# Patient Record
Sex: Male | Born: 1975 | Race: Black or African American | Hispanic: No | Marital: Single | Smoking: Current every day smoker
Health system: Southern US, Community
[De-identification: ages and names within clinical notes are randomized; demographics above are authoritative.]

## PROBLEM LIST (undated history)

## (undated) ENCOUNTER — Emergency Department (HOSPITAL_COMMUNITY): Discharge status: Absent without leave | Payer: Self-pay

## (undated) ENCOUNTER — Emergency Department (HOSPITAL_COMMUNITY): Admission: EM | Payer: Self-pay | Source: Home / Self Care

## (undated) DIAGNOSIS — Z5900 Homelessness unspecified: Secondary | ICD-10-CM

## (undated) DIAGNOSIS — F419 Anxiety disorder, unspecified: Secondary | ICD-10-CM

## (undated) DIAGNOSIS — F1410 Cocaine abuse, uncomplicated: Secondary | ICD-10-CM

## (undated) DIAGNOSIS — Z720 Tobacco use: Secondary | ICD-10-CM

## (undated) DIAGNOSIS — G43909 Migraine, unspecified, not intractable, without status migrainosus: Secondary | ICD-10-CM

---

## 2011-05-10 ENCOUNTER — Encounter (HOSPITAL_COMMUNITY): Payer: Self-pay | Admitting: *Deleted

## 2011-05-10 ENCOUNTER — Other Ambulatory Visit: Payer: Self-pay

## 2011-05-10 ENCOUNTER — Emergency Department (HOSPITAL_COMMUNITY)
Admission: EM | Admit: 2011-05-10 | Discharge: 2011-05-10 | Disposition: A | Discharge status: Home / Self Care | Payer: Self-pay | Attending: Emergency Medicine | Admitting: Emergency Medicine

## 2011-05-10 DIAGNOSIS — R079 Chest pain, unspecified: Secondary | ICD-10-CM | POA: Insufficient documentation

## 2011-05-10 DIAGNOSIS — F411 Generalized anxiety disorder: Secondary | ICD-10-CM | POA: Insufficient documentation

## 2011-05-10 DIAGNOSIS — J45909 Unspecified asthma, uncomplicated: Secondary | ICD-10-CM | POA: Insufficient documentation

## 2011-05-10 DIAGNOSIS — F419 Anxiety disorder, unspecified: Secondary | ICD-10-CM

## 2011-05-10 HISTORY — DX: Anxiety disorder, unspecified: F41.9

## 2011-05-10 MED ORDER — NAPROXEN 500 MG PO TABS: 500.0000 mg | ORAL_TABLET | Freq: Two times a day (BID) | ORAL | Status: DC

## 2011-05-10 NOTE — ED Notes (Signed)
G[facility]15 PEERL- pain 8/10

## 2011-05-10 NOTE — ED Notes (Signed)
Bed:WA25<BR> Expected date:<BR> Expected time:<BR> Means of arrival:<BR> Comments:<BR>
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
## 2011-05-10 NOTE — ED Notes (Signed)
Patient states he feels better. Patient requested for note for work . No noted or verbal distress at this time.

## 2011-05-10 NOTE — Discharge Instructions (Signed)
 RESOURCE GUIDE  Dental Problems  Patients with Medicaid: [facility]                     [facility] [address]                                           [address] Phone:  [phone]                                                  Phone:  [phone]  If unable to pay or uninsured, contact:  [facility] or [facility] to become qualified for the adult dental clinic.  Chronic Pain Problems Contact [facility]  [phone] Patients need to be referred by their primary care doctor.  Insufficient Money for Medicine Contact [employer]:  call "211" or [facility] 271-5999.  No Primary Care Doctor Call [facility]  832-8000 Other agencies that provide inexpensive medical care    [facility]  [phone]    [facility]  [phone]    [facility]  [phone]    [facility]  [phone]    [facility]  [phone]    [facility]  [phone]  Psychological Services [facility]  [phone] [facility]  [phone] [facility]   800 853-5163 (emergency services [phone])  Substance Abuse Resources [facility]  336-882-2125 [facility] 336-784-9470 [facility] 336-285-9073 [facility] 336-845-3988 Residential & Outpatient Substance Abuse Program  800-659-3381  Abuse/Neglect [place] Child Abuse Hotline (336) 641-3795 [place] Child Abuse Hotline 800-378-5315 (After Hours)  Emergency Shelter [facility] (336) 271-5985  [facility] (336) 275-9566 [facility] (704) 372-4663  MRSA Hotline #:   832-7006    [place] [facility]  [facility] [place]     [employer]                          [facility] [address] [place]                       [address]      [address]                                                  [place]                            [place] Phone:  [phone]                                   Phone:  [phone]                 Phone:  [phone]  [facility] Phone:    [phone]  [place] Child Abuse Hotline (336) 342-1394 (336) 342-3537 (After Hours)   

## 2011-08-14 ENCOUNTER — Encounter (HOSPITAL_COMMUNITY): Payer: Self-pay | Admitting: Emergency Medicine

## 2011-08-14 ENCOUNTER — Emergency Department (HOSPITAL_COMMUNITY)
Admission: EM | Admit: 2011-08-14 | Discharge: 2011-08-14 | Disposition: A | Discharge status: Home / Self Care | Payer: Self-pay | Attending: Emergency Medicine | Admitting: Emergency Medicine

## 2011-08-14 DIAGNOSIS — L84 Corns and callosities: Secondary | ICD-10-CM | POA: Insufficient documentation

## 2011-08-14 DIAGNOSIS — M79609 Pain in unspecified limb: Secondary | ICD-10-CM | POA: Insufficient documentation

## 2011-08-14 DIAGNOSIS — B351 Tinea unguium: Secondary | ICD-10-CM | POA: Insufficient documentation

## 2011-08-14 DIAGNOSIS — B353 Tinea pedis: Secondary | ICD-10-CM | POA: Insufficient documentation

## 2011-08-14 MED ORDER — MICONAZOLE NITRATE 2 % EX CREA: TOPICAL_CREAM | Freq: Two times a day (BID) | CUTANEOUS | Status: DC

## 2011-08-14 MED ORDER — TRAMADOL HCL 50 MG PO TABS
50.0000 mg | ORAL_TABLET | Freq: Once | ORAL | Status: AC
  Administered 2011-08-14: 50 mg via ORAL
  Filled 2011-08-14: qty 1

## 2011-08-14 NOTE — ED Notes (Signed)
Pt alert, nad, c/o foot pain, ? Athletes feet, onset unknown, ambulates to triage, steady gait noted, skin pwd

## 2011-08-14 NOTE — ED Notes (Signed)
MD at bedside. 

## 2011-08-14 NOTE — ED Provider Notes (Signed)
History     CSN: [id number]  Arrival date & time 08/14/11  0008   First MD Initiated Contact with Patient 08/14/11 0136      Chief Complaint  Patient presents with  . Foot Pain    (Consider location/radiation/quality/duration/timing/severity/associated sxs/prior treatment) Patient is a 36 y.o. male presenting with lower extremity pain. The history is provided by the patient. No language interpreter was used.  Foot Pain This is a new problem. The current episode started more than 1 week ago. The problem occurs constantly. The problem has not changed since onset.Pertinent negatives include no chest pain, no abdominal pain, no headaches and no shortness of breath. Nothing aggravates the symptoms. He has tried nothing for the symptoms. The treatment provided no relief.  Pt callouses on the feet and toenail fungus and skin cracking works on feet.    Past Medical History  Diagnosis Date  . Anxiety   . Asthma     History reviewed. No pertinent past surgical history.  No family history on file.  History  Substance Use Topics  . Smoking status: Not on file  . Smokeless tobacco: Not on file  . Alcohol Use: No      Review of Systems  Respiratory: Negative for shortness of breath.   Cardiovascular: Negative for chest pain.  Gastrointestinal: Negative for abdominal pain.  Skin: Negative for rash.  Neurological: Negative for headaches.  All other systems reviewed and are negative.    Allergies  Review of patient's allergies indicates no known allergies.  Home Medications   Current Outpatient Rx  Name Route Sig Dispense Refill  . ACETAMINOPHEN 500 MG PO TABS Oral Take 500 mg by mouth every 6 (six) hours as needed.      BP 122/91  Pulse 64  Temp 98 F (36.7 C) (Oral)  Resp 16  Wt 155 lb (70.308 kg)  SpO2 99%  Physical Exam  Constitutional: He is oriented to person, place, and time. He appears well-developed and well-nourished.  HENT:  Head: Normocephalic and  atraumatic.  Mouth/Throat: Oropharynx is clear and moist.  Eyes: Conjunctivae are normal. Pupils are equal, round, and reactive to light.  Neck: Normal range of motion. Neck supple.  Cardiovascular: Normal rate and regular rhythm.   Pulmonary/Chest: Effort normal and breath sounds normal.  Abdominal: Soft. Bowel sounds are normal. There is no tenderness. There is no rebound and no guarding.  Musculoskeletal: Normal range of motion.       2+ dorsalis pedis Bm FROM of B feet, B feet sensation intact  Neurological: He is alert and oriented to person, place, and time.  Skin: Skin is warm and dry.       Toenail fungus on all toes and callus on B plantar feet.  Tinea pedis between toes  Psychiatric: He has a normal mood and affect.    ED Course  Procedures (including critical care time)  Labs Reviewed - No data to display No results found.   No diagnosis found.    MDM  Follow up with podiatry for ongoing care.  Patient verbalizes understanding and agrees to follow up       Brax Walen Smitty Cords, MD 08/14/11 0150

## 2011-09-07 ENCOUNTER — Encounter (HOSPITAL_COMMUNITY): Payer: Self-pay | Admitting: *Deleted

## 2011-09-07 ENCOUNTER — Emergency Department (HOSPITAL_COMMUNITY)
Admission: EM | Admit: 2011-09-07 | Discharge: 2011-09-08 | Disposition: A | Discharge status: Home / Self Care | Payer: Self-pay | Attending: Emergency Medicine | Admitting: Emergency Medicine

## 2011-09-07 DIAGNOSIS — M79609 Pain in unspecified limb: Secondary | ICD-10-CM | POA: Insufficient documentation

## 2011-09-07 DIAGNOSIS — M79606 Pain in leg, unspecified: Secondary | ICD-10-CM

## 2011-09-07 NOTE — ED Notes (Signed)
Pt sts that he has had surgery on his legs.  

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
## 2011-09-08 MED ORDER — KETOROLAC TROMETHAMINE 60 MG/2ML IM SOLN
60.0000 mg | Freq: Once | INTRAMUSCULAR | Status: DC
  Filled 2011-09-08: qty 2

## 2011-09-08 MED ORDER — NAPROXEN 375 MG PO TABS: 375.0000 mg | ORAL_TABLET | Freq: Two times a day (BID) | ORAL | Status: DC

## 2011-09-08 NOTE — ED Provider Notes (Signed)
Medical screening examination/treatment/procedure(s) were conducted as a shared visit with non-physician practitioner(s) and myself.  I personally evaluated the patient during the encounter  Right calf tenderness without tenseness, erythema or warmth.  Hanley Seamen, MD 09/08/11 0120

## 2011-09-08 NOTE — ED Provider Notes (Signed)
History     CSN: [id number]  Arrival date & time 09/07/11  1949   First MD Initiated Contact with Patient 09/08/11 0023      Chief Complaint  Patient presents with  . Leg Pain    (Consider location/radiation/quality/duration/timing/severity/associated sxs/prior treatment) HPI Comments: Patient presents emergency department with chief complaint of right leg pain.  He's and not a reliable historian and states that he has had surgery on that leg but is unsure what type of surgery and for what.  He is also on aware of when the pain began stating once that it began today acutely and then later her that it began a couple of days ago.  He originally described the pain as severe and later as crampy.  Patient originally denied any injury or trauma and later stated that he felt a popping in his legs but is unable to state when the popping occurred and then again denied any popping injury in the same paragraph. Pt has no other complaints at this time.   Patient is a 36 y.o. male presenting with leg pain. The history is provided by the patient.  Leg Pain     Past Medical History  Diagnosis Date  . Anxiety   . Asthma     History reviewed. No pertinent past surgical history.  No family history on file.  History  Substance Use Topics  . Smoking status: Not on file  . Smokeless tobacco: Not on file  . Alcohol Use: No      Review of Systems  All other systems reviewed and are negative.    Allergies  Review of patient's allergies indicates no known allergies.  Home Medications   Current Outpatient Rx  Name Route Sig Dispense Refill  . ACETAMINOPHEN 500 MG PO TABS Oral Take 500 mg by mouth every 6 (six) hours as needed.      BP 126/85  Pulse 49  Resp 22  Ht 5\' 7"  (1.702 m)  Wt 140 lb (63.504 kg)  BMI 21.93 kg/m2  SpO2 100%  Physical Exam  Nursing note and vitals reviewed. Constitutional: He is oriented to person, place, and time. He appears well-developed and  well-nourished. No distress.  HENT:  Head: Normocephalic and atraumatic.  Eyes: Conjunctivae and EOM are normal.  Neck: Normal range of motion.  Cardiovascular:       Intact distal pulses.  No edema.  Pulmonary/Chest: Effort normal.  Musculoskeletal: Normal range of motion.       Homans sign negative.  Mild tenderness to palpation of the popliteal area.  Patient ambulates without difficulty.  Neurological: He is alert and oriented to person, place, and time.  Skin: Skin is warm and dry. No rash noted. He is not diaphoretic.       No warmth erythema or swelling of lower extremity.  Psychiatric: He has a normal mood and affect. His behavior is normal.    ED Course  Procedures (including critical care time)  Labs Reviewed - No data to display No results found.   No diagnosis found.    MDM  Leg pain  Patient presents emergency department with chief complaint of right lower extremity pain, however he is a unreliable historian and it is unclear as to what type of pain he is having.  Patient treated in the emergency department with anti-inflammatories advised to followup with primary care physician in regards to symptoms.  Patient's presentation is not concerning for DVT.  Patient extremity is not swollen, no pitting edema,  no warmth or erythema.  Full range of motion intact.  Patient able to ambulate in emergency department.  Return precautions discussed.        Jaci Carrel, [place] 09/08/11 0116

## 2012-04-25 ENCOUNTER — Encounter (HOSPITAL_COMMUNITY): Payer: Self-pay | Admitting: *Deleted

## 2012-04-25 ENCOUNTER — Emergency Department (HOSPITAL_COMMUNITY)
Admission: EM | Admit: 2012-04-25 | Discharge: 2012-04-25 | Disposition: A | Discharge status: Home / Self Care | Payer: Self-pay | Attending: Emergency Medicine | Admitting: Emergency Medicine

## 2012-04-25 DIAGNOSIS — X31XXXA Exposure to excessive natural cold, initial encounter: Secondary | ICD-10-CM | POA: Insufficient documentation

## 2012-04-25 DIAGNOSIS — T69029A Immersion foot, unspecified foot, initial encounter: Secondary | ICD-10-CM | POA: Insufficient documentation

## 2012-04-25 DIAGNOSIS — Z5900 Homelessness unspecified: Secondary | ICD-10-CM | POA: Insufficient documentation

## 2012-04-25 DIAGNOSIS — J45909 Unspecified asthma, uncomplicated: Secondary | ICD-10-CM | POA: Insufficient documentation

## 2012-04-25 DIAGNOSIS — Z8659 Personal history of other mental and behavioral disorders: Secondary | ICD-10-CM | POA: Insufficient documentation

## 2012-04-25 DIAGNOSIS — Y939 Activity, unspecified: Secondary | ICD-10-CM | POA: Insufficient documentation

## 2012-04-25 DIAGNOSIS — Y929 Unspecified place or not applicable: Secondary | ICD-10-CM | POA: Insufficient documentation

## 2012-04-25 NOTE — ED Provider Notes (Signed)
History     CSN: [id number]  Arrival date & time 04/25/12  0006   First MD Initiated Contact with Patient 04/25/12 0430      Chief Complaint  Patient presents with  . Foot Pain   HPI  History provided by the patient. Patient is 37 year old male currently homeless who presents with complaints of bilateral feet pain and swelling. Patient reports having increasing pain and swelling to his feet and toes for the past few days. Patient states they he really wishes he had his tennis shoes to wear but has been wearing boots. He states he has been trying to keep his feet dry but has not really been taking much care of them and has been walking continuously. He complains of pain to the bottoms of his feet and to his toes. He has not used any treatment for his symptoms. Denies any other aggravating or alleviating factors. Denies any associated red streaks or swelling of the foot. No fever, chills or sweats. Denies any other associated symptoms. Pain is described as moderate.    Past Medical History  Diagnosis Date  . Anxiety   . Asthma     History reviewed. No pertinent past surgical history.  No family history on file.  History  Substance Use Topics  . Smoking status: Not on file  . Smokeless tobacco: Not on file  . Alcohol Use: No      Review of Systems  Constitutional: Negative for fever and chills.  Neurological: Negative for weakness and numbness.  All other systems reviewed and are negative.    Allergies  Review of patient's allergies indicates no known allergies.  Home Medications  No current outpatient prescriptions on file.  BP 112/82  Pulse 52  Temp(Src) 97.9 F (36.6 C) (Oral)  Resp 18  SpO2 100%  Physical Exam  Nursing note and vitals reviewed. Constitutional: He is oriented to person, place, and time. He appears well-developed and well-nourished. No distress.  HENT:  Head: Normocephalic.  Cardiovascular: Normal rate and regular rhythm.    Pulmonary/Chest: Effort normal and breath sounds normal.  Abdominal: Soft.  Musculoskeletal:  Mild erythema to the toes diffusely on both feet. There is moderate swelling as well. Skin is macerated and tender to palpation. There is no spreading erythema to the foot. No erythematous streaks. Normal pulses. Normal sensation to light touch.  Neurological: He is alert and oriented to person, place, and time.  Skin: Skin is warm.  Psychiatric: He has a normal mood and affect. His behavior is normal.    ED Course  Procedures       1. Trench feet, unspecified laterality, initial encounter       MDM  4:30 AM patient seen and evaluated. Patient resting and sleeping comfortable in bed. He does not appear in any acute distress.        Angus Seller, PA-C 04/25/12 2046

## 2012-04-25 NOTE — ED Notes (Signed)
Pt states has a sore right foot from walking; no obvious injury

## 2013-09-18 ENCOUNTER — Emergency Department (HOSPITAL_COMMUNITY)
Admission: EM | Admit: 2013-09-18 | Discharge: 2013-09-19 | Disposition: A | Discharge status: Home / Self Care | Payer: Self-pay | Attending: Emergency Medicine | Admitting: Emergency Medicine

## 2013-09-18 ENCOUNTER — Encounter (HOSPITAL_COMMUNITY): Payer: Self-pay | Admitting: Emergency Medicine

## 2013-09-18 DIAGNOSIS — R112 Nausea with vomiting, unspecified: Secondary | ICD-10-CM | POA: Insufficient documentation

## 2013-09-18 DIAGNOSIS — R109 Unspecified abdominal pain: Secondary | ICD-10-CM | POA: Insufficient documentation

## 2013-09-18 DIAGNOSIS — F17200 Nicotine dependence, unspecified, uncomplicated: Secondary | ICD-10-CM | POA: Insufficient documentation

## 2013-09-18 DIAGNOSIS — J45909 Unspecified asthma, uncomplicated: Secondary | ICD-10-CM | POA: Insufficient documentation

## 2013-09-18 DIAGNOSIS — Z8659 Personal history of other mental and behavioral disorders: Secondary | ICD-10-CM | POA: Insufficient documentation

## 2013-09-18 DIAGNOSIS — R61 Generalized hyperhidrosis: Secondary | ICD-10-CM | POA: Insufficient documentation

## 2013-09-18 DIAGNOSIS — R1084 Generalized abdominal pain: Secondary | ICD-10-CM | POA: Insufficient documentation

## 2013-09-18 MED ORDER — SODIUM CHLORIDE 0.9 % IV BOLUS (SEPSIS): 1000.0000 mL | Freq: Once | INTRAVENOUS | Status: DC

## 2013-09-18 MED ORDER — ONDANSETRON 4 MG PO TBDP
4.0000 mg | ORAL_TABLET | Freq: Once | ORAL | Status: AC
  Administered 2013-09-19: 4 mg via ORAL
  Filled 2013-09-18: qty 1

## 2013-09-18 MED ORDER — MORPHINE SULFATE 4 MG/ML IJ SOLN
4.0000 mg | Freq: Once | INTRAMUSCULAR | Status: DC
  Filled 2013-09-18: qty 1

## 2013-09-18 MED ORDER — DICYCLOMINE HCL 20 MG PO TABS
10.0000 mg | ORAL_TABLET | Freq: Once | ORAL | Status: AC
  Administered 2013-09-19: 10 mg via ORAL
  Filled 2013-09-18: qty 1

## 2013-09-18 MED ORDER — ONDANSETRON HCL 4 MG/2ML IJ SOLN
4.0000 mg | Freq: Once | INTRAMUSCULAR | Status: DC
  Filled 2013-09-18: qty 2
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
## 2013-09-18 NOTE — ED Notes (Signed)
Refused blood draw. Dr. Ranae PalmsYelverton notified.

## 2013-09-18 NOTE — ED Notes (Signed)
Pt states he has abd pain and vomiting since about noon today   Pt states he thinks he ate something bad

## 2013-09-19 MED ORDER — ONDANSETRON 4 MG PO TBDP: ORAL_TABLET | ORAL | Status: DC

## 2013-09-19 MED ORDER — DICYCLOMINE HCL 20 MG PO TABS: 20.0000 mg | ORAL_TABLET | Freq: Two times a day (BID) | ORAL | Status: DC | PRN

## 2013-09-19 NOTE — Discharge Instructions (Signed)

## 2013-10-19 ENCOUNTER — Encounter (HOSPITAL_COMMUNITY): Payer: Self-pay | Admitting: Emergency Medicine

## 2013-10-19 ENCOUNTER — Emergency Department (HOSPITAL_COMMUNITY)
Admission: EM | Admit: 2013-10-19 | Discharge: 2013-10-19 | Disposition: A | Discharge status: Home / Self Care | Payer: Self-pay | Attending: Emergency Medicine | Admitting: Emergency Medicine

## 2013-10-19 DIAGNOSIS — F17200 Nicotine dependence, unspecified, uncomplicated: Secondary | ICD-10-CM | POA: Insufficient documentation

## 2013-10-19 DIAGNOSIS — R110 Nausea: Secondary | ICD-10-CM | POA: Insufficient documentation

## 2013-10-19 DIAGNOSIS — Z8659 Personal history of other mental and behavioral disorders: Secondary | ICD-10-CM | POA: Insufficient documentation

## 2013-10-19 DIAGNOSIS — J45909 Unspecified asthma, uncomplicated: Secondary | ICD-10-CM | POA: Insufficient documentation

## 2013-10-19 DIAGNOSIS — R42 Dizziness and giddiness: Secondary | ICD-10-CM | POA: Insufficient documentation

## 2013-10-19 DIAGNOSIS — R1084 Generalized abdominal pain: Secondary | ICD-10-CM | POA: Insufficient documentation

## 2013-10-19 LAB — CBC WITH DIFFERENTIAL/PLATELET
BASOS PCT: 0 % (ref 0–1)
Basophils Absolute: 0 10*3/uL (ref 0.0–0.1)
EOS ABS: 0.2 10*3/uL (ref 0.0–0.7)
EOS PCT: 3 % (ref 0–5)
HEMATOCRIT: 43.7 % (ref 39.0–52.0)
HEMOGLOBIN: 14.3 g/dL (ref 13.0–17.0)
LYMPHS ABS: 1.9 10*3/uL (ref 0.7–4.0)
LYMPHS PCT: 34 % (ref 12–46)
MCH: 28.7 pg (ref 26.0–34.0)
MCHC: 32.7 g/dL (ref 30.0–36.0)
MCV: 87.6 fL (ref 78.0–100.0)
Monocytes Absolute: 0.7 10*3/uL (ref 0.1–1.0)
Monocytes Relative: 12 % (ref 3–12)
NEUTROS ABS: 2.9 10*3/uL (ref 1.7–7.7)
Neutrophils Relative %: 51 % (ref 43–77)
Platelets: 249 10*3/uL (ref 150–400)
RBC: 4.99 MIL/uL (ref 4.22–5.81)
RDW: 13.7 % (ref 11.5–15.5)
WBC: 5.7 10*3/uL (ref 4.0–10.5)

## 2013-10-19 LAB — COMPREHENSIVE METABOLIC PANEL
ALBUMIN: 4.2 g/dL (ref 3.5–5.2)
ALT: 11 U/L (ref 0–53)
ANION GAP: 12 (ref 5–15)
AST: 16 U/L (ref 0–37)
Alkaline Phosphatase: 73 U/L (ref 39–117)
BUN: 11 mg/dL (ref 6–23)
CALCIUM: 9.5 mg/dL (ref 8.4–10.5)
CO2: 26 mEq/L (ref 19–32)
Chloride: 104 mEq/L (ref 96–112)
Creatinine, Ser: 0.91 mg/dL (ref 0.50–1.35)
GFR calc non Af Amer: >90 mL/min (ref >90)
GLUCOSE: 86 mg/dL (ref 70–99)
Potassium: 4.2 mEq/L (ref 3.7–5.3)
SODIUM: 142 meq/L (ref 137–147)
TOTAL PROTEIN: 7.5 g/dL (ref 6.0–8.3)
Total Bilirubin: 0.7 mg/dL (ref 0.3–1.2)

## 2013-10-19 LAB — URINE MICROSCOPIC-ADD ON

## 2013-10-19 LAB — URINALYSIS, ROUTINE W REFLEX MICROSCOPIC
Bilirubin Urine: NEGATIVE
Glucose, UA: NEGATIVE mg/dL
Hgb urine dipstick: NEGATIVE
Ketones, ur: NEGATIVE mg/dL
Nitrite: NEGATIVE
PROTEIN: NEGATIVE mg/dL
SPECIFIC GRAVITY, URINE: 1.022 (ref 1.005–1.030)
UROBILINOGEN UA: 1 mg/dL (ref 0.0–1.0)
pH: 6 (ref 5.0–8.0)

## 2013-10-19 LAB — LIPASE, BLOOD: Lipase: 18 U/L (ref 11–59)

## 2013-10-19 MED ORDER — SODIUM CHLORIDE 0.9 % IV BOLUS (SEPSIS)
1000.0000 mL | Freq: Once | INTRAVENOUS | Status: AC
  Administered 2013-10-19: 1000 mL via INTRAVENOUS

## 2013-10-19 MED ORDER — ONDANSETRON HCL 4 MG/2ML IJ SOLN
4.0000 mg | Freq: Once | INTRAMUSCULAR | Status: DC
  Filled 2013-10-19: qty 2

## 2013-10-19 NOTE — ED Notes (Signed)
Pt states he has been incarcerated for the past 30 days and just got out yesterday  Pt states he just hasnt been feeling good and wants to be checked out  Pt states he is having dizziness and intermittent abd pain with some nausea  Denies vomiting or diarrhea

## 2013-10-19 NOTE — ED Notes (Addendum)
Patient states "Since I got out, I just don't feel right." Patient relays vague symptoms of abd discomfort and being a little dizzy. Patient affect flat. Patient further expands to state that he is now homeless and "struggling". Patient states he attempted to go to a homeless shelter last night but was turned away and placed on a waiting list. Patient denies being familiar with [facility] and resources available. Patient denies nausea, states he is hungry, that all he has eaten since "he got out" was a couple bags of chips. Patient states he is not eating do to lack of resources.

## 2013-10-19 NOTE — ED Provider Notes (Signed)
CSN: [id number]     Arrival date & time 10/19/13  0531 History   First MD Initiated Contact with Patient 10/19/13 0557     Chief Complaint  Patient presents with  . Dizziness  . Abdominal Pain     (Consider location/radiation/quality/duration/timing/severity/associated sxs/prior Treatment) HPI Mr Langille is an 38 year old male with past medical history of anxiety and asthma who presents the ER today with nausea, and "not feeling well". Patient states he was just released from incarceration yesterday morning and since then has felt slightly nauseated, and states he "would just like to be checked out". Patient states he tried to eat some chips yesterday, which made him more nauseated. He states he has had low oral intake since yesterday and feels slightly "lightheaded". Patient denies any vomiting, fever, chills, shortness of breath, chest pain, weakness, numbness, tingling, syncope.  Past Medical History  Diagnosis Date  . Anxiety   . Asthma    History reviewed. No pertinent past surgical history. History reviewed. No pertinent family history. History  Substance Use Topics  . Smoking status: Current Every Day Smoker    Types: Cigarettes  . Smokeless tobacco: Not on file  . Alcohol Use: No    Review of Systems  Constitutional: Negative for fever.  HENT: Negative for trouble swallowing.   Eyes: Negative for visual disturbance.  Respiratory: Negative for shortness of breath.   Cardiovascular: Negative for chest pain.  Gastrointestinal: Positive for nausea and abdominal pain. Negative for vomiting.  Genitourinary: Negative for dysuria.  Musculoskeletal: Negative for neck pain.  Skin: Negative for rash.  Neurological: Negative for dizziness, weakness and numbness.  Psychiatric/Behavioral: Negative.       Allergies  Review of patient's allergies indicates no known allergies.  Home Medications   Prior to Admission medications   Not on File   BP 120/68  Pulse 68   Temp(Src) 98.8 F (37.1 C) (Oral)  Resp 16  Ht  (1.753 m)  Wt 150 lb (68.04 kg)  BMI 22.14 kg/m2  SpO2 99% Physical Exam  Nursing note and vitals reviewed. Constitutional: He is oriented to person, place, and time. He appears well-developed and well-nourished. No distress.  HENT:  Head: Normocephalic and atraumatic.  Mouth/Throat: Oropharynx is clear and moist. No oropharyngeal exudate.  Eyes: Right eye exhibits no discharge. Left eye exhibits no discharge. No scleral icterus.  Neck: Normal range of motion.  Cardiovascular: Normal rate, regular rhythm and normal heart sounds.   No murmur heard. Pulmonary/Chest: Effort normal and breath sounds normal. No respiratory distress.  Abdominal: Soft. Normal appearance and bowel sounds are normal. There is generalized tenderness. There is no rigidity, no guarding, no tenderness at McBurney's point and negative Murphy's sign.  Musculoskeletal: Normal range of motion. He exhibits no edema and no tenderness.  Neurological: He is alert and oriented to person, place, and time. No cranial nerve deficit. Coordination normal.  Skin: Skin is warm and dry. No rash noted. He is not diaphoretic.  Psychiatric: He has a normal mood and affect.    ED Course  Procedures (including critical care time) Labs Review Labs Reviewed  URINALYSIS, ROUTINE W REFLEX MICROSCOPIC - Abnormal; Notable for the following:    Leukocytes, UA MODERATE (*)    All other components within normal limits  URINE CULTURE  CBC WITH DIFFERENTIAL  COMPREHENSIVE METABOLIC PANEL  LIPASE, BLOOD  URINE MICROSCOPIC-ADD ON    Imaging Review No results found.   EKG Interpretation   Date/Time:  Friday October 19 2013 06:43:45  EDT Ventricular Rate:  59 PR Interval:  162 QRS Duration: 82 QT Interval:  425 QTC Calculation: 421 R Axis:   85 Text Interpretation:  Sinus rhythm Atrial premature complex RSR' in V1 or  V2, probably normal variant ST elev, probable normal early  repol pattern  Confirmed by Rhunette Croft, MD, Janey Genta [phone]) on 10/19/2013 7:39:19 AM      MDM   Final diagnoses:  Nausea alone    Patient with one day of nausea, generalized abdominal pain after being released from incarceration yesterday. Workup for abdominal etiology, possible gastroenteritis, pancreatitis, symptomatic treatment  7:00 AM: Patient stating he is no longer nauseous, and now he is hungry. We will hold Zofran.  10:25 AM: Patient's labs unremarkable. UA remarkable for moderate leukocytes only. Patient continues to deny dysuria at any time. We'll send urine culture. Patient tolerating food and drink well at this time, and is asymptomatic. We will discharge patient and have him followup with his primary care physician. I encouraged patient to call or return to the ER should his symptoms continue, return, or should he have any questions or concerns.  BP 120/68  Pulse 68  Temp(Src) 98.8 F (37.1 C) (Oral)  Resp 16  Ht  (1.753 m)  Wt 150 lb (68.04 kg)  BMI 22.14 kg/m2  SpO2 99%   Signed,  Ladona Mow, PA-C 6:58 PM   This patient seen and discussed with Dr. Derwood Kaplan, MD  Monte Fantasia, PA-C 10/19/13 1857  Monte Fantasia, PA-C 10/19/13 1859

## 2013-10-19 NOTE — Discharge Instructions (Signed)
Continue to drink plenty of fluids on daily basis, and eat regular meals. Return to the ER if symptoms persist, worsen or should you have any questions or concerns. Your urine was sent for culture, and those results will be related to you if positive for anything.  Nausea, Adult Nausea is the feeling that you have an upset stomach or have to vomit. Nausea by itself is not likely a serious concern, but it may be an early sign of more serious medical problems. As nausea gets worse, it can lead to vomiting. If vomiting develops, there is the risk of dehydration.  CAUSES   Viral infections.  Food poisoning.  Medicines.  Pregnancy.  Motion sickness.  Migraine headaches.  Emotional distress.  Severe pain from any source.  Alcohol intoxication. HOME CARE INSTRUCTIONS  Get plenty of rest.  Ask your caregiver about specific rehydration instructions.  Eat small amounts of food and sip liquids more often.  Take all medicines as told by your caregiver. SEEK MEDICAL CARE IF:  You have not improved after 2 days, or you get worse.  You have a headache. SEEK IMMEDIATE MEDICAL CARE IF:   You have a fever.  You faint.  You keep vomiting or have blood in your vomit.  You are extremely weak or dehydrated.  You have dark or bloody stools.  You have severe chest or abdominal pain. MAKE SURE YOU:  Understand these instructions.  Will watch your condition.  Will get help right away if you are not doing well or get worse. Document Released: 02/26/2004 Document Revised: 10/13/2011 Document Reviewed: 09/30/2010 [facility] Patient Information 2015 [place], [place]. This information is not intended to replace advice given to you by your health care provider. Make sure you discuss any questions you have with your health care provider.

## 2013-10-19 NOTE — Progress Notes (Signed)
[facility] Community Liaison Stacy, ° °Provided pt with a list of self-pay providers to help patient establish primary care.  °

## 2013-10-19 NOTE — ED Notes (Signed)
Entered patient room. Patient still wearing clothes from home, patient was requested to undress and change into hospital gown. Patient responded by stating "I don't feel safe", when asked to clarify patient states "I don't feel safe being out at night like this." Patient would not respond further. Patient was again requested to change into hospital gown.

## 2013-10-19 NOTE — ED Notes (Signed)
Patient aware we need urine sample.  

## 2013-10-19 NOTE — ED Notes (Signed)
Pt has cell phone and clothing with him. Also has wallet.

## 2013-10-19 NOTE — ED Notes (Signed)
Reminded pt of urine sample again

## 2013-10-19 NOTE — ED Notes (Addendum)
Made pt aware his of urine sample, and left urinal at bedside.

## 2013-10-20 LAB — URINE CULTURE: Colony Count: 4000

## 2013-10-20 NOTE — ED Provider Notes (Signed)
Medical screening examination/treatment/procedure(s) were conducted as a shared visit with non-physician practitioner(s) and myself.  I personally evaluated the patient during the encounter.   EKG Interpretation   Date/Time:  Friday October 19 2013 06:43:45 EDT Ventricular Rate:  59 PR Interval:  162 QRS Duration: 82 QT Interval:  425 QTC Calculation: 421 R Axis:   85 Text Interpretation:  Sinus rhythm Atrial premature complex RSR' in V1 or  V2, probably normal variant ST elev, probable normal early repol pattern  Confirmed by Amiliana Foutz, MD, Freddy Kinne [phone]) on 10/19/2013 7:39:19 AM      Abd pain, nausea cc. Exam - no peritoneal signs. Plan - control sx, nausea, and po challenge.  Derwood Kaplan, MD 10/20/13 [phone]

## 2013-11-03 ENCOUNTER — Emergency Department (HOSPITAL_COMMUNITY): Payer: Self-pay

## 2013-11-03 ENCOUNTER — Emergency Department (HOSPITAL_COMMUNITY)
Admission: EM | Admit: 2013-11-03 | Discharge: 2013-11-03 | Disposition: A | Discharge status: Home / Self Care | Payer: Self-pay | Attending: Emergency Medicine | Admitting: Emergency Medicine

## 2013-11-03 ENCOUNTER — Encounter (HOSPITAL_COMMUNITY): Payer: Self-pay | Admitting: Emergency Medicine

## 2013-11-03 DIAGNOSIS — R55 Syncope and collapse: Secondary | ICD-10-CM | POA: Insufficient documentation

## 2013-11-03 DIAGNOSIS — Z79899 Other long term (current) drug therapy: Secondary | ICD-10-CM | POA: Insufficient documentation

## 2013-11-03 DIAGNOSIS — Z720 Tobacco use: Secondary | ICD-10-CM | POA: Insufficient documentation

## 2013-11-03 DIAGNOSIS — J029 Acute pharyngitis, unspecified: Secondary | ICD-10-CM | POA: Insufficient documentation

## 2013-11-03 DIAGNOSIS — R0602 Shortness of breath: Secondary | ICD-10-CM

## 2013-11-03 DIAGNOSIS — J45901 Unspecified asthma with (acute) exacerbation: Secondary | ICD-10-CM | POA: Insufficient documentation

## 2013-11-03 DIAGNOSIS — Z8659 Personal history of other mental and behavioral disorders: Secondary | ICD-10-CM | POA: Insufficient documentation

## 2013-11-03 NOTE — ED Provider Notes (Signed)
CSN: [id number]     Arrival date & time 11/03/13  0223 History   First MD Initiated Contact with Patient 11/03/13 0535     Chief Complaint  Patient presents with  . Shortness of Breath     Patient is a 37 y.o. male presenting with shortness of breath. The history is provided by the patient.  Shortness of Breath Severity:  Moderate Progression:  Resolved Chronicity:  New Relieved by:  None tried Worsened by:  Nothing tried Associated symptoms: sore throat and syncope    Pt presents for multiple complaints:  He initially reported to nursing his asthma was "Acting up" and he needed an "inhaler" He reported he wanted to be admitted  When I Evaluated patient, he reports he has had sore throat, he feels short of breath and he reports he "passed out"  He is unable to give any further details.  He denies any trauma from this episode.  He is not sure when he passed out or what caused it.  When asked about his shortness of breath he does not give any further details.    He denies CP He does not report any other complaints Past Medical History  Diagnosis Date  . Anxiety   . Asthma    History reviewed. No pertinent past surgical history. No family history on file. History  Substance Use Topics  . Smoking status: Current Every Day Smoker    Types: Cigarettes  . Smokeless tobacco: Not on file  . Alcohol Use: No    Review of Systems  HENT: Positive for sore throat.   Respiratory: Positive for shortness of breath.   Cardiovascular: Positive for syncope.  Neurological: Positive for syncope.      Allergies  Review of patient's allergies indicates no known allergies.  Home Medications   Prior to Admission medications   Medication Sig Start Date End Date Taking? Authorizing Provider  albuterol (PROVENTIL HFA;VENTOLIN HFA) 108 (90 BASE) MCG/ACT inhaler Inhale 1-2 puffs into the lungs every 6 (six) hours as needed for wheezing or shortness of breath.   Yes Historical Provider, MD    BP 106/63  Pulse 55  Temp(Src) 97.8 F (36.6 C) (Oral)  Resp 15  Ht 5\' 7"  (1.702 m)  Wt 145 lb (65.772 kg)  BMI 22.71 kg/m2  SpO2 99% Physical Exam CONSTITUTIONAL: pt sleeping when I enter the room.  He is easily arousable and answers all questions and follows commands HEAD: Normocephalic/atraumatic EYES: EOMI/PERRL ENMT: Mucous membranes moist, uvula midline NECK: supple no meningeal signs SPINE:entire spine nontender CV: S1/S2 noted, no murmurs/rubs/gallops noted LUNGS: Lungs are clear to auscultation bilaterally, no apparent distress ABDOMEN: soft, nontender, no rebound or guarding GU:no cva tenderness NEURO: Pt is sleeping on arrival but arousable, moves all extremitiesx4, no arm or leg drift, no obvious facial droop EXTREMITIES: pulses normal, full ROM SKIN: warm, color normal PSYCH: no abnormalities of mood noted  ED Course  Procedures   6:27 AM Pt here with multiple complaints, with varying story depending on health care provider who is speaking to patient.  For his SOB - no hypoxia, CXR clear and no distress noted (sleeping on my evaluation) I doubt ACS/PE at this time  For syncope, EKG unchanged from prior and he does not appear to have any signs of dysrhythmia.   Appropriate for d/c home Imaging Review Dg Chest Portable 1 View  11/03/2013   CLINICAL DATA:  Asthma is acting up.  Needs an inhaler.  EXAM: PORTABLE CHEST - 1  VIEW  COMPARISON:  None.  FINDINGS: Slightly shallow inspiration. The heart size and mediastinal contours are within normal limits. Both lungs are clear. The visualized skeletal structures are unremarkable.  IMPRESSION: No active disease.   Electronically Signed   By: Burman NievesWilliam  Stevens M.D.   On: 11/03/2013 05:52      Date: 11/03/2013 0549am  Rate: 53  Rhythm: normal sinus rhythm  QRS Axis: normal  Intervals: normal  ST/T Wave abnormalities: early repolarization  Conduction Disutrbances:none  Narrative Interpretation:   Old EKG Reviewed:  unchanged from recent previous EKG   MDM   Final diagnoses:  Shortness of breath  Syncope, unspecified syncope type    Nursing notes including past medical history and social history reviewed and considered in documentation xrays reviewed and considered Previous records reviewed and considered      Joya Gaskinsonald W Phat Dalton, MD 11/03/13 0630

## 2013-11-03 NOTE — ED Notes (Signed)
The pt arrived by ptar ambulatory.  He is c/o his asthma acting up.  He needs an inhaler.  He is homeless and he wants to be admitted

## 2013-11-03 NOTE — Discharge Instructions (Signed)
Driving and Equipment Restrictions °Some medical problems make it dangerous to drive, ride a bike, or use machines. Some of these problems are: °· A hard blow to the head (concussion). °· Passing out (fainting). °· Twitching and shaking (seizures). °· Low blood sugar. °· Taking medicine to help you relax (sedatives). °· Taking pain medicines. °· Wearing an eye patch. °· Wearing splints. This can make it hard to use parts of your body that you need to drive safely. °HOME CARE  °· Do not drive until your doctor says it is okay. °· Do not use machines until your doctor says it is okay. °You may need a form signed by your doctor (medical release) before you can drive again. You may also need this form before you do other tasks where you need to be fully alert. °MAKE SURE YOU: °· Understand these instructions. °· Will watch your condition. °· Will get help right away if you are not doing well or get worse. °Document Released: 02/26/2004 Document Revised: 04/12/2011 Document Reviewed: 05/28/2009 °ExitCare® Patient Information ©2015 [employer]. This information is not intended to replace advice given to you by your health care provider. Make sure you discuss any questions you have with your health care provider. ° °

## 2013-11-04 ENCOUNTER — Encounter (HOSPITAL_COMMUNITY): Payer: Self-pay | Admitting: Emergency Medicine

## 2013-11-04 ENCOUNTER — Emergency Department (HOSPITAL_COMMUNITY)
Admission: EM | Admit: 2013-11-04 | Discharge: 2013-11-05 | Discharge status: Against Medical Advice | Payer: Self-pay | Attending: Emergency Medicine | Admitting: Emergency Medicine

## 2013-11-04 DIAGNOSIS — Z79899 Other long term (current) drug therapy: Secondary | ICD-10-CM | POA: Insufficient documentation

## 2013-11-04 DIAGNOSIS — Z8659 Personal history of other mental and behavioral disorders: Secondary | ICD-10-CM | POA: Insufficient documentation

## 2013-11-04 DIAGNOSIS — R1084 Generalized abdominal pain: Secondary | ICD-10-CM | POA: Insufficient documentation

## 2013-11-04 DIAGNOSIS — Z720 Tobacco use: Secondary | ICD-10-CM | POA: Insufficient documentation

## 2013-11-04 DIAGNOSIS — J45909 Unspecified asthma, uncomplicated: Secondary | ICD-10-CM | POA: Insufficient documentation

## 2013-11-04 MED ORDER — ACETAMINOPHEN 325 MG PO TABS
325.0000 mg | ORAL_TABLET | Freq: Once | ORAL | Status: AC
  Administered 2013-11-04: 325 mg via ORAL
  Filled 2013-11-04: qty 1

## 2013-11-04 MED ORDER — ONDANSETRON 4 MG PO TBDP
8.0000 mg | ORAL_TABLET | Freq: Once | ORAL | Status: AC
  Administered 2013-11-04: 8 mg via ORAL
  Filled 2013-11-04: qty 2

## 2013-11-04 NOTE — Discharge Instructions (Signed)
You were recommended to have lab work to further evaluate your abdominal pain, which you refused.  You may return to the ER at any time for worsening of your abdominal pain or any new concerns.   Pain of Unknown Etiology (Pain Without a Known Cause) You have come to your caregiver because of pain. Pain can occur in any part of the body. Often there is not a definite cause. If your laboratory (blood or urine) work was normal and X-rays or other studies were normal, your caregiver may treat you without knowing the cause of the pain. An example of this is the headache. Most headaches are diagnosed by taking a history. This means your caregiver asks you questions about your headaches. Your caregiver determines a treatment based on your answers. Usually testing done for headaches is normal. Often testing is not done unless there is no response to medications. Regardless of where your pain is located today, you can be given medications to make you comfortable. If no physical cause of pain can be found, most cases of pain will gradually leave as suddenly as they came.  If you have a painful condition and no reason can be found for the pain, it is important that you follow up with your caregiver. If the pain becomes worse or does not go away, it may be necessary to repeat tests and look further for a possible cause.  Only take over-the-counter or prescription medicines for pain, discomfort, or fever as directed by your caregiver.  For the protection of your privacy, test results cannot be given over the phone. Make sure you receive the results of your test. Ask how these results are to be obtained if you have not been informed. It is your responsibility to obtain your test results.  You may continue all activities unless the activities cause more pain. When the pain lessens, it is important to gradually resume normal activities. Resume activities by beginning slowly and gradually increasing the intensity and  duration of the activities or exercise. During periods of severe pain, bed rest may be helpful. Lie or sit in any position that is comfortable.  Ice used for acute (sudden) conditions may be effective. Use a large plastic bag filled with ice and wrapped in a towel. This may provide pain relief.  See your caregiver for continued problems. Your caregiver can help or refer you for exercises or physical therapy if necessary. If you were given medications for your condition, do not drive, operate machinery or power tools, or sign legal documents for 24 hours. Do not drink alcohol, take sleeping pills, or take other medications that may interfere with treatment. See your caregiver immediately if you have pain that is becoming worse and not relieved by medications. Document Released: 10/13/2000 Document Revised: 11/08/2012 Document Reviewed: 01/18/2005 [facility] Patient Information 2015 [place], [place]. This information is not intended to replace advice given to you by your health care provider. Make sure you discuss any questions you have with your health care provider.  Abdominal Pain Many things can cause abdominal pain. Usually, abdominal pain is not caused by a disease and will improve without treatment. It can often be observed and treated at home. Your health care provider will do a physical exam and possibly order blood tests and X-rays to help determine the seriousness of your pain. However, in many cases, more time must pass before a clear cause of the pain can be found. Before that point, your health care provider may not know if  you need more testing or further treatment. HOME CARE INSTRUCTIONS  Monitor your abdominal pain for any changes. The following actions may help to alleviate any discomfort you are experiencing:  Only take over-the-counter or prescription medicines as directed by your health care provider.  Do not take laxatives unless directed to do so by your health care  provider.  Try a clear liquid diet (broth, tea, or water) as directed by your health care provider. Slowly move to a bland diet as tolerated. SEEK MEDICAL CARE IF:  You have unexplained abdominal pain.  You have abdominal pain associated with nausea or diarrhea.  You have pain when you urinate or have a bowel movement.  You experience abdominal pain that wakes you in the night.  You have abdominal pain that is worsened or improved by eating food.  You have abdominal pain that is worsened with eating fatty foods.  You have a fever. SEEK IMMEDIATE MEDICAL CARE IF:   Your pain does not go away within 2 hours.  You keep throwing up (vomiting).  Your pain is felt only in portions of the abdomen, such as the right side or the left lower portion of the abdomen.  You pass bloody or black tarry stools. MAKE SURE YOU:  Understand these instructions.   Will watch your condition.   Will get help right away if you are not doing well or get worse.  Document Released: 10/28/2004 Document Revised: 01/23/2013 Document Reviewed: 09/27/2012 [facility] Patient Information 2015 [place], [place]. This information is not intended to replace advice given to you by your health care provider. Make sure you discuss any questions you have with your health care provider.

## 2013-11-04 NOTE — ED Provider Notes (Signed)
CSN: [id number]     Arrival date & time 11/04/13  2158 History   First MD Initiated Contact with Patient 11/04/13 2236     Chief Complaint  Patient presents with  . Generalized Body Aches     (Consider location/radiation/quality/duration/timing/severity/associated sxs/prior Treatment) HPI Pt presents to the ER with complaint of body aches and abdominal pain.  Pt reports onset of symptoms yesterday.  No fever, chills, vomiting, diarrhea or constipation.  Pt reports mild nausea.  Pt was seen in the ED yesterday morning with vague complaints.  Per nursing staff, pt has been sitting in the waiting room watching football for the past few hours, checked in when told by security he could not sit in the lobby.  Pt reported to nursing staff he had body aches and requested tylenol.  Pt did not mention body aches to me initially, only abdominal pain.  Pt reports pain is in lower abdomen, R>L Past Medical History  Diagnosis Date  . Anxiety   . Asthma    History reviewed. No pertinent past surgical history. No family history on file. History  Substance Use Topics  . Smoking status: Current Every Day Smoker    Types: Cigarettes  . Smokeless tobacco: Not on file  . Alcohol Use: No    Review of Systems  All other systems reviewed and are negative.     Allergies  Review of patient's allergies indicates no known allergies.  Home Medications   Prior to Admission medications   Medication Sig Start Date End Date Taking? Authorizing Provider  albuterol (PROVENTIL HFA;VENTOLIN HFA) 108 (90 BASE) MCG/ACT inhaler Inhale 1-2 puffs into the lungs every 6 (six) hours as needed for wheezing or shortness of breath.    Historical Provider, MD   BP 123/76  Pulse 52  Temp(Src) 97.5 F (36.4 C) (Oral)  Resp 18  SpO2 100% Physical Exam  Nursing note and vitals reviewed. Constitutional: He is oriented to person, place, and time. He appears well-developed and well-nourished.  HENT:  Head:  Normocephalic and atraumatic.  Nose: Nose normal.  Mouth/Throat: Oropharynx is clear and moist.  Eyes: Conjunctivae and EOM are normal. Pupils are equal, round, and reactive to light.  Neck: Normal range of motion. Neck supple. No JVD present. No tracheal deviation present. No thyromegaly present.  Cardiovascular: Normal rate, regular rhythm, normal heart sounds and intact distal pulses.  Exam reveals no gallop and no friction rub.   No murmur heard. Pulmonary/Chest: Effort normal and breath sounds normal. No stridor. No respiratory distress. He has no wheezes. He has no rales. He exhibits no tenderness.  Abdominal: Soft. Bowel sounds are normal. He exhibits no distension and no mass. There is tenderness (mild ttp over RLQ, suprapubic region, no rebound or guarding.). There is no rebound and no guarding.  Musculoskeletal: Normal range of motion. He exhibits no edema and no tenderness.  Lymphadenopathy:    He has no cervical adenopathy.  Neurological: He is alert and oriented to person, place, and time. He displays normal reflexes. He exhibits normal muscle tone. Coordination normal.  Skin: Skin is warm and dry. No rash noted. No erythema. No pallor.  Psychiatric: He has a normal mood and affect. His behavior is normal. Judgment and thought content normal.    ED Course  Procedures (including critical care time) Labs Review Labs Reviewed  CBC WITH DIFFERENTIAL  COMPREHENSIVE METABOLIC PANEL  LIPASE, BLOOD  ETHANOL  URINALYSIS, ROUTINE W REFLEX MICROSCOPIC    Imaging Review Dg Chest Portable 1  View  11/03/2013   CLINICAL DATA:  Asthma is acting up.  Needs an inhaler.  EXAM: PORTABLE CHEST - 1 VIEW  COMPARISON:  None.  FINDINGS: Slightly shallow inspiration. The heart size and mediastinal contours are within normal limits. Both lungs are clear. The visualized skeletal structures are unremarkable.  IMPRESSION: No active disease.   Electronically Signed   By: Burman Nieves M.D.   On:  11/03/2013 05:52     EKG Interpretation None      MDM   Final diagnoses:  Generalized abdominal pain    38 yo male with 1.5 days of abdominal pain.  Pt in no distress.  Plan for labs, zofran.  Pt refuses lab work, reports he had labs drawn recently.  Labs last done 9/18.  Explained to patient need for labs at this time given new onset of pain.  Pt initially agreed, but then refused to nursing staff, reported he just wanted tylenol and to leave.  Pt given return precautions, left AMA    Olivia Mackie, MD 11/04/13 2359

## 2013-11-04 NOTE — ED Notes (Addendum)
Pt refusing bloodwork stating he just had some last month. EDP and RN at bedside explaining risks and benefits of not having blood drawn. Pt alert and oriented x 4 and can repeat back the risks and benefits and he continues to refuse bloodowork. EDP aware.

## 2013-11-04 NOTE — ED Notes (Signed)
Pt reports generalized body aches for 1 day. No fever in triage. sts he just needs a tylenol or ibuprofen.

## 2013-11-05 ENCOUNTER — Emergency Department (HOSPITAL_COMMUNITY)
Admission: EM | Admit: 2013-11-05 | Discharge: 2013-11-05 | Disposition: A | Discharge status: Home / Self Care | Payer: Self-pay | Attending: Emergency Medicine | Admitting: Emergency Medicine

## 2013-11-05 ENCOUNTER — Encounter (HOSPITAL_COMMUNITY): Payer: Self-pay | Admitting: Emergency Medicine

## 2013-11-05 DIAGNOSIS — Z720 Tobacco use: Secondary | ICD-10-CM | POA: Insufficient documentation

## 2013-11-05 DIAGNOSIS — Z5900 Homelessness unspecified: Secondary | ICD-10-CM

## 2013-11-05 DIAGNOSIS — Z8659 Personal history of other mental and behavioral disorders: Secondary | ICD-10-CM | POA: Insufficient documentation

## 2013-11-05 DIAGNOSIS — M79604 Pain in right leg: Secondary | ICD-10-CM | POA: Insufficient documentation

## 2013-11-05 DIAGNOSIS — J45909 Unspecified asthma, uncomplicated: Secondary | ICD-10-CM | POA: Insufficient documentation

## 2013-11-05 DIAGNOSIS — Z79899 Other long term (current) drug therapy: Secondary | ICD-10-CM | POA: Insufficient documentation

## 2013-11-05 MED ORDER — IBUPROFEN 400 MG PO TABS
600.0000 mg | ORAL_TABLET | Freq: Once | ORAL | Status: AC
  Administered 2013-11-05: 600 mg via ORAL
  Filled 2013-11-05 (×2): qty 1

## 2013-11-05 NOTE — ED Notes (Signed)
Declined W/C at D/C and was escorted to lobby by RN. 

## 2013-11-05 NOTE — ED Provider Notes (Signed)
CSN: [id number]     Arrival date & time 11/05/13  2057 History   First MD Initiated Contact with Patient 11/05/13 2223     Chief Complaint  Patient presents with  . Leg Pain     (Consider location/radiation/quality/duration/timing/severity/associated sxs/prior Treatment) HPI Comments: This is the third ED visit in 3 days seen at the same time iin the evening  When confronted states he is homeless, has tried the shelters but are full and has no where to go.  He states he is waiting for his mail to catch up with him on Wednesday when he should receive food stands and a "check" He is als wait listed with social services for housing.  Tonight states he has been walking all day and now his right  leg hurts. Has not taken any medications, denies any injury    Patient is a 38 y.o. male presenting with leg pain. The history is provided by the patient.  Leg Pain Location:  Leg Injury: no   Leg location:  R lower leg Pain details:    Quality:  Dull   Radiates to:  Does not radiate   Severity:  Mild   Onset quality:  Gradual   Duration:  10 hours   Timing:  Constant   Progression:  Unchanged Chronicity:  New Dislocation: no   Foreign body present:  No foreign bodies Tetanus status:  Unknown Relieved by:  Rest Worsened by:  Activity Ineffective treatments:  None tried Associated symptoms: no fever, no muscle weakness, no numbness, no stiffness and no swelling     Past Medical History  Diagnosis Date  . Anxiety   . Asthma    History reviewed. No pertinent past surgical history. No family history on file. History  Substance Use Topics  . Smoking status: Current Every Day Smoker    Types: Cigarettes  . Smokeless tobacco: Not on file  . Alcohol Use: No    Review of Systems  Constitutional: Negative for fever.  Respiratory: Negative for shortness of breath.   Musculoskeletal: Negative for joint swelling and stiffness.  Skin: Negative for rash and wound.  Neurological: Negative  for weakness and numbness.  All other systems reviewed and are negative.     Allergies  Review of patient's allergies indicates no known allergies.  Home Medications   Prior to Admission medications   Medication Sig Start Date End Date Taking? Authorizing Provider  albuterol (PROVENTIL HFA;VENTOLIN HFA) 108 (90 BASE) MCG/ACT inhaler Inhale 1-2 puffs into the lungs every 6 (six) hours as needed for wheezing or shortness of breath.    Historical Provider, MD   BP 143/69  Pulse 70  Temp(Src) 98.9 F (37.2 C) (Oral)  Resp 14  Ht 5\' 7"  (1.702 m)  Wt 149 lb (67.586 kg)  BMI 23.33 kg/m2  SpO2 100% Physical Exam  Nursing note and vitals reviewed. Constitutional: He appears well-developed and well-nourished.  HENT:  Head: Normocephalic.  Eyes: Pupils are equal, round, and reactive to light.  Neck: Normal range of motion.  Pulmonary/Chest: Effort normal.  Musculoskeletal: Normal range of motion. He exhibits no edema and no tenderness.  Neurological: He is alert.  Skin: Skin is warm.    ED Course  Procedures (including critical care time) Labs Review Labs Reviewed - No data to display  Imaging Review No results found.   EKG Interpretation None     Social work unavailabe at this time given Pharmacist, hospital  MDM   Final diagnoses:  Homeless  Leg  pain, diffuse, right         Arman FilterGail K Marquel Pottenger, NP 11/05/13 2254

## 2013-11-05 NOTE — ED Notes (Signed)
Pt. reports right calf pain onset this morning , denies injury /ambulatory , pedal pulses present , pt. states he has been walking a lot.

## 2013-11-05 NOTE — Discharge Instructions (Signed)
°Emergency Department Resource Guide °1) Find a Doctor and Pay Out of Pocket °Although you won't have to find out who is covered by your insurance plan, it is a good idea to ask around and get recommendations. You will then need to call the office and see if the doctor you have chosen will accept you as a new patient and what types of options they offer for patients who are self-pay. Some doctors offer discounts or will set up payment plans for their patients who do not have insurance, but you will need to ask so you aren't surprised when you get to your appointment. ° °2) Contact Your Local Health Department °Not all health departments have doctors that can see patients for sick visits, but many do, so it is worth a call to see if yours does. If you don't know where your local health department is, you can check in your phone book. The CDC also has a tool to help you locate your state's health department, and many state websites also have listings of all of their local health departments. ° °3) Find a Walk-in Clinic °If your illness is not likely to be very severe or complicated, you may want to try a walk in clinic. These are popping up all over the country in pharmacies, drugstores, and shopping centers. They're usually staffed by nurse practitioners or physician assistants that have been trained to treat common illnesses and complaints. They're usually fairly quick and inexpensive. However, if you have serious medical issues or chronic medical problems, these are probably not your best option. ° °No Primary Care Doctor: °- Call [facility] at  832-8000 - they can help you locate a primary care doctor that  accepts your insurance, provides certain services, etc. °- Physician Referral Service- 1-800-533-3463 ° °Chronic Pain Problems: °Organization         Address  Phone   Notes  °[facility]  (336) 297-2271 Patients need to be referred by their primary care doctor.  ° °Medication  Assistance: °Organization         Address  Phone   Notes  °[place] [facility] [address] °[place], [place] [address] (336) 641-8030 --Must be a resident of [place] °-- Must have NO insurance coverage whatsoever (no Medicaid/ Medicare, etc.) °-- The pt. MUST have a primary care doctor that directs their care regularly and follows them in the community °  °[facility]  (866) 331-1348   °[employer]  (888) 892-1162   ° °Agencies that provide inexpensive medical care: °Organization         Address  Phone   Notes  °[facility]  (336) 832-8035   °[facility]    (336) 832-7272   °[facility] [address] °[place], [place] [address] (336) 832-4777   °[facility] [address], °[place] (336) 271-4999   °[facility]    (336) 373-0678   °[facility]    (336) 272-1050   °[facility] ° [address], [place] Phone:  (336) 832-4444, Fax:  (336) 832-4440 Hours of Operation:  9 am - 6 pm, M-F.  Also accepts Medicaid/Medicare and self-pay.  °[facility] ° [address], [place] Phone: (336) 832-3150, Fax: (336) 832-3151. Hours of Operation:  8:30 am - 5:30 pm, M-F.  Also accepts Medicaid and self-pay.  °[facility] [place] [address], [place] Phone: (336) 878-6027   °[facility] [address], [place], [place] (336)723-1848, Ext. 123 Mondays & Thursdays: 7-9 AM.  First 15 patients are seen on a first come, first serve basis. °  ° °Medicaid-accepting [place] Providers: ° °Organization         Address  Phone   Notes  °[facility] [address], [place] (336) 641-2100 Also accepts self-pay patients.  °[facility] [address], [place] ° (336) 856-9996   °[facility] [address], [place]  (336) 288-8857   °[facility] [address] [place] [address], [place] (336) 299-7000   °Veita Bland [address], [place]  ° (336) 373-1557 Only accepts [place] Access Medicaid patients after they have their name applied to their card.  ° °Self-Pay (no insurance) in [place]: ° °Organization         Address  Phone   Notes  °Sickle Cell Patients, [facility] [address], [place] (336) 832-1970   °[facility] [address], [place] (336) 832-4400   °[facility] ° [address], [place] (336) 992-4800   °[facility]/Dr. Osei-Bonsu ° [address] [place] [address], [place] or [address], [place] (336) 841-8500 Phone number for both [place] and [place] locations is the same.  °[facility] [address], [place] (336) 299-0000   °[facility] [address] [place] [address], [place] or [address] (336) 852-7530 °(336) 878-2260   °[facility] [address], [place] (336) 350-1642, phone; (336) 294-5005, fax Sees patients 1st and 3rd Saturday of every month.  Must not qualify for public or private insurance (i.e. Medicaid, Medicare, [place] Health Choice, Veterans' Benefits) • Household income should be no more than 200% of the poverty level •The clinic cannot treat you if you are pregnant or think you are pregnant • Sexually transmitted diseases are not treated at the clinic.  ° ° °Dental Care: °Organization         Address  Phone  Notes  °[place] [facility] [address], [place] (336) 641-6152 Accepts children up to age 21 who are enrolled in Medicaid or [place] Health Choice; pregnant women with a Medicaid card; and children who have applied for Medicaid or [place] Health Choice, but were declined, whose parents can pay a reduced fee at time of service.  °[place]  [facility] [place]  [address], [place] (336) 641-7733 Accepts children up to age 21 who are enrolled in Medicaid or [place] Health Choice; pregnant women with a Medicaid card; and children who have applied for Medicaid or  Health Choice, but were declined, whose parents can pay a reduced fee at time of service.  °[facility] ° [address],  (336) 641-4533 Patients are seen by appointment only. Walk-ins are not accepted. [facility] will see patients 18 years of age and older. °Monday - Tuesday (8am-5pm) °Most Wednesdays (8:30-5pm) °$30 per visit, cash only  °[facility] ° [address], [place] (336) 641-4533 Patients are seen by appointment only. Walk-ins are not accepted. [facility] will see patients 18 years of age and older. °One   Wednesday Evening (Monthly: Volunteer Based).  $30 per visit, cash only  °[facility]  (919) 537-3737 for adults; Children under age 4, call Graduate Pediatric Dentistry at (919) [phone]. Children aged 4-14, please call (919) [phone] to request a pediatric application. ° Dental services are provided in all areas of dental care including fillings, crowns and bridges, complete and partial dentures, implants, gum treatment, root canals, and extractions. Preventive care is also provided. Treatment is provided to both adults and children. °Patients are selected via a lottery and there is often a waiting list. °  °[facility] [address], °[place] ° (336) 763-8833 [email or web address] °  °[facility] [address], [place], [place] (336)723-1848, Ext. 123 Second and Fourth Thursday of each month, opens at 6:30 AM; Clinic ends at 9 AM.  Patients are seen on a first-come first-served basis, and a limited number are seen during each clinic.  ° °[facility] ° [address], [place], [place] (336) 723-7904    Eligibility Requirements °You must have lived in [place], [place], or [place] counties for at least the last three months. °  You cannot be eligible for state or federal sponsored healthcare insurance, including Veterans Administration, Medicaid, or Medicare. °  You generally cannot be eligible for healthcare insurance through your employer.  °  How to apply: °Eligibility screenings are held every Tuesday and Wednesday afternoon from 1:00 pm until 4:00 pm. You do not need an appointment for the interview!  °[facility] [address], [place], [place] 336-631-2330   °[facility]  336-342-8273   °[facility]  336-703-3100   °[facility]  336-570-6415   ° °Behavioral Health Resources in the Community: °Intensive Outpatient Programs °Organization         Address  Phone  Notes  °[facility] [address], [place], [place] 336-878-6098   °[facility] [address], [place], [place] 336-832-9800   °[facility] [address], [place], [place] ° 336-882-2125   °[facility] [address],  °[place], [place] 1-800-853-5163 or 336-641-4981   °Substance Abuse Resources °Organization         Address  Phone  Notes  °[facility]  336-882-2125   °[facility]  336-784-9470   °[facility]  336-285-9073   °[facility]  336-845-3988   °Residential & Outpatient Substance Abuse Program  1-800-659-3381   °Psychological Services °Organization         Address  Phone  Notes  °[facility]  336- 832-9600   °[facility]  336- 378-7881   °[facility] [address], [place] 1-800-853-5163 or 336-641-4981   ° °Mobile Crisis Teams °Organization         Address  Phone  Notes  °[facility], Mobile Crisis Care Unit  1-877-626-1772   °Assertive °[facility] ° [address]  [place], [place] 336-834-9664   °[facility] [address] °[place] 336-554-5454   ° °Self-Help/Support Groups °Organization         Address  Phone             Notes  °[facility] [place] - variety of support groups  336- 373-1402 Call for more information  °Narcotics Anonymous (NA), [facility] [address], °[place]  2 meetings at this location  ° °  Residential Treatment Programs °Organization         Address  Phone  Notes  °[facility] [address],    °[place]  1-866-801-8205   °[facility] ° [address], [place], [place] 704-293-8524   °[facility] [address], [place] 336-845-3988 Admissions: 8am-3pm M-F  °[facility] [address]    °[place], [place] 336-841-1104   °[facility] [address], [place], [place] 336-379-7146   °[facility] [address]  °[place], [place] 336-285-9073   °[facility] Intensive Outpatient [address], [place], [place] 336-852-3033   °[facility] [address]  °[place], [place] 1-877-615-2722 or 336-784-9470   °[facility] [address] [place], [place] 336-227-7417 Accepts Medicaid  °[facility] [address]  °[place] 1-800-659-3381 Substance Abuse/Addiction Treatment  ° °[place] Behavioral Health Resources °Organization         Address  Phone  Notes  °[facility]  (888) 581-9988   °Julie Brannon, PhD [address] [place], [place]   (336) 349-5553 or (336) 951-0000   °[facility]   [address] °[place], [place] (336) 349-4454   °[facility] [address], [place], [place] (336) 342-8316 Insurance/Medicaid/sponsorship through [facility]  °[facility] [address]                                    [place], [place] (336) 342-8316 Therapy/tele-psych/case    °[facility] [address]  ° [place],  (336) 349-2233    °Dr. Arfeen  (336) 349-4544   °[facility] [place]  [employer] [place] [facility] 1) [address], [place] °2) [address], [place] °3)  [address], [place] (336) 349-3220 °(336) 342-7768 ° °(336) 342-8140   °[place] [facility] (336) 342-1394 or (336) 342-3537 (After Hours)    ° ° °

## 2013-11-06 NOTE — ED Provider Notes (Signed)
Medical screening examination/treatment/procedure(s) were performed by non-physician practitioner and as supervising physician I was immediately available for consultation/collaboration.   EKG Interpretation None        David H Yao, MD 11/06/13 1228 

## 2013-11-07 ENCOUNTER — Emergency Department (HOSPITAL_COMMUNITY)
Admission: EM | Admit: 2013-11-07 | Discharge: 2013-11-07 | Disposition: A | Discharge status: Home / Self Care | Payer: Self-pay | Attending: Emergency Medicine | Admitting: Emergency Medicine

## 2013-11-07 ENCOUNTER — Encounter (HOSPITAL_COMMUNITY): Payer: Self-pay | Admitting: Emergency Medicine

## 2013-11-07 DIAGNOSIS — R519 Headache, unspecified: Secondary | ICD-10-CM

## 2013-11-07 DIAGNOSIS — Z720 Tobacco use: Secondary | ICD-10-CM | POA: Insufficient documentation

## 2013-11-07 DIAGNOSIS — J45909 Unspecified asthma, uncomplicated: Secondary | ICD-10-CM | POA: Insufficient documentation

## 2013-11-07 DIAGNOSIS — Z8659 Personal history of other mental and behavioral disorders: Secondary | ICD-10-CM | POA: Insufficient documentation

## 2013-11-07 DIAGNOSIS — R51 Headache: Secondary | ICD-10-CM | POA: Insufficient documentation

## 2013-11-07 MED ORDER — PROCHLORPERAZINE MALEATE 10 MG PO TABS
10.0000 mg | ORAL_TABLET | Freq: Once | ORAL | Status: AC
  Administered 2013-11-07: 10 mg via ORAL
  Filled 2013-11-07: qty 1

## 2013-11-07 MED ORDER — DIPHENHYDRAMINE HCL 25 MG PO CAPS
25.0000 mg | ORAL_CAPSULE | Freq: Once | ORAL | Status: AC
  Administered 2013-11-07: 25 mg via ORAL
  Filled 2013-11-07: qty 1

## 2013-11-07 MED ORDER — METOCLOPRAMIDE HCL 10 MG PO TABS
10.0000 mg | ORAL_TABLET | Freq: Once | ORAL | Status: AC
  Administered 2013-11-07: 10 mg via ORAL
  Filled 2013-11-07: qty 1

## 2013-11-07 NOTE — Discharge Instructions (Signed)
Please follow up with your primary care physician in 1-2 days. If you do not have one please call the [facility] number listed above. Please alternate between Motrin and Tylenol every three hours for pain.  °Please read all discharge instructions and return precautions.  ° °General Headache Without Cause °A headache is pain or discomfort felt around the head or neck area. The specific cause of a headache may not be found. There are many causes and types of headaches. A few common ones are: °· Tension headaches. °· Migraine headaches. °· Cluster headaches. °· Chronic daily headaches. °HOME CARE INSTRUCTIONS  °· Keep all follow-up appointments with your caregiver or any specialist referral. °· Only take over-the-counter or prescription medicines for pain or discomfort as directed by your caregiver. °· Lie down in a dark, quiet room when you have a headache. °· Keep a headache journal to find out what may trigger your migraine headaches. For example, write down: °¨ What you eat and drink. °¨ How much sleep you get. °¨ Any change to your diet or medicines. °· Try massage or other relaxation techniques. °· Put ice packs or heat on the head and neck. Use these 3 to 4 times per day for 15 to 20 minutes each time, or as needed. °· Limit stress. °· Sit up straight, and do not tense your muscles. °· Quit smoking if you smoke. °· Limit alcohol use. °· Decrease the amount of caffeine you drink, or stop drinking caffeine. °· Eat and sleep on a regular schedule. °· Get 7 to 9 hours of sleep, or as recommended by your caregiver. °· Keep lights dim if bright lights bother you and make your headaches worse. °SEEK MEDICAL CARE IF:  °· You have problems with the medicines you were prescribed. °· Your medicines are not working. °· You have a change from the usual headache. °· You have nausea or vomiting. °SEEK IMMEDIATE MEDICAL CARE IF:  °· Your headache becomes severe. °· You have a fever. °· You have a stiff  neck. °· You have loss of vision. °· You have muscular weakness or loss of muscle control. °· You start losing your balance or have trouble walking. °· You feel faint or pass out. °· You have severe symptoms that are different from your first symptoms. °MAKE SURE YOU:  °· Understand these instructions. °· Will watch your condition. °· Will get help right away if you are not doing well or get worse. °Document Released: 01/18/2005 Document Revised: 04/12/2011 Document Reviewed: 02/03/2011 °ExitCare® Patient Information ©2015 [employer]. This information is not intended to replace advice given to you by your health care provider. Make sure you discuss any questions you have with your health care provider. ° ° °

## 2013-11-07 NOTE — ED Notes (Signed)
Pt discussed how he is homeless. Given a second meal to go. Also requested a bus pas.

## 2013-11-07 NOTE — ED Provider Notes (Signed)
Medical screening examination/treatment/procedure(s) were performed by non-physician practitioner and as supervising physician I was immediately available for consultation/collaboration.   EKG Interpretation None        Chinita Schimpf, MD 11/07/13 2341 

## 2013-11-07 NOTE — ED Provider Notes (Signed)
CSN: [id number]     Arrival date & time 11/07/13  1829 History   First MD Initiated Contact with Patient 11/07/13 2013     Chief Complaint  Patient presents with  . Headache     (Consider location/radiation/quality/duration/timing/severity/associated sxs/prior Treatment) HPI Comments: Patient is a 38 year old male past medical history significant for anxiety, asthma, tobacco abuse presented to the emergency department for intermittent episodes of right-sided throbbing headache over the last 3 days. Patient states the last time he had a headache like this was approximately 7 days ago. Endorses some mild photophobia. No other associated symptoms. He is to take Tylenol and Motrin without any relief. Denies any head injury, falls, fever chills, nausea, vomiting, numbness or weakness, chest pain, shortness of breath, abdominal pain.  Patient is a 38 y.o. male presenting with headaches.  Headache   Past Medical History  Diagnosis Date  . Anxiety   . Asthma    History reviewed. No pertinent past surgical history. No family history on file. History  Substance Use Topics  . Smoking status: Current Every Day Smoker    Types: Cigarettes  . Smokeless tobacco: Not on file  . Alcohol Use: No    Review of Systems  Neurological: Positive for headaches.  All other systems reviewed and are negative.     Allergies  Review of patient's allergies indicates no known allergies.  Home Medications   Prior to Admission medications   Medication Sig Start Date End Date Taking? Authorizing Provider  acetaminophen (TYLENOL) 325 MG tablet Take 650 mg by mouth every 6 (six) hours as needed for mild pain.   Yes Historical Provider, MD   BP 121/78  Pulse 60  Temp(Src) 97.9 F (36.6 C) (Oral)  Resp 18  Ht 5\' 7"  (1.702 m)  Wt 150 lb (68.04 kg)  BMI 23.49 kg/m2  SpO2 100% Physical Exam  Nursing note and vitals reviewed. Constitutional: He is oriented to person, place, and time. He appears  well-developed and well-nourished. No distress.  HENT:  Head: Normocephalic and atraumatic.  Right Ear: External ear normal.  Left Ear: External ear normal.  Nose: Nose normal.  Mouth/Throat: Oropharynx is clear and moist. No oropharyngeal exudate.  Eyes: Conjunctivae and EOM are normal. Pupils are equal, round, and reactive to light.  Neck: Normal range of motion. Neck supple.  Cardiovascular: Normal rate, regular rhythm, normal heart sounds and intact distal pulses.   Pulmonary/Chest: Effort normal and breath sounds normal. No respiratory distress.  Abdominal: Soft. There is no tenderness.  Neurological: He is alert and oriented to person, place, and time. He has normal strength. No cranial nerve deficit. Gait normal. GCS eye subscore is 4. GCS verbal subscore is 5. GCS motor subscore is 6.  Sensation grossly intact.  No pronator drift.  Bilateral heel-knee-shin intact.  Skin: Skin is warm and dry. He is not diaphoretic.    ED Course  Procedures (including critical care time) Medications  diphenhydrAMINE (BENADRYL) capsule 25 mg (25 mg Oral Given 11/07/13 2119)  metoCLOPramide (REGLAN) tablet 10 mg (10 mg Oral Given 11/07/13 2118)  prochlorperazine (COMPAZINE) tablet 10 mg (10 mg Oral Given 11/07/13 2119)    Labs Review Labs Reviewed - No data to display  Imaging Review No results found.   EKG Interpretation None      MDM   Final diagnoses:  Headache disorder    Filed Vitals:   11/07/13 2100  BP: 121/78  Pulse: 60  Temp:   Resp:    Afebrile, NAD,  non-toxic appearing, AAOx4.   Pt HA treated and improved while in ED.  Presentation is like pts typical HA and non concerning for [facility], ICH, Meningitis, or temporal arteritis. Pt is afebrile with no focal neuro deficits, nuchal rigidity, or change in vision. Pt is to follow up with PCP to discuss prophylactic medication. Pt verbalizes understanding and is agreeable with plan to dc. Patient is stable at time of discharge.  Patient d/w with Dr. Manus Gunningancour, agrees with plan.        Jeannetta EllisJennifer L Hemi Chacko, PA-C 11/07/13 2336

## 2013-11-07 NOTE — ED Notes (Signed)
Sent note to pharmacy to please send meds 

## 2013-11-07 NOTE — ED Notes (Signed)
Pt reports that he gets migraines a lot, and it has been worse because it was hot outside today. Off and on for several days.

## 2013-11-07 NOTE — ED Notes (Signed)
The pt has a headache for 7 days.  He reports that he has a headache every other day.  No n or v

## 2013-11-08 ENCOUNTER — Encounter (HOSPITAL_COMMUNITY): Payer: Self-pay | Admitting: Emergency Medicine

## 2013-11-08 ENCOUNTER — Emergency Department (HOSPITAL_COMMUNITY)
Admission: EM | Admit: 2013-11-08 | Discharge: 2013-11-09 | Disposition: A | Discharge status: Home / Self Care | Payer: Self-pay | Attending: Emergency Medicine | Admitting: Emergency Medicine

## 2013-11-08 DIAGNOSIS — Y93B3 Activity, free weights: Secondary | ICD-10-CM | POA: Insufficient documentation

## 2013-11-08 DIAGNOSIS — S46901A Unspecified injury of unspecified muscle, fascia and tendon at shoulder and upper arm level, right arm, initial encounter: Secondary | ICD-10-CM | POA: Insufficient documentation

## 2013-11-08 DIAGNOSIS — Z8659 Personal history of other mental and behavioral disorders: Secondary | ICD-10-CM | POA: Insufficient documentation

## 2013-11-08 DIAGNOSIS — J45909 Unspecified asthma, uncomplicated: Secondary | ICD-10-CM | POA: Insufficient documentation

## 2013-11-08 DIAGNOSIS — Z720 Tobacco use: Secondary | ICD-10-CM | POA: Insufficient documentation

## 2013-11-08 DIAGNOSIS — X58XXXA Exposure to other specified factors, initial encounter: Secondary | ICD-10-CM | POA: Insufficient documentation

## 2013-11-08 DIAGNOSIS — M546 Pain in thoracic spine: Secondary | ICD-10-CM

## 2013-11-08 DIAGNOSIS — Y9289 Other specified places as the place of occurrence of the external cause: Secondary | ICD-10-CM | POA: Insufficient documentation

## 2013-11-08 DIAGNOSIS — T148XXA Other injury of unspecified body region, initial encounter: Secondary | ICD-10-CM

## 2013-11-08 DIAGNOSIS — S29002A Unspecified injury of muscle and tendon of back wall of thorax, initial encounter: Secondary | ICD-10-CM | POA: Insufficient documentation

## 2013-11-08 DIAGNOSIS — S46902A Unspecified injury of unspecified muscle, fascia and tendon at shoulder and upper arm level, left arm, initial encounter: Secondary | ICD-10-CM | POA: Insufficient documentation

## 2013-11-08 NOTE — ED Provider Notes (Signed)
CSN: [id number]     Arrival date & time 11/08/13  2205 History  This chart was scribed for non-physician practitioner Raymon Mutton, PA-C working with Dr. Pricilla Loveless by Conchita Paris, ED Scribe. This patient was seen in [facility]08C/[facility]08C and the patient's care was started at 11:03 PM.   Chief Complaint  Patient presents with  . Back Pain   The history is provided by the patient. No language interpreter was used.   HPI Comments: Dustin Norris is a 38 y.o. male with PMHx of anxiety and asthma presenting to the Emergency Department complaining of upper back pain that began today while doing curls, using weights of 80 pounds. Stated that during this time he did not hear a pop. Patient reported the pain as a throbbing and pulling sensation with motion - denied radiation of pain. Stated that he has been using extra strength Tylenol with minimal relief.  Denied neck pain, neck stiffness, blurred vision, sudden loss of vision, headache, dizziness, chest pain, shortness of breath, difficulty breathing, weakness, numbness, tingling. PCP none    Past Medical History  Diagnosis Date  . Anxiety   . Asthma    History reviewed. No pertinent past surgical history. History reviewed. No pertinent family history. History  Substance Use Topics  . Smoking status: Current Every Day Smoker    Types: Cigarettes  . Smokeless tobacco: Not on file  . Alcohol Use: No    Review of Systems  Eyes: Negative for visual disturbance.  Respiratory: Negative for shortness of breath.   Cardiovascular: Negative for chest pain.  Gastrointestinal: Negative for nausea and vomiting.  Musculoskeletal: Positive for arthralgias (bilateral shoulders) and back pain.  Neurological: Negative for numbness and headaches.      Allergies  Review of patient's allergies indicates no known allergies.  Home Medications   Prior to Admission medications   Not on File   BP 129/82  Pulse 72  Temp(Src) 98.7 F (37.1 C)  (Oral)  Resp 16  SpO2 100% Physical Exam  Nursing note and vitals reviewed. Constitutional: He is oriented to person, place, and time. He appears well-developed and well-nourished. No distress.  HENT:  Head: Normocephalic and atraumatic.  Mouth/Throat: Oropharynx is clear and moist. No oropharyngeal exudate.  Eyes: Conjunctivae and EOM are normal. Pupils are equal, round, and reactive to light. Right eye exhibits no discharge. Left eye exhibits no discharge.  Neck: Normal range of motion. Neck supple. No tracheal deviation present.  Negative neck stiffness Negative nuchal rigidity Negative cervical lymphadenopathy Negative meningeal signs  Cardiovascular: Normal rate, regular rhythm and normal heart sounds.   Pulses:      Radial pulses are 2+ on the right side, and 2+ on the left side.  Cap refill less than 3 seconds  Pulmonary/Chest: Effort normal and breath sounds normal. No respiratory distress. He has no wheezes. He has no rales.  Musculoskeletal: Normal range of motion. He exhibits tenderness. He exhibits no edema.       Back:  Negative deformities identified to the spine. Tenderness upon palpation- muscular nature. Most discomfort upon palpation to the musculature between the scapulas. Muscular discomfort upon palpation to the trapezius muscles bilaterally. Full ROM to upper and lower extremities without difficulty noted, negative ataxia noted.  Lymphadenopathy:    He has no cervical adenopathy.  Neurological: He is alert and oriented to person, place, and time. No cranial nerve deficit. He exhibits normal muscle tone. Coordination normal.  Cranial nerves III-XII grossly intact Strength 5+/5+ to upper extremities bilaterally  with resistance applied, equal distribution noted Sensation intact with differentiation to sharp and dull touch Equal grip strength bilaterally Negative facial droop Negative slurred speech Negative aphasia Negative arm drift Fine motor skills intact   Skin: Skin is warm and dry. No rash noted. He is not diaphoretic. No erythema.  Psychiatric: He has a normal mood and affect. His behavior is normal. Thought content normal.    ED Course  Procedures  DIAGNOSTIC STUDIES: Oxygen Saturation is 100% on room air, normal by my interpretation.    COORDINATION OF CARE: 11:11 PM Discussed treatment plan with pt at bedside and pt agreed to plan.   Labs Review Labs Reviewed - No data to display  Imaging Review Dg Thoracic Spine 2 View  11/09/2013   CLINICAL DATA:  Acute onset of right shoulder pain and upper back pain. Initial encounter.  EXAM: THORACIC SPINE - 2 VIEW  COMPARISON:  Chest radiograph performed 11/03/2013  FINDINGS: There is no evidence of fracture or subluxation. Vertebral bodies demonstrate normal height and alignment. Intervertebral disc spaces are preserved.  The visualized portions of both lungs are clear. The mediastinum is unremarkable in appearance.  IMPRESSION: No evidence of fracture or subluxation along the thoracic spine.   Electronically Signed   By: Roanna RaiderJeffery  Chang M.D.   On: 11/09/2013 01:13   Dg Shoulder Right  11/09/2013   CLINICAL DATA:  Acute onset of right shoulder pain and upper back pain. Initial encounter.  EXAM: RIGHT SHOULDER - 2+ VIEW  COMPARISON:  None.  FINDINGS: There is no evidence of fracture or dislocation. The right humeral head is seated within the glenoid fossa. The acromioclavicular joint is unremarkable in appearance. No significant soft tissue abnormalities are seen. The visualized portions of the right lung are clear.  IMPRESSION: No evidence of fracture or dislocation.   Electronically Signed   By: Roanna RaiderJeffery  Chang M.D.   On: 11/09/2013 01:12     EKG Interpretation None      MDM   Final diagnoses:  Thoracic back pain, unspecified back pain laterality  Muscle strain   Medications - No data to display  Filed Vitals:   11/08/13 2212  BP: 129/82  Pulse: 72  Temp: 98.7 F (37.1 C)   TempSrc: Oral  Resp: 16  SpO2: 100%   I personally performed the services described in this documentation, which was scribed in my presence. The recorded information has been reviewed and is accurate.  Patient presenting to emergency department with muscular pain after lifting weights earlier today-reported that he was performing biceps curls with weights weighing approximately 80 pounds each. Stated he's been feeling a pulling, soreness sensation localized to the trapezius muscles bilaterally and in between the scapulas bilaterally. Stated that he's been using extra strength Tylenol with minimal relief. Right shoulder plain film negative for acute osseous injury or dislocation. Thoracic plain film negative evidence for fracture subluxation-unremarkable findings. Negative deformities identified to the spine. Muscular discomfort upon palpation to the trapezius muscle and thoracic spine and paravertebral regions bilaterally. Full range of motion to the upper extremities bilaterally. Equal grip strength. Patient neurovascularly intact. Negative focal neurological deficits. Negative signs of trauma or injury. Suspicion to be muscular pain secondary to heavy lifting. Patient stable, afebrile. Patient not septic appearing. Discharged patient. Discussed with patient to rest, heat, massage. Discussed with patient to avoid heavy lifting for the next couple of days. [facility]. Discussed with patient to closely monitor symptoms and if symptoms are to  worsen or change to report back to the ED - strict return instructions given.  Patient agreed to plan of care, understood, all questions answered.   Raymon Mutton, PA-C 11/09/13 0139

## 2013-11-08 NOTE — ED Notes (Signed)
Pt states he has been working out and lifting weights; taking OTC tylenol and not helping. R shoulder and upper back pain. Otherwise healthy.

## 2013-11-09 ENCOUNTER — Emergency Department (HOSPITAL_COMMUNITY): Payer: Self-pay

## 2013-11-09 ENCOUNTER — Encounter (HOSPITAL_COMMUNITY): Payer: Self-pay | Admitting: Emergency Medicine

## 2013-11-09 DIAGNOSIS — Z720 Tobacco use: Secondary | ICD-10-CM | POA: Insufficient documentation

## 2013-11-09 DIAGNOSIS — G44221 Chronic tension-type headache, intractable: Secondary | ICD-10-CM | POA: Insufficient documentation

## 2013-11-09 DIAGNOSIS — Z590 Homelessness: Secondary | ICD-10-CM | POA: Insufficient documentation

## 2013-11-09 DIAGNOSIS — J45909 Unspecified asthma, uncomplicated: Secondary | ICD-10-CM | POA: Insufficient documentation

## 2013-11-09 DIAGNOSIS — Z8659 Personal history of other mental and behavioral disorders: Secondary | ICD-10-CM | POA: Insufficient documentation

## 2013-11-09 NOTE — ED Notes (Signed)
Pt given [place] sandwich, cup of coca cola at d/c. Pt A&Ox4, ambulatory with steady gait, NAD

## 2013-11-09 NOTE — Discharge Instructions (Signed)
Please call your doctor for a followup appointment within 24-48 hours. When you talk to your doctor please let them know that you were seen in the emergency department and have them acquire all of your records so that they can discuss the findings with you and formulate a treatment plan to fully care for your new and ongoing problems. Please call and set-up an appointment with Orthopedics Please avoid heavy lifting for at least one week Please apply heat and massage with icy hot ointment Please continue to monitor symptoms closely and if symptoms are to worsen or change (fever greater than 101, chills, sweating, nausea, vomiting, chest pain, shortness of breathe, difficulty breathing, weakness, numbness, tingling, worsening or changes to pain pattern, swelling, fall, injury) please report back to the Emergency Department immediately.   Muscle Strain A muscle strain is an injury that occurs when a muscle is stretched beyond its normal length. Usually a small number of muscle fibers are torn when this happens. Muscle strain is rated in degrees. First-degree strains have the least amount of muscle fiber tearing and pain. Second-degree and third-degree strains have increasingly more tearing and pain.  Usually, recovery from muscle strain takes 1-2 weeks. Complete healing takes 5-6 weeks.  CAUSES  Muscle strain happens when a sudden, violent force placed on a muscle stretches it too far. This may occur with lifting, sports, or a fall.  RISK FACTORS Muscle strain is especially common in athletes.  SIGNS AND SYMPTOMS At the site of the muscle strain, there may be:  Pain.  Bruising.  Swelling.  Difficulty using the muscle due to pain or lack of normal function. DIAGNOSIS  Your health care provider will perform a physical exam and ask about your medical history. TREATMENT  Often, the best treatment for a muscle strain is resting, icing, and applying cold compresses to the injured area.  HOME CARE  INSTRUCTIONS   Use the PRICE method of treatment to promote muscle healing during the first 2-3 days after your injury. The PRICE method involves:  Protecting the muscle from being injured again.  Restricting your activity and resting the injured body part.  Icing your injury. To do this, put ice in a plastic bag. Place a towel between your skin and the bag. Then, apply the ice and leave it on from 15-20 minutes each hour. After the third day, switch to moist heat packs.  Apply compression to the injured area with a splint or elastic bandage. Be careful not to wrap it too tightly. This may interfere with blood circulation or increase swelling.  Elevate the injured body part above the level of your heart as often as you can.  Only take over-the-counter or prescription medicines for pain, discomfort, or fever as directed by your health care provider.  Warming up prior to exercise helps to prevent future muscle strains. SEEK MEDICAL CARE IF:   You have increasing pain or swelling in the injured area.  You have numbness, tingling, or a significant loss of strength in the injured area. MAKE SURE YOU:   Understand these instructions.  Will watch your condition.  Will get help right away if you are not doing well or get worse. Document Released: 01/18/2005 Document Revised: 11/08/2012 Document Reviewed: 08/17/2012 [facility] Patient Information 2015 [place], [place]. This information is not intended to replace advice given to you by your health care provider. Make sure you discuss any questions you have with your health care provider.   Emergency Department Resource Guide 1) Find a  Doctor and Pay Out of Pocket Although you won't have to find out who is covered by your insurance plan, it is a good idea to ask around and get recommendations. You will then need to call the office and see if the doctor you have chosen will accept you as a new patient and what types of options they offer for  patients who are self-pay. Some doctors offer discounts or will set up payment plans for their patients who do not have insurance, but you will need to ask so you aren't surprised when you get to your appointment.  2) Contact Your Local Health Department Not all health departments have doctors that can see patients for sick visits, but many do, so it is worth a call to see if yours does. If you don't know where your local health department is, you can check in your phone book. The CDC also has a tool to help you locate your state's health department, and many state websites also have listings of all of their local health departments.  3) Find a Walk-in Clinic If your illness is not likely to be very severe or complicated, you may want to try a walk in clinic. These are popping up all over the country in pharmacies, drugstores, and shopping centers. They're usually staffed by nurse practitioners or physician assistants that have been trained to treat common illnesses and complaints. They're usually fairly quick and inexpensive. However, if you have serious medical issues or chronic medical problems, these are probably not your best option.  No Primary Care Doctor: - Call Health Connect at  [phone] - they can help you locate a primary care doctor that  accepts your insurance, provides certain services, etc. - Physician Referral Service- [phone]  Chronic Pain Problems: Organization         Address  Phone   Notes  [facility]  [phone] Patients need to be referred by their primary care doctor.   Medication Assistance: Organization         Address  Phone   Notes  [facility] Medication [facility] [address] [place], [place] [address] [phone] --Must be a resident of [facility] -- Must have NO insurance coverage whatsoever (no Medicaid/ Medicare, etc.) -- The pt. MUST have a primary care doctor that directs their care regularly  and follows them in the community   [facility]ist  [phone]   [employer]  [phone]    Agencies that provide inexpensive medical care: Organization         Address  Phone   Notes  [facility]  [phone]   [facility]    [phone]   [facility] [address] [place], [place] [address] [phone]   [facility] [address] [place] [place] [phone]   Planned Parenthood    [phone]   [facility]    [phone]   [facility]  [address], [place] Phone:  [phone], Fax:  [phone] Hours of Operation:  9 am - 6 pm, M-F.  Also accepts Medicaid/Medicare and self-pay.  [facility]  [address],  Phone: [phone], Fax: [phone]. Hours of Operation:  8:30 am - 5:30 pm, M-F.  Also accepts Medicaid and self-pay.  [facility] [place] [address], [employer] Phone: [phone])  [phone]   [facility] [address] [place], [place] [phone], Ext. [phone] Mondays & Thursdays: 7-9 AM.  First 15 patients are seen on a first come, first serve basis.    Medicaid-accepting [facility] Providers:  Organization         Address  Phone   Notes  [facility] [address], [place] [phone] Also accepts self-pay patients.  [facility] [address] Laurell Josephs [place], [place]  [phone]   [facility] [address], [place] [phone]   [facility] [address] [place] [phone]   Renaye Rakers [address], [place]   [phone] Only accepts [place] Access [place] patients after they have their name applied to their card.   Self-Pay (no insurance) in [facility]:  Organization         Address  Phone   Notes  Sickle  Cell Patients, [facility] Internal Medicine [address] [place], [place] [phone]   [facility] [address] [place], [place] [phone]   [facility] [place]  [address], [place] [phone]   [facility]/Dr. Osei-Bonsu  [address] [place] or [address], [place] [phone] Phone number for both [place] and [place] locations is the same.  Urgent Medical and [facility] [address] [place] [phone]   [facility] [address] [place] or [address] [phone]   [facility] [address] [place] [phone], phone; [phone], fax Sees patients 1st and 3rd Saturday of every month.  Must not qualify for public or private insurance (i.e. Medicaid, Medicare, [place] Health Choice, Veterans' Benefits)  Household income should be no more than 200% of the poverty level The clinic cannot treat you if you are pregnant or think you are pregnant  Sexually transmitted diseases are not treated at the clinic.    Dental Care: Organization         Address  Phone  Notes  [facility] Department of [facility] [address] [place], [place] [phone] Accepts children up to age 18 who are enrolled in [place] or [place] Health Choice; pregnant women with a Medicaid card; and children who have applied for Medicaid or [place] Health Choice, but were declined, whose parents can pay a reduced fee at time of service.  [facility] Department of [facility]  [address], [place] [phone] Accepts children up to age 34 who are enrolled in [place] or [place] Health Choice; pregnant women with a Medicaid card; and children who have applied for Medicaid or [place] Health Choice, but were declined, whose parents can pay a reduced fee at time of service.  [facility]  [address] [place], [place] [phone] Patients are seen by appointment only. Walk-ins are not accepted. [facility] will see patients 72 years of age and older. Monday - Tuesday (8am-5pm) Most Wednesdays (8:30-5pm) $30 per visit, cash only  [facility] Adult Dental Access PROGRAM  [address], [facility] [phone] Patients are seen by appointment only. Walk-ins are not accepted. [facility] will see patients 57 years of age and older. One Wednesday Evening (Monthly: Volunteer Based).  $  30 per visit, cash only  [employer] [facility]  [phone] for adults; Children under age 634, call Graduate Pediatric Dentistry at [phone]. Children aged 594-14, please call [phone] to request a pediatric application.  Dental services are provided in all areas of dental care including fillings, crowns and bridges, complete and partial dentures, implants, gum treatment, root canals, and extractions. Preventive care is also provided. Treatment is provided to both adults and children. Patients are selected via a lottery and there is often a waiting list.   [facility] [address], [place]  [phone]) [phone] [email or web address]   [facility] [address], [place], [place] [phone], Ext. [phone] Second and Fourth Thursday of each month, opens at 6:30 AM; Clinic ends at 9 AM.  Patients are seen on a first-come first-served basis, and a limited number are seen during each clinic.   [facility]  [address], [place], [place] [phone]) [phone]   Eligibility Requirements You must have lived in [place], [place], or [place] counties for at least the last three months.   You cannot be eligible for state or federal sponsored [place]healthcare insurance, including CIGNAVeterans Administration, [place]icaid, or [employer]Medicare.   You generally cannot be eligible for healthcare insurance through your employer.    How to apply: Eligibility  screenings are held every Tuesday and Wednesday afternoon from 1:00 pm until 4:00 pm. You do not need an appointment for the interview!  [facility] [address], [place], [place] [phone]   [facility]  [phone]   [facility]  [phone]   [facility]  [phone]    Behavioral Health Resources in the Community: Intensive Outpatient Programs Organization         Address  Phone  Notes  [facility] [address], [place], [place] [phone]   [facility] Outpatient [address], [place], [place] [phone]   [facility] [address], [place], [place]  [phone]   [facility] [address],  [place], [place] [phone] or [phone]   Substance Abuse Resources Organization         Address  Phone  Notes  [facility]  [phone]   [facility]  [phone]   [facility]  [phone]   [facility]  [phone]   Residential & Outpatient Substance Abuse Program  [phone]   Psychological Services Organization         Address  Phone  Notes  [facility]  [phone]- [phone]   [facility]  [phone]- [phone]   [facility] [address], [place] [phone] or [phone]    Mobile Crisis Teams Organization         Address  Phone  Notes  [facility], Mobile Crisis Care Unit  [phone]   [facility]  [address] [place], [place] [phone]   Doristine LocksSharon DeEsch [address] [place] [phone]    Self-Help/Support Groups Organization         Address  Phone             Notes  [facility] [place] - variety of support groups  336- [phone] Call for more information  Narcotics Anonymous (NA), [facility] [address], [place]  2 meetings at  this location   Statisticianesidential Treatment Programs Organization  Address  Phone  Notes  [facility] [address],    [place]  [phone]   [facility]  [address], [place] [phone], [place], [place] [phone]   [facility] [address], [place] [phone] Admissions: 8am-3pm M-F  [facility] [address]    [place], [place] [phone]   [facility] [address], [place], [place] [phone]   [facility] [address]  [place], [place] [phone]   [facility] - Intensive Outpatient [address], [place], [place] [phone]   [facility] ([facility] [address]  [place], [place] [phone] or [phone]   [facility] ([facility]) [address] [place], [place] [phone] Accepts Medicaid  [facility] [address]  [place] [phone] Substance Abuse/Addiction Treatment   [facility] Resources Organization         Address  Phone  Notes  [facility]  [phone]   Angie FavaJulie Brannon, PhD [address], [address] [place], [place]   [phone] or [phone]) [phone]   [facility]   [address] [place], [place] [phone]   [facility] [address], [place], [place] [phone] Insurance/Medicaid/sponsorship through [facility]  [facility] [address]                                    [place], [place] [phone] Therapy/tele-psych/case  [facility] [address].   [place], [place] [phone]) [phone]    Dr. Lolly MustacheArfeen  [phone](336) [phone]   [facility]  [employer] [facility] 1) [address], [place] 2) [address], [place] 3)  [address], [place] [phone]  [phone]   [facility] Child Abuse Hotline [phone]) [phone] or [phone])  [phone] (After Hours)

## 2013-11-09 NOTE — ED Provider Notes (Signed)
Medical screening examination/treatment/procedure(s) were performed by non-physician practitioner and as supervising physician I was immediately available for consultation/collaboration.   EKG Interpretation None        Audree CamelScott T Kamaile Zachow, MD 11/09/13 1646

## 2013-11-09 NOTE — ED Notes (Signed)
Pt. Reports headache for 2 days unrelieved by OTC pain medication , denies nausea or vomitting / no fever or chills.

## 2013-11-10 ENCOUNTER — Emergency Department (HOSPITAL_COMMUNITY)
Admission: EM | Admit: 2013-11-10 | Discharge: 2013-11-10 | Disposition: A | Discharge status: Home / Self Care | Payer: Self-pay | Attending: Emergency Medicine | Admitting: Emergency Medicine

## 2013-11-10 ENCOUNTER — Encounter (HOSPITAL_COMMUNITY): Payer: Self-pay | Admitting: Emergency Medicine

## 2013-11-10 DIAGNOSIS — Z720 Tobacco use: Secondary | ICD-10-CM | POA: Insufficient documentation

## 2013-11-10 DIAGNOSIS — R51 Headache: Secondary | ICD-10-CM | POA: Insufficient documentation

## 2013-11-10 DIAGNOSIS — R519 Headache, unspecified: Secondary | ICD-10-CM

## 2013-11-10 DIAGNOSIS — Z8659 Personal history of other mental and behavioral disorders: Secondary | ICD-10-CM | POA: Insufficient documentation

## 2013-11-10 DIAGNOSIS — G44221 Chronic tension-type headache, intractable: Secondary | ICD-10-CM

## 2013-11-10 DIAGNOSIS — J45909 Unspecified asthma, uncomplicated: Secondary | ICD-10-CM | POA: Insufficient documentation

## 2013-11-10 HISTORY — DX: Homelessness unspecified: Z59.00

## 2013-11-10 HISTORY — DX: Homelessness: Z59.0

## 2013-11-10 MED ORDER — METOCLOPRAMIDE HCL 5 MG/ML IJ SOLN
10.0000 mg | Freq: Once | INTRAMUSCULAR | Status: DC
  Filled 2013-11-10: qty 2

## 2013-11-10 MED ORDER — NAPROXEN 500 MG PO TABS: 500.0000 mg | ORAL_TABLET | Freq: Two times a day (BID) | ORAL | Status: DC

## 2013-11-10 MED ORDER — ACETAMINOPHEN 325 MG PO TABS
650.0000 mg | ORAL_TABLET | Freq: Once | ORAL | Status: AC
  Administered 2013-11-10: 650 mg via ORAL
  Filled 2013-11-10: qty 2

## 2013-11-10 MED ORDER — NAPROXEN 250 MG PO TABS
500.0000 mg | ORAL_TABLET | Freq: Once | ORAL | Status: AC
  Administered 2013-11-10: 500 mg via ORAL
  Filled 2013-11-10: qty 2

## 2013-11-10 NOTE — ED Notes (Signed)
Pt. reports chronic headache for several weeks , denies head injury , alert and oriented , no nausea or vomitting , seen here last night for the same complaint - discharged home .

## 2013-11-10 NOTE — ED Notes (Signed)
Pt reports headache that started today that was unrelieved by otc pain medication. Pt rates pain 7/10. A&O x 4.

## 2013-11-10 NOTE — Discharge Instructions (Signed)
Headaches, Frequently Asked Questions °MIGRAINE HEADACHES °Q: What is migraine? What causes it? How can I treat it? °A: Generally, migraine headaches begin as a dull ache. Then they develop into a constant, throbbing, and pulsating pain. You may experience pain at the temples. You may experience pain at the front or back of one or both sides of the head. The pain is usually accompanied by a combination of: °· Nausea. °· Vomiting. °· Sensitivity to light and noise. °Some people (about 15%) experience an aura (see below) before an attack. The cause of migraine is believed to be chemical reactions in the brain. Treatment for migraine may include over-the-counter or prescription medications. It may also include self-help techniques. These include relaxation training and biofeedback.  °Q: What is an aura? °A: About 15% of people with migraine get an "aura". This is a sign of neurological symptoms that occur before a migraine headache. You may see wavy or jagged lines, dots, or flashing lights. You might experience tunnel vision or blind spots in one or both eyes. The aura can include visual or auditory hallucinations (something imagined). It may include disruptions in smell (such as strange odors), taste or touch. Other symptoms include: °· Numbness. °· A "pins and needles" sensation. °· Difficulty in recalling or speaking the correct word. °These neurological events may last as long as 60 minutes. These symptoms will fade as the headache begins. °Q: What is a trigger? °A: Certain physical or environmental factors can lead to or "trigger" a migraine. These include: °· Foods. °· Hormonal changes. °· Weather. °· Stress. °It is important to remember that triggers are different for everyone. To help prevent migraine attacks, you need to figure out which triggers affect you. Keep a headache diary. This is a good way to track triggers. The diary will help you talk to your healthcare professional about your condition. °Q: Does  weather affect migraines? °A: Bright sunshine, hot, humid conditions, and drastic changes in barometric pressure may lead to, or "trigger," a migraine attack in some people. But studies have shown that weather does not act as a trigger for everyone with migraines. °Q: What is the link between migraine and hormones? °A: Hormones start and regulate many of your body's functions. Hormones keep your body in balance within a constantly changing environment. The levels of hormones in your body are unbalanced at times. Examples are during menstruation, pregnancy, or menopause. That can lead to a migraine attack. In fact, about three quarters of all women with migraine report that their attacks are related to the menstrual cycle.  °Q: Is there an increased risk of stroke for migraine sufferers? °A: The likelihood of a migraine attack causing a stroke is very remote. That is not to say that migraine sufferers cannot have a stroke associated with their migraines. In persons under age 40, the most common associated factor for stroke is migraine headache. But over the course of a person's normal life span, the occurrence of migraine headache may actually be associated with a reduced risk of dying from cerebrovascular disease due to stroke.  °Q: What are acute medications for migraine? °A: Acute medications are used to treat the pain of the headache after it has started. Examples over-the-counter medications, NSAIDs, ergots, and triptans.  °Q: What are the triptans? °A: Triptans are the newest class of abortive medications. They are specifically targeted to treat migraine. Triptans are vasoconstrictors. They moderate some chemical reactions in the brain. The triptans work on receptors in your brain. Triptans help   to restore the balance of a neurotransmitter called serotonin. Fluctuations in levels of serotonin are thought to be a main cause of migraine.  °Q: Are over-the-counter medications for migraine effective? °A:  Over-the-counter, or "OTC," medications may be effective in relieving mild to moderate pain and associated symptoms of migraine. But you should see your caregiver before beginning any treatment regimen for migraine.  °Q: What are preventive medications for migraine? °A: Preventive medications for migraine are sometimes referred to as "prophylactic" treatments. They are used to reduce the frequency, severity, and length of migraine attacks. Examples of preventive medications include antiepileptic medications, antidepressants, beta-blockers, calcium channel blockers, and NSAIDs (nonsteroidal anti-inflammatory drugs). °Q: Why are anticonvulsants used to treat migraine? °A: During the past few years, there has been an increased interest in antiepileptic drugs for the prevention of migraine. They are sometimes referred to as "anticonvulsants". Both epilepsy and migraine may be caused by similar reactions in the brain.  °Q: Why are antidepressants used to treat migraine? °A: Antidepressants are typically used to treat people with depression. They may reduce migraine frequency by regulating chemical levels, such as serotonin, in the brain.  °Q: What alternative therapies are used to treat migraine? °A: The term "alternative therapies" is often used to describe treatments considered outside the scope of conventional Western medicine. Examples of alternative therapy include acupuncture, acupressure, and yoga. Another common alternative treatment is herbal therapy. Some herbs are believed to relieve headache pain. Always discuss alternative therapies with your caregiver before proceeding. Some herbal products contain arsenic and other toxins. °TENSION HEADACHES °Q: What is a tension-type headache? What causes it? How can I treat it? °A: Tension-type headaches occur randomly. They are often the result of temporary stress, anxiety, fatigue, or anger. Symptoms include soreness in your temples, a tightening band-like sensation  around your head (a "vice-like" ache). Symptoms can also include a pulling feeling, pressure sensations, and contracting head and neck muscles. The headache begins in your forehead, temples, or the back of your head and neck. Treatment for tension-type headache may include over-the-counter or prescription medications. Treatment may also include self-help techniques such as relaxation training and biofeedback. °CLUSTER HEADACHES °Q: What is a cluster headache? What causes it? How can I treat it? °A: Cluster headache gets its name because the attacks come in groups. The pain arrives with little, if any, warning. It is usually on one side of the head. A tearing or bloodshot eye and a runny nose on the same side of the headache may also accompany the pain. Cluster headaches are believed to be caused by chemical reactions in the brain. They have been described as the most severe and intense of any headache type. Treatment for cluster headache includes prescription medication and oxygen. °SINUS HEADACHES °Q: What is a sinus headache? What causes it? How can I treat it? °A: When a cavity in the bones of the face and skull (a sinus) becomes inflamed, the inflammation will cause localized pain. This condition is usually the result of an allergic reaction, a tumor, or an infection. If your headache is caused by a sinus blockage, such as an infection, you will probably have a fever. An x-ray will confirm a sinus blockage. Your caregiver's treatment might include antibiotics for the infection, as well as antihistamines or decongestants.  °REBOUND HEADACHES °Q: What is a rebound headache? What causes it? How can I treat it? °A: A pattern of taking acute headache medications too often can lead to a condition known as "rebound headache."   A pattern of taking too much headache medication includes taking it more than 2 days per week or in excessive amounts. That means more than the label or a caregiver advises. With rebound  headaches, your medications not only stop relieving pain, they actually begin to cause headaches. Doctors treat rebound headache by tapering the medication that is being overused. Sometimes your caregiver will gradually substitute a different type of treatment or medication. Stopping may be a challenge. Regularly overusing a medication increases the potential for serious side effects. Consult a caregiver if you regularly use headache medications more than 2 days per week or more than the label advises. °ADDITIONAL QUESTIONS AND ANSWERS °Q: What is biofeedback? °A: Biofeedback is a self-help treatment. Biofeedback uses special equipment to monitor your body's involuntary physical responses. Biofeedback monitors: °· Breathing. °· Pulse. °· Heart rate. °· Temperature. °· Muscle tension. °· Brain activity. °Biofeedback helps you refine and perfect your relaxation exercises. You learn to control the physical responses that are related to stress. Once the technique has been mastered, you do not need the equipment any more. °Q: Are headaches hereditary? °A: Four out of five (80%) of people that suffer report a family history of migraine. Scientists are not sure if this is genetic or a family predisposition. Despite the uncertainty, a child has a 50% chance of having migraine if one parent suffers. The child has a 75% chance if both parents suffer.  °Q: Can children get headaches? °A: By the time they reach high school, most young people have experienced some type of headache. Many safe and effective approaches or medications can prevent a headache from occurring or stop it after it has begun.  °Q: What type of doctor should I see to diagnose and treat my headache? °A: Start with your primary caregiver. Discuss his or her experience and approach to headaches. Discuss methods of classification, diagnosis, and treatment. Your caregiver may decide to recommend you to a headache specialist, depending upon your symptoms or other  physical conditions. Having diabetes, allergies, etc., may require a more comprehensive and inclusive approach to your headache. The National Headache Foundation will provide, upon request, a list of NHF physician members in your state. °Document Released: 04/10/2003 Document Revised: 04/12/2011 Document Reviewed: 09/18/2007 °ExitCare® Patient Information ©2015 [employer]. This information is not intended to replace advice given to you by your health care provider. Make sure you discuss any questions you have with your health care provider. ° ° °Emergency Department Resource Guide °1) Find a Doctor and Pay Out of Pocket °Although you won't have to find out who is covered by your insurance plan, it is a good idea to ask around and get recommendations. You will then need to call the office and see if the doctor you have chosen will accept you as a new patient and what types of options they offer for patients who are self-pay. Some doctors offer discounts or will set up payment plans for their patients who do not have insurance, but you will need to ask so you aren't surprised when you get to your appointment. ° °2) Contact Your Local Health Department °Not all health departments have doctors that can see patients for sick visits, but many do, so it is worth a call to see if yours does. If you don't know where your local health department is, you can check in your phone book. The CDC also has a tool to help you locate your state's health department, and many state websites also   have listings of all of their local health departments. ° °3) Find a Walk-in Clinic °If your illness is not likely to be very severe or complicated, you may want to try a walk in clinic. These are popping up all over the country in pharmacies, drugstores, and shopping centers. They're usually staffed by nurse practitioners or physician assistants that have been trained to treat common illnesses and complaints. They're usually fairly quick and  inexpensive. However, if you have serious medical issues or chronic medical problems, these are probably not your best option. ° °No Primary Care Doctor: °- Call [facility] at  832-8000 - they can help you locate a primary care doctor that  accepts your insurance, provides certain services, etc. °- Physician Referral Service- 1-800-533-3463 ° °Chronic Pain Problems: °Organization         Address  Phone   Notes  °[facility]  (336) 297-2271 Patients need to be referred by their primary care doctor.  ° °Medication Assistance: °Organization         Address  Phone   Notes  °[place] [facility] [address] °[place], [place] [address] (336) 641-8030 --Must be a resident of [place] °-- Must have NO insurance coverage whatsoever (no Medicaid/ Medicare, etc.) °-- The pt. MUST have a primary care doctor that directs their care regularly and follows them in the community °  °[facility]  (866) 331-1348   °[employer]  (888) 892-1162   ° °Agencies that provide inexpensive medical care: °Organization         Address  Phone   Notes  °[facility]  (336) 832-8035   °[facility]    (336) 832-7272   °[facility] [address] °[place], [place] [address] (336) 832-4777   °[facility] [address], °[place] (336) 271-4999   °[facility]    (336) 373-0678   °[facility]    (336) 272-1050   °[facility] ° [address], [place] Phone:  (336) 832-4444, Fax:  (336) 832-4440 Hours of Operation:  9 am - 6 pm, M-F.  Also accepts Medicaid/Medicare and self-pay.  °[facility] ° [address], [place] Phone: (336) [phone], Fax: (336) [phone]. Hours of Operation:  8:30 am - 5:30 pm, M-F.  Also accepts Medicaid and self-pay.  °[facility] [place] [address], [place] Phone: (336) 878-6027   °[facility] [address], [place], [place] (336)723-1848, Ext. 123 Mondays & Thursdays: 7-9 AM.  First 15 patients are seen on a first come, first serve basis. °  ° °Medicaid-accepting [place] Providers: ° °Organization         Address  Phone   Notes  °[facility] [address], [place] (336) 641-2100 Also accepts self-pay patients.  °[facility] [address], [place] ° (336) 856-9996   °[facility] [address], [place] (336) 288-8857   °[facility] [address] [place] [address], [place] (336) 299-7000   °Veita Bland [address], [place]  ° (336) 373-1557 Only accepts [place] Access Medicaid patients after they have their name applied to their card.  ° °Self-Pay (no insurance) in [place]: ° °Organization         Address  Phone     Notes  °Sickle Cell Patients, [facility] [address], [place] (336) 832-1970   °[facility] [address], [place] (336) 832-4400   °[facility] ° [address], [place] (336) 992-4800   °[facility]/Dr. Osei-Bonsu ° [address] [place] [address], [place] or [address], [place] (336) 841-8500 Phone number for both [place] and [place] locations is the same.  °[facility] [address], [place] (336) 299-0000   °[facility] [address] [place] [address], [place] or [address] (336) 852-7530 °(336) 878-2260   °[facility] [address], [place] (336) 350-1642, phone; (336) 294-5005, fax Sees patients 1st and 3rd Saturday of every month.  Must not qualify for public or private insurance (i.e. Medicaid, Medicare, [place] Health Choice, Veterans' Benefits) • Household income should be no more than 200% of the poverty level •The clinic cannot treat you if you are pregnant or  think you are pregnant • Sexually transmitted diseases are not treated at the clinic.  ° ° °Dental Care: °Organization         Address  Phone  Notes  °[facility] [address], [place] (336) 641-6152 Accepts children up to age 21 who are enrolled in Medicaid or [place] Health Choice; pregnant women with a Medicaid card; and children who have applied for Medicaid or [place] Health Choice, but were declined, whose parents can pay a reduced fee at time of service.  °[facility] [place]  [address], [place] (336) 641-7733 Accepts children up to age 21 who are enrolled in Medicaid or [place] Health Choice; pregnant women with a Medicaid card; and children who have applied for Medicaid or [employer] Health Choice, but were declined, whose parents can pay a reduced fee at time of service.  °[facility] ° [address], [place] (336) 641-4533 Patients are seen by appointment only. Walk-ins are not accepted. [facility] will see patients 18 years of age and older. °Monday - Tuesday (8am-5pm) °Most Wednesdays (8:30-5pm) °$30 per visit, cash only  °[facility] ° [address], [place] (336) 641-4533 Patients are seen by appointment only. Walk-ins are not accepted. [facility] will see patients 18 years of age and older. °One Wednesday Evening (Monthly: Volunteer Based).  $30 per visit, cash only  °[facility]  (919) 537-3737 for adults; Children under age 4, call Graduate Pediatric Dentistry at ([phone]) [phone]. Children aged 4-14, please call ([phone]) [phone] to request a pediatric application. ° Dental services are provided in all areas of dental care including fillings, crowns and bridges, complete and partial dentures, implants, gum treatment, root canals, and extractions. Preventive care is also provided. Treatment is provided to both adults  and children. °Patients are selected via a lottery and there is often a waiting list. °  °[facility] [address], °[place] ° (336) 763-8833 [email or web address] °  °[facility] [address], [place], [place] (336)723-1848, Ext. 123 Second and Fourth Thursday of each month, opens at 6:30 AM; Clinic ends at 9 AM.  Patients are seen on a first-come first-served basis, and a limited number are seen during each clinic.  ° °[facility] ° [address], [place], [place] (336) 723-7904     Eligibility Requirements °You must have lived in [place], [place], or [place] counties for at least the last three months. °  You cannot be eligible for state or federal sponsored healthcare insurance, including Veterans Administration, Medicaid, or Medicare. °  You generally cannot be eligible for healthcare insurance through your employer.  °  How to apply: °Eligibility screenings are held every Tuesday and Wednesday afternoon from 1:00 pm until 4:00 pm. You do not need an appointment for the interview!  °[facility] [address], [place], [place] 336-631-2330   °[facility] [facility]  336-342-8273   °[facility]  336-703-3100   °[facility]  336-570-6415   ° °Behavioral Health Resources in the Community: °Intensive Outpatient Programs °Organization         Address  Phone  Notes  °[facility] [address], [place], [place] 336-878-6098   °[facility] [address], [place], [place] 336-832-9800   °[facility] [address], [place], [place] ° 336-882-2125   °[facility] [address],  °[place], [place] 1-800-853-5163 or 336-641-4981   °Substance Abuse Resources °Organization         Address  Phone  Notes  °[facility]  336-882-2125   °[facility]  336-784-9470   °[facility]  336-285-9073     °[facility]  336-845-3988   °Residential & Outpatient Substance Abuse Program  1-800-659-3381   °Psychological Services °Organization         Address  Phone  Notes  °[facility]  336- 832-9600   °[facility]  336- 378-7881   °[facility] [address], [place] 1-800-853-5163 or 336-641-4981   ° °Mobile Crisis Teams °Organization         Address  Phone  Notes  °[facility], Mobile Crisis Care Unit  1-877-626-1772   °Assertive °[facility] ° [address] [place], [place] 336-834-9664   °Sharon DeEsch [address] °[place] 336-554-5454   ° °Self-Help/Support Groups °Organization         Address  Phone             Notes  °[facility] - variety of support groups  336- 373-1402 Call for more information  °Narcotics Anonymous (NA), [facility] [address], °[place]  2 meetings at this location  ° °Residential Treatment Programs °Organization         Address  Phone  Notes  °[facility] [address],    °[place]  1-866-801-8205   °[facility] ° [address], [place], [place] 704-293-8524   °[facility] [address], [place] 336-845-3988 Admissions: 8am-3pm M-F  °[facility] [address]    °[place], [place] 336-841-1104   °[facility] [address], [place], [place] 336-379-7146   °[facility] [address]  °[place], [place] 336-285-9073   °[facility] - Intensive Outpatient [address], [place], [place] 336-852-3033   °[facility] [address]  °[place], [place] 1-877-615-2722 or 336-784-9470   °[facility] [address] [place], [place] 336-227-7417 Accepts Medicaid  °[facility] [address]  ° [place] 1-800-659-3381 Substance Abuse/Addiction Treatment  ° °[place]  Behavioral Health Resources °Organization           Address  Phone  Notes  °[facility]  (888) 581-9988   °Julie Brannon, PhD [address] [place], [place]   (336) 349-5553 or (336) 951-0000   °[facility]   [address] °[place], [place] (336) 349-4454   °[facility] [address], [place], [place] (336) 342-8316 Insurance/Medicaid/sponsorship through [facility]  °[facility] [address]                                    [place], [place] (336) 342-8316 Therapy/tele-psych/case  °[facility] [address]  ° [place], [place] (336) 349-2233    °Dr. Arfeen  (336) 349-4544   °[facility] [place]  [employer] [place] [facility] 1) [address], [place] °2) [address], [place] °3)  [address], [place] (336) 349-3220 °(336) 342-7768 ° °(336) 342-8140   °[place] [facility] (336) 342-1394 or (336) 342-3537 (After Hours)    ° ° ° °

## 2013-11-10 NOTE — ED Provider Notes (Signed)
CSN: [id number]     Arrival date & time 11/10/13  2027 History   First MD Initiated Contact with Patient 11/10/13 2116     Chief Complaint  Patient presents with  . Headache    (Consider location/radiation/quality/duration/timing/severity/associated sxs/prior Treatment) HPI Comments: 38 year old male presents to the emergency department for complaint of headache. Patient states he has a throbbing bilateral parietal headache x3 days. He states that he usually has a history of migraines when it "gets really hot". Patient has been taking Tylenol and resting with mild, temporary relief of symptoms. He states that symptoms have returned when the medication wears off. Patient states he also feels as though his eyes are throbbing which has been causing his vision "to go in and out", though he denies vision loss or blurry vision. Patient further denies fever, syncope or near syncope, difficulty speaking or swallowing, neck pain or neck stiffness, nausea, vomiting, difficulty walking, extremity numbness/paresthesias, and extremity weakness. Patient denies trauma or injury to his head. Patient has been seen every day in Emergency Department over the past week.  Patient is a 38 y.o. male presenting with headaches. The history is provided by the patient. No language interpreter was used.  Headache Associated symptoms: photophobia   Associated symptoms: no fever, no nausea, no neck pain, no neck stiffness, no numbness and no vomiting     Past Medical History  Diagnosis Date  . Anxiety   . Asthma   . Homelessness    History reviewed. No pertinent past surgical history. No family history on file. History  Substance Use Topics  . Smoking status: Current Every Day Smoker    Types: Cigarettes  . Smokeless tobacco: Not on file  . Alcohol Use: No    Review of Systems  Constitutional: Negative for fever.  HENT: Negative for trouble swallowing.   Eyes: Positive for photophobia.  Gastrointestinal:  Negative for nausea and vomiting.  Musculoskeletal: Negative for neck pain and neck stiffness.  Neurological: Positive for headaches. Negative for syncope, weakness and numbness.  All other systems reviewed and are negative.   Allergies  Review of patient's allergies indicates no known allergies.  Home Medications   Prior to Admission medications   Not on File   BP 142/94  Pulse 94  Temp(Src) 98.4 F (36.9 C) (Oral)  Resp 16  Ht 5\' 7"  (1.702 m)  Wt 150 lb (68.04 kg)  BMI 23.49 kg/m2  SpO2 98%  Physical Exam  Nursing note and vitals reviewed. Constitutional: He is oriented to person, place, and time. He appears well-developed and well-nourished. No distress.  Nontoxic/nonseptic appearing  HENT:  Head: Normocephalic and atraumatic.  Mouth/Throat: Oropharynx is clear and moist. No oropharyngeal exudate.  Symmetric rise of the uvula with phonation  Eyes: Conjunctivae and EOM are normal. Pupils are equal, round, and reactive to light. No scleral icterus.  EOMs normal. No nystagmus noted.  Neck: Normal range of motion.  No nuchal rigidity or meningismus  Cardiovascular: Normal rate, regular rhythm and normal heart sounds.   Pulmonary/Chest: Effort normal and breath sounds normal. No respiratory distress. He has no wheezes. He has no rales.  Chest expansion symmetric  Musculoskeletal: Normal range of motion.  Neurological: He is alert and oriented to person, place, and time. No cranial nerve deficit. He exhibits normal muscle tone. Coordination normal.  GCS 15. Speech is goal oriented. No cranial nerve deficits appreciated; symmetric upper raise, no facial drooping, tongue midline. Patient has equal grip strength bilaterally and 5/5 strength against resistance  in all major muscle groups bilaterally. Patient moves extremities without ataxia; finger to nose intact. No gross sensory deficits appreciated and patient ambulates with normal, steady gait.  Skin: Skin is warm and dry. No  rash noted. He is not diaphoretic. No erythema. No pallor.  Psychiatric: He has a normal mood and affect. His behavior is normal.    ED Course  Procedures (including critical care time) Labs Review Labs Reviewed - No data to display  Imaging Review Dg Thoracic Spine 2 View  11/09/2013   CLINICAL DATA:  Acute onset of right shoulder pain and upper back pain. Initial encounter.  EXAM: THORACIC SPINE - 2 VIEW  COMPARISON:  Chest radiograph performed 11/03/2013  FINDINGS: There is no evidence of fracture or subluxation. Vertebral bodies demonstrate normal height and alignment. Intervertebral disc spaces are preserved.  The visualized portions of both lungs are clear. The mediastinum is unremarkable in appearance.  IMPRESSION: No evidence of fracture or subluxation along the thoracic spine.   Electronically Signed   By: Roanna RaiderJeffery  Chang M.D.   On: 11/09/2013 01:13   Dg Shoulder Right  11/09/2013   CLINICAL DATA:  Acute onset of right shoulder pain and upper back pain. Initial encounter.  EXAM: RIGHT SHOULDER - 2+ VIEW  COMPARISON:  None.  FINDINGS: There is no evidence of fracture or dislocation. The right humeral head is seated within the glenoid fossa. The acromioclavicular joint is unremarkable in appearance. No significant soft tissue abnormalities are seen. The visualized portions of the right lung are clear.  IMPRESSION: No evidence of fracture or dislocation.   Electronically Signed   By: Roanna RaiderJeffery  Chang M.D.   On: 11/09/2013 01:12     EKG Interpretation None      MDM   Final diagnoses:  Headache, unspecified headache type    [id number] - 38 year old male presents to the emergency department for headache. This is the patient's seventh visit in the last 7 days. Patient presented for same it yesterday. He had relief of his headache with Tylenol and was discharged. Patient has a nonfocal neurologic exam today. No fever or meningismus to suggest meningitis. Doubt intracranial process history of  headaches and duration of symptoms. Patient denies acute injury or trauma. Will attempt to manage symptoms with Reglan IM and reassess. Do not believe emergent imaging is indicated at this time.  2140 - Patient declined Reglan. Naproxen PO ordered.  2230 - Patient endorses improvement in his symptoms after receiving naproxen. He feels comfortable managing his symptoms further at home. Patient given prescription for naproxen to continue taking as needed. Have referred patient to the wellness center for primary care followup. Return precautions provided and patient agreeable to plan with no unaddressed concerns.  Antony MaduraKelly Sebastyan Snodgrass, PA-C 11/10/13 2237

## 2013-11-10 NOTE — ED Provider Notes (Signed)
CSN: [id number]     Arrival date & time 11/09/13  2212 History   First MD Initiated Contact with Patient 11/10/13 0249     Chief Complaint  Patient presents with  . Headache     (Consider location/radiation/quality/duration/timing/severity/associated sxs/prior Treatment) HPI Comments: Patient here complaining of worsening of his chronic headache which is half or some time. Location of his headache is bitemporal and is worse with stress or heat. He has had some photophobia but denies any fever or neck pain. No syncope or near-syncope. No associated unilateral weakness. No nausea vomiting. Does have feels like his chronic headache. No recent history of trauma. Seen here yesterday for musculoskeletal complaints. Denies any gait disturbance. No confusion noted.  Patient is a 38 y.o. male presenting with headaches. The history is provided by the patient.  Headache   Past Medical History  Diagnosis Date  . Anxiety   . Asthma   . Homelessness    History reviewed. No pertinent past surgical history. No family history on file. History  Substance Use Topics  . Smoking status: Current Every Day Smoker    Types: Cigarettes  . Smokeless tobacco: Not on file  . Alcohol Use: No    Review of Systems  Neurological: Positive for headaches.  All other systems reviewed and are negative.     Allergies  Review of patient's allergies indicates no known allergies.  Home Medications   Prior to Admission medications   Not on File   BP 163/79  Pulse 82  Temp(Src) 99.7 F (37.6 C) (Oral)  Resp 14  Ht 5\' 7"  (1.702 m)  Wt 147 lb (66.679 kg)  BMI 23.02 kg/m2  SpO2 98% Physical Exam  Nursing note and vitals reviewed. Constitutional: He is oriented to person, place, and time. He appears well-developed and well-nourished.  Non-toxic appearance. No distress.  HENT:  Head: Normocephalic and atraumatic.  Eyes: Conjunctivae, EOM and lids are normal. Pupils are equal, round, and reactive to  light.  Neck: Normal range of motion. Neck supple. No tracheal deviation present. No mass present.  Cardiovascular: Normal rate, regular rhythm and normal heart sounds.  Exam reveals no gallop.   No murmur heard. Pulmonary/Chest: Effort normal and breath sounds normal. No stridor. No respiratory distress. He has no decreased breath sounds. He has no wheezes. He has no rhonchi. He has no rales.  Abdominal: Soft. Normal appearance and bowel sounds are normal. He exhibits no distension. There is no tenderness. There is no rebound and no CVA tenderness.  Musculoskeletal: Normal range of motion. He exhibits no edema and no tenderness.  Neurological: He is alert and oriented to person, place, and time. He has normal strength. No cranial nerve deficit or sensory deficit. GCS eye subscore is 4. GCS verbal subscore is 5. GCS motor subscore is 6.  Skin: Skin is warm and dry. No abrasion and no rash noted.  Psychiatric: He has a normal mood and affect. His speech is normal and behavior is normal.    ED Course  Procedures (including critical care time) Labs Review Labs Reviewed - No data to display  Imaging Review Dg Thoracic Spine 2 View  11/09/2013   CLINICAL DATA:  Acute onset of right shoulder pain and upper back pain. Initial encounter.  EXAM: THORACIC SPINE - 2 VIEW  COMPARISON:  Chest radiograph performed 11/03/2013  FINDINGS: There is no evidence of fracture or subluxation. Vertebral bodies demonstrate normal height and alignment. Intervertebral disc spaces are preserved.  The visualized portions of  both lungs are clear. The mediastinum is unremarkable in appearance.  IMPRESSION: No evidence of fracture or subluxation along the thoracic spine.   Electronically Signed   By: Roanna RaiderJeffery  Chang M.D.   On: 11/09/2013 01:13   Dg Shoulder Right  11/09/2013   CLINICAL DATA:  Acute onset of right shoulder pain and upper back pain. Initial encounter.  EXAM: RIGHT SHOULDER - 2+ VIEW  COMPARISON:  None.   FINDINGS: There is no evidence of fracture or dislocation. The right humeral head is seated within the glenoid fossa. The acromioclavicular joint is unremarkable in appearance. No significant soft tissue abnormalities are seen. The visualized portions of the right lung are clear.  IMPRESSION: No evidence of fracture or dislocation.   Electronically Signed   By: Roanna RaiderJeffery  Chang M.D.   On: 11/09/2013 01:12     EKG Interpretation None      MDM   Final diagnoses:  None    Patient with nonfocal neurological exam. No indication for intracranial imaging. Patient given Tylenol here and will be given referral to the [facility]    Toy BakerAnthony T Jannis Atkins, MD 11/10/13 [phone]

## 2013-11-10 NOTE — Discharge Instructions (Signed)
Tension Headache A tension headache is a feeling of pain, pressure, or aching often felt over the front and sides of the head. The pain can be dull or can feel tight (constricting). It is the most common type of headache. Tension headaches are not normally associated with nausea or vomiting and do not get worse with physical activity. Tension headaches can last 30 minutes to several days.  CAUSES  The exact cause is not known, but it may be caused by chemicals and hormones in the brain that lead to pain. Tension headaches often begin after stress, anxiety, or depression. Other triggers may include:  Alcohol.  Caffeine (too much or withdrawal).  Respiratory infections (colds, flu, sinus infections).  Dental problems or teeth clenching.  Fatigue.  Holding your head and neck in one position too long while using a computer. SYMPTOMS   Pressure around the head.   Dull, aching head pain.   Pain felt over the front and sides of the head.   Tenderness in the muscles of the head, neck, and shoulders. DIAGNOSIS  A tension headache is often diagnosed based on:   Symptoms.   Physical examination.   A CT scan or MRI of your head. These tests may be ordered if symptoms are severe or unusual. TREATMENT  Medicines may be given to help relieve symptoms.  HOME CARE INSTRUCTIONS   Only take over-the-counter or prescription medicines for pain or discomfort as directed by your caregiver.   Lie down in a dark, quiet room when you have a headache.   Keep a journal to find out what may be triggering your headaches. For example, write down:  What you eat and drink.  How much sleep you get.  Any change to your diet or medicines.  Try massage or other relaxation techniques.   Ice packs or heat applied to the head and neck can be used. Use these 3 to 4 times per day for 15 to 20 minutes each time, or as needed.   Limit stress.   Sit up straight, and do not tense your muscles.    Quit smoking if you smoke.  Limit alcohol use.  Decrease the amount of caffeine you drink, or stop drinking caffeine.  Eat and exercise regularly.  Get 7 to 9 hours of sleep, or as recommended by your caregiver.  Avoid excessive use of pain medicine as recurrent headaches can occur.  SEEK MEDICAL CARE IF:   You have problems with the medicines you were prescribed.  Your medicines do not work.  You have a change from the usual headache.  You have nausea or vomiting. SEEK IMMEDIATE MEDICAL CARE IF:   Your headache becomes severe.  You have a fever.  You have a stiff neck.  You have loss of vision.  You have muscular weakness or loss of muscle control.  You lose your balance or have trouble walking.  You feel faint or pass out.  You have severe symptoms that are different from your first symptoms. MAKE SURE YOU:   Understand these instructions.  Will watch your condition.  Will get help right away if you are not doing well or get worse. Document Released: 01/18/2005 Document Revised: 04/12/2011 Document Reviewed: 01/08/2011 Exit[employer] Patient Information 2015 [employer]. This information is not intended to replace advice given to you by your health care provider. Make sure you discuss any questions you have with your health care provider. General Headache Without Cause A headache is pain or discomfort felt around the   head or neck area. The specific cause of a headache may not be found. There are many causes and types of headaches. A few common ones are:  Tension headaches.  Migraine headaches.  Cluster headaches.  Chronic daily headaches. HOME CARE INSTRUCTIONS   Keep all follow-up appointments with your caregiver or any specialist referral.  Only take over-the-counter or prescription medicines for pain or discomfort as directed by your caregiver.  Lie down in a dark, quiet room when you have a headache.  Keep a headache journal to find out what  may trigger your migraine headaches. For example, write down:  What you eat and drink.  How much sleep you get.  Any change to your diet or medicines.  Try massage or other relaxation techniques.  Put ice packs or heat on the head and neck. Use these 3 to 4 times per day for 15 to 20 minutes each time, or as needed.  Limit stress.  Sit up straight, and do not tense your muscles.  Quit smoking if you smoke.  Limit alcohol use.  Decrease the amount of caffeine you drink, or stop drinking caffeine.  Eat and sleep on a regular schedule.  Get 7 to 9 hours of sleep, or as recommended by your caregiver.  Keep lights dim if bright lights bother you and make your headaches worse. SEEK MEDICAL CARE IF:   You have problems with the medicines you were prescribed.  Your medicines are not working.  You have a change from the usual headache.  You have nausea or vomiting. SEEK IMMEDIATE MEDICAL CARE IF:   Your headache becomes severe.  You have a fever.  You have a stiff neck.  You have loss of vision.  You have muscular weakness or loss of muscle control.  You start losing your balance or have trouble walking.  You feel faint or pass out.  You have severe symptoms that are different from your first symptoms. MAKE SURE YOU:   Understand these instructions.  Will watch your condition.  Will get help right away if you are not doing well or get worse. Document Released: 01/18/2005 Document Revised: 04/12/2011 Document Reviewed: 02/03/2011 Exit[employer] Patient Information 2015 [employer]. This information is not intended to replace advice given to you by your health care provider. Make sure you discuss any questions you have with your health care provider.  

## 2013-11-11 NOTE — ED Provider Notes (Signed)
Medical screening examination/treatment/procedure(s) were performed by non-physician practitioner and as supervising physician I was immediately available for consultation/collaboration.    Audie Stayer D Alieyah Spader, MD 11/11/13 1028 

## 2013-11-18 ENCOUNTER — Encounter (HOSPITAL_COMMUNITY): Payer: Self-pay | Admitting: Emergency Medicine

## 2013-11-18 ENCOUNTER — Emergency Department (HOSPITAL_COMMUNITY)
Admission: EM | Admit: 2013-11-18 | Discharge: 2013-11-18 | Disposition: A | Discharge status: Home / Self Care | Payer: Self-pay | Attending: Emergency Medicine | Admitting: Emergency Medicine

## 2013-11-18 DIAGNOSIS — J45909 Unspecified asthma, uncomplicated: Secondary | ICD-10-CM | POA: Insufficient documentation

## 2013-11-18 DIAGNOSIS — Z8659 Personal history of other mental and behavioral disorders: Secondary | ICD-10-CM | POA: Insufficient documentation

## 2013-11-18 DIAGNOSIS — R51 Headache: Secondary | ICD-10-CM | POA: Insufficient documentation

## 2013-11-18 DIAGNOSIS — R519 Headache, unspecified: Secondary | ICD-10-CM

## 2013-11-18 DIAGNOSIS — Z720 Tobacco use: Secondary | ICD-10-CM | POA: Insufficient documentation

## 2013-11-18 MED ORDER — ACETAMINOPHEN 325 MG PO TABS
650.0000 mg | ORAL_TABLET | Freq: Once | ORAL | Status: AC
  Administered 2013-11-18: 650 mg via ORAL
  Filled 2013-11-18: qty 2

## 2013-11-18 NOTE — ED Notes (Signed)
Pt sleeping.  Warm blanket given

## 2013-11-18 NOTE — ED Notes (Signed)
The pt is c/o a minor headache for one week.  No n or v.  He reports that he just wants a tylenol.  His girlfriend was seen here earlier.

## 2013-11-18 NOTE — ED Notes (Signed)
Patient arrives with complaint of headache. Denies history of chronic issue or migraine history. Presents primarily because "I am stranded", explains that "my girl left me here and hasn't come back", states "I just need a tylenol". Denies fever, dizziness, nausea, vomiting, photophobia, and noise sensitivity.

## 2013-11-18 NOTE — ED Provider Notes (Signed)
CSN: [id number]     Arrival date & time 11/18/13  0221 History   First MD Initiated Contact with Patient 11/18/13 0413     Chief Complaint  Patient presents with  . Headache     Patient states his had a headache over the past week.  Seems emergency department  5 times and he has no money to afford Tylenol over-the-counter.  Presents requesting a Tylenol only.  He denies weakness of his arms or legs.  No nausea or vomiting.  No change in vision.  No other complaints.this month.  He states that he is homeless and is preparing to return to [place].  He states his had a lot of stress HPI  Past Medical History  Diagnosis Date  . Anxiety   . Asthma   . Homelessness    History reviewed. No pertinent past surgical history. No family history on file. History  Substance Use Topics  . Smoking status: Current Every Day Smoker -- 2.00 packs/day for 5 years    Types: Cigarettes  . Smokeless tobacco: Not on file  . Alcohol Use: No    Review of Systems  All other systems reviewed and are negative.     Allergies  Review of patient's allergies indicates no known allergies.  Home Medications   Prior to Admission medications   Medication Sig Start Date End Date Taking? Authorizing Provider  acetaminophen (TYLENOL) 500 MG tablet Take 1,000 mg by mouth every 6 (six) hours as needed for headache.   Yes Historical Provider, MD  naproxen (NAPROSYN) 500 MG tablet Take 1 tablet (500 mg total) by mouth 2 (two) times daily. 11/10/13   Antony MaduraKelly Humes, PA-C   BP 131/66  Pulse 63  Temp(Src) 98.4 F (36.9 C) (Oral)  Resp 16  Wt 150 lb (68.04 kg)  SpO2 100% Physical Exam  Nursing note and vitals reviewed. Constitutional: He is oriented to person, place, and time. He appears well-developed and well-nourished.  HENT:  Head: Normocephalic and atraumatic.  Eyes: EOM are normal. Pupils are equal, round, and reactive to light.  Neck: Normal range of motion.  Cardiovascular: Normal rate, regular  rhythm, normal heart sounds and intact distal pulses.   Pulmonary/Chest: Effort normal and breath sounds normal. No respiratory distress.  Abdominal: Soft. He exhibits no distension. There is no tenderness.  Musculoskeletal: Normal range of motion.  Neurological: He is alert and oriented to person, place, and time.  5/5 strength in major muscle groups of  bilateral upper and lower extremities. Speech normal. No facial asymetry.   Skin: Skin is warm and dry.  Psychiatric: He has a normal mood and affect. Judgment normal.    ED Course  Procedures (including critical care time) Labs Review Labs Reviewed - No data to display  Imaging Review No results found.   EKG Interpretation None      MDM   Final diagnoses:  Headache, unspecified headache type    Normal neuro exam.  Discharge home.  Tylenol given.  No indication for additional testing.    Lyanne CoKevin M Zehra Rucci, MD 11/18/13 [phone]

## 2013-11-19 ENCOUNTER — Emergency Department (HOSPITAL_COMMUNITY)
Admission: EM | Admit: 2013-11-19 | Discharge: 2013-11-19 | Disposition: A | Discharge status: Home / Self Care | Payer: Self-pay | Attending: Emergency Medicine | Admitting: Emergency Medicine

## 2013-11-19 ENCOUNTER — Encounter (HOSPITAL_COMMUNITY): Payer: Self-pay | Admitting: Emergency Medicine

## 2013-11-19 DIAGNOSIS — R51 Headache: Secondary | ICD-10-CM | POA: Insufficient documentation

## 2013-11-19 DIAGNOSIS — J45909 Unspecified asthma, uncomplicated: Secondary | ICD-10-CM | POA: Insufficient documentation

## 2013-11-19 DIAGNOSIS — R42 Dizziness and giddiness: Secondary | ICD-10-CM | POA: Insufficient documentation

## 2013-11-19 DIAGNOSIS — Z8659 Personal history of other mental and behavioral disorders: Secondary | ICD-10-CM | POA: Insufficient documentation

## 2013-11-19 DIAGNOSIS — Z720 Tobacco use: Secondary | ICD-10-CM | POA: Insufficient documentation

## 2013-11-19 DIAGNOSIS — Z8679 Personal history of other diseases of the circulatory system: Secondary | ICD-10-CM | POA: Insufficient documentation

## 2013-11-19 DIAGNOSIS — Z590 Homelessness: Secondary | ICD-10-CM | POA: Insufficient documentation

## 2013-11-19 DIAGNOSIS — R519 Headache, unspecified: Secondary | ICD-10-CM

## 2013-11-19 HISTORY — DX: Migraine, unspecified, not intractable, without status migrainosus: G43.909

## 2013-11-19 MED ORDER — NAPROXEN 500 MG PO TABS
500.0000 mg | ORAL_TABLET | Freq: Once | ORAL | Status: AC
  Administered 2013-11-19: 500 mg via ORAL
  Filled 2013-11-19: qty 1

## 2013-11-19 MED ORDER — NAPROXEN 500 MG PO TABS: 500.0000 mg | ORAL_TABLET | Freq: Two times a day (BID) | ORAL | Status: DC

## 2013-11-19 NOTE — Discharge Instructions (Signed)

## 2013-11-19 NOTE — ED Provider Notes (Signed)
CSN: [id number]     Arrival date & time 11/19/13  2027 History  This chart was scribed for a non-physician practitioner, Antony MaduraKelly Evani Shrider, PA-C, working with Toy BakerAnthony T Allen, MD by Julian HyMorgan Graham, ED Scribe. The patient was seen in WTR6/WTR6. The patient's care was started at 10:01 PM.   Chief Complaint  Patient presents with  . Migraine   HPI HPI Comments: Dustin Norris is a 38 y.o. male who presents to the Emergency Department complaining of recurring, moderate headache onset four days ago. Pt states he has been stressed out and unable to sleep. Pt reports associated dizziness. Pt states he gets migraines with weather changes. He describes his headaches as throbbing. Pt localizes his pain to the front of his forehead and around both eyes. Patient attempts taking ibuprofen and tylenol for his symptoms with relief. He notes he took tylenol about 11 hours ago. Pt notes he is planning on staying in [place]. Pt was previously seen at [facility] for similar symptoms one day ago. He notes he has a history of migraine headaches that began when he was a child. He denies having a neurology referral. Pt denies hearing loss, nausea, vomiting, weakness, numbness, unsteady gait, fever, neck stiffness, neck pain, photophobia, vision loss or vision changes.  He notes he is new to the area and he is struggling. Pt states he is between jobs and he is "financially strapped". He states he has a job lined up.   Past Medical History  Diagnosis Date  . Anxiety   . Asthma   . Homelessness   . Migraine    History reviewed. No pertinent past surgical history. No family history on file. History  Substance Use Topics  . Smoking status: Current Every Day Smoker -- 2.00 packs/day for 5 years    Types: Cigarettes  . Smokeless tobacco: Not on file  . Alcohol Use: No    Review of Systems  Constitutional: Negative for fever.  HENT: Negative for tinnitus.   Eyes: Positive for pain. Negative for photophobia and  visual disturbance.  Gastrointestinal: Negative for nausea and vomiting.  Musculoskeletal: Negative for neck pain and neck stiffness.  Neurological: Positive for dizziness and headaches. Negative for weakness and numbness.    Allergies  Review of patient's allergies indicates no known allergies.  Home Medications   Prior to Admission medications   Medication Sig Start Date End Date Taking? Authorizing Provider  acetaminophen (TYLENOL) 500 MG tablet Take 1,000 mg by mouth every 6 (six) hours as needed for headache.    Historical Provider, MD  naproxen (NAPROSYN) 500 MG tablet Take 1 tablet (500 mg total) by mouth 2 (two) times daily. 11/19/13   Antony MaduraKelly Caedence Snowden, PA-C   Triage Vitals: BP 135/65  Pulse 58  Temp(Src) 97.7 F (36.5 C) (Oral)  Resp 18  SpO2 100%  Physical Exam  Nursing note and vitals reviewed. Constitutional: He is oriented to person, place, and time. He appears well-developed and well-nourished. No distress.  Nontoxic/nonseptic appearing  HENT:  Head: Normocephalic and atraumatic.  Mouth/Throat: Oropharynx is clear and moist. No oropharyngeal exudate.  Uvula midline.  Eyes: Conjunctivae and EOM are normal. Pupils are equal, round, and reactive to light. No scleral icterus.  EOMs normal without nystagmus  Neck: Normal range of motion.  No nuchal rigidity or meningismus. Neck supple.  Pulmonary/Chest: Effort normal. No respiratory distress.  Musculoskeletal: Normal range of motion.  Neurological: He is alert and oriented to person, place, and time. No cranial nerve deficit. He exhibits  normal muscle tone. Coordination normal.  GCS 15. Speech is goal oriented. No cranial nerve deficits appreciated; symmetric eyebrow raise, no facial drooping, tongue midline. Patient ambulates with normal gait. He has 5/5 grip strength and strength against resistance in all major muscle groups bilaterally. Patient moves extremities without ataxia. Finger to nose intact. No sensory deficits  appreciated.  Skin: Skin is warm and dry. No rash noted. He is not diaphoretic. No erythema. No pallor.  Psychiatric: He has a normal mood and affect. His behavior is normal.    ED Course  Procedures (including critical care time) DIAGNOSTIC STUDIES: Oxygen Saturation is 100% on RA, normal by my interpretation.    COORDINATION OF CARE: 10:08 PM- Patient informed of current plan for treatment and evaluation and agrees with plan at this time.  MDM   Final diagnoses:  Headache, unspecified headache type    38 year old male presents to the emergency department for further evaluation of headaches. Patient states that he has had similar headaches since he was an adolescent; headaches off and associated with weather changes. Patient states that he has been stressed lately and not sleeping well. Symptoms have been relieved with Tylenol and/or ibuprofen. Patient has been seen multiple times in the past month for similar symptoms.  Suspect that headaches are secondary to worsening stress. Patient treated in ED with naproxen. He has a normal, nonfocal neurologic exam. No nuchal rigidity or meningismus. No indications for further emergent workup or imaging. Patient stable and appropriate for discharge. Prescription for naproxen provided and return precautions discussed. Patient agreeable to plan with no unaddressed concerns. Patient discharged in good condition.  I personally performed the services described in this documentation, which was scribed in my presence. The recorded information has been reviewed and is accurate.   Filed Vitals:   11/19/13 2106  BP: 135/65  Pulse: 58  Temp: 97.7 F (36.5 C)  TempSrc: Oral  Resp: 18  SpO2: 100%      Antony MaduraKelly Rooney Gladwin, PA-C 11/19/13 2301

## 2013-11-19 NOTE — ED Provider Notes (Signed)
Medical screening examination/treatment/procedure(s) were performed by non-physician practitioner and as supervising physician I was immediately available for consultation/collaboration.  Michalene Debruler T Buster Schueller, MD 11/19/13 2354 

## 2013-11-19 NOTE — ED Notes (Addendum)
Pt states that he has a hx of migraines and began to have current migraine on Thurs; pt states that he is still continuing to experience the same migraine today; pt was seen at [facility] yesterday for same thing; pt states that the pain is all over his head and that is typical for him; pt states that he is under increased stress and that is what is making his continue to hurt. Pt sitting in triage in no apparent distress; pt sitting up watching TV with remote to ear; pt denies photophobia; pt ambulatory without difficulty.

## 2013-11-20 ENCOUNTER — Emergency Department (HOSPITAL_COMMUNITY)
Admission: EM | Admit: 2013-11-20 | Discharge: 2013-11-20 | Disposition: A | Discharge status: Home / Self Care | Payer: Self-pay | Attending: Emergency Medicine | Admitting: Emergency Medicine

## 2013-11-20 ENCOUNTER — Encounter (HOSPITAL_COMMUNITY): Payer: Self-pay | Admitting: Emergency Medicine

## 2013-11-20 DIAGNOSIS — J45909 Unspecified asthma, uncomplicated: Secondary | ICD-10-CM | POA: Insufficient documentation

## 2013-11-20 DIAGNOSIS — Z8659 Personal history of other mental and behavioral disorders: Secondary | ICD-10-CM | POA: Insufficient documentation

## 2013-11-20 DIAGNOSIS — Z720 Tobacco use: Secondary | ICD-10-CM | POA: Insufficient documentation

## 2013-11-20 DIAGNOSIS — Z139 Encounter for screening, unspecified: Secondary | ICD-10-CM

## 2013-11-20 DIAGNOSIS — Z021 Encounter for pre-employment examination: Secondary | ICD-10-CM | POA: Insufficient documentation

## 2013-11-20 DIAGNOSIS — Z8669 Personal history of other diseases of the nervous system and sense organs: Secondary | ICD-10-CM | POA: Insufficient documentation

## 2013-11-20 NOTE — Discharge Instructions (Signed)
You EKG was normal, vision was also normal. Cleared to start work. Return to the ED for new concerns.

## 2013-11-20 NOTE — ED Notes (Signed)
Coffee given per request with cream and sugar

## 2013-11-20 NOTE — ED Provider Notes (Signed)
CSN: [id number]     Arrival date & time 11/20/13  1929 History  This chart was scribed for Dustin SitesLisa Breydon Senters, PA-C, working with Dustin Canalavid H Yao, MD by Dustin Norris, ED Scribe. This patient was seen in room WTR7/WTR7 and the patient's care was started at 8:12 PM.    Chief Complaint  Patient presents with  . SPORTSEXAM    The history is provided by the patient. No language interpreter was used.   HPI Comments: Dustin Norris is a 38 y.o. male who presents to the Emergency Department for a physical. Pt states he would like an EKG and an eye exam. Pt denies any pain or discomfort at this time. 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 Pt does not have any forms from his job to fill out, but states he has already had some blood work done.  Patient does not wear glasses or contact lenses.  No prior hx of cardiac problems.  Patient is a daily smoker.  VS stable on arrival.  Past Medical History  Diagnosis Date  . Anxiety   . Asthma   . Homelessness   . Migraine    History reviewed. No pertinent past surgical history. History reviewed. No pertinent family history. History  Substance Use Topics  . Smoking status: Current Every Day Smoker -- 2.00 packs/day for 5 years    Types: Cigarettes  . Smokeless tobacco: Not on file  . Alcohol Use: No    Review of Systems  Constitutional: Negative for fever and chills.       Medical screening exam  Respiratory: Negative for cough, chest tightness and shortness of breath.   Gastrointestinal: Negative for nausea, vomiting and diarrhea.  Skin: Negative for rash.  Psychiatric/Behavioral: Negative for agitation.  All other systems reviewed and are negative.     Allergies  Review of patient's allergies indicates no known allergies.  Home Medications   Prior to Admission medications   Medication Sig Start Date End Date Taking? Authorizing Provider  acetaminophen (TYLENOL) 500 MG tablet Take 1,000 mg by mouth every 6  (six) hours as needed for headache.    Historical Provider, MD  naproxen (NAPROSYN) 500 MG tablet Take 1 tablet (500 mg total) by mouth 2 (two) times daily. 11/19/13   Antony MaduraKelly Humes, PA-C   BP 127/82  Pulse 55  Temp(Src) 97.5 F (36.4 C) (Oral)  Resp 18  Ht 5\' 7"  (1.702 m)  Wt 148 lb (67.132 kg)  BMI 23.17 kg/m2  SpO2 100%  Physical Exam  Nursing note and vitals reviewed. Constitutional: He is oriented to person, place, and time. He appears well-developed and well-nourished. No distress.  HENT:  Head: Normocephalic and atraumatic.  Mouth/Throat: Oropharynx is clear and moist.  Eyes: Conjunctivae, EOM and lids are normal. Pupils are equal, round, and reactive to light. Right conjunctiva is not injected. Right conjunctiva has no hemorrhage. Left conjunctiva is not injected. Left conjunctiva has no hemorrhage. Right eye exhibits normal extraocular motion and no nystagmus. Left eye exhibits normal extraocular motion and no nystagmus.  Neck: Normal range of motion. Neck supple.  Cardiovascular: Normal rate, regular rhythm and normal heart sounds.   Pulmonary/Chest: Effort normal and breath sounds normal. No respiratory distress. He has no wheezes.  Abdominal: Soft. Bowel sounds are normal. There is no tenderness. There is no guarding.  Musculoskeletal: Normal range of motion.  Neurological: He is alert and oriented to person, place, and time.  Skin: Skin is  warm and dry. He is not diaphoretic.  Psychiatric: He has a normal mood and affect.    ED Course  Procedures DIAGNOSTIC STUDIES: Oxygen Saturation is 100% on RA, Normal by my interpretation.    COORDINATION OF CARE: 8:17 PM Discussed treatment plan with pt at bedside and pt agreed to plan.  Labs Review Labs Reviewed - No data to display  Imaging Review No results found.   EKG Interpretation   Date/Time:  Tuesday November 20 2013 20:28:59 EDT Ventricular Rate:  47 PR Interval:  140 QRS Duration: 90 QT Interval:  432 QTC  Calculation: 382 R Axis:   82 Text Interpretation:  Marked sinus bradycardia RSR' or QR pattern in V1  suggests right ventricular conduction delay Minimal voltage criteria for  LVH, may be normal variant Abnormal ECG No significant change since last  tracing Confirmed by YAO  MD, DAVID ([id number]) on 11/20/2013 8:55:40 PM      MDM   Final diagnoses:  Encounter for medical screening examination   38 year old male requesting medical screening exam 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 He has no current complaints but is requesting an EKG and an eye exam.   Visual acuity (20/20 bilateral, left 20/15 and right 20/20 at 20 ft. Loraine LericheMark).  EKG with bradycardia, unchanged from previous.  Patient has no abnormalities noted exam. He is medically cleared to begin work.  Discussed plan with patient, he/she acknowledged understanding and agreed with plan of care.  Return precautions given for new or worsening symptoms.  I personally performed the services described in this documentation, which was scribed in my presence. The recorded information has been reviewed and is accurate.  Garlon HatchetLisa M Ariahna Smiddy, PA-C 11/20/13 2240

## 2013-11-20 NOTE — ED Provider Notes (Signed)
Medical screening examination/treatment/procedure(s) were performed by non-physician practitioner and as supervising physician I was immediately available for consultation/collaboration.   EKG Interpretation   Date/Time:  Tuesday November 20 2013 20:28:59 EDT Ventricular Rate:  47 PR Interval:  140 QRS Duration: 90 QT Interval:  432 QTC Calculation: 382 R Axis:   82 Text Interpretation:  Marked sinus bradycardia RSR' or QR pattern in V1  suggests right ventricular conduction delay Minimal voltage criteria for  LVH, may be normal variant Abnormal ECG No significant change since last  tracing Confirmed by YAO  MD, DAVID ([id number]) on 11/20/2013 8:55:40 PM        Richardean Canalavid H Yao, MD 11/20/13 2330

## 2013-11-20 NOTE — ED Notes (Signed)
Pt states that he wants a physical for a job, no complaints at this time

## 2013-11-20 NOTE — ED Notes (Signed)
Pt states he is just here for a "random" Check,  I ask him if something was going on that we needed to be aware and because he has been here 10 times in 20 days,  He diverted eyes and said no/

## 2013-11-20 NOTE — ED Notes (Signed)
Pt given bus ticket per request

## 2013-11-22 ENCOUNTER — Emergency Department (HOSPITAL_COMMUNITY)
Admission: EM | Admit: 2013-11-22 | Discharge: 2013-11-22 | Disposition: A | Discharge status: Home / Self Care | Payer: Self-pay | Attending: Emergency Medicine | Admitting: Emergency Medicine

## 2013-11-22 ENCOUNTER — Encounter (HOSPITAL_COMMUNITY): Payer: Self-pay | Admitting: Emergency Medicine

## 2013-11-22 DIAGNOSIS — Z8659 Personal history of other mental and behavioral disorders: Secondary | ICD-10-CM | POA: Insufficient documentation

## 2013-11-22 DIAGNOSIS — Z720 Tobacco use: Secondary | ICD-10-CM | POA: Insufficient documentation

## 2013-11-22 DIAGNOSIS — J45909 Unspecified asthma, uncomplicated: Secondary | ICD-10-CM | POA: Insufficient documentation

## 2013-11-22 DIAGNOSIS — B353 Tinea pedis: Secondary | ICD-10-CM | POA: Insufficient documentation

## 2013-11-22 DIAGNOSIS — Z8669 Personal history of other diseases of the nervous system and sense organs: Secondary | ICD-10-CM | POA: Insufficient documentation

## 2013-11-22 MED ORDER — TOLNAFTATE 1 % EX POWD
Freq: Two times a day (BID) | CUTANEOUS | Status: DC
  Filled 2013-11-22 (×2): qty 45

## 2013-11-22 MED ORDER — NYSTATIN 100000 UNIT/GM EX POWD
Freq: Two times a day (BID) | CUTANEOUS | Status: DC
Start: 2013-11-22 — End: 2013-11-23
  Administered 2013-11-22: via TOPICAL
  Filled 2013-11-22: qty 15

## 2013-11-22 NOTE — ED Provider Notes (Signed)
CSN: [id number]     Arrival date & time 11/22/13  2004 History   None    This chart was scribed for non-physician practitioner, Dustin DredgeEmily Kierre Deines PA-C working with Dustin SproutWhitney Plunkett, MD by Dustin OrganAshley Norris, ED Scribe. This patient was seen in room WTR6/WTR6 and the patient's care was started at 8:25 PM.   Chief Complaint  Patient presents with  . Foot Pain   The history is provided by the patient. No language interpreter was used.    HPI Comments: Dustin Norris is a 38 y.o. male with a PMHx of anxiety and asthma who presents to the Emergency Department complaining of constant, moderate superficial R foot pain x 2 days that is unchanged.  He describes discomfort as burning and localized to the toes and plantar aspect. Pt attributes pain to recently popped a blister on his foot. Dustin Norris feels as though he has athletes feet as he has been experiencing itching to the feet as well as intermittent foul odor. States he walks around often and changes his socks every other day.  He is homeless. He denies any nausea, vomiting, SOB, or CP. No known allergies to medications. No other concerns this visit.  He is not diabetic.  Past Medical History  Diagnosis Date  . Anxiety   . Asthma   . Homelessness   . Migraine    History reviewed. No pertinent past surgical history. History reviewed. No pertinent family history. History  Substance Use Topics  . Smoking status: Current Every Day Smoker -- 2.00 packs/day for 5 years    Types: Cigarettes  . Smokeless tobacco: Not on file  . Alcohol Use: No    Review of Systems  All other systems reviewed and are negative.     Allergies  Review of patient's allergies indicates no known allergies.  Home Medications   Prior to Admission medications   Medication Sig Start Date End Date Taking? Authorizing Provider  acetaminophen (TYLENOL) 500 MG tablet Take 1,000 mg by mouth every 6 (six) hours as needed for headache.    Historical Provider, MD  naproxen  (NAPROSYN) 500 MG tablet Take 1 tablet (500 mg total) by mouth 2 (two) times daily. 11/19/13   Dustin MaduraKelly Humes, PA-C   Triage Vitals: BP 148/66  Pulse 74  Temp(Src) 98.6 F (37 C) (Oral)  Resp 18  SpO2 100%   Physical Exam  Nursing note and vitals reviewed. Constitutional: He appears well-developed and well-nourished. No distress.  HENT:  Head: Normocephalic and atraumatic.  Neck: Neck supple.  Pulmonary/Chest: Effort normal.  Neurological: He is alert.  Skin: He is not diaphoretic.  bilateral feet with dry skin over soles of feet All toes with mild edema over pads Mild tenderness to palpation to plantar aspect of R foot No erythema, warmth, or dish Intact blister to planar aspect of 2nd toe on R foot Callused area over the MTP of R foot    ED Course  Procedures (including critical care time)  DIAGNOSTIC STUDIES: Oxygen Saturation is 100% on RA, Normal by my interpretation.    COORDINATION OF CARE: 8:25 PM- Will give Tinactin ointment here in ED. Discussed treatment plan with pt at bedside and pt agreed to plan.     Labs Review Labs Reviewed - No data to display  Imaging Review No results found.   EKG Interpretation None      MDM   Final diagnoses:  Tinea pedis of both feet    Afebrile, nontoxic patient with bilateral foot discomfort.  No other complaints.  Pt is homeless and has poor hygiene, though he does shower and change his socks every other day and use baby powder to try to keep his feet dry.  He has apparent tinea pedis on bilateral feet, also an intact blister and large callous.  No e/o bacterial infection.   D/C home with tinactin foot powder and resources for follow up.  Discussed result, findings, treatment, and follow up  with patient.  Pt given return precautions.  Pt verbalizes understanding and agrees with plan.       I doubt any other EMC precluding discharge at this time including, but not necessarily limited to the following: cellulitis, abscess,  DVT.  I personally performed the services described in this documentation, which was scribed in my presence. The recorded information has been reviewed and is accurate.    Dustin Dredgemily Shantai Tiedeman, PA-C 11/22/13 2236

## 2013-11-22 NOTE — ED Notes (Signed)
Continue to await Tinactin from Pharmacy; pharmacy notified multiple times that are still waiting

## 2013-11-22 NOTE — ED Provider Notes (Signed)
Medical screening examination/treatment/procedure(s) were performed by non-physician practitioner and as supervising physician I was immediately available for consultation/collaboration.   EKG Interpretation None        Davit Vassar, MD 11/22/13 2354 

## 2013-11-22 NOTE — Discharge Instructions (Signed)
Read the information below.  You may return to the Emergency Department at any time for worsening condition or any new symptoms that concern you.  If you develop redness, swelling, pus draining from the wound, or fevers greater than 100.4, return to the ER immediately for a recheck.    Athlete's Foot  Athlete's foot is a skin infection caused by a fungus. Athlete's foot is often seen between or under the toes. It can also be seen on the bottom of the foot. Athlete's foot can spread to other people by sharing towels or shower stalls. HOME CARE  Only take medicines as told by your doctor. Do not use steroid creams.  Wash your feet daily. Dry your feet well, especially between the toes.  Change your socks every day. Wear cotton or wool socks.  Change your socks 2 to 3 times a day in hot weather.  Wear sandals or canvas tennis shoes with good airflow.  If you have blisters, soak your feet in a solution as told by your doctor. Do this for 20 to 30 minutes, 2 times a day. Dry your feet well after you soak them.  Do not share towels.  Wear sandals when you use shared locker rooms or showers. GET HELP RIGHT AWAY IF:   You have a fever.  Your foot is puffy (swollen), sore, warm, or red.  You are not getting better after 7 days of treatment.  You still have athlete's foot after 30 days.  You have problems caused by your medicine. MAKE SURE YOU:   Understand these instructions.  Will watch your condition.  Will get help right away if you are not doing well or get worse. Document Released: 07/07/2007 Document Revised: 04/12/2011 Document Reviewed: 11/06/2010 [facility] Patient Information 2015 [place], [place]. This information is not intended to replace advice given to you by your health care provider. Make sure you discuss any questions you have with your health care provider.    Emergency Department Resource Guide 1) Find a Doctor and Pay Out of Pocket Although you won't have to  find out who is covered by your insurance plan, it is a good idea to ask around and get recommendations. You will then need to call the office and see if the doctor you have chosen will accept you as a new patient and what types of options they offer for patients who are self-pay. Some doctors offer discounts or will set up payment plans for their patients who do not have insurance, but you will need to ask so you aren't surprised when you get to your appointment.  2) Contact Your Local Health Department Not all health departments have doctors that can see patients for sick visits, but many do, so it is worth a call to see if yours does. If you don't know where your local health department is, you can check in your phone book. The CDC also has a tool to help you locate your state's health department, and many state websites also have listings of all of their local health departments.  3) Find a Walk-in Clinic If your illness is not likely to be very severe or complicated, you may want to try a walk in clinic. These are popping up all over the country in pharmacies, drugstores, and shopping centers. They're usually staffed by nurse practitioners or physician assistants that have been trained to treat common illnesses and complaints. They're usually fairly quick and inexpensive. However, if you have serious medical issues or chronic medical  problems, these are probably not your best option.  No Primary Care Doctor: - Call Health Connect at  [phone] - they can help you locate a primary care doctor that  accepts your insurance, provides certain services, etc. - Physician Referral Service- [phone]  Chronic Pain Problems: Organization         Address  Phone   Notes  [facility]  [phone] Patients need to be referred by their primary care doctor.   Medication Assistance: Organization         Address  Phone   Notes  [facility] Medication [facility] [address] [place] [place], [place] [address] [phone] --Must be a resident of [facility] -- Must have NO insurance coverage whatsoever (no Medicaid/ Medicare, etc.) -- The pt. MUST have a primary care doctor that directs their care regularly and follows them in the community   [facility]  [phone]   [employer]  [phone]    Agencies that provide inexpensive medical care: Organization         Address  Phone   Notes  [facility]  [phone]   [facility]    [phone]   [facility] [address] [place], [place] [address] [phone]   [facility] [address] [place], [place] [phone]   Planned Parenthood    [phone]   [facility]    [phone]   [facility]  [address], [place] Phone:  [phone], Fax:  [phone] Hours of Operation:  9 am - 6 pm, M-F.  Also accepts Medicaid/Medicare and self-pay.  [facility]  [address], [place] Phone: [phone], Fax: [phone]. Hours of Operation:  8:30 am - 5:30 pm, M-F.  Also accepts Medicaid and self-pay.  [facility] [place] [address] [place] Phone: [phone]   [facility] [address] Natasha Bence [place], [place] [phone], Ext. [phone] Mondays & Thursdays: 7-9 AM.  First 15 patients are seen on a first come, first serve basis.    Medicaid-accepting [facility] Providers:  Organization         Address  Phone   Notes  [facility] [address],  [phone] Also accepts self-pay patients.  [facility] [address] Laurell Josephs [place], [place]  [phone]   [facility] [address], [place] [phone]   [facility] [address], [place] [phone]   Renaye Rakers [address], [place]   [phone] Only accepts [place] patients after they have their name applied to their card.   Self-Pay (no insurance) in [facility]:  Organization         Address  Phone   Notes  Sickle Cell Patients, [facility] [address] [place], [place] [phone]   [facility] [address] [place], [place] [phone]   [facility] [place]  [address], [place] [phone]   [facility]/Dr. Osei-Bonsu  [address], [place] or [address], [place] [phone] Phone number for both [place] and [place] locations is the same.  Urgent Medical  and [facility] [address], [place] [phone]   [facility] [place] [address] [place] [address], [place] or [address] [phone]) [phone]   [facility] [address], [place] [phone]) [phone], phone; [phone]) [phone], fax Sees patients 1st and 3rd Saturday of every month.  Must not qualify for public or private insurance (i.e. Medicaid, Medicare, [place] Health Choice, Veterans' Benefits)  Household income should be no more than 200% of the poverty level The clinic cannot treat you if you are pregnant or think you are pregnant  Sexually transmitted diseases are not treated at the clinic.    Dental Care: Organization         Address  Phone  Notes  [facility] [address], [place] [phone] Accepts children up to age 38 who are enrolled in [place]icaid or [place] Health Choice; pregnant women with a Medicaid card; and children who have applied for Medicaid or [place] Health Choice, but were declined, whose parents can pay a reduced fee at time of service.  [facility] [place]  [address], [place] [phone] Accepts children up to age  38 who are enrolled in [place]icaid or [place] Health Choice; pregnant women with a Medicaid card; and children who have applied for Medicaid or [place] Health Choice, but were declined, whose parents can pay a reduced fee at time of service.  [facility]  [address], [place] [phone] Patients are seen by appointment only. Walk-ins are not accepted. [facility] will see patients 38 years of age and older. Monday - Tuesday (8am-5pm) Most Wednesdays (8:30-5pm) $30 per visit, cash only  [facility]  [address], [facility] [phone] Patients are seen by appointment only. Walk-ins are not accepted. [facility] will see patients 38 years of age and older. One Wednesday Evening (Monthly: Volunteer Based).  $30 per visit, cash only  [employer] [facility]  [phone] for adults; Children under age 734, call Graduate Pediatric Dentistry at [phone]. Children aged 334-14, please call [phone] to request a pediatric application.  Dental services are provided in all areas of dental care including fillings, crowns and bridges, complete and partial dentures, implants, gum treatment, root canals, and extractions. Preventive care is also provided. Treatment is provided to both adults and children. Patients are selected via a lottery and there is often a waiting list.   [facility] [address], [place]  [phone]) [phone] [email or web address]   [facility] [address], [place], [place] [phone], Ext. [phone] Second and Fourth Thursday of each month, opens at 6:30 AM; Clinic ends at 9 AM.  Patients are seen on a first-come first-served basis, and a limited number are seen during each clinic.   [facility]  [address], [place], [place] [phone]) [phone]   Eligibility Requirements You must have lived in [place], [place], or [place] counties for at least the last three  months.   You cannot be eligible for state or federal sponsored National Cityhealthcare insurance, including CIGNAVeterans Administration, [place]icaid, or [employer] EntertainmentMedicare.   You generally cannot be eligible for healthcare insurance through your employer.    How to apply: Eligibility screenings are held every Tuesday and Wednesday afternoon from 1:00 pm until 4:00 pm. You do not need an appointment for the interview!  [facility]  [facility] [address], [place], [place] [phone]   [facility]  [phone]   [facility]  [phone]   [facility]  [phone]    Behavioral Health Resources in the Community: Intensive Outpatient Programs Organization         Address  Phone  Notes  [facility] [address], [place], [place] [phone]   [facility] [address], [place], [place] [phone]   [facility] [address], [place], [place]  [phone]   [facility] [address],  [place], [place] [phone] or [phone]   Substance Abuse Resources Organization         Address  Phone  Notes  [facility]  [phone]   [facility]  [phone]   [facility]  [phone]   [facility]  [phone]   Residential & Outpatient Substance Abuse Program  [phone]   Psychological Services Organization         Address  Phone  Notes  [facility]  [phone]- [phone]   [facility]  [phone]   [facility] [address], [place] [phone] or [phone]    Mobile Crisis Teams Organization         Address  Phone  Notes  [facility], Mobile Crisis Care Unit  [phone]   [facility]  [address] [place], [place] [phone]   Doristine LocksSharon DeEsch [address] [place] [phone]    Self-Help/Support  Groups Organization         Address  Phone             Notes  [facility] [place] - variety of support groups  [phone] Call for more information  Narcotics Anonymous (NA), [facility] [address], [place]  2 meetings at this location   Statisticianesidential Treatment Programs Organization         Address  Phone  Notes  [facility] [address],    [place]  [phone]   [facility]  [address], [place] [phone], [place], [place] [phone]   [facility] [address], [place] [phone] Admissions: 8am-3pm M-F  [facility] [address]    [place], [place] [phone]   [facility] [address], [place], [place] [phone]   [facility] [address]  [place], [place] [phone]   [facility] - Intensive Outpatient [address], [place], [place] [phone]   [facility] ([facility] [address]  [place], [place] [phone] or [phone]   [facility] ([facility]) [address] [place], [place] [phone] Accepts Medicaid  [facility] [address]  [place] [phone] Substance Abuse/Addiction Treatment   [facility] Resources Organization         Address  Phone  Notes  [facility]  [phone]   Angie FavaJulie Brannon, PhD [address], [address] [place], [place]   [phone] or [phone]   [facility]   [address] [place], [place] [phone]   [facility] [address], [place], [place] [phone] Insurance/Medicaid/sponsorship through [facility]  Faith and Families [address]  [place], [place] [phone] [place], [place] [phone]    Dr. Adele Schilder  [phone]   [facility] Dept. 1) [address], [place] 2) [place] 3)  [address], [place] [phone]  [phone]   [place] [phone] or [phone] (After Hours)

## 2013-11-22 NOTE — Progress Notes (Signed)
  CARE MANAGEMENT ED NOTE 11/22/2013  Patient:  Salvatore MarvelCHILDRESS,Daymein   Account Number:  [id number]  Date Initiated:  11/22/2013  Documentation initiated by:  Radford PaxFERRERO,Marrion Accomando  Subjective/Objective Assessment:   Patient presents to Ed with possible athlete's foot     Subjective/Objective Assessment Detail:   patient has been to the ED 13 times in six months.  Was in ED 10/18, 10/19, 10/20, 10/21.     Action/Plan:   Action/Plan Detail:   Anticipated DC Date:  11/22/2013     Status Recommendation to Physician:   Result of Recommendation:    Other ED Services  Consult Working Plan    DC Planning Services  Other  PCP issues    Choice offered to / List presented to:            Status of service:  Completed, signed off  ED Comments:   ED Comments Detail:  EDCM spoke to patient at bedside.  Patient reports he is homeless.  Patient is [facility]liated with the [facility]. Patient rpeorts, "they have been very helpful to me.  I get my mail there, showers, I have my [place] ID, my address is the [facility]. I'm currently waiting for a phone call about a job. I'm actually looking into two jobs."  Patient confirms he does not have a pcp or insurnace.  [facility] informed patient that he may receive medical care at the [facility] and if he receives a RX, (if associated with the [facility]), the [facility] will send him to the family medicine of [place] where he can get his script filled for free.  Patient reports he was not aware of this.  EDCM educated patient that the ED is for emergencies only.  Patient remarked, "Well, if I ain't got no where to go, what am I supposed to do?  I can't sleep outside."  [facility] explained to patient that the ED is not a shelter.  EDCM called the [facility] who report they do not have any beds at this time.  Patient reports he call the [facility] every day.  EDCM provided patient with information on the [facility] Housing coalition to assist patient find more permanant housing, list of local shelters.  Patient  reports he eats lunch at the [employer].  EDCM provide patient with pamphlet to [facility], informed patient of services there and walk in times.  EDCM also provided patient with list of pcps who accept self pay patients, list of discount pharmacies and websites needymeds.org and GoodRX.com for medication assistance, phone number to inquire about the orange card, phone number to inquire about Mediciad, phone number to inquire about the Affordable Care Act, financial resources in the community such as local churches, salvation army, urban ministries, and dental assistance for uninsured patients. Patient thankfulf or resources.  No further EDCM needs at this time.

## 2013-11-22 NOTE — ED Notes (Signed)
Awaiting Tinactin from Rx; notified Rx

## 2013-11-22 NOTE — ED Notes (Signed)
Patient reports pain on bottom of right foot due to a popped blister.  He reports that he has athlete's foot and that his foot has been painful for "a while."

## 2013-11-26 ENCOUNTER — Encounter (HOSPITAL_COMMUNITY): Payer: Self-pay | Admitting: Emergency Medicine

## 2013-11-26 ENCOUNTER — Emergency Department (HOSPITAL_COMMUNITY)
Admission: EM | Admit: 2013-11-26 | Discharge: 2013-11-26 | Disposition: A | Discharge status: Home / Self Care | Payer: Self-pay | Attending: Emergency Medicine | Admitting: Emergency Medicine

## 2013-11-26 DIAGNOSIS — M25572 Pain in left ankle and joints of left foot: Secondary | ICD-10-CM | POA: Insufficient documentation

## 2013-11-26 DIAGNOSIS — Z590 Homelessness: Secondary | ICD-10-CM | POA: Insufficient documentation

## 2013-11-26 DIAGNOSIS — Z8679 Personal history of other diseases of the circulatory system: Secondary | ICD-10-CM | POA: Insufficient documentation

## 2013-11-26 DIAGNOSIS — M79672 Pain in left foot: Secondary | ICD-10-CM

## 2013-11-26 DIAGNOSIS — Z8659 Personal history of other mental and behavioral disorders: Secondary | ICD-10-CM | POA: Insufficient documentation

## 2013-11-26 DIAGNOSIS — Z720 Tobacco use: Secondary | ICD-10-CM | POA: Insufficient documentation

## 2013-11-26 DIAGNOSIS — Z79899 Other long term (current) drug therapy: Secondary | ICD-10-CM | POA: Insufficient documentation

## 2013-11-26 DIAGNOSIS — J45909 Unspecified asthma, uncomplicated: Secondary | ICD-10-CM | POA: Insufficient documentation

## 2013-11-26 MED ORDER — NAPROXEN 500 MG PO TABS: 500.0000 mg | ORAL_TABLET | Freq: Two times a day (BID) | ORAL | Status: DC

## 2013-11-26 MED ORDER — NAPROXEN 500 MG PO TABS
500.0000 mg | ORAL_TABLET | Freq: Once | ORAL | Status: AC
  Administered 2013-11-26: 500 mg via ORAL
  Filled 2013-11-26: qty 1

## 2013-11-26 NOTE — ED Notes (Signed)
Pt states that he has left foot toe pain; pt states that he was seen on 10/22 and diagnosed with Athletes Foot and states "the medicine isn't working"; pt c/o "peeling" to the toe area and continued pain

## 2013-11-26 NOTE — ED Provider Notes (Signed)
CSN: [id number]     Arrival date & time 11/26/13  2206 History  This chart was scribed for non-physician practitioner working with Dustin RazorStephen Kohut, MD by Dustin Norris, ED Scribe. This patient was seen in room WTR8/WTR8 and the patient's care was started at 10:45 PM.   Chief Complaint  Patient presents with  . Foot Pain   The history is provided by the patient. No language interpreter was used.   HPI Comments: Dustin Norris is a 38 y.o. male who presents to the Emergency Department complaining of unresolved foot pain. Patient evaluated 11/22/13, diagnosed with tinea pedis bilaterally, and discharged with Tinactin foot powder. Patient denies improvement with treatment reporting continued odor, scaling, peeling, swelling and pain at plantar surface. Patient is homeless and states that he walks a lot. Per his notes, patient changes his socks daily and uses baby powder to keep his feet dry.    Past Medical History  Diagnosis Date  . Anxiety   . Asthma   . Homelessness   . Migraine    History reviewed. No pertinent past surgical history. No family history on file. History  Substance Use Topics  . Smoking status: Current Every Day Smoker -- 2.00 packs/day for 5 years    Types: Cigarettes  . Smokeless tobacco: Not on file  . Alcohol Use: No    Review of Systems  Constitutional: Negative for fever, chills and activity change.  Musculoskeletal: Positive for myalgias. Negative for arthralgias, back pain, gait problem, joint swelling and neck pain.  Skin: Positive for color change. Negative for rash and wound.  Neurological: Negative for weakness and numbness.   Allergies  Review of patient's allergies indicates no known allergies.  Home Medications   Prior to Admission medications   Medication Sig Start Date End Date Taking? Authorizing Provider  nystatin (MYCOSTATIN/NYSTOP) 100000 UNIT/GM POWD Apply 1 g topically 2 (two) times daily.   Yes Historical Provider, MD  naproxen  (NAPROSYN) 500 MG tablet Take 1 tablet (500 mg total) by mouth 2 (two) times daily. 11/19/13   Antony MaduraKelly Humes, PA-C   Triage Vitals: BP 140/86  Temp(Src) 98.8 F (37.1 C) (Oral)  Resp 18  Ht 5\' 7"  (1.702 m)  Wt 150 lb (68.04 kg)  BMI 23.49 kg/m2  SpO2 99%  Physical Exam  Nursing note and vitals reviewed. Constitutional: He is oriented to person, place, and time. He appears well-developed and well-nourished. No distress.  HENT:  Head: Normocephalic and atraumatic.  Eyes: Conjunctivae and EOM are normal.  Neck: Normal range of motion. Neck supple. No tracheal deviation present.  Cardiovascular: Normal rate and normal pulses.   Pulmonary/Chest: Effort normal. No respiratory distress.  Musculoskeletal: Normal range of motion. He exhibits tenderness. He exhibits no edema.  Patient with tenderness plantar aspect L foot at base of toes. There is scaling between toes c/w tinea pedis. There is no significant cellulitis. Mild erythema.   Neurological: He is alert and oriented to person, place, and time. No sensory deficit.  Motor, sensation, and vascular distal to the injury is fully intact.   Skin: Skin is warm and dry.  Psychiatric: He has a normal mood and affect. His behavior is normal.    ED Course  Procedures (including critical care time)  COORDINATION OF CARE: 10:47 PM- Patient instructed to continue using Tinactin, but symptoms will take weeks to heal. Discussed treatment plan with patient at bedside and patient agreed to plan.   Labs Review Labs Reviewed - No data to display  Imaging  Review No results found.   EKG Interpretation None      Vital signs reviewed and are as follows: Filed Vitals:   11/26/13 2213  BP: 140/86  Temp: 98.8 F (37.1 C)  Resp: 18   Will give naproxen and continue NSAID for pain.   10:59 PM Pt urged to return with worsening pain, worsening swelling, expanding area of redness or streaking up extremity, fever, or any other concerns. Pt  verbalizes understanding and agrees with plan.   MDM   Final diagnoses:  Foot pain, left   Patient with pain associated with tinea pedis. There does not appear to be any significant cellulitis or secondary infection which would require additional imaging or antibiotics at this time. Will give NSAIDs for pain control. Patient to continue his nystatin powder.  I personally performed the services described in this documentation, which was scribed in my presence. The recorded information has been reviewed and is accurate.    Renne CriglerJoshua Han Lysne, PA-C 11/26/13 2300

## 2013-11-26 NOTE — Discharge Instructions (Signed)
Please read and follow all provided instructions.  Your diagnoses today include:  1. Foot pain, left    Tests performed today include:  Vital signs. See below for your results today.   Medications prescribed:   Naproxen - anti-inflammatory pain medication  Do not exceed 500mg  naproxen every 12 hours, take with food  You have been prescribed an anti-inflammatory medication or NSAID. Take with food. Take smallest effective dose for the shortest duration needed for your pain. Stop taking if you experience stomach pain or vomiting.   Take any prescribed medications only as directed.   Home care instructions:  Follow any educational materials contained in this packet. Keep affected area above the level of your heart when possible. Wash area gently twice a day with warm soapy water. Do not apply alcohol or hydrogen peroxide. Cover the area if it draining or weeping.   Follow-up instructions: Please follow-up with your primary care provider in the next 1 week for further evaluation of your symptoms.   Return instructions:  Return to the Emergency Department if you have:  Fever  Worsening symptoms  Worsening pain  Worsening swelling  Redness of the skin that moves away from the affected area, especially if it streaks away from the affected area   Any other emergent concerns  Your vital signs today were: BP 140/86   Temp(Src) 98.8 F (37.1 C) (Oral)   Resp 18   Ht 5\' 7"  (1.702 m)   Wt 150 lb (68.04 kg)   BMI 23.49 kg/m2   SpO2 99% If your blood pressure (BP) was elevated above 135/85 this visit, please have this repeated by your doctor within one month. --------------

## 2013-11-28 NOTE — ED Provider Notes (Signed)
Medical screening examination/treatment/procedure(s) were performed by non-physician practitioner and as supervising physician I was immediately available for consultation/collaboration.   EKG Interpretation None       Montoya Watkin, MD 11/28/13 1116 

## 2013-12-23 ENCOUNTER — Encounter (HOSPITAL_COMMUNITY): Payer: Self-pay | Admitting: Emergency Medicine

## 2013-12-23 ENCOUNTER — Emergency Department (HOSPITAL_COMMUNITY)
Admission: EM | Admit: 2013-12-23 | Discharge: 2013-12-24 | Disposition: A | Discharge status: Home / Self Care | Payer: Self-pay | Attending: Emergency Medicine | Admitting: Emergency Medicine

## 2013-12-23 DIAGNOSIS — Z590 Homelessness: Secondary | ICD-10-CM | POA: Insufficient documentation

## 2013-12-23 DIAGNOSIS — J45909 Unspecified asthma, uncomplicated: Secondary | ICD-10-CM | POA: Insufficient documentation

## 2013-12-23 DIAGNOSIS — Z79899 Other long term (current) drug therapy: Secondary | ICD-10-CM | POA: Insufficient documentation

## 2013-12-23 DIAGNOSIS — Z8659 Personal history of other mental and behavioral disorders: Secondary | ICD-10-CM | POA: Insufficient documentation

## 2013-12-23 DIAGNOSIS — Z791 Long term (current) use of non-steroidal anti-inflammatories (NSAID): Secondary | ICD-10-CM | POA: Insufficient documentation

## 2013-12-23 DIAGNOSIS — Z8679 Personal history of other diseases of the circulatory system: Secondary | ICD-10-CM | POA: Insufficient documentation

## 2013-12-23 DIAGNOSIS — G44209 Tension-type headache, unspecified, not intractable: Secondary | ICD-10-CM | POA: Insufficient documentation

## 2013-12-23 DIAGNOSIS — Z720 Tobacco use: Secondary | ICD-10-CM | POA: Insufficient documentation

## 2013-12-23 MED ORDER — KETOROLAC TROMETHAMINE 60 MG/2ML IM SOLN
60.0000 mg | Freq: Once | INTRAMUSCULAR | Status: DC
  Filled 2013-12-23: qty 2

## 2013-12-23 MED ORDER — ACETAMINOPHEN 500 MG PO TABS
1000.0000 mg | ORAL_TABLET | Freq: Once | ORAL | Status: AC
  Administered 2013-12-24: 1000 mg via ORAL
  Filled 2013-12-23: qty 2

## 2013-12-23 MED ORDER — ONDANSETRON 8 MG PO TBDP
8.0000 mg | ORAL_TABLET | Freq: Once | ORAL | Status: AC
  Administered 2013-12-23: 8 mg via ORAL
  Filled 2013-12-23: qty 1

## 2013-12-23 NOTE — ED Notes (Signed)
Resting quietly with eye closed. Easily arousable. Verbally responsive. Resp even and unlabored. ABC's intact. Pt refused Toradol IM stating "I don't want no shot. I'm scared of needles. I just want something to eat".

## 2013-12-23 NOTE — ED Notes (Signed)
Awake. Verbally responsive. Resp even and unlabored. ABC's intact. Reported headaches without visual disturbances and N/V episodes.

## 2013-12-23 NOTE — ED Provider Notes (Signed)
CSN: [id number]     Arrival date & time 12/23/13  2134 History   First MD Initiated Contact with Patient 12/23/13 2257     Chief Complaint  Patient presents with  . Headache     (Consider location/radiation/quality/duration/timing/severity/associated sxs/prior Treatment) The history is provided by the patient and medical records. No language interpreter was used.     Dustin Norris is a 38 y.o. male  with a hx of migraine headaches presents to the Emergency Department complaining of gradual, persistent, progressively worsening generalized headache, throbbing in nature and rated at an 8/10 onset this morning.  Pt reports he usually drinks a cup of coffee and takes tylenol with resolution of his headache, but he did not try that this time.  Pt reports he has associated nausea without vomiting and this is normal for his headache.  Pt reports mild photophobia and nothing seems to make the pain worse.  Pt denies fever, chills, headache, neck pain, chest pain, SOB, abd pain, V/D, weakness, dizziness, syncope.  Patient reports he gets headaches when he feels stressed and he reports a significant amount of stress in his life recently.   Past Medical History  Diagnosis Date  . Anxiety   . Asthma   . Homelessness   . Migraine    History reviewed. No pertinent past surgical history. No family history on file. History  Substance Use Topics  . Smoking status: Current Every Day Smoker -- 2.00 packs/day for 5 years    Types: Cigarettes  . Smokeless tobacco: Not on file  . Alcohol Use: No    Review of Systems  Constitutional: Negative for fever, diaphoresis, appetite change, fatigue and unexpected weight change.  HENT: Negative for mouth sores.   Eyes: Positive for photophobia. Negative for visual disturbance.  Respiratory: Negative for cough, chest tightness, shortness of breath and wheezing.   Cardiovascular: Negative for chest pain.  Gastrointestinal: Positive for nausea. Negative for  vomiting, abdominal pain, diarrhea and constipation.  Endocrine: Negative for polydipsia, polyphagia and polyuria.  Genitourinary: Negative for dysuria, urgency, frequency and hematuria.  Musculoskeletal: Negative for back pain and neck stiffness.  Skin: Negative for rash.  Allergic/Immunologic: Negative for immunocompromised state.  Neurological: Positive for headaches. Negative for syncope and light-headedness.  Hematological: Does not bruise/bleed easily.  Psychiatric/Behavioral: Negative for sleep disturbance. The patient is not nervous/anxious.       Allergies  Review of patient's allergies indicates no known allergies.  Home Medications   Prior to Admission medications   Medication Sig Start Date End Date Taking? Authorizing Provider  naproxen (NAPROSYN) 500 MG tablet Take 1 tablet (500 mg total) by mouth 2 (two) times daily. 11/26/13  Yes Renne CriglerJoshua Geiple, PA-C  nystatin (MYCOSTATIN/NYSTOP) 100000 UNIT/GM POWD Apply 1 g topically 2 (two) times daily.    Historical Provider, MD   BP 99/51 mmHg  Pulse 72  Temp(Src) 98.1 F (36.7 C)  Resp 18  Ht 5\' 7"  (1.702 m)  Wt 146 lb (66.225 kg)  BMI 22.86 kg/m2  SpO2 98% Physical Exam  Constitutional: He is oriented to person, place, and time. He appears well-developed and well-nourished. No distress.  HENT:  Head: Normocephalic and atraumatic.  Mouth/Throat: Oropharynx is clear and moist.  Eyes: Conjunctivae and EOM are normal. Pupils are equal, round, and reactive to light. No scleral icterus.  No horizontal, vertical or rotational nystagmus  Neck: Normal range of motion. Neck supple.  Full active and passive ROM without pain No midline or paraspinal tenderness No nuchal rigidity  or meningeal signs  Cardiovascular: Normal rate, regular rhythm, normal heart sounds and intact distal pulses.   No murmur heard. Pulmonary/Chest: Effort normal and breath sounds normal. No respiratory distress. He has no wheezes. He has no rales.   Clear and equal breath sounds  Abdominal: Soft. Bowel sounds are normal. There is no tenderness. There is no rebound and no guarding.  Abdomen soft and nontender  Musculoskeletal: Normal range of motion.  Lymphadenopathy:    He has no cervical adenopathy.  Neurological: He is alert and oriented to person, place, and time. He has normal reflexes. No cranial nerve deficit. He exhibits normal muscle tone. Coordination normal.  Mental Status:  Alert, oriented, thought content appropriate. Speech fluent without evidence of aphasia. Able to follow 2 step commands without difficulty.  Cranial Nerves:  II:  Peripheral visual fields grossly normal, pupils equal, round, reactive to light III,IV, VI: ptosis not present, extra-ocular motions intact bilaterally  V,VII: smile symmetric, facial light touch sensation equal VIII: hearing grossly normal bilaterally  IX,X: gag reflex present  XI: bilateral shoulder shrug equal and strong XII: midline tongue extension  Motor:  5/5 in upper and lower extremities bilaterally including strong and equal grip strength and dorsiflexion/plantar flexion Sensory: Pinprick and light touch normal in all extremities.  Deep Tendon Reflexes: 2+ and symmetric  Cerebellar: normal finger-to-nose with bilateral upper extremities Gait: normal gait and balance CV: distal pulses palpable throughout   Skin: Skin is warm and dry. No rash noted. He is not diaphoretic.  Psychiatric: He has a normal mood and affect. His behavior is normal. Judgment and thought content normal.  Nursing note and vitals reviewed.   ED Course  Procedures (including critical care time) Labs Review Labs Reviewed - No data to display  Imaging Review No results found.   EKG Interpretation None      MDM   Final diagnoses:  Tension-type headache, not intractable, unspecified chronicity pattern   Dustin Norris presents with migraine similar to previous migraines with known trigger.  Patient has not tried any factors to alleviate his migraine prior to arrival. Will give coffee and Toradol.  12:10AM Patient refuses Toradol because he is "afraid of needles."  Patient ordered Tylenol for his headache. He is demanding thing which coffee which he may have.   Pt HA treated and improved while in ED.  Presentation is like pts typical HA and non concerning [facility], ICH, Meningitis, or temporal arteritis. Pt is afebrile with no focal neuro deficits, nuchal rigidity, or change in vision. Pt is to follow up with PCP. Pt verbalizes understanding and is agreeable with plan to dc.   I have personally reviewed patient's vitals, nursing note and any pertinent labs or imaging.  I performed an undressed physical exam.    It has been determined that no acute conditions requiring further emergency intervention are present at this time. The patient/guardian have been advised of the diagnosis and plan. I reviewed all labs and imaging including any potential incidental findings. We have discussed signs and symptoms that warrant return to the ED and they are listed in the discharge instructions.    Vital signs are stable at discharge.   BP 99/51 mmHg  Pulse 72  Temp(Src) 98.1 F (36.7 C)  Resp 18  Ht 5\' 7"  (1.702 m)  Wt 146 lb (66.225 kg)  BMI 22.86 kg/m2  SpO2 98%         Dierdre ForthHannah Dani Danis, PA-C 12/24/13 0027  Rolland PorterMark James, MD 12/27/13 [phone]

## 2013-12-23 NOTE — ED Notes (Signed)
Pt c/o HA onset this AM, pt states feel like normal HA, pt states he did not have any Tylenol. Denies n/v

## 2013-12-23 NOTE — Discharge Instructions (Signed)
1. Medications: usual home medications 2. Treatment: rest, drink plenty of fluids,  3. Follow Up: Please followup with your primary doctor in 3 days for discussion of your diagnoses and further evaluation after today's visit; if you do not have a primary care doctor use the resource guide provided to find one; Please return to the ER for vision changes or other concerning symptoms   Headaches, Frequently Asked Questions MIGRAINE HEADACHES Q: What is migraine? What causes it? How can I treat it? A: Generally, migraine headaches begin as a dull ache. Then they develop into a constant, throbbing, and pulsating pain. You may experience pain at the temples. You may experience pain at the front or back of one or both sides of the head. The pain is usually accompanied by a combination of:  Nausea.  Vomiting.  Sensitivity to light and noise. Some people (about 15%) experience an aura (see below) before an attack. The cause of migraine is believed to be chemical reactions in the brain. Treatment for migraine may include over-the-counter or prescription medications. It may also include self-help techniques. These include relaxation training and biofeedback.  Q: What is an aura? A: About 15% of people with migraine get an "aura". This is a sign of neurological symptoms that occur before a migraine headache. You may see wavy or jagged lines, dots, or flashing lights. You might experience tunnel vision or blind spots in one or both eyes. The aura can include visual or auditory hallucinations (something imagined). It may include disruptions in smell (such as strange odors), taste or touch. Other symptoms include:  Numbness.  A "pins and needles" sensation.  Difficulty in recalling or speaking the correct word. These neurological events may last as long as 60 minutes. These symptoms will fade as the headache begins. Q: What is a trigger? A: Certain physical or environmental factors can lead to or  "trigger" a migraine. These include:  Foods.  Hormonal changes.  Weather.  Stress. It is important to remember that triggers are different for everyone. To help prevent migraine attacks, you need to figure out which triggers affect you. Keep a headache diary. This is a good way to track triggers. The diary will help you talk to your healthcare professional about your condition. Q: Does weather affect migraines? A: Bright sunshine, hot, humid conditions, and drastic changes in barometric pressure may lead to, or "trigger," a migraine attack in some people. But studies have shown that weather does not act as a trigger for everyone with migraines. Q: What is the link between migraine and hormones? A: Hormones start and regulate many of your body's functions. Hormones keep your body in balance within a constantly changing environment. The levels of hormones in your body are unbalanced at times. Examples are during menstruation, pregnancy, or menopause. That can lead to a migraine attack. In fact, about three quarters of all women with migraine report that their attacks are related to the menstrual cycle.  Q: Is there an increased risk of stroke for migraine sufferers? A: The likelihood of a migraine attack causing a stroke is very remote. That is not to say that migraine sufferers cannot have a stroke associated with their migraines. In persons under age 38, the most common associated factor for stroke is migraine headache. But over the course of a person's normal life span, the occurrence of migraine headache may actually be associated with a reduced risk of dying from cerebrovascular disease due to stroke.  Q: What are acute medications for  migraine? A: Acute medications are used to treat the pain of the headache after it has started. Examples over-the-counter medications, NSAIDs, ergots, and triptans.  Q: What are the triptans? A: Triptans are the newest class of abortive medications. They are  specifically targeted to treat migraine. Triptans are vasoconstrictors. They moderate some chemical reactions in the brain. The triptans work on receptors in your brain. Triptans help to restore the balance of a neurotransmitter called serotonin. Fluctuations in levels of serotonin are thought to be a main cause of migraine.  Q: Are over-the-counter medications for migraine effective? A: Over-the-counter, or "OTC," medications may be effective in relieving mild to moderate pain and associated symptoms of migraine. But you should see your caregiver before beginning any treatment regimen for migraine.  Q: What are preventive medications for migraine? A: Preventive medications for migraine are sometimes referred to as "prophylactic" treatments. They are used to reduce the frequency, severity, and length of migraine attacks. Examples of preventive medications include antiepileptic medications, antidepressants, beta-blockers, calcium channel blockers, and NSAIDs (nonsteroidal anti-inflammatory drugs). Q: Why are anticonvulsants used to treat migraine? A: During the past few years, there has been an increased interest in antiepileptic drugs for the prevention of migraine. They are sometimes referred to as "anticonvulsants". Both epilepsy and migraine may be caused by similar reactions in the brain.  Q: Why are antidepressants used to treat migraine? A: Antidepressants are typically used to treat people with depression. They may reduce migraine frequency by regulating chemical levels, such as serotonin, in the brain.  Q: What alternative therapies are used to treat migraine? A: The term "alternative therapies" is often used to describe treatments considered outside the scope of conventional Western medicine. Examples of alternative therapy include acupuncture, acupressure, and yoga. Another common alternative treatment is herbal therapy. Some herbs are believed to relieve headache pain. Always discuss alternative  therapies with your caregiver before proceeding. Some herbal products contain arsenic and other toxins. TENSION HEADACHES Q: What is a tension-type headache? What causes it? How can I treat it? A: Tension-type headaches occur randomly. They are often the result of temporary stress, anxiety, fatigue, or anger. Symptoms include soreness in your temples, a tightening band-like sensation around your head (a "vice-like" ache). Symptoms can also include a pulling feeling, pressure sensations, and contracting head and neck muscles. The headache begins in your forehead, temples, or the back of your head and neck. Treatment for tension-type headache may include over-the-counter or prescription medications. Treatment may also include self-help techniques such as relaxation training and biofeedback. CLUSTER HEADACHES Q: What is a cluster headache? What causes it? How can I treat it? A: Cluster headache gets its name because the attacks come in groups. The pain arrives with little, if any, warning. It is usually on one side of the head. A tearing or bloodshot eye and a runny nose on the same side of the headache may also accompany the pain. Cluster headaches are believed to be caused by chemical reactions in the brain. They have been described as the most severe and intense of any headache type. Treatment for cluster headache includes prescription medication and oxygen. SINUS HEADACHES Q: What is a sinus headache? What causes it? How can I treat it? A: When a cavity in the bones of the face and skull (a sinus) becomes inflamed, the inflammation will cause localized pain. This condition is usually the result of an allergic reaction, a tumor, or an infection. If your headache is caused by a sinus blockage, such  as an infection, you will probably have a fever. An x-ray will confirm a sinus blockage. Your caregiver's treatment might include antibiotics for the infection, as well as antihistamines or decongestants.    REBOUND HEADACHES Q: What is a rebound headache? What causes it? How can I treat it? A: A pattern of taking acute headache medications too often can lead to a condition known as "rebound headache." A pattern of taking too much headache medication includes taking it more than 2 days per week or in excessive amounts. That means more than the label or a caregiver advises. With rebound headaches, your medications not only stop relieving pain, they actually begin to cause headaches. Doctors treat rebound headache by tapering the medication that is being overused. Sometimes your caregiver will gradually substitute a different type of treatment or medication. Stopping may be a challenge. Regularly overusing a medication increases the potential for serious side effects. Consult a caregiver if you regularly use headache medications more than 2 days per week or more than the label advises. ADDITIONAL QUESTIONS AND ANSWERS Q: What is biofeedback? A: Biofeedback is a self-help treatment. Biofeedback uses special equipment to monitor your body's involuntary physical responses. Biofeedback monitors:  Breathing.  Pulse.  Heart rate.  Temperature.  Muscle tension.  Brain activity. Biofeedback helps you refine and perfect your relaxation exercises. You learn to control the physical responses that are related to stress. Once the technique has been mastered, you do not need the equipment any more. Q: Are headaches hereditary? A: Four out of five (80%) of people that suffer report a family history of migraine. Scientists are not sure if this is genetic or a family predisposition. Despite the uncertainty, a child has a 50% chance of having migraine if one parent suffers. The child has a 75% chance if both parents suffer.  Q: Can children get headaches? A: By the time they reach high school, most young people have experienced some type of headache. Many safe and effective approaches or medications can prevent a  headache from occurring or stop it after it has begun.  Q: What type of doctor should I see to diagnose and treat my headache? A: Start with your primary caregiver. Discuss his or her experience and approach to headaches. Discuss methods of classification, diagnosis, and treatment. Your caregiver may decide to recommend you to a headache specialist, depending upon your symptoms or other physical conditions. Having diabetes, allergies, etc., may require a more comprehensive and inclusive approach to your headache. The National Headache Foundation will provide, upon request, a list of [facility] physician members in your state. Document Released: 04/10/2003 Document Revised: 04/12/2011 Document Reviewed: 09/18/2007 [facility] Patient Information 2015 [place], [place]. This information is not intended to replace advice given to you by your health care provider. Make sure you discuss any questions you have with your health care provider.

## 2013-12-24 NOTE — ED Notes (Signed)
Pt given sandwich and coffee.

## 2013-12-24 NOTE — ED Notes (Signed)
Awake. Verbally responsive. Resp even and unlabored. ABC's intact. NAD noted. 

## 2014-04-28 ENCOUNTER — Emergency Department (HOSPITAL_COMMUNITY): Payer: Self-pay

## 2014-04-28 ENCOUNTER — Emergency Department (HOSPITAL_COMMUNITY)
Admission: EM | Admit: 2014-04-28 | Discharge: 2014-04-29 | Disposition: A | Discharge status: Home / Self Care | Payer: Self-pay | Attending: Emergency Medicine | Admitting: Emergency Medicine

## 2014-04-28 ENCOUNTER — Encounter (HOSPITAL_COMMUNITY): Payer: Self-pay | Admitting: Emergency Medicine

## 2014-04-28 DIAGNOSIS — Z8659 Personal history of other mental and behavioral disorders: Secondary | ICD-10-CM | POA: Insufficient documentation

## 2014-04-28 DIAGNOSIS — Y998 Other external cause status: Secondary | ICD-10-CM | POA: Insufficient documentation

## 2014-04-28 DIAGNOSIS — J45909 Unspecified asthma, uncomplicated: Secondary | ICD-10-CM | POA: Insufficient documentation

## 2014-04-28 DIAGNOSIS — R52 Pain, unspecified: Secondary | ICD-10-CM

## 2014-04-28 DIAGNOSIS — S0993XA Unspecified injury of face, initial encounter: Secondary | ICD-10-CM | POA: Insufficient documentation

## 2014-04-28 DIAGNOSIS — Y9389 Activity, other specified: Secondary | ICD-10-CM | POA: Insufficient documentation

## 2014-04-28 DIAGNOSIS — Z590 Homelessness: Secondary | ICD-10-CM | POA: Insufficient documentation

## 2014-04-28 DIAGNOSIS — Z791 Long term (current) use of non-steroidal anti-inflammatories (NSAID): Secondary | ICD-10-CM | POA: Insufficient documentation

## 2014-04-28 DIAGNOSIS — Z8679 Personal history of other diseases of the circulatory system: Secondary | ICD-10-CM | POA: Insufficient documentation

## 2014-04-28 DIAGNOSIS — S3991XA Unspecified injury of abdomen, initial encounter: Secondary | ICD-10-CM | POA: Insufficient documentation

## 2014-04-28 DIAGNOSIS — Z720 Tobacco use: Secondary | ICD-10-CM | POA: Insufficient documentation

## 2014-04-28 DIAGNOSIS — Y9289 Other specified places as the place of occurrence of the external cause: Secondary | ICD-10-CM | POA: Insufficient documentation

## 2014-04-28 DIAGNOSIS — Z79899 Other long term (current) drug therapy: Secondary | ICD-10-CM | POA: Insufficient documentation

## 2014-04-28 LAB — CBC WITH DIFFERENTIAL/PLATELET
Basophils Absolute: 0 10*3/uL (ref 0.0–0.1)
Basophils Relative: 0 % (ref 0–1)
Eosinophils Absolute: 0 10*3/uL (ref 0.0–0.7)
Eosinophils Relative: 0 % (ref 0–5)
HCT: 43.7 % (ref 39.0–52.0)
Hemoglobin: 14.2 g/dL (ref 13.0–17.0)
LYMPHS PCT: 23 % (ref 12–46)
Lymphs Abs: 1.1 10*3/uL (ref 0.7–4.0)
MCH: 29.3 pg (ref 26.0–34.0)
MCHC: 32.5 g/dL (ref 30.0–36.0)
MCV: 90.3 fL (ref 78.0–100.0)
Monocytes Absolute: 0.5 10*3/uL (ref 0.1–1.0)
Monocytes Relative: 9 % (ref 3–12)
NEUTROS ABS: 3.4 10*3/uL (ref 1.7–7.7)
Neutrophils Relative %: 68 % (ref 43–77)
Platelets: 294 10*3/uL (ref 150–400)
RBC: 4.84 MIL/uL (ref 4.22–5.81)
RDW: 14.3 % (ref 11.5–15.5)
WBC: 5 10*3/uL (ref 4.0–10.5)

## 2014-04-28 LAB — COMPREHENSIVE METABOLIC PANEL
ALK PHOS: 57 U/L (ref 39–117)
ALT: 13 U/L (ref 0–53)
AST: 17 U/L (ref 0–37)
Albumin: 4.2 g/dL (ref 3.5–5.2)
Anion gap: 8 (ref 5–15)
BUN: 13 mg/dL (ref 6–23)
CO2: 25 mmol/L (ref 19–32)
Calcium: 8.9 mg/dL (ref 8.4–10.5)
Chloride: 106 mmol/L (ref 96–112)
Creatinine, Ser: 0.98 mg/dL (ref 0.50–1.35)
GFR calc non Af Amer: >90 mL/min (ref >90)
Glucose, Bld: 101 mg/dL — ABNORMAL HIGH (ref 70–99)
Potassium: 4.2 mmol/L (ref 3.5–5.1)
SODIUM: 139 mmol/L (ref 135–145)
Total Bilirubin: 0.3 mg/dL (ref 0.3–1.2)
Total Protein: 7 g/dL (ref 6.0–8.3)

## 2014-04-28 LAB — LIPASE, BLOOD: Lipase: 19 U/L (ref 11–59)

## 2014-04-28 MED ORDER — IOHEXOL 300 MG/ML  SOLN
100.0000 mL | Freq: Once | INTRAMUSCULAR | Status: AC | PRN
  Administered 2014-04-28: 100 mL via INTRAVENOUS

## 2014-04-28 MED ORDER — ACETAMINOPHEN 500 MG PO TABS
1000.0000 mg | ORAL_TABLET | Freq: Once | ORAL | Status: AC
  Administered 2014-04-29: 1000 mg via ORAL
  Filled 2014-04-28: qty 2

## 2014-04-28 NOTE — ED Notes (Signed)
Awake. Verbally responsive. A/O x4. Resp even and unlabored. No audible adventitious breath sounds noted. ABC's intact. Dr. Ethelda ChickJacubowitz at bedside. Pt given sandwich and drink.
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
## 2014-04-28 NOTE — ED Notes (Signed)
Pt aware urine sample is needed, urinal given, Pt states he just wants to go home.
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
## 2014-04-28 NOTE — ED Notes (Signed)
Dr. Ethelda ChickJacubowitz was made aware again of pt refusing IV x2. MD at bedside and pt again agreed to IV. Will have another nurse to start IV.

## 2014-04-28 NOTE — ED Notes (Signed)
Requested by primary nurse to restart IV and speak with patient. Introduced myself to patient, explained why IV was needed for CT scan. Pt states cursing at staff stating "I dont even want this shit" confirmed that patient was refusing IV and CT scan, pt confirms he does not want these procedures.

## 2014-04-28 NOTE — ED Notes (Signed)
Resting quietly with eye closed. Easily arousable. Verbally responsive. Resp even and unlabored. ABC's intact. NAD noted.  

## 2014-04-28 NOTE — ED Notes (Addendum)
Dr. Ethelda ChickJacubowitz was made aware of pt refusing IV stick again and after he spoke to pt he agreed.
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
## 2014-04-28 NOTE — ED Notes (Signed)
Awake. Verbally responsive. Resp even and unlabored. No audible adventitious breath sounds noted. ABC's intact. Abd soft/nondistended but tender to palpate. BS (+) and active x4 quadrants. No N/V/D reported. 

## 2014-04-29 NOTE — ED Notes (Signed)
Awake. Verbally responsive. A/O x4. Resp even and unlabored. No audible adventitious breath sounds noted. ABC's intact. Pt given heating pad for arm.

## 2014-04-29 NOTE — Discharge Instructions (Signed)
Take Tylenol as directed for pain. Hold a warm compress over your left forearm 4 times daily for 30 minutes at a time. Call the [facility] wellness [facility] go to an urgent care if you have continued pain within the next 3 or 4 days

## 2015-02-22 ENCOUNTER — Emergency Department (HOSPITAL_COMMUNITY)
Admission: EM | Admit: 2015-02-22 | Discharge: 2015-02-22 | Disposition: A | Discharge status: Other Acute Inpatient Hospital | Payer: Self-pay | Attending: Emergency Medicine | Admitting: Emergency Medicine

## 2015-02-22 ENCOUNTER — Encounter (HOSPITAL_COMMUNITY): Payer: Self-pay | Admitting: Emergency Medicine

## 2015-02-22 ENCOUNTER — Emergency Department (HOSPITAL_COMMUNITY): Payer: Self-pay

## 2015-02-22 ENCOUNTER — Encounter (HOSPITAL_COMMUNITY): Payer: Self-pay

## 2015-02-22 ENCOUNTER — Emergency Department (HOSPITAL_COMMUNITY)
Admission: EM | Admit: 2015-02-22 | Discharge: 2015-02-22 | Disposition: A | Discharge status: Home / Self Care | Payer: Self-pay | Attending: Emergency Medicine | Admitting: Emergency Medicine

## 2015-02-22 ENCOUNTER — Observation Stay (HOSPITAL_COMMUNITY)
Admission: AD | Admit: 2015-02-22 | Discharge: 2015-02-23 | Disposition: A | Discharge status: Home / Self Care | Payer: No Typology Code available for payment source | Source: Intra-hospital | Attending: Psychiatry | Admitting: Psychiatry

## 2015-02-22 DIAGNOSIS — F329 Major depressive disorder, single episode, unspecified: Secondary | ICD-10-CM | POA: Insufficient documentation

## 2015-02-22 DIAGNOSIS — Z590 Homelessness: Secondary | ICD-10-CM | POA: Insufficient documentation

## 2015-02-22 DIAGNOSIS — G43909 Migraine, unspecified, not intractable, without status migrainosus: Secondary | ICD-10-CM | POA: Insufficient documentation

## 2015-02-22 DIAGNOSIS — G4700 Insomnia, unspecified: Secondary | ICD-10-CM | POA: Insufficient documentation

## 2015-02-22 DIAGNOSIS — F419 Anxiety disorder, unspecified: Secondary | ICD-10-CM | POA: Diagnosis not present

## 2015-02-22 DIAGNOSIS — F1490 Cocaine use, unspecified, uncomplicated: Secondary | ICD-10-CM

## 2015-02-22 DIAGNOSIS — F22 Delusional disorders: Principal | ICD-10-CM | POA: Diagnosis present

## 2015-02-22 DIAGNOSIS — J45909 Unspecified asthma, uncomplicated: Secondary | ICD-10-CM | POA: Insufficient documentation

## 2015-02-22 DIAGNOSIS — Z8679 Personal history of other diseases of the circulatory system: Secondary | ICD-10-CM | POA: Insufficient documentation

## 2015-02-22 DIAGNOSIS — Z79899 Other long term (current) drug therapy: Secondary | ICD-10-CM | POA: Insufficient documentation

## 2015-02-22 DIAGNOSIS — F1410 Cocaine abuse, uncomplicated: Secondary | ICD-10-CM | POA: Insufficient documentation

## 2015-02-22 DIAGNOSIS — F17210 Nicotine dependence, cigarettes, uncomplicated: Secondary | ICD-10-CM | POA: Insufficient documentation

## 2015-02-22 DIAGNOSIS — J45901 Unspecified asthma with (acute) exacerbation: Secondary | ICD-10-CM | POA: Insufficient documentation

## 2015-02-22 LAB — BASIC METABOLIC PANEL
Anion gap: 11 (ref 5–15)
BUN: 13 mg/dL (ref 6–20)
CO2: 25 mmol/L (ref 22–32)
Calcium: 9.6 mg/dL (ref 8.9–10.3)
Chloride: 105 mmol/L (ref 101–111)
Creatinine, Ser: 1.09 mg/dL (ref 0.61–1.24)
GFR calc Af Amer: >60 mL/min (ref >60)
GLUCOSE: 121 mg/dL — AB (ref 65–99)
POTASSIUM: 3.7 mmol/L (ref 3.5–5.1)
SODIUM: 141 mmol/L (ref 135–145)

## 2015-02-22 LAB — CBC
HEMATOCRIT: 41.1 % (ref 39.0–52.0)
Hemoglobin: 13.5 g/dL (ref 13.0–17.0)
MCH: 29.3 pg (ref 26.0–34.0)
MCHC: 32.8 g/dL (ref 30.0–36.0)
MCV: 89.3 fL (ref 78.0–100.0)
Platelets: 281 10*3/uL (ref 150–400)
RBC: 4.6 MIL/uL (ref 4.22–5.81)
RDW: 14 % (ref 11.5–15.5)
WBC: 8.2 10*3/uL (ref 4.0–10.5)

## 2015-02-22 LAB — RAPID URINE DRUG SCREEN, HOSP PERFORMED
Amphetamines: NOT DETECTED
BARBITURATES: NOT DETECTED
BENZODIAZEPINES: NOT DETECTED
COCAINE: POSITIVE — AB
Opiates: NOT DETECTED
TETRAHYDROCANNABINOL: NOT DETECTED

## 2015-02-22 LAB — ETHANOL

## 2015-02-22 LAB — ACETAMINOPHEN LEVEL: Acetaminophen (Tylenol), Serum: <10 ug/mL — ABNORMAL LOW (ref 10–30)

## 2015-02-22 LAB — I-STAT TROPONIN, ED: Troponin i, poc: 0.04 ng/mL (ref 0.00–0.08)

## 2015-02-22 LAB — SALICYLATE LEVEL

## 2015-02-22 MED ORDER — LORAZEPAM 1 MG PO TABS
1.0000 mg | ORAL_TABLET | Freq: Three times a day (TID) | ORAL | Status: DC | PRN
  Administered 2015-02-22: 1 mg via ORAL
  Filled 2015-02-22: qty 1

## 2015-02-22 MED ORDER — TRAZODONE HCL 50 MG PO TABS: 50.0000 mg | ORAL_TABLET | Freq: Every evening | ORAL | Status: DC | PRN

## 2015-02-22 MED ORDER — ONDANSETRON HCL 4 MG PO TABS: 4.0000 mg | ORAL_TABLET | Freq: Three times a day (TID) | ORAL | Status: DC | PRN

## 2015-02-22 MED ORDER — NICOTINE 21 MG/24HR TD PT24: 21.0000 mg | MEDICATED_PATCH | Freq: Every day | TRANSDERMAL | Status: DC

## 2015-02-22 MED ORDER — ALUM & MAG HYDROXIDE-SIMETH 200-200-20 MG/5ML PO SUSP: 30.0000 mL | ORAL | Status: DC | PRN

## 2015-02-22 MED ORDER — ACETAMINOPHEN 325 MG PO TABS: 650.0000 mg | ORAL_TABLET | ORAL | Status: DC | PRN

## 2015-02-22 MED ORDER — ZOLPIDEM TARTRATE 5 MG PO TABS: 5.0000 mg | ORAL_TABLET | Freq: Every evening | ORAL | Status: DC | PRN

## 2015-02-22 MED ORDER — LORAZEPAM 2 MG/ML IJ SOLN
0.5000 mg | Freq: Once | INTRAMUSCULAR | Status: AC
  Administered 2015-02-22: 0.5 mg via INTRAVENOUS
  Filled 2015-02-22: qty 1

## 2015-02-22 MED ORDER — IBUPROFEN 200 MG PO TABS: 600.0000 mg | ORAL_TABLET | Freq: Three times a day (TID) | ORAL | Status: DC | PRN

## 2015-02-22 NOTE — ED Notes (Signed)
TTS at bedside. 
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
## 2015-02-22 NOTE — Discharge Instructions (Signed)
Panic Attacks °Panic attacks are sudden, short feelings of great fear or discomfort. You may have them for no reason when you are relaxed, when you are uneasy (anxious), or when you are sleeping.  °HOME CARE °· Take all your medicines as told. °· Check with your doctor before starting new medicines. °· Keep all doctor visits. °GET HELP IF: °· You are not able to take your medicines as told. °· Your symptoms do not get better. °· Your symptoms get worse. °GET HELP RIGHT AWAY IF: °· Your attacks seem different than your normal attacks. °· You have thoughts about hurting yourself or others. °· You take panic attack medicine and you have a side effect. °MAKE SURE YOU: °· Understand these instructions. °· Will watch your condition. °· Will get help right away if you are not doing well or get worse. °  °This information is not intended to replace advice given to you by your health care provider. Make sure you discuss any questions you have with your health care provider. °  °Document Released: 02/20/2010 Document Revised: 11/08/2012 Document Reviewed: 09/01/2012 °Elsevier Interactive Patient Education ©2016 Elsevier Inc. ° ° °Emergency Department Resource Guide °1) Find a Doctor and Pay Out of Pocket °Although you won't have to find out who is covered by your insurance plan, it is a good idea to ask around and get recommendations. You will then need to call the office and see if the doctor you have chosen will accept you as a new patient and what types of options they offer for patients who are self-pay. Some doctors offer discounts or will set up payment plans for their patients who do not have insurance, but you will need to ask so you aren't surprised when you get to your appointment. ° °2) Contact Your Local Health Department °Not all health departments have doctors that can see patients for sick visits, but many do, so it is worth a call to see if yours does. If you don't know where your local health department is,  you can check in your phone book. The CDC also has a tool to help you locate your state's health department, and many state websites also have listings of all of their local health departments. ° °3) Find a Walk-in Clinic °If your illness is not likely to be very severe or complicated, you may want to try a walk in clinic. These are popping up all over the country in pharmacies, drugstores, and shopping centers. They're usually staffed by nurse practitioners or physician assistants that have been trained to treat common illnesses and complaints. They're usually fairly quick and inexpensive. However, if you have serious medical issues or chronic medical problems, these are probably not your best option. ° °No Primary Care Doctor: °- Call [facility] Connect at  832-8000 - they can help you locate a primary care doctor that  accepts your insurance, provides certain services, etc. °- Physician Referral Service- 1-800-533-3463 ° °Chronic Pain Problems: °Organization         Address  Phone   Notes  °[facility]  (336) 297-2271 Patients need to be referred by their primary care doctor.  ° °Medication Assistance: °Organization         Address  Phone   Notes  °[place] [facility] [address] °, [place] [address] (336) 641-8030 --Must be a resident of [place] °-- Must have NO insurance coverage whatsoever (no Medicaid/ Medicare, etc.) °-- The pt. MUST   have a primary care doctor that directs their care regularly and follows them in the community °  °[facility]  (866) 331-1348   °[employer]  (888) 892-1162   ° °Agencies that provide inexpensive medical care: °Organization         Address  Phone   Notes  °[facility]  (336) 832-8035   °[facility]    (336) 832-7272   °[facility] [address] °[place], [place] [address] (336) 832-4777   °[facility] [address], °[place] (336)  271-4999   °[facility]    (336) 373-0678   °[facility]    (336) 272-1050   °[facility] ° [address], [place] Phone:  (336) 832-4444, Fax:  (336) 832-4440 Hours of Operation:  9 am - 6 pm, M-F.  Also accepts Medicaid/Medicare and self-pay.  °[facility] ° [address], [place] Phone: (336) 832-3150, Fax: (336) [phone]. Hours of Operation:  8:30 am - 5:30 pm, M-F.  Also accepts Medicaid and self-pay.  °[facility] [place] [address], [place] Phone: (336) 878-6027   °[facility] [address], [place], [place] (336)723-1848, Ext. 123 Mondays & Thursdays: 7-9 AM.  First 15 patients are seen on a first come, first serve basis. °  ° °Medicaid-accepting [place] Providers: ° °Organization         Address  Phone   Notes  °[facility] [address], [place] (336) 641-2100 Also accepts self-pay patients.  °[facility] [address], [place] ° (336) 856-9996   °[facility] [address], [place] (336) 288-8857   °[facility] [address] [place] [address], [place] (336) 299-7000   °Veita Bland [address], [place]  ° (336) 373-1557 Only accepts [place] Access Medicaid patients after they have their name applied to their card.  ° °Self-Pay (no insurance) in [place]: ° °Organization         Address  Phone   Notes  °Sickle Cell Patients, [facility] [address], [place] (336) 832-1970   °[facility] [address], [place] (336) 832-4400   ° [facility] ° [address], [place] (336) 992-4800   °[facility]/Dr. Osei-Bonsu ° [address] [place] [address], [place] or [address], [place] (336) 841-8500 Phone number for both [place] and [place] locations  is the same.  °[facility] [address], [place] (336) 299-0000   °[facility] [address] [place] [address],  or [address] (336) 852-7530 °(336) 878-2260   °[facility] [address],  (336) 350-1642, phone; (336) 294-5005, fax Sees patients 1st and 3rd Saturday of every month.  Must not qualify for public or private insurance (i.e. Medicaid, Medicare, [place] Health Choice, Veterans' Benefits) • Household income should be no more than 200% of the poverty level •The clinic cannot treat you if you are pregnant or think you are pregnant • Sexually transmitted diseases are not treated at the clinic.  ° ° °Dental Care: °Organization         Address  Phone  Notes  °[facility] [facility] [address],   [place] (336) 641-6152 Accepts children up to age 21 who are enrolled in Medicaid or [place] Health Choice; pregnant women with a Medicaid card; and children who have applied for Medicaid or [place] Health Choice, but were declined, whose parents can pay a reduced fee at time of service.  °[facility] [place]  [address], [place] (336) 641-7733 Accepts children up to age 21 who are enrolled in Medicaid or [place] Health Choice; pregnant women with a Medicaid card; and children who have applied for Medicaid or [place] Health Choice, but were declined, whose parents can pay a reduced fee at time of service.  °[facility] ° [address], [place] (336) 641-4533 Patients are seen by appointment only. Walk-ins are not accepted. [facility] will see patients 18 years of age and older. °Monday - Tuesday (8am-5pm) °Most Wednesdays (8:30-5pm) °$30 per visit, cash only  °[facility] ° [address], [place] (336) 641-4533 Patients are seen by appointment only. Walk-ins are not accepted. [facility] will see  patients 18 years of age and older. °One Wednesday Evening (Monthly: Volunteer Based).  $30 per visit, cash only  °[facility]  (919) 537-3737 for adults; Children under age 4, call Graduate Pediatric Dentistry at (919) [phone]. Children aged 4-14, please call (919) [phone] to request a pediatric application. ° Dental services are provided in all areas of dental care including fillings, crowns and bridges, complete and partial dentures, implants, gum treatment, root canals, and extractions. Preventive care is also provided. Treatment is provided to both adults and children. °Patients are selected via a lottery and there is often a waiting list. °  °[facility] [address], °[place] ° (336) 763-8833 [email or web address] °  °[facility] [address], [place], [place] (336)723-1848, Ext. 123 Second and Fourth Thursday of each month, opens at 6:30 AM; Clinic ends at 9 AM.  Patients are seen on a first-come first-served basis, and a limited number are seen during each clinic.  ° °[facility] ° [address], [place], [place] (336) 723-7904   Eligibility Requirements °You must have lived in [place], [place], or [place] counties for at least the last three months. °  You cannot be eligible for state or federal sponsored healthcare insurance, including Veterans Administration, Medicaid, or Medicare. °  You generally cannot be eligible for healthcare insurance through your employer.  °  How to apply: °Eligibility screenings are held every Tuesday and Wednesday afternoon from 1:00 pm until 4:00 pm. You do not need an appointment for the interview!  °[facility] [address], [place], [place] 336-631-2330   °[place] [facility]  336-342-8273   °[facility]  336-703-3100   °[facility]  336-570-6415   ° °Behavioral Health Resources in the Community: °Intensive Outpatient  Programs °Organization         Address  Phone  Notes  °[facility] [address], [place], [place] 336-878-6098   °[facility] [address], [place], [place] 336-832-9800   °[facility] [address], [place], [place] ° 336-882-2125   °[facility] [address],  °[place],  1-800-853-5163 or 336-641-4981   °Substance Abuse Resources °Organization         Address  Phone  Notes  °[facility]    336-882-2125   °[facility]  336-784-9470   °[facility]  336-285-9073   °[facility]  336-845-3988   °Residential & Outpatient Substance Abuse Program  1-800-659-3381   °Psychological Services °Organization         Address  Phone  Notes  °[facility]  336- 832-9600   °[facility]  336- 378-7881   °[facility] [address], [place] 1-800-853-5163 or 336-641-4981   ° °Mobile Crisis Teams °Organization         Address  Phone  Notes  °[facility], Mobile Crisis Care Unit  1-877-626-1772   °Assertive °[facility] ° [address] [place], [place] 336-834-9664   °Sharon DeEsch [address] °[place] 336-554-5454   ° °Self-Help/Support Groups °Organization         Address  Phone             Notes  °[facility] [place] - variety of support groups  336- 373-1402 Call for more information  °Narcotics Anonymous (NA), [facility] [address], °[place]  2 meetings at this location  ° °Residential Treatment Programs °Organization         Address  Phone  Notes  °[facility] [address],    °[place]  1-866-801-8205   °[facility] ° [address], [place], [place] 704-293-8524   °[facility] [address], [place] 336-845-3988 Admissions: 8am-3pm M-F  °[facility] [address]    °[place], [place]  336-841-1104   °[facility] [address], [place], [place] 336-379-7146   °[facility] [address]  °[place], [place] 336-285-9073   °[facility] - Intensive Outpatient [address], [place], [place] 336-852-3033   °[facility] [address]  °[place], [place] 1-877-615-2722 or 336-784-9470   °[facility] ([facility] [address] [place], [place] 336-227-7417 Accepts Medicaid  °[facility] [address]  °[place] 1-800-659-3381 Substance Abuse/Addiction Treatment  ° °[place] Behavioral Health Resources °Organization         Address  Phone  Notes  °[facility]  (888) 581-9988   °Julie Brannon, PhD [address] [place], [place]   (336) 349-5553 or (336) 951-0000   °[facility]   [address] °[place], [place] (336) 349-4454   °[facility] [address], [place], [place] (336) 342-8316 Insurance/Medicaid/sponsorship through [facility]  °[facility] [address]                                    [place], [place] (336) 342-8316 Therapy/tele-psych/case  °[facility] [address]  ° [place], [place] (336) 349-2233    °Dr. Arfeen  (336) 349-4544   °[facility] [place]  [employer] [place] [facility] 1) [address], [place] °2) [address], [place] °3)  [address], [place] (336) 349-3220 °(336) 342-7768 ° °(336) 342-8140   °[place] [facility] (336) 342-1394 or (336) 342-3537 (After Hours)    ° ° ° °

## 2015-02-22 NOTE — ED Notes (Signed)
Patient is complaining of shortness of breath and chest pain. Patient states something is wrong with him because he cant sleep.
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
## 2015-02-22 NOTE — ED Notes (Signed)
Pt transported to Xray. 

## 2015-02-22 NOTE — ED Notes (Signed)
Up to the bathroom 

## 2015-02-22 NOTE — Progress Notes (Signed)
Met patient sleeping at the time of shift change. Continues to sleep at this time. Will continue to monitor patient for safety and stability.

## 2015-02-22 NOTE — ED Notes (Addendum)
Pt ambulatory w/o difficulty to [facility] obs unit.  Belongings given to driver.  [facility] notified of pt's transport
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
## 2015-02-22 NOTE — ED Notes (Addendum)
Dustin Norris is here to transport.  PT sleeping soundly.

## 2015-02-22 NOTE — ED Notes (Signed)
Patient and belongings wanded 

## 2015-02-22 NOTE — ED Notes (Signed)
Delay in lab draw,  Pt enroute to exray 

## 2015-02-22 NOTE — ED Notes (Signed)
[facility] contacted for transport 

## 2015-02-23 DIAGNOSIS — F22 Delusional disorders: Secondary | ICD-10-CM | POA: Diagnosis not present

## 2015-02-23 MED ORDER — ALBUTEROL SULFATE HFA 108 (90 BASE) MCG/ACT IN AERS: 1.0000 | INHALATION_SPRAY | Freq: Four times a day (QID) | RESPIRATORY_TRACT | Status: DC | PRN

## 2015-02-23 MED ORDER — TRAZODONE HCL 50 MG PO TABS: 50.0000 mg | ORAL_TABLET | Freq: Every evening | ORAL | Status: DC | PRN

## 2015-02-23 MED ORDER — OLANZAPINE 5 MG PO TBDP: 5.0000 mg | ORAL_TABLET | Freq: Every day | ORAL | Status: DC
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
## 2015-02-23 NOTE — Progress Notes (Signed)
Patient slept all night. Staff woke patient up this morning @ 6 am. Patient smiling stated "I had a good night sleep. I was very tired". Patient denied pain, SI, AH/VH at this time. Patient verbalize no concerns and No complaint. Vital signs within normal limit (see chart). Will continue to monitor and observe patient for safety and stability.

## 2015-02-23 NOTE — Progress Notes (Signed)
CSW met with pt to discuss aftercare. Pt instructed to go to [facility] for outpatient mental health services/medication management and hospital follow-up. Pt given bus pass.Pt denies SI/Hi/AVH and is pleasant and calm during interaction.   Maxie Better, MSW, LCSW Clinical Social Worker 02/23/2015 12:36 PM
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
## 2015-02-23 NOTE — Progress Notes (Signed)
Nursing Discharge Note:  Patient has received all belongings and signed for them as well as signing the Discharge Summary and also given a copy of same. Patient also given prescriptions and bus pass. Patient continues to deny any SI/HI/AVH and states understanding of followup appointments and medications prescribed. Nurse escorting patient to [place] where he is walking out to meet the bus.

## 2015-02-26 ENCOUNTER — Encounter (HOSPITAL_COMMUNITY): Payer: Self-pay | Admitting: Emergency Medicine

## 2015-02-26 ENCOUNTER — Emergency Department (HOSPITAL_COMMUNITY)
Admission: EM | Admit: 2015-02-26 | Discharge: 2015-02-27 | Disposition: A | Discharge status: Home / Self Care | Payer: Self-pay | Attending: Emergency Medicine | Admitting: Emergency Medicine

## 2015-02-26 DIAGNOSIS — Z79899 Other long term (current) drug therapy: Secondary | ICD-10-CM | POA: Insufficient documentation

## 2015-02-26 DIAGNOSIS — J45909 Unspecified asthma, uncomplicated: Secondary | ICD-10-CM | POA: Insufficient documentation

## 2015-02-26 DIAGNOSIS — G43009 Migraine without aura, not intractable, without status migrainosus: Secondary | ICD-10-CM | POA: Insufficient documentation

## 2015-02-26 DIAGNOSIS — F17210 Nicotine dependence, cigarettes, uncomplicated: Secondary | ICD-10-CM | POA: Insufficient documentation

## 2015-02-26 DIAGNOSIS — Z590 Homelessness: Secondary | ICD-10-CM | POA: Insufficient documentation

## 2015-02-26 DIAGNOSIS — F419 Anxiety disorder, unspecified: Secondary | ICD-10-CM | POA: Insufficient documentation

## 2015-02-26 NOTE — ED Notes (Signed)
Pt states that he has had headaches x 2 days. WAnts his eyes checked. Alert and oriented. Neuro intact.

## 2015-02-27 ENCOUNTER — Emergency Department (HOSPITAL_COMMUNITY)
Admission: EM | Admit: 2015-02-27 | Discharge: 2015-02-28 | Disposition: A | Discharge status: Home / Self Care | Payer: Self-pay | Attending: Emergency Medicine | Admitting: Emergency Medicine

## 2015-02-27 ENCOUNTER — Encounter (HOSPITAL_COMMUNITY): Payer: Self-pay | Admitting: Emergency Medicine

## 2015-02-27 DIAGNOSIS — R519 Headache, unspecified: Secondary | ICD-10-CM

## 2015-02-27 DIAGNOSIS — J45909 Unspecified asthma, uncomplicated: Secondary | ICD-10-CM | POA: Insufficient documentation

## 2015-02-27 DIAGNOSIS — F17210 Nicotine dependence, cigarettes, uncomplicated: Secondary | ICD-10-CM | POA: Insufficient documentation

## 2015-02-27 DIAGNOSIS — R51 Headache: Secondary | ICD-10-CM | POA: Insufficient documentation

## 2015-02-27 DIAGNOSIS — Z79899 Other long term (current) drug therapy: Secondary | ICD-10-CM | POA: Insufficient documentation

## 2015-02-27 DIAGNOSIS — Z590 Homelessness: Secondary | ICD-10-CM | POA: Insufficient documentation

## 2015-02-27 DIAGNOSIS — Z8679 Personal history of other diseases of the circulatory system: Secondary | ICD-10-CM | POA: Insufficient documentation

## 2015-02-27 DIAGNOSIS — F419 Anxiety disorder, unspecified: Secondary | ICD-10-CM | POA: Insufficient documentation

## 2015-02-27 MED ORDER — METOCLOPRAMIDE HCL 10 MG PO TABS
10.0000 mg | ORAL_TABLET | Freq: Once | ORAL | Status: AC
  Administered 2015-02-27: 10 mg via ORAL
  Filled 2015-02-27: qty 1

## 2015-02-27 MED ORDER — NAPROXEN 500 MG PO TABS
500.0000 mg | ORAL_TABLET | Freq: Once | ORAL | Status: AC
  Administered 2015-02-27: 500 mg via ORAL
  Filled 2015-02-27: qty 1

## 2015-02-27 MED ORDER — METOCLOPRAMIDE HCL 5 MG/ML IJ SOLN
10.0000 mg | Freq: Once | INTRAMUSCULAR | Status: DC
  Filled 2015-02-27: qty 2

## 2015-02-27 MED ORDER — KETOROLAC TROMETHAMINE 60 MG/2ML IM SOLN
60.0000 mg | Freq: Once | INTRAMUSCULAR | Status: DC
  Filled 2015-02-27: qty 2

## 2015-02-27 MED ORDER — DIPHENHYDRAMINE HCL 25 MG PO CAPS
25.0000 mg | ORAL_CAPSULE | Freq: Once | ORAL | Status: AC
  Administered 2015-02-28: 25 mg via ORAL
  Filled 2015-02-27: qty 1

## 2015-02-27 NOTE — Discharge Instructions (Signed)
Recurrent Migraine Headache A migraine headache is an intense, throbbing pain on one or both sides of your head. Recurrent migraines keep coming back. A migraine can last for 30 minutes to several hours. CAUSES  The exact cause of a migraine headache is not always known. However, a migraine may be caused when nerves in the brain become irritated and release chemicals that cause inflammation. This causes pain. Certain things may also trigger migraines, such as:   Alcohol.  Smoking.  Stress.  Menstruation.  Aged cheeses.  Foods or drinks that contain nitrates, glutamate, aspartame, or tyramine.  Lack of sleep.  Chocolate.  Caffeine.  Hunger.  Physical exertion.  Fatigue.  Medicines used to treat chest pain (nitroglycerine), birth control pills, estrogen, and some blood pressure medicines. SYMPTOMS   Pain on one or both sides of your head.  Pulsating or throbbing pain.  Severe pain that prevents daily activities.  Pain that is aggravated by any physical activity.  Nausea, vomiting, or both.  Dizziness.  Pain with exposure to bright lights, loud noises, or activity.  General sensitivity to bright lights, loud noises, or smells. Before you get a migraine, you may get warning signs that a migraine is coming (aura). An aura may include:  Seeing flashing lights.  Seeing bright spots, halos, or zigzag lines.  Having tunnel vision or blurred vision.  Having feelings of numbness or tingling.  Having trouble talking.  Having muscle weakness. DIAGNOSIS  A recurrent migraine headache is often diagnosed based on:  Symptoms.  Physical examination.  A CT scan or MRI of your head. These imaging tests cannot diagnose migraines but can help rule out other causes of headaches.  TREATMENT  Medicines may be given for pain and nausea. Medicines can also be given to help prevent recurrent migraines. HOME CARE INSTRUCTIONS  Only take over-the-counter or prescription  medicines for pain or discomfort as directed by your health care provider. The use of long-term narcotics is not recommended.  Lie down in a dark, quiet room when you have a migraine.  Keep a journal to find out what may trigger your migraine headaches. For example, write down:  What you eat and drink.  How much sleep you get.  Any change to your diet or medicines.  Limit alcohol consumption.  Quit smoking if you smoke.  Get 7-9 hours of sleep, or as recommended by your health care provider.  Limit stress.  Keep lights dim if bright lights bother you and make your migraines worse. SEEK MEDICAL CARE IF:   You do not get relief from the medicines given to you.  You have a recurrence of pain.  You have a fever. SEEK IMMEDIATE MEDICAL CARE IF:  Your migraine becomes severe.  You have a stiff neck.  You have loss of vision.  You have muscular weakness or loss of muscle control.  You start losing your balance or have trouble walking.  You feel faint or pass out.  You have severe symptoms that are different from your first symptoms. MAKE SURE YOU:   Understand these instructions.  Will watch your condition.  Will get help right away if you are not doing well or get worse.   This information is not intended to replace advice given to you by your health care provider. Make sure you discuss any questions you have with your health care provider.   Document Released: 10/13/2000 Document Revised: 02/08/2014 Document Reviewed: 09/25/2012 Elsevier Interactive Patient Education 2016 [employer] Inc[employer]  

## 2015-02-27 NOTE — ED Provider Notes (Signed)
CSN: [id number]     Arrival date & time 02/27/15  2224 History   First MD Initiated Contact with Patient 02/27/15 2327     Chief Complaint  Patient presents with  . Headache     (Consider location/radiation/quality/duration/timing/severity/associated sxs/prior Treatment) Patient is a 40 y.o. male presenting with headaches. The history is provided by the patient. No language interpreter was used.  Headache Pain location:  Frontal Onset quality:  Gradual Timing:  Constant Chronicity:  Recurrent Associated symptoms: no abdominal pain, no congestion, no fever, no nausea, no neck pain, no neck stiffness, no photophobia, no sinus pressure, no sore throat and no weakness   Associated symptoms comment:  Patient returns to the emergency department with complaint of "migraine". He has been taking Tylenol without relief. No nausea, vomiting, congestion, sinus pressure, visual changes, fever. See in the emergency department for same on 02/26/15 and reports it won't go away. He reports it is similar to previous headaches.    Past Medical History  Diagnosis Date  . Anxiety   . Asthma   . Homelessness   . Migraine    History reviewed. No pertinent past surgical history. History reviewed. No pertinent family history. Social History  Substance Use Topics  . Smoking status: Current Every Day Smoker -- 2.00 packs/day for 5 years    Types: Cigarettes  . Smokeless tobacco: None  . Alcohol Use: No    Review of Systems  Constitutional: Negative for fever and chills.  HENT: Negative for congestion, sinus pressure and sore throat.   Eyes: Negative for photophobia and visual disturbance.  Respiratory: Negative.  Negative for shortness of breath.   Cardiovascular: Negative.  Negative for chest pain.  Gastrointestinal: Negative.  Negative for nausea and abdominal pain.  Musculoskeletal: Negative.  Negative for neck pain and neck stiffness.  Skin: Negative.   Neurological: Positive for headaches.  Negative for speech difficulty and weakness.      Allergies  Review of patient's allergies indicates no known allergies.  Home Medications   Prior to Admission medications   Medication Sig Start Date End Date Taking? Authorizing Provider  acetaminophen (TYLENOL) 500 MG tablet Take 1,000 mg by mouth every 6 (six) hours as needed.   Yes Historical Provider, MD  albuterol (PROVENTIL HFA;VENTOLIN HFA) 108 (90 Base) MCG/ACT inhaler Inhale 1 puff into the lungs every 6 (six) hours as needed for wheezing or shortness of breath. 02/23/15   Beau Fanny, FNP  OLANZapine zydis (ZYPREXA) 5 MG disintegrating tablet Take 1 tablet (5 mg total) by mouth at bedtime. 02/23/15   Beau Fanny, FNP  traZODone (DESYREL) 50 MG tablet Take 1 tablet (50 mg total) by mouth at bedtime as needed for sleep. 02/23/15   Everardo All Withrow, FNP   BP 126/81 mmHg  Pulse 86  Temp(Src) 98.2 F (36.8 C) (Oral)  Resp 18  SpO2 97% Physical Exam  Constitutional: He is oriented to person, place, and time. He appears well-developed and well-nourished.  HENT:  Head: Normocephalic and atraumatic.  Right Ear: Tympanic membrane normal.  Left Ear: Tympanic membrane normal.  Nose: No mucosal edema. Right sinus exhibits no frontal sinus tenderness. Left sinus exhibits no frontal sinus tenderness.  Mouth/Throat: Oropharynx is clear and moist.  Eyes: EOM are normal. Pupils are equal, round, and reactive to light.  Neck: Normal range of motion.  Cardiovascular: Normal rate and regular rhythm.   No murmur heard. Pulmonary/Chest: Effort normal and breath sounds normal. He has no wheezes. He has no  rales.  Abdominal: Soft. There is no tenderness.  Neurological: He is alert and oriented to person, place, and time. He has normal strength and normal reflexes. No sensory deficit. He displays a negative Romberg sign.  CN's 3--12 grossly intact. No deficits of coordination. Ambulatory without imbalance. Speech clear and focused. No deficits  of cognition or memory.  Skin: Skin is warm and dry.    ED Course  Procedures (including critical care time) Labs Review Labs Reviewed - No data to display  Imaging Review No results found. I have personally reviewed and evaluated these images and lab results as part of my medical decision-making.   EKG Interpretation None      MDM   Final diagnoses:  None    1. Migraine headache  No neurologic deficits on exam. The patient has a documented history of headaches. Discussed primary care follow up for recurrent headaches. Will provide headache medications and discharge home.   Patient declined medications for headache in the emergency room. He is discharged home with Rx ibuprofen. Encouraged to establish with primary care provider.   Elpidio Anis, PA-C 02/28/15 1191  Dione Booze, MD 02/28/15 [phone]

## 2015-02-27 NOTE — ED Notes (Signed)
Pt was seen yesterday for same sxs. Pt c/o HA x 2 days.

## 2015-02-27 NOTE — ED Provider Notes (Signed)
CSN: [id number]     Arrival date & time 02/26/15  2243 History  By signing my name below, I, Dustin Norris, attest that this documentation has been prepared under the direction and in the presence of Jannine Abreu, MD . Electronically Signed: Freida Norris, Scribe. 02/27/2015. 2:14 AM.  Chief Complaint  Patient presents with  . Headache   Patient is a 40 y.o. male presenting with headaches. The history is provided by the patient. No language interpreter was used.  Headache Pain location:  Frontal Radiates to:  Does not radiate Severity currently:  8/10 Severity at highest:  8/10 Onset quality:  Gradual Timing:  Constant Progression:  Unchanged Chronicity:  Recurrent Similar to prior headaches: yes   Context: not activity, not intercourse and not loud noise   Relieved by:  Nothing Worsened by:  Nothing Ineffective treatments:  None tried Associated symptoms: no blurred vision, no dizziness, no eye pain, no facial pain, no fever, no focal weakness, no loss of balance, no myalgias, no near-syncope, no neck pain, no neck stiffness, no photophobia, no seizures, no sinus pressure, no syncope, no tingling, no visual change and no vomiting   Risk factors: no anger      HPI Comments:  Dustin Norris is a 40 y.o. male with a history of migraine HA, who presents to the Emergency Department complaining of a frontal HA x 2 days with 8/10 pain. Pain today is similar to past migraines. He notes recent increase in stress. No alleviating factors noted.   Past Medical History  Diagnosis Date  . Anxiety   . Asthma   . Homelessness   . Migraine    History reviewed. No pertinent past surgical history. History reviewed. No pertinent family history. Social History  Substance Use Topics  . Smoking status: Current Every Day Smoker -- 2.00 packs/day for 5 years    Types: Cigarettes  . Smokeless tobacco: None  . Alcohol Use: No    Review of Systems  Constitutional: Negative for fever.  HENT:  Negative for sinus pressure.   Eyes: Negative for blurred vision, photophobia, pain and visual disturbance.  Cardiovascular: Negative for syncope and near-syncope.  Gastrointestinal: Negative for vomiting.  Musculoskeletal: Negative for myalgias, neck pain and neck stiffness.  Neurological: Positive for headaches. Negative for dizziness, tremors, focal weakness, seizures, syncope, facial asymmetry, speech difficulty, light-headedness and loss of balance.  All other systems reviewed and are negative.   Allergies  Review of patient's allergies indicates no known allergies.  Home Medications   Prior to Admission medications   Medication Sig Start Date End Date Taking? Authorizing Provider  albuterol (PROVENTIL HFA;VENTOLIN HFA) 108 (90 Base) MCG/ACT inhaler Inhale 1 puff into the lungs every 6 (six) hours as needed for wheezing or shortness of breath. 02/23/15   Beau Fanny, FNP  OLANZapine zydis (ZYPREXA) 5 MG disintegrating tablet Take 1 tablet (5 mg total) by mouth at bedtime. 02/23/15   Beau Fanny, FNP  traZODone (DESYREL) 50 MG tablet Take 1 tablet (50 mg total) by mouth at bedtime as needed for sleep. 02/23/15   Everardo All Withrow, FNP   BP 110/69 mmHg  Pulse 77  Temp(Src) 98.1 F (36.7 C) (Oral)  Resp 16  SpO2 100% Physical Exam  Constitutional: He is oriented to person, place, and time. He appears well-developed and well-nourished. No distress.  HENT:  Head: Normocephalic and atraumatic.  Right Ear: Tympanic membrane normal.  Left Ear: Tympanic membrane normal.  Mouth/Throat: Oropharynx is clear and moist. No  oropharyngeal exudate.  Moist mucous membranes   Eyes: Conjunctivae and EOM are normal.  Pinpoint pupils bilaterally  Neck: Normal range of motion. Neck supple. No JVD present.  Trachea midline  Cardiovascular: Normal rate, regular rhythm and normal heart sounds.   Pulmonary/Chest: Effort normal and breath sounds normal. No respiratory distress. He has no wheezes. He  has no rales.  Abdominal: Soft. Bowel sounds are normal. He exhibits no distension. There is no tenderness. There is no rebound and no guarding.  Musculoskeletal: Normal range of motion.  Neurological: He is alert and oriented to person, place, and time. He has normal reflexes. He displays normal reflexes. No cranial nerve deficit. He exhibits normal muscle tone. Coordination normal.  Sensation intact 5/5 BUE strength   Skin: Skin is warm and dry.  Psychiatric: He has a normal mood and affect. His behavior is normal.  Nursing note and vitals reviewed.   ED Course  Procedures   DIAGNOSTIC STUDIES:  Oxygen Saturation is 100% on RA, normal by my interpretation.    COORDINATION OF CARE:  2:12 AM Will discharge with pain meds.  Discussed treatment plan with pt at bedside and pt agreed to plan.   MDM   Final diagnoses:  None    Frequent headaches there is nothing atypical about this one.  Resting comfortably.  No indication for imaging at this time.  Stable for d/c strict return precautions given  I personally performed the services described in this documentation, which was scribed in my presence. The recorded information has been reviewed and is accurate.      Cy Blamer, MD 02/27/15 [phone]

## 2015-02-28 ENCOUNTER — Emergency Department (HOSPITAL_COMMUNITY)
Admission: EM | Admit: 2015-02-28 | Discharge: 2015-02-28 | Discharge status: Against Medical Advice | Payer: Self-pay | Attending: Emergency Medicine | Admitting: Emergency Medicine

## 2015-02-28 ENCOUNTER — Encounter (HOSPITAL_COMMUNITY): Payer: Self-pay | Admitting: Emergency Medicine

## 2015-02-28 DIAGNOSIS — F419 Anxiety disorder, unspecified: Secondary | ICD-10-CM | POA: Insufficient documentation

## 2015-02-28 DIAGNOSIS — Z79899 Other long term (current) drug therapy: Secondary | ICD-10-CM | POA: Insufficient documentation

## 2015-02-28 DIAGNOSIS — Z590 Homelessness: Secondary | ICD-10-CM | POA: Insufficient documentation

## 2015-02-28 DIAGNOSIS — J45909 Unspecified asthma, uncomplicated: Secondary | ICD-10-CM | POA: Insufficient documentation

## 2015-02-28 DIAGNOSIS — R1030 Lower abdominal pain, unspecified: Secondary | ICD-10-CM | POA: Insufficient documentation

## 2015-02-28 DIAGNOSIS — F17210 Nicotine dependence, cigarettes, uncomplicated: Secondary | ICD-10-CM | POA: Insufficient documentation

## 2015-02-28 DIAGNOSIS — Z8679 Personal history of other diseases of the circulatory system: Secondary | ICD-10-CM | POA: Insufficient documentation

## 2015-02-28 DIAGNOSIS — R197 Diarrhea, unspecified: Secondary | ICD-10-CM | POA: Insufficient documentation

## 2015-02-28 MED ORDER — IBUPROFEN 800 MG PO TABS: 800.0000 mg | ORAL_TABLET | Freq: Three times a day (TID) | ORAL | Status: DC

## 2015-02-28 MED ORDER — ACETAMINOPHEN 325 MG PO TABS
650.0000 mg | ORAL_TABLET | Freq: Once | ORAL | Status: AC
  Administered 2015-02-28: 650 mg via ORAL
  Filled 2015-02-28: qty 2

## 2015-02-28 MED ORDER — SODIUM CHLORIDE 0.9 % IV BOLUS (SEPSIS): 1000.0000 mL | Freq: Once | INTRAVENOUS | Status: DC

## 2015-02-28 MED ORDER — KETOROLAC TROMETHAMINE 30 MG/ML IJ SOLN
30.0000 mg | Freq: Once | INTRAMUSCULAR | Status: DC
  Filled 2015-02-28: qty 1

## 2015-02-28 NOTE — ED Notes (Signed)
Pt states he is having abd pain with diarrhea today  Pt denies vomiting
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
## 2015-02-28 NOTE — Discharge Instructions (Signed)

## 2015-02-28 NOTE — ED Notes (Signed)
Pt aware urine sample is needed, cup provided 
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
## 2015-02-28 NOTE — ED Notes (Signed)
PT REFUSING LAB WORK. NURSE AWARE.

## 2015-03-04 ENCOUNTER — Emergency Department (HOSPITAL_COMMUNITY)
Admission: EM | Admit: 2015-03-04 | Discharge: 2015-03-04 | Disposition: A | Discharge status: Home / Self Care | Payer: Self-pay | Attending: Emergency Medicine | Admitting: Emergency Medicine

## 2015-03-04 ENCOUNTER — Encounter (HOSPITAL_COMMUNITY): Payer: Self-pay | Admitting: Emergency Medicine

## 2015-03-04 DIAGNOSIS — Z590 Homelessness: Secondary | ICD-10-CM | POA: Insufficient documentation

## 2015-03-04 DIAGNOSIS — B353 Tinea pedis: Secondary | ICD-10-CM | POA: Insufficient documentation

## 2015-03-04 DIAGNOSIS — F419 Anxiety disorder, unspecified: Secondary | ICD-10-CM | POA: Insufficient documentation

## 2015-03-04 DIAGNOSIS — Z8679 Personal history of other diseases of the circulatory system: Secondary | ICD-10-CM | POA: Insufficient documentation

## 2015-03-04 DIAGNOSIS — J45909 Unspecified asthma, uncomplicated: Secondary | ICD-10-CM | POA: Insufficient documentation

## 2015-03-04 DIAGNOSIS — Z79899 Other long term (current) drug therapy: Secondary | ICD-10-CM | POA: Insufficient documentation

## 2015-03-04 DIAGNOSIS — F17210 Nicotine dependence, cigarettes, uncomplicated: Secondary | ICD-10-CM | POA: Insufficient documentation

## 2015-03-04 DIAGNOSIS — Z791 Long term (current) use of non-steroidal anti-inflammatories (NSAID): Secondary | ICD-10-CM | POA: Insufficient documentation

## 2015-03-04 MED ORDER — CEPHALEXIN 500 MG PO CAPS: 500.0000 mg | ORAL_CAPSULE | Freq: Four times a day (QID) | ORAL | Status: DC

## 2015-03-04 MED ORDER — TERBINAFINE HCL 1 % EX CREA
TOPICAL_CREAM | Freq: Two times a day (BID) | CUTANEOUS | Status: DC
  Administered 2015-03-04: 20:00:00 via TOPICAL
  Filled 2015-03-04: qty 12

## 2015-03-04 NOTE — ED Provider Notes (Signed)
CSN: [id number]     Arrival date & time 03/04/15  1758 History  By signing my name below, I, Dustin Norris, attest that this documentation has been prepared under the direction and in the presence of non-physician practitioner, Arthor Captain, PA-C. Electronically Signed: Marisue Norris, Scribe. 03/04/2015. 7:49 PM.   Chief Complaint  Patient presents with  . Rash   The history is provided by the patient. No language interpreter was used.   HPI Comments:  Dustin Norris is a 40 y.o. male who presents to the Emergency Department complaining of "wet" rash to both feet. He reports associated mild pain in all his toes. Pt states he changes his socks regularly. No alleviating factors noted. Pt is currently homeless and has been seen in ED 4 times in past ten days. He notes he has been treated for similar symptoms previously. Pt is a current 2 pack/day smoker.  Past Medical History  Diagnosis Date  . Anxiety   . Asthma   . Homelessness   . Migraine    History reviewed. No pertinent past surgical history. History reviewed. No pertinent family history. Social History  Substance Use Topics  . Smoking status: Current Every Day Smoker -- 2.00 packs/day for 5 years    Types: Cigarettes  . Smokeless tobacco: None  . Alcohol Use: No    Review of Systems  Musculoskeletal: Positive for arthralgias (BLE).  Skin: Positive for rash (BLE).  All other systems reviewed and are negative.  Allergies  Review of patient's allergies indicates no known allergies.  Home Medications   Prior to Admission medications   Medication Sig Start Date End Date Taking? Authorizing Provider  acetaminophen (TYLENOL) 500 MG tablet Take 1,000 mg by mouth every 6 (six) hours as needed for moderate pain.     Historical Provider, MD  albuterol (PROVENTIL HFA;VENTOLIN HFA) 108 (90 Base) MCG/ACT inhaler Inhale 1 puff into the lungs every 6 (six) hours as needed for wheezing or shortness of breath. 02/23/15   Beau Fanny, FNP  cephALEXin (KEFLEX) 500 MG capsule Take 1 capsule (500 mg total) by mouth 4 (four) times daily. 03/04/15   Arthor Captain, PA-C  ibuprofen (ADVIL,MOTRIN) 800 MG tablet Take 1 tablet (800 mg total) by mouth 3 (three) times daily. 02/28/15   Shari Upstill, PA-C  OLANZapine zydis (ZYPREXA) 5 MG disintegrating tablet Take 1 tablet (5 mg total) by mouth at bedtime. 02/23/15   Beau Fanny, FNP  traZODone (DESYREL) 50 MG tablet Take 1 tablet (50 mg total) by mouth at bedtime as needed for sleep. 02/23/15   Everardo All Withrow, FNP   BP 124/73 mmHg  Pulse 80  Temp(Src) 98.6 F (37 C) (Oral)  Resp 17  SpO2 100% Physical Exam  Constitutional: Pt appears well-developed and well-nourished. No distress.  Awake, alert, nontoxic appearance  HENT:  Head: Normocephalic and atraumatic.  Mouth/Throat: Oropharynx is clear and moist. No oropharyngeal exudate.  Eyes: Conjunctivae are normal. No scleral icterus.  Neck: Normal range of motion. Neck supple.  Cardiovascular: Normal rate, regular rhythm and intact distal pulses.   Pulmonary/Chest: Effort normal and breath sounds normal. No respiratory distress. Pt has no wheezes.  Equal chest expansion  Abdominal: Soft. Bowel sounds are normal. Pt exhibits no mass. There is no tenderness. There is no rebound and no guarding.  Musculoskeletal: Normal range of motion. Pt exhibits no edema.  Neurological: Pt is alert.  Speech is clear and goal oriented Moves extremities without ataxia  Skin: Skin is warm  and dry. Pt is not diaphoretic. Macerated tinea pedis BLE.  Psychiatric: Pt has a normal mood and affect.  Nursing note and vitals reviewed.  ED Course  Procedures  DIAGNOSTIC STUDIES: Oxygen Saturation is 100% on RA, normal by my interpretation.    COORDINATION OF CARE: 7:26 PM Will prescribe antibiotic for possible secondary cellulitis. Discussed treatment plan with pt at bedside and pt agreed to plan.  Labs Review Labs Reviewed - No data to  display  Imaging Review No results found.   EKG Interpretation None      MDM   Final diagnoses:  Tinea pedis of both feet      pateitn with  Macerated tinea pedis, given lamisil here int the ed. May have some superimposed cellulitis and will treat with keflex. Appears safe for dc  I personally performed the services described in this documentation, which was scribed in my presence. The recorded information has been reviewed and is accurate.      Arthor Captain, PA-C 03/05/15 1533  Glynn Octave, MD 03/05/15 1537
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
## 2015-03-04 NOTE — Discharge Instructions (Signed)
Athlete's Foot °Athlete's foot (tinea pedis) is a fungal infection of the skin on the feet. It often occurs on the skin between the toes or underneath the toes. It can also occur on the soles of the feet. Athlete's foot is more likely to occur in hot, humid weather. Not washing your feet or changing your socks often enough can contribute to athlete's foot. The infection can spread from person to person (contagious). °CAUSES °Athlete's foot is caused by a fungus. This fungus thrives in warm, moist places. Most people get athlete's foot by sharing shower stalls, towels, and wet floors with an infected person. People with weakened immune systems, including those with diabetes, may be more likely to get athlete's foot. °SYMPTOMS  °· Itchy areas between the toes or on the soles of the feet. °· White, flaky, or scaly areas between the toes or on the soles of the feet. °· Tiny, intensely itchy blisters between the toes or on the soles of the feet. °· Tiny cuts on the skin. These cuts can develop a bacterial infection. °· Thick or discolored toenails. °DIAGNOSIS  °Your caregiver can usually tell what the problem is by doing a physical exam. Your caregiver may also take a skin sample from the rash area. The skin sample may be examined under a microscope, or it may be tested to see if fungus will grow in the sample. A sample may also be taken from your toenail for testing. °TREATMENT  °Over-the-counter and prescription medicines can be used to kill the fungus. These medicines are available as powders or creams. Your caregiver can suggest medicines for you. Fungal infections respond slowly to treatment. You may need to continue using your medicine for several weeks. °PREVENTION  °· Do not share towels. °· Wear sandals in wet areas, such as shared locker rooms and shared showers. °· Keep your feet dry. Wear shoes that allow air to circulate. Wear cotton or wool socks. °HOME CARE INSTRUCTIONS  °· Take medicines as directed by  your caregiver. Do not use steroid creams on athlete's foot. °· Keep your feet clean and cool. Wash your feet daily and dry them thoroughly, especially between your toes. °· Change your socks every day. Wear cotton or wool socks. In hot climates, you may need to change your socks 2 to 3 times per day. °· Wear sandals or canvas tennis shoes with good air circulation. °· If you have blisters, soak your feet in Burow's solution or Epsom salts for 20 to 30 minutes, 2 times a day to dry out the blisters. Make sure you dry your feet thoroughly afterward. °SEEK MEDICAL CARE IF:  °· You have a fever. °· You have swelling, soreness, warmth, or redness in your foot. °· You are not getting better after 7 days of treatment. °· You are not completely cured after 30 days. °· You have any problems caused by your medicines. °MAKE SURE YOU:  °· Understand these instructions. °· Will watch your condition. °· Will get help right away if you are not doing well or get worse. °  °This information is not intended to replace advice given to you by your health care provider. Make sure you discuss any questions you have with your health care provider. °  °Document Released: 01/16/2000 Document Revised: 04/12/2011 Document Reviewed: 07/22/2014 °Elsevier Interactive Patient Education ©2016 [employer] ° °Cellulitis °Cellulitis is an infection of the skin and the tissue beneath it. The infected area is usually red and tender. Cellulitis occurs most often in   the arms and lower legs.  °CAUSES  °Cellulitis is caused by bacteria that enter the skin through cracks or cuts in the skin. The most common types of bacteria that cause cellulitis are staphylococci and streptococci. °SIGNS AND SYMPTOMS  °· Redness and warmth. °· Swelling. °· Tenderness or pain. °· Fever. °DIAGNOSIS  °Your health care provider can usually determine what is wrong based on a physical exam. Blood tests may also be done. °TREATMENT  °Treatment usually involves taking an  antibiotic medicine. °HOME CARE INSTRUCTIONS  °· Take your antibiotic medicine as directed by your health care provider. Finish the antibiotic even if you start to feel better. °· Keep the infected arm or leg elevated to reduce swelling. °· Apply a warm cloth to the affected area up to 4 times per day to relieve pain. °· Take medicines only as directed by your health care provider. °· Keep all follow-up visits as directed by your health care provider. °SEEK MEDICAL CARE IF:  °· You notice red streaks coming from the infected area. °· Your red area gets larger or turns dark in color. °· Your bone or joint underneath the infected area becomes painful after the skin has healed. °· Your infection returns in the same area or another area. °· You notice a swollen bump in the infected area. °· You develop new symptoms. °· You have a fever. °SEEK IMMEDIATE MEDICAL CARE IF:  °· You feel very sleepy. °· You develop vomiting or diarrhea. °· You have a general ill feeling (malaise) with muscle aches and pains. °  °This information is not intended to replace advice given to you by your health care provider. Make sure you discuss any questions you have with your health care provider. °  °Document Released: 10/28/2004 Document Revised: 10/09/2014 Document Reviewed: 04/05/2011 °Elsevier Interactive Patient Education ©2016 [employer] Inc. ° °

## 2015-03-04 NOTE — ED Notes (Signed)
Pt c/o 10/10 bilateral foot pain which he states is athlete's foot rash, diagnosed previously for same.  No acute distress, ambulatory without difficulty.

## 2015-03-17 ENCOUNTER — Emergency Department (HOSPITAL_COMMUNITY)
Admission: EM | Admit: 2015-03-17 | Discharge: 2015-03-17 | Disposition: A | Discharge status: Home / Self Care | Payer: Self-pay | Attending: Emergency Medicine | Admitting: Emergency Medicine

## 2015-03-17 ENCOUNTER — Encounter (HOSPITAL_COMMUNITY): Payer: Self-pay | Admitting: *Deleted

## 2015-03-17 DIAGNOSIS — M79672 Pain in left foot: Secondary | ICD-10-CM | POA: Insufficient documentation

## 2015-03-17 DIAGNOSIS — J45909 Unspecified asthma, uncomplicated: Secondary | ICD-10-CM | POA: Insufficient documentation

## 2015-03-17 DIAGNOSIS — Z8679 Personal history of other diseases of the circulatory system: Secondary | ICD-10-CM | POA: Insufficient documentation

## 2015-03-17 DIAGNOSIS — F419 Anxiety disorder, unspecified: Secondary | ICD-10-CM | POA: Insufficient documentation

## 2015-03-17 DIAGNOSIS — Z590 Homelessness: Secondary | ICD-10-CM | POA: Insufficient documentation

## 2015-03-17 DIAGNOSIS — F17210 Nicotine dependence, cigarettes, uncomplicated: Secondary | ICD-10-CM | POA: Insufficient documentation

## 2015-03-17 MED ORDER — NYSTATIN 100000 UNIT/GM EX CREA: 1.0000 "application " | TOPICAL_CREAM | Freq: Four times a day (QID) | CUTANEOUS | Status: DC

## 2015-03-17 MED ORDER — CEPHALEXIN 500 MG PO CAPS: 500.0000 mg | ORAL_CAPSULE | Freq: Four times a day (QID) | ORAL | Status: DC

## 2015-03-17 NOTE — ED Provider Notes (Signed)
CSN: [id number]     Arrival date & time 03/17/15  0211 History   First MD Initiated Contact with Patient 03/17/15 [phone]     Chief Complaint  Patient presents with  . Foot Pain     (Consider location/radiation/quality/duration/timing/severity/associated sxs/prior Treatment) HPI Dustin Norris is a 40 y.o. male because in for evaluation of foot pain. Patient reports diffuse, throbbing pain in his left foot, gradually worsening over 1 week. Has not tried anything to improve his symptoms. Ambulation and weightbearing worsens his discomfort. No fevers, chills, numbness or weakness. Pain is moderate. Reports he is homeless. Has been evaluated for these symptoms in the past.  Past Medical History  Diagnosis Date  . Anxiety   . Asthma   . Homelessness   . Migraine    History reviewed. No pertinent past surgical history. No family history on file. Social History  Substance Use Topics  . Smoking status: Current Every Day Smoker -- 2.00 packs/day for 5 years    Types: Cigarettes  . Smokeless tobacco: None  . Alcohol Use: No    Review of Systems A 10 point review of systems was completed and was negative except for pertinent positives and negatives as mentioned in the history of present illness     Allergies  Review of patient's allergies indicates no known allergies.  Home Medications   Prior to Admission medications   Medication Sig Start Date End Date Taking? Authorizing Provider  acetaminophen (TYLENOL) 500 MG tablet Take 1,000 mg by mouth every 6 (six) hours as needed for moderate pain.     Historical Provider, MD  albuterol (PROVENTIL HFA;VENTOLIN HFA) 108 (90 Base) MCG/ACT inhaler Inhale 1 puff into the lungs every 6 (six) hours as needed for wheezing or shortness of breath. 02/23/15   Beau Fanny, FNP  cephALEXin (KEFLEX) 500 MG capsule Take 1 capsule (500 mg total) by mouth 4 (four) times daily. 03/17/15   Joycie Peek, PA-C  ibuprofen (ADVIL,MOTRIN) 800 MG tablet Take  1 tablet (800 mg total) by mouth 3 (three) times daily. 02/28/15   Elpidio Anis, PA-C  nystatin cream (MYCOSTATIN) Apply 1 application topically 4 (four) times daily. Apply to affected area every 4-6 hours x 10 days 03/17/15   Joycie Peek, PA-C  OLANZapine zydis (ZYPREXA) 5 MG disintegrating tablet Take 1 tablet (5 mg total) by mouth at bedtime. 02/23/15   Beau Fanny, FNP  traZODone (DESYREL) 50 MG tablet Take 1 tablet (50 mg total) by mouth at bedtime as needed for sleep. 02/23/15   Everardo All Withrow, FNP   BP 121/69 mmHg  Pulse 78  Temp(Src) 97.8 F (36.6 C) (Oral)  Resp 16  SpO2 99% Physical Exam  Constitutional:  Awake, alert, nontoxic appearance.  HENT:  Head: Atraumatic.  Eyes: Right eye exhibits no discharge. Left eye exhibits no discharge.  Neck: Neck supple.  Pulmonary/Chest: Effort normal. He exhibits no tenderness.  Abdominal: Soft. There is no tenderness. There is no rebound.  Musculoskeletal: He exhibits no tenderness.  Baseline ROM, no obvious new focal weakness.  Neurological:  Mental status and motor strength appears baseline for patient and situation.  Skin: No rash noted.  Bilateral feet with scaling, macerated rash consistent with tinea pedis. Mild tenderness to palpation over left dorsum of foot. No overt erythema, warmth, fluctuance. Distal pulses intact with brisk cap refill.  Psychiatric: He has a normal mood and affect.  Nursing note and vitals reviewed.   ED Course  Procedures (including critical care time) Labs  Review Labs Reviewed - No data to display  Imaging Review No results found. I have personally reviewed and evaluated these images and lab results as part of my medical decision-making.   EKG Interpretation None     Meds given in ED:  Medications - No data to display  New Prescriptions   NYSTATIN CREAM (MYCOSTATIN)    Apply 1 application topically 4 (four) times daily. Apply to affected area every 4-6 hours x 10 days   Filed Vitals:    03/17/15 0208 03/17/15 0729  BP: 126/83 121/69  Pulse: 85 78  Temp: 98.4 F (36.9 C) 97.8 F (36.6 C)  TempSrc: Oral Oral  Resp: 16 16  SpO2: 99% 99%    MDM  Dustin Norris is a 40 y.o. male patient here for evaluation of foot pain. Patient has been evaluated for this problem multiple times in the past. Reports he ran out of his antifungal cream and did not fill his antibiotic prescription stating "I've been too busy". Discussed he would need to fill these medications in order to treat his symptoms. He verbalizes understanding. Will re-prescribe Keflex for possible superficial cellulitis as well as nystatin powder. The patient appears reasonably screened and/or stabilized for discharge and I doubt any other medical condition or other [facility] requiring further screening, evaluation, or treatment in the ED at this time prior to discharge.   Final diagnoses:  Left foot pain      Joycie Peek, PA-C 03/17/15 7829  Arby Barrette, MD 03/19/15 1704

## 2015-03-17 NOTE — ED Notes (Signed)
The pt is c/o lt foot pain for one week  He thinks he has a fungus on it

## 2015-03-17 NOTE — Discharge Instructions (Signed)
Please take your medications as prescribed. Follow-up with your doctor or the [facility] in order to establish primary care. Return to ED for any new or worsening symptoms as discussed.

## 2015-03-17 NOTE — ED Notes (Signed)
Pt came by ambulance amb

## 2015-03-17 NOTE — ED Notes (Signed)
Care  Manager to se PT

## 2015-03-17 NOTE — ED Notes (Signed)
Declined W/C at D/C and was escorted to lobby by RN. 

## 2015-03-18 ENCOUNTER — Encounter (HOSPITAL_COMMUNITY): Payer: Self-pay | Admitting: *Deleted

## 2015-03-18 DIAGNOSIS — Z590 Homelessness: Secondary | ICD-10-CM | POA: Insufficient documentation

## 2015-03-18 DIAGNOSIS — G43809 Other migraine, not intractable, without status migrainosus: Secondary | ICD-10-CM | POA: Insufficient documentation

## 2015-03-18 DIAGNOSIS — Z8659 Personal history of other mental and behavioral disorders: Secondary | ICD-10-CM | POA: Insufficient documentation

## 2015-03-18 DIAGNOSIS — J45909 Unspecified asthma, uncomplicated: Secondary | ICD-10-CM | POA: Insufficient documentation

## 2015-03-18 DIAGNOSIS — F17210 Nicotine dependence, cigarettes, uncomplicated: Secondary | ICD-10-CM | POA: Insufficient documentation

## 2015-03-18 DIAGNOSIS — Z792 Long term (current) use of antibiotics: Secondary | ICD-10-CM | POA: Insufficient documentation

## 2015-03-18 MED ORDER — OXYCODONE-ACETAMINOPHEN 5-325 MG PO TABS
ORAL_TABLET | ORAL | Status: AC
  Filled 2015-03-18: qty 1

## 2015-03-18 MED ORDER — OXYCODONE-ACETAMINOPHEN 5-325 MG PO TABS
1.0000 | ORAL_TABLET | Freq: Once | ORAL | Status: AC
  Administered 2015-03-18: 1 via ORAL

## 2015-03-18 NOTE — ED Notes (Signed)
Pt reports headache yesterday, pt taking tylenol without relief. Pt reports hx of the same. Pt wants to have brain checked out.

## 2015-03-19 ENCOUNTER — Emergency Department (HOSPITAL_COMMUNITY)
Admission: EM | Admit: 2015-03-19 | Discharge: 2015-03-19 | Disposition: A | Discharge status: Home / Self Care | Payer: Self-pay | Attending: Emergency Medicine | Admitting: Emergency Medicine

## 2015-03-19 DIAGNOSIS — G43809 Other migraine, not intractable, without status migrainosus: Secondary | ICD-10-CM

## 2015-03-19 MED ORDER — PROCHLORPERAZINE MALEATE 10 MG PO TABS
10.0000 mg | ORAL_TABLET | Freq: Once | ORAL | Status: AC
  Administered 2015-03-19: 10 mg via ORAL
  Filled 2015-03-19: qty 1

## 2015-03-19 MED ORDER — PREDNISONE 20 MG PO TABS
60.0000 mg | ORAL_TABLET | Freq: Once | ORAL | Status: AC
  Administered 2015-03-19: 60 mg via ORAL
  Filled 2015-03-19: qty 3

## 2015-03-19 MED ORDER — IBUPROFEN 800 MG PO TABS
800.0000 mg | ORAL_TABLET | Freq: Once | ORAL | Status: AC
  Administered 2015-03-19: 800 mg via ORAL
  Filled 2015-03-19: qty 1

## 2015-03-19 MED ORDER — TRAMADOL HCL 50 MG PO TABS
100.0000 mg | ORAL_TABLET | Freq: Once | ORAL | Status: AC
  Administered 2015-03-19: 100 mg via ORAL
  Filled 2015-03-19: qty 2

## 2015-03-19 NOTE — ED Provider Notes (Signed)
CSN: [id number]     Arrival date & time 03/18/15  1949 History  By signing my name below, I, Freida Busman, attest that this documentation has been prepared under the direction and in the presence of Gilda Crease, MD . Electronically Signed: Freida Busman, Scribe. 03/19/2015. 1:16 AM.      Chief Complaint  Patient presents with  . Headache    The history is provided by the patient. No language interpreter was used.   HPI Comments:  Dustin Norris is a 40 y.o. male with a history of migraines, who presents to the Emergency Department complaining of frontal HA x  2 days with constant pain. His pain is exacerbated by light. Pt reports associated nausea. He notes his symptoms today are similar to past migraine HAs. He has been taking tylenol without relief. No weakness/numbness, dizziness or vomiting.   Past Medical History  Diagnosis Date  . Anxiety   . Asthma   . Homelessness   . Migraine    History reviewed. No pertinent past surgical history. History reviewed. No pertinent family history. Social History  Substance Use Topics  . Smoking status: Current Every Day Smoker -- 2.00 packs/day for 5 years    Types: Cigarettes  . Smokeless tobacco: None  . Alcohol Use: No    Review of Systems  Constitutional: Negative for fever and chills.  Eyes: Positive for photophobia.  Gastrointestinal: Positive for nausea. Negative for vomiting.  Neurological: Positive for headaches. Negative for dizziness, weakness, light-headedness and numbness.  All other systems reviewed and are negative.  Allergies  Review of patient's allergies indicates no known allergies.  Home Medications   Prior to Admission medications   Medication Sig Start Date End Date Taking? Authorizing Provider  cephALEXin (KEFLEX) 500 MG capsule Take 1 capsule (500 mg total) by mouth 4 (four) times daily. 03/17/15  Yes Benjamin Cartner, PA-C  albuterol (PROVENTIL HFA;VENTOLIN HFA) 108 (90 Base) MCG/ACT inhaler  Inhale 1 puff into the lungs every 6 (six) hours as needed for wheezing or shortness of breath. Patient not taking: Reported on 03/19/2015 02/23/15   Beau Fanny, FNP  ibuprofen (ADVIL,MOTRIN) 800 MG tablet Take 1 tablet (800 mg total) by mouth 3 (three) times daily. Patient not taking: Reported on 03/19/2015 02/28/15   Elpidio Anis, PA-C  nystatin cream (MYCOSTATIN) Apply 1 application topically 4 (four) times daily. Apply to affected area every 4-6 hours x 10 days Patient not taking: Reported on 03/19/2015 03/17/15   Joycie Peek, PA-C  OLANZapine zydis (ZYPREXA) 5 MG disintegrating tablet Take 1 tablet (5 mg total) by mouth at bedtime. Patient not taking: Reported on 03/19/2015 02/23/15   Beau Fanny, FNP  traZODone (DESYREL) 50 MG tablet Take 1 tablet (50 mg total) by mouth at bedtime as needed for sleep. Patient not taking: Reported on 03/19/2015 02/23/15   Beau Fanny, FNP   BP 113/64 mmHg  Pulse 55  Temp(Src) 98.5 F (36.9 C) (Oral)  Resp 20  Wt 156 lb (70.761 kg)  SpO2 99% Physical Exam  Constitutional: He is oriented to person, place, and time. He appears well-developed and well-nourished. No distress.  HENT:  Head: Normocephalic and atraumatic.  Right Ear: Hearing normal.  Left Ear: Hearing normal.  Nose: Nose normal.  Mouth/Throat: Oropharynx is clear and moist and mucous membranes are normal.  Eyes: Conjunctivae and EOM are normal. Pupils are equal, round, and reactive to light.  Neck: Normal range of motion. Neck supple.  Cardiovascular: Regular rhythm, S1 normal  and S2 normal.  Exam reveals no gallop and no friction rub.   No murmur heard. Pulmonary/Chest: Effort normal and breath sounds normal. No respiratory distress. He exhibits no tenderness.  Abdominal: Soft. Normal appearance and bowel sounds are normal. There is no hepatosplenomegaly. There is no tenderness. There is no rebound, no guarding, no tenderness at McBurney's point and negative Murphy's sign. No  hernia.  Musculoskeletal: Normal range of motion.  Neurological: He is alert and oriented to person, place, and time. He has normal strength. No cranial nerve deficit or sensory deficit. Coordination normal. GCS eye subscore is 4. GCS verbal subscore is 5. GCS motor subscore is 6.  Skin: Skin is warm, dry and intact. No rash noted. No cyanosis.  Psychiatric: He has a normal mood and affect. His speech is normal and behavior is normal. Thought content normal.  Nursing note and vitals reviewed.   ED Course  Procedures   DIAGNOSTIC STUDIES:  Oxygen Saturation is 100% on RA, normal by my interpretation.    COORDINATION OF CARE:  1:15 AM Discussed treatment plan with pt at bedside and pt agreed to plan.   MDM   Final diagnoses:  Other type of migraine    Presents to ER for evaluation of headache. Patient reports that his headache began yesterday. He describes a throbbing pain behind his eyes that is typical of his headaches. He reports previous diagnosis of migraines with similar symptoms. He endorses mild nausea without vomiting, photosensitivity. Patient has normal neurologic function, no concerning features with his headache. He does not require workup or imaging. Patient declines IV and IM medications.  I personally performed the services described in this documentation, which was scribed in my presence. The recorded information has been reviewed and is accurate.'    Gilda Crease, MD 03/19/15 [phone]

## 2015-03-19 NOTE — ED Notes (Signed)
Dr. Pollina at the bedside.  

## 2015-03-19 NOTE — Discharge Instructions (Signed)

## 2015-03-19 NOTE — ED Notes (Signed)
Pt verbalized understanding of d/c instructions and has no further questions. Pt stable, ambulatory and NAD upon d/c. Pt given [place] sandwich, sprite and a bus pass.

## 2015-03-20 ENCOUNTER — Emergency Department (HOSPITAL_COMMUNITY)
Admission: EM | Admit: 2015-03-20 | Discharge: 2015-03-20 | Disposition: A | Discharge status: Home / Self Care | Payer: Self-pay | Attending: Emergency Medicine | Admitting: Emergency Medicine

## 2015-03-20 ENCOUNTER — Encounter (HOSPITAL_COMMUNITY): Payer: Self-pay | Admitting: Emergency Medicine

## 2015-03-20 DIAGNOSIS — Z79899 Other long term (current) drug therapy: Secondary | ICD-10-CM | POA: Insufficient documentation

## 2015-03-20 DIAGNOSIS — Z590 Homelessness: Secondary | ICD-10-CM | POA: Insufficient documentation

## 2015-03-20 DIAGNOSIS — Z8669 Personal history of other diseases of the nervous system and sense organs: Secondary | ICD-10-CM | POA: Insufficient documentation

## 2015-03-20 DIAGNOSIS — Z792 Long term (current) use of antibiotics: Secondary | ICD-10-CM | POA: Insufficient documentation

## 2015-03-20 DIAGNOSIS — J45909 Unspecified asthma, uncomplicated: Secondary | ICD-10-CM | POA: Insufficient documentation

## 2015-03-20 DIAGNOSIS — F419 Anxiety disorder, unspecified: Secondary | ICD-10-CM | POA: Insufficient documentation

## 2015-03-20 DIAGNOSIS — F17210 Nicotine dependence, cigarettes, uncomplicated: Secondary | ICD-10-CM | POA: Insufficient documentation

## 2015-03-20 DIAGNOSIS — B353 Tinea pedis: Secondary | ICD-10-CM

## 2015-03-20 MED ORDER — IBUPROFEN 400 MG PO TABS
800.0000 mg | ORAL_TABLET | Freq: Once | ORAL | Status: AC
  Administered 2015-03-20: 800 mg via ORAL
  Filled 2015-03-20: qty 2

## 2015-03-20 MED ORDER — IBUPROFEN 800 MG PO TABS: 800.0000 mg | ORAL_TABLET | Freq: Three times a day (TID) | ORAL | Status: DC

## 2015-03-20 MED ORDER — FLUCONAZOLE 100 MG PO TABS
200.0000 mg | ORAL_TABLET | Freq: Once | ORAL | Status: AC
  Administered 2015-03-20: 200 mg via ORAL
  Filled 2015-03-20: qty 2

## 2015-03-20 MED ORDER — CEPHALEXIN 250 MG PO CAPS
500.0000 mg | ORAL_CAPSULE | Freq: Once | ORAL | Status: AC
  Administered 2015-03-20: 500 mg via ORAL
  Filled 2015-03-20: qty 2

## 2015-03-20 NOTE — ED Notes (Signed)
Patient is alert and orientedx4.  Patient was explained discharge instructions and they understood them with no questions.   

## 2015-03-20 NOTE — ED Notes (Signed)
The patient said he was seen here three days ago for the same thing and the creams are not working.  He advised he thinks he has fungus and "don't know what else."  He rated his pain 10/10.

## 2015-03-20 NOTE — ED Provider Notes (Signed)
CSN: [id number]     Arrival date & time 03/20/15  1848 History  By signing my name below, I, Soijett Blue, attest that this documentation has been prepared under the direction and in the presence of Kerrie Buffalo, NP Electronically Signed: Soijett Blue, ED Scribe. 03/20/2015. 7:32 PM.   Chief Complaint  Patient presents with  . Foot Pain    The patient said he was seen here three days ago for the same thing and the creams are not working.  He advised he thinks he has fungus and "don't know what else."      Patient is a 40 y.o. male presenting with lower extremity pain. The history is provided by the patient. No language interpreter was used.  Foot Pain This is a recurrent problem. The current episode started more than 2 days ago. The problem occurs constantly. The problem has not changed since onset.The symptoms are aggravated by walking. Nothing relieves the symptoms. Treatments tried: mycostatin. The treatment provided no relief.    Malcom Selmer is a 40 y.o. male with a medical hx of homelessness, who presents to the Emergency Department complaining of constant throbbing left foot pain onset 3 days ago. He notes that he had an abscess and infection to the area that he was Rx cream for but the cream hasn't worked for his symptoms. Pt notes that he was seen in the ED 3 days ago and he began to use the cream then. Pt states that he didn't get the antibiotic filled at the time. Pt is having associated symptoms of mild redness. He notes that he has tried Rx mycostatin with no relief of his symptoms.   Per pt chart review: Pt has had multiple visits to the ED since 2013 for foot pain. Pt was last seen in the ED on 03/17/2015 for foot pain and had no imaging of labs completed at the time. Pt was Rx keflex and mycostatin for his symptoms.   Past Medical History  Diagnosis Date  . Anxiety   . Asthma   . Homelessness   . Migraine    History reviewed. No pertinent past surgical history. History  reviewed. No pertinent family history. Social History  Substance Use Topics  . Smoking status: Current Every Day Smoker -- 2.00 packs/day for 5 years    Types: Cigarettes  . Smokeless tobacco: None  . Alcohol Use: No    Review of Systems  Musculoskeletal: Positive for arthralgias. Negative for joint swelling and gait problem.  Skin: Positive for color change, rash and wound.  All other systems reviewed and are negative.     Allergies  Review of patient's allergies indicates no known allergies.  Home Medications   Prior to Admission medications   Medication Sig Start Date End Date Taking? Authorizing Provider  albuterol (PROVENTIL HFA;VENTOLIN HFA) 108 (90 Base) MCG/ACT inhaler Inhale 1 puff into the lungs every 6 (six) hours as needed for wheezing or shortness of breath. Patient not taking: Reported on 03/19/2015 02/23/15   Beau Fanny, FNP  cephALEXin (KEFLEX) 500 MG capsule Take 1 capsule (500 mg total) by mouth 4 (four) times daily. 03/17/15   Joycie Peek, PA-C  ibuprofen (ADVIL,MOTRIN) 800 MG tablet Take 1 tablet (800 mg total) by mouth 3 (three) times daily. 03/20/15   Liese Dizdarevic Orlene Och, NP  nystatin cream (MYCOSTATIN) Apply 1 application topically 4 (four) times daily. Apply to affected area every 4-6 hours x 10 days Patient not taking: Reported on 03/19/2015 03/17/15   Sharlet Salina  Cartner, PA-C  OLANZapine zydis (ZYPREXA) 5 MG disintegrating tablet Take 1 tablet (5 mg total) by mouth at bedtime. Patient not taking: Reported on 03/19/2015 02/23/15   Beau Fanny, FNP  traZODone (DESYREL) 50 MG tablet Take 1 tablet (50 mg total) by mouth at bedtime as needed for sleep. Patient not taking: Reported on 03/19/2015 02/23/15   Everardo All Withrow, FNP   BP 125/71 mmHg  Pulse 68  Temp(Src) 99.3 F (37.4 C) (Oral)  Resp 16  SpO2 100% Physical Exam  Constitutional: He is oriented to person, place, and time. He appears well-developed and well-nourished. No distress.  HENT:  Head:  Normocephalic and atraumatic.  Right Ear: Tympanic membrane, external ear and ear canal normal.  Left Ear: Tympanic membrane, external ear and ear canal normal.  Mouth/Throat: Uvula is midline, oropharynx is clear and moist and mucous membranes are normal. No posterior oropharyngeal edema or posterior oropharyngeal erythema.  Eyes: Conjunctivae and EOM are normal. Pupils are equal, round, and reactive to light.  Neck: Neck supple.  Cardiovascular: Normal rate.   Pulmonary/Chest: Effort normal. No respiratory distress.  Musculoskeletal: Normal range of motion.  Good pulse and circulation.   Neurological: He is alert and oriented to person, place, and time.  Skin: Skin is warm and dry.  Left foot with scaling, macerated rash consistent with tinea pedis. Mild tenderness to palpation over left dorsum of foot. No overt erythema, warmth, fluctuance. Small blister are to the plantar aspect of the left foot at the base of the middle toe. Distal pulses intact with brisk cap refill.   Psychiatric: He has a normal mood and affect. His behavior is normal.  Nursing note and vitals reviewed.   ED Course  Procedures (including critical care time) DIAGNOSTIC STUDIES:                         Oxygen Saturation is 100% on RA, nl by my interpretation.    COORDINATION OF CARE: 7:31 PM Discussed treatment plan with pt at bedside which includes naprosyn Rx, keflex, diflucan and pt agreed to plan.   MDM  40 y.o. male with fungal infection of the left foot that has continued since visit 3 days ago. Patient concerned for infection but has not filled Rx for Keflex. Stable for d/c without red streaking, fever and does not appear toxic. Encouraged patient to fill Rx for Keflex will give Rx for ibuprofen for pain. Diflucan 200 mg PO now and Keflex 500 mg PO now. Referral to Podiatry.    Final diagnoses:  Tinea pedis of left foot   I personally performed the services described in this documentation, which was  scribed in my presence. The recorded information has been reviewed and is accurate.    [place], NP 03/20/15 1942  Richardean Canal, MD 03/20/15 [phone]

## 2015-03-20 NOTE — Discharge Instructions (Signed)

## 2015-03-24 ENCOUNTER — Encounter (HOSPITAL_COMMUNITY): Payer: Self-pay

## 2015-03-24 DIAGNOSIS — F17210 Nicotine dependence, cigarettes, uncomplicated: Secondary | ICD-10-CM | POA: Insufficient documentation

## 2015-03-24 DIAGNOSIS — Z590 Homelessness: Secondary | ICD-10-CM | POA: Insufficient documentation

## 2015-03-24 DIAGNOSIS — M25572 Pain in left ankle and joints of left foot: Secondary | ICD-10-CM | POA: Insufficient documentation

## 2015-03-24 DIAGNOSIS — J45909 Unspecified asthma, uncomplicated: Secondary | ICD-10-CM | POA: Insufficient documentation

## 2015-03-24 DIAGNOSIS — F419 Anxiety disorder, unspecified: Secondary | ICD-10-CM | POA: Insufficient documentation

## 2015-03-24 DIAGNOSIS — Z792 Long term (current) use of antibiotics: Secondary | ICD-10-CM | POA: Insufficient documentation

## 2015-03-24 DIAGNOSIS — M25571 Pain in right ankle and joints of right foot: Secondary | ICD-10-CM | POA: Insufficient documentation

## 2015-03-24 DIAGNOSIS — R51 Headache: Secondary | ICD-10-CM | POA: Insufficient documentation

## 2015-03-24 NOTE — ED Notes (Signed)
Pt here for headache since yesterday. Has not been having pain in the frontal region above his eyes. Denies any N/V or sensitivity to light or sounds.

## 2015-03-25 ENCOUNTER — Encounter (HOSPITAL_COMMUNITY): Payer: Self-pay | Admitting: Emergency Medicine

## 2015-03-25 ENCOUNTER — Emergency Department (HOSPITAL_COMMUNITY)
Admission: EM | Admit: 2015-03-25 | Discharge: 2015-03-25 | Disposition: A | Discharge status: Home / Self Care | Payer: Self-pay | Attending: Emergency Medicine | Admitting: Emergency Medicine

## 2015-03-25 DIAGNOSIS — R519 Headache, unspecified: Secondary | ICD-10-CM

## 2015-03-25 DIAGNOSIS — Z8659 Personal history of other mental and behavioral disorders: Secondary | ICD-10-CM | POA: Insufficient documentation

## 2015-03-25 DIAGNOSIS — M79671 Pain in right foot: Secondary | ICD-10-CM | POA: Insufficient documentation

## 2015-03-25 DIAGNOSIS — M79672 Pain in left foot: Secondary | ICD-10-CM

## 2015-03-25 DIAGNOSIS — R51 Headache: Secondary | ICD-10-CM | POA: Insufficient documentation

## 2015-03-25 DIAGNOSIS — F17210 Nicotine dependence, cigarettes, uncomplicated: Secondary | ICD-10-CM | POA: Insufficient documentation

## 2015-03-25 DIAGNOSIS — Z8679 Personal history of other diseases of the circulatory system: Secondary | ICD-10-CM | POA: Insufficient documentation

## 2015-03-25 DIAGNOSIS — Z590 Homelessness: Secondary | ICD-10-CM | POA: Insufficient documentation

## 2015-03-25 DIAGNOSIS — J45909 Unspecified asthma, uncomplicated: Secondary | ICD-10-CM | POA: Insufficient documentation

## 2015-03-25 MED ORDER — IBUPROFEN 800 MG PO TABS: 800.0000 mg | ORAL_TABLET | Freq: Three times a day (TID) | ORAL | Status: DC | PRN

## 2015-03-25 MED ORDER — IBUPROFEN 800 MG PO TABS
800.0000 mg | ORAL_TABLET | Freq: Once | ORAL | Status: AC
  Administered 2015-03-25: 800 mg via ORAL
  Filled 2015-03-25: qty 1

## 2015-03-25 NOTE — ED Notes (Signed)
Pt. reports persistent headache for several days , seen here this morning for the same complaint discharged home with prescription but did not fill prescription " I lost the prescription" , pt. added left foot pain , denies injury/ambulatory.

## 2015-03-25 NOTE — Discharge Instructions (Signed)
Please keep your feet elevated when at rest. Please keep your feet warm and dry. Please change your socks once a day. You may use crutches as needed to help you ambulate.    General Headache Without Cause A headache is pain or discomfort felt around the head or neck area. The specific cause of a headache may not be found. There are many causes and types of headaches. A few common ones are:  Tension headaches.  Migraine headaches.  Cluster headaches.  Chronic daily headaches. HOME CARE INSTRUCTIONS  Watch your condition for any changes. Take these steps to help with your condition: Managing Pain  Take over-the-counter and prescription medicines only as told by your health care provider.  Lie down in a dark, quiet room when you have a headache.  If directed, apply ice to the head and neck area:  Put ice in a plastic bag.  Place a towel between your skin and the bag.  Leave the ice on for 20 minutes, 2-3 times per day.  Use a heating pad or hot shower to apply heat to the head and neck area as told by your health care provider.  Keep lights dim if bright lights bother you or make your headaches worse. Eating and Drinking  Eat meals on a regular schedule.  Limit alcohol use.  Decrease the amount of caffeine you drink, or stop drinking caffeine. General Instructions  Keep all follow-up visits as told by your health care provider. This is important.  Keep a headache journal to help find out what may trigger your headaches. For example, write down:  What you eat and drink.  How much sleep you get.  Any change to your diet or medicines.  Try massage or other relaxation techniques.  Limit stress.  Sit up straight, and do not tense your muscles.  Do not use tobacco products, including cigarettes, chewing tobacco, or e-cigarettes. If you need help quitting, ask your health care provider.  Exercise regularly as told by your health care provider.  Sleep on a regular  schedule. Get 7-9 hours of sleep, or the amount recommended by your health care provider. SEEK MEDICAL CARE IF:   Your symptoms are not helped by medicine.  You have a headache that is different from the usual headache.  You have nausea or you vomit.  You have a fever. SEEK IMMEDIATE MEDICAL CARE IF:   Your headache becomes severe.  You have repeated vomiting.  You have a stiff neck.  You have a loss of vision.  You have problems with speech.  You have pain in the eye or ear.  You have muscular weakness or loss of muscle control.  You lose your balance or have trouble walking.  You feel faint or pass out.  You have confusion.   This information is not intended to replace advice given to you by your health care provider. Make sure you discuss any questions you have with your health care provider.   Document Released: 01/18/2005 Document Revised: 10/09/2014 Document Reviewed: 05/13/2014 Elsevier Interactive Patient Education 2016 [employer].      Emergency Department Resource Guide 1) Find a Doctor and Pay Out of Pocket Although you won't have to find out who is covered by your insurance plan, it is a good idea to ask around and get recommendations. You will then need to call the office and see if the doctor you have chosen will accept you as a new patient and what types of options they offer  for patients who are self-pay. Some doctors offer discounts or will set up payment plans for their patients who do not have insurance, but you will need to ask so you aren't surprised when you get to your appointment.  2) Contact Your Local Health Department Not all health departments have doctors that can see patients for sick visits, but many do, so it is worth a call to see if yours does. If you don't know where your local health department is, you can check in your phone book. The CDC also has a tool to help you locate your state's health department, and many state websites  also have listings of all of their local health departments.  3) Find a Walk-in Clinic If your illness is not likely to be very severe or complicated, you may want to try a walk in clinic. These are popping up all over the country in pharmacies, drugstores, and shopping centers. They're usually staffed by nurse practitioners or physician assistants that have been trained to treat common illnesses and complaints. They're usually fairly quick and inexpensive. However, if you have serious medical issues or chronic medical problems, these are probably not your best option.  No Primary Care Doctor: - Call Health Connect at  [phone] - they can help you locate a primary care doctor that  accepts your insurance, provides certain services, etc. - Physician Referral Service- [phone]  Chronic Pain Problems: Organization         Address  Phone   Notes  [facility]  [phone] Patients need to be referred by their primary care doctor.   Medication Assistance: Organization         Address  Phone   Notes  [facility] Medication [facility] [address] [place], [place] [address] [phone] --Must be a resident of [facility] -- Must have NO insurance coverage whatsoever (no Medicaid/ Medicare, etc.) -- The pt. MUST have a primary care doctor that directs their care regularly and follows them in the community   [facility]  [phone]   [employer]  [phone]    Agencies that provide inexpensive medical care: Organization         Address  Phone   Notes  [facility]  [phone]   [facility]    [phone]   [facility] [address] [place], [place] [address] [phone]   [facility] [address] [place] [place] [phone]   Planned Parenthood    [phone]   [facility]    [phone]   [facility]   [address], [place] Phone:  [phone], Fax:  [phone] Hours of Operation:  9 am - 6 pm, M-F.  Also accepts Medicaid/Medicare and self-pay.  [facility]  [address], [place] Phone: [phone], Fax: [phone]. Hours of Operation:  8:30 am - 5:30 pm, M-F.  Also accepts Medicaid and self-pay.  [facility] [place] [address] [place] Phone: [phone]   [facility] [address] Natasha Bence [place], [place] [phone], Ext. [phone] Mondays & Thursdays: 7-9 AM.  First 15 patients are seen on a first come, first serve basis.    Medicaid-accepting [facility] Providers:  Organization         Address  Phone   Notes  [employer]  [address], [place] [phone] Also accepts self-pay patients.  [facility] [place] [facility] [address] Laurell Josephs [place], [place]  [phone]   [facility] [address], [place] [phone]   [facility] [address], [place] [phone]   Renaye Rakers [address], [place]   [phone] Only accepts [place] Access [place] patients after they have their name applied to their card.   Self-Pay (no insurance) in [facility]:  Organization         Address  Phone   Notes  Sickle Cell Patients, [facility] [address] [place], [place] [phone]   [facility] [address] [place], [place] [phone]   [facility] [place]  [address], [place] [phone]   [facility]/Dr. Osei-Bonsu  [address] [place] or [address], [place] [phone] Phone number for both [place] and [place] locations is the same.  [facility] [address] [place] [phone]   [facility] [address]  [place] or [address] [phone]   [facility] [address], [place] [phone], phone; [phone], fax Sees patients 1st and 3rd Saturday of every month.  Must not qualify for public or private insurance (i.e. Medicaid, Medicare, [place] Health Choice, Veterans' Benefits)  Household income should be no more than 200% of the poverty level The clinic cannot treat you if you are pregnant or think you are pregnant  Sexually transmitted diseases are not treated at the clinic.    Dental Care: Organization         Address  Phone  Notes  [facility] of [facility] [address] [place], [place] [phone] Accepts children up to age 52 who are enrolled in [place] or [place] Health Choice; pregnant women with a Medicaid card; and children who have applied for Medicaid or [place] Health Choice, but were declined, whose parents can pay a reduced fee at time of service.  [facility] of [facility]  [address], [place] [phone] Accepts children up to age 63 who are enrolled in [place] or [place] Health Choice; pregnant women with a Medicaid card; and children who have applied for Medicaid or [place] Health Choice, but were declined, whose parents can pay a reduced fee at time of service.  [facility]  [address] [place], [place] [phone] Patients are seen by appointment only. Walk-ins are not accepted. [facility] will see patients 27 years of age and older. Monday - Tuesday (8am-5pm) Most Wednesdays (8:30-5pm) $30 per visit, cash only  [facility] Adult [facility]  [address], [facility] [phone] Patients are seen by appointment only. Walk-ins are not accepted. [facility] will see patients 60 years of age and older. One Wednesday Evening (Monthly: Volunteer Based).  $30 per visit, cash only  [employer]  [phone] for adults; Children under age 105, call Graduate Pediatric Dentistry at [phone]. Children aged 42-14, please call [phone] to request a pediatric application.  Dental services are provided in all areas of dental care including fillings, crowns and bridges, complete and partial dentures, implants, gum  treatment, root canals, and extractions. Preventive care is also provided. Treatment is provided to both adults and children. Patients are selected via a lottery and there is often a waiting list.   [facility] [address] [place]  [phone] [email or web address]   [facility] [address] [place], [place] [phone], Ext. [phone] Second and Fourth Thursday of each month, opens at 6:30 AM; Clinic ends at 9 AM.  Patients are seen on a first-come first-served basis, and a limited number are seen during each clinic.   [facility]  [address] Ether [place]ins [place], [place] [phone]   Eligibility Requirements You must have lived in [place], [place], or [place] counties for at least the last three months.   You cannot be eligible for state or federal sponsored [place], including [employer], [place], or [employer].   You generally cannot be eligible for healthcare insurance through your employer.    How to apply: Eligibility screenings are held every Tuesday and Wednesday afternoon from 1:00 pm until 4:00 pm. You do not need an appointment for the interview!  [facility] [address] [place], [place] [phone]   [facility] Health Department  [phone]   [facility] Health Department  [phone]   [facility] Health Department  [phone]    Behavioral Health Resources in the Community: Intensive Outpatient Programs Organization         Address  Phone  Notes  [facility] [address] [place], [place] [phone]     [facility] [address] [place], [place] [phone]   [facility] [address], [place], [place]  [phone]   [facility] Mental Health [address],  [place], [place] [phone] or [phone]   Substance Abuse Resources Organization         Address  Phone  Notes  [facility]  [phone]   [facility]  [phone]   [facility]  [phone]   Floydene Flock  [phone]   Residential & Outpatient Substance Abuse Program  [phone]   Psychological Services Organization         Address  Phone  Notes  [facility]  [phone]   [facility]  [phone]   [facility] [address] [place] [phone] or [phone]    Mobile Crisis Teams Organization         Address  Phone  Notes  [facility], Mobile Crisis Care Unit  [phone]   [facility]  [address] [place], [place] [phone]   Doristine Locks [address] [place] [phone]    Self-Help/Support Groups Organization         Address  Phone             Notes  [facility] [place] - variety of support groups  336- I7437963 Call for more information  Narcotics Anonymous (NA), [facility] [address], [employer] [place]  2 meetings at this location   Statistician         Address  Phone  Notes  [facility] [address],    [place]  [phone]   [facility]  [address] [place] [address], [place], [place] [phone]   [facility] [address] [place], [place] [phone] Admissions: 8am-3pm M-F  [facility] [address]    [place], [place] [phone]   [facility] [address] [place], [place], [place] [phone]   [facility] [address]  [place],  [place] [phone]   [facility] - Intensive Outpatient [address] Laurell Josephs [address], [place], [place] [phone]   [facility] ([facility] [address] [place]  [place], [place] [phone] or [phone]   [facility] ([facility]) [address], [place], [place] [phone] Accepts Medicaid  [facility] [address],  [place] [phone] Substance Abuse/Addiction Treatment   [facility] Organization         Address  Phone  Notes  [facility]  [phone]   Angie Fava, PhD [address] Ervin Knack [place], [place]   [phone] or ([phone]   [facility] Behavioral   [address] [place], [place] [phone]   [facility] [address], [place], [place] [phone] Insurance/Medicaid/sponsorship through [facility] [address]                                    [place], [place] [phone] Therapy/tele-psych/case  [facility] [address], [place] [phone]    Dr. Lolly Mustache  [phone]   [facility]  [employer] [facility] Dept. 1) [address] [place] 2) [address], [place] 3)  [address], [place] [phone]  [phone]   [facility] Child Abuse Hotline [phone] or [phone] (After Hours)

## 2015-03-25 NOTE — ED Notes (Signed)
Pts name called for vital sign reassessment - no answer.  

## 2015-03-25 NOTE — ED Notes (Signed)
Dr. Ward at the bedside.  

## 2015-03-25 NOTE — ED Provider Notes (Addendum)
By signing my name below, I, Arlan Organ, attest that this documentation has been prepared under the direction and in the presence of Oluwadarasimi Redmon N Tristin Vandeusen, DO.  Electronically Signed: Arlan Organ, ED Scribe. 03/25/2015. 4:06 AM.   TIME SEEN: 4:02 AM   CHIEF COMPLAINT:  Chief Complaint  Patient presents with  . Headache     HPI:  HPI Comments: Dustin Norris is a 40 y.o. male with a PMHx of HAs an homelessness who presents to the Emergency Department complaining of constant, ongoing HA x 2-3 days. Pt states current HA feels similar to previous HAs. No aggravating or alleviating factors at this time. HAs are not exacerbated with bright lights. No OTC medications or home remedies attempted prior to arrival. No recent fever, chills, nausea, vomiting, chest pain, visual changes, or shortness of breath.  Pt also reports constant, ongoing, worsening pain and swelling to feet bilaterally; L greater than R x several days. Discomfort is exacerbated with ambulation. No alleviating factors at this time. Pt states he is on his feet often and works constantly without a lot of time to rest. Pt states he attempts to change his socks out as often as possible. Currently he is homeless. He has had blisters to the bottom of his feet. He is not a diabetic.  He denies any history of injury to his feet. He states he is on his feet all the time.  PCP: No PCP Per Patient    ROS: See HPI Constitutional: no fever  Eyes: no drainage  ENT: no runny nose   Cardiovascular:  no chest pain  Resp: no SOB  GI: no vomiting GU: no dysuria Integumentary: no rash  Allergy: no hives  Musculoskeletal: Positive leg swelling and arthralgias  Neurological: no slurred speech. Positive HA ROS otherwise negative  PAST MEDICAL HISTORY/PAST SURGICAL HISTORY:  Past Medical History  Diagnosis Date  . Anxiety   . Asthma   . Homelessness   . Migraine     MEDICATIONS:  Prior to Admission medications   Medication Sig Start  Date End Date Taking? Authorizing Provider  albuterol (PROVENTIL HFA;VENTOLIN HFA) 108 (90 Base) MCG/ACT inhaler Inhale 1 puff into the lungs every 6 (six) hours as needed for wheezing or shortness of breath. Patient not taking: Reported on 03/19/2015 02/23/15   Beau Fanny, FNP  cephALEXin (KEFLEX) 500 MG capsule Take 1 capsule (500 mg total) by mouth 4 (four) times daily. 03/17/15   Joycie Peek, PA-C  ibuprofen (ADVIL,MOTRIN) 800 MG tablet Take 1 tablet (800 mg total) by mouth 3 (three) times daily. 03/20/15   Hope Orlene Och, NP  nystatin cream (MYCOSTATIN) Apply 1 application topically 4 (four) times daily. Apply to affected area every 4-6 hours x 10 days Patient not taking: Reported on 03/19/2015 03/17/15   Joycie Peek, PA-C  OLANZapine zydis (ZYPREXA) 5 MG disintegrating tablet Take 1 tablet (5 mg total) by mouth at bedtime. Patient not taking: Reported on 03/19/2015 02/23/15   Beau Fanny, FNP  traZODone (DESYREL) 50 MG tablet Take 1 tablet (50 mg total) by mouth at bedtime as needed for sleep. Patient not taking: Reported on 03/19/2015 02/23/15   Beau Fanny, FNP    ALLERGIES:  No Known Allergies  SOCIAL HISTORY:  Social History  Substance Use Topics  . Smoking status: Current Every Day Smoker -- 2.00 packs/day for 5 years    Types: Cigarettes  . Smokeless tobacco: Not on file  . Alcohol Use: No    FAMILY HISTORY:  No family history on file.  EXAM: BP 109/83 mmHg  Pulse 93  Temp(Src) 98.2 F (36.8 C) (Oral)  Resp 22  Ht  (1.676 m)  Wt 148 lb 5 oz (67.274 kg)  BMI 23.95 kg/m2  SpO2 98% CONSTITUTIONAL: Alert and oriented and responds appropriately to questions. Well-appearing; well-nourished HEAD: Normocephalic EYES: Conjunctivae clear, PERRL ENT: normal nose; no rhinorrhea; moist mucous membranes; pharynx without lesions noted NECK: Supple, no meningismus, no LAD  CARD: RRR; S1 and S2 appreciated; no murmurs, no clicks, no rubs, no gallops RESP: Normal  chest excursion without splinting or tachypnea; breath sounds clear and equal bilaterally; no wheezes, no rhonchi, no rales, no hypoxia or respiratory distress, speaking full sentences ABD/GI: Normal bowel sounds; non-distended; soft, non-tender, no rebound, no guarding, no peritoneal signs BACK:  The back appears normal and is non-tender to palpation, there is no CVA tenderness EXT: Normal ROM in all joints; non-tender to palpation; no edema; normal capillary refill; no cyanosis, no calf tenderness or swelling; tender to the bottom of his soles bilaterally with skin irritation but no ulcers or signs of infection, 2+ DP pulses bilaterally SKIN: Normal color for age and race; warm NEURO: Moves all extremities equally, sensation to light touch intact diffusely, cranial nerves II through XII intact PSYCH: The patient's mood and manner are appropriate. Grooming and personal hygiene are appropriate.  MEDICAL DECISION MAKING: Patient here with complaints of headache. States he's had similar headaches in the past. Denies head injury or being on anticoagulation. No fever, neck pain or nuchal rigidity. No neurologic deficits. Doubt intracranial hemorrhage or infectious etiology.  Requesting Tylenol or ibuprofen for pain. I do not feel he needs emergent head imaging.  Also complaining to pain to both of his feet. He does have wet socks on currently. He has some skin breakdown with no obvious open lesions, bleeding or sign of infection. He is neurovascularly intact distally. Have advised him to keep his feet warm and dry when possible. He complains of 1 foot hurting more than the other. We'll discharge with crutches to use as needed. I feel he can use Tylenol and ibuprofen for pain. I feel he is safe to be discharged. Discussed return precautions. He verbalizes understanding and is comfortable with this plan. I will give him outpatient follow-up information as well.    I personally performed the services  described in this documentation, which was scribed in my presence. The recorded information has been reviewed and is accurate.   Layla Maw Keno Caraway, DO 03/25/15 [id number]  Layla Maw Bri Wakeman, DO 03/25/15 [phone]

## 2015-03-26 ENCOUNTER — Emergency Department (HOSPITAL_COMMUNITY)
Admission: EM | Admit: 2015-03-26 | Discharge: 2015-03-26 | Disposition: A | Discharge status: Home / Self Care | Payer: Self-pay | Attending: Emergency Medicine | Admitting: Emergency Medicine

## 2015-03-26 DIAGNOSIS — M79672 Pain in left foot: Secondary | ICD-10-CM

## 2015-03-26 DIAGNOSIS — M79671 Pain in right foot: Secondary | ICD-10-CM

## 2015-03-26 DIAGNOSIS — R519 Headache, unspecified: Secondary | ICD-10-CM

## 2015-03-26 DIAGNOSIS — R51 Headache: Secondary | ICD-10-CM

## 2015-03-26 MED ORDER — KETOROLAC TROMETHAMINE 30 MG/ML IJ SOLN
30.0000 mg | Freq: Once | INTRAMUSCULAR | Status: AC
  Administered 2015-03-26: 30 mg via INTRAVENOUS
  Filled 2015-03-26: qty 1

## 2015-03-26 MED ORDER — DEXAMETHASONE SODIUM PHOSPHATE 10 MG/ML IJ SOLN
10.0000 mg | Freq: Once | INTRAMUSCULAR | Status: AC
  Administered 2015-03-26: 10 mg via INTRAVENOUS
  Filled 2015-03-26: qty 1

## 2015-03-26 MED ORDER — SODIUM CHLORIDE 0.9 % IV SOLN
1000.0000 mL | Freq: Once | INTRAVENOUS | Status: AC
  Administered 2015-03-26: 1000 mL via INTRAVENOUS

## 2015-03-26 MED ORDER — SODIUM CHLORIDE 0.9 % IV SOLN
1000.0000 mL | INTRAVENOUS | Status: DC
  Administered 2015-03-26: 1000 mL via INTRAVENOUS

## 2015-03-26 MED ORDER — METOCLOPRAMIDE HCL 10 MG PO TABS: 10.0000 mg | ORAL_TABLET | Freq: Four times a day (QID) | ORAL | Status: DC | PRN

## 2015-03-26 MED ORDER — METOCLOPRAMIDE HCL 5 MG/ML IJ SOLN
10.0000 mg | Freq: Once | INTRAMUSCULAR | Status: AC
  Administered 2015-03-26: 10 mg via INTRAVENOUS
  Filled 2015-03-26: qty 2

## 2015-03-26 MED ORDER — DIPHENHYDRAMINE HCL 50 MG/ML IJ SOLN
25.0000 mg | Freq: Once | INTRAMUSCULAR | Status: AC
  Administered 2015-03-26: 25 mg via INTRAVENOUS
  Filled 2015-03-26: qty 1

## 2015-03-26 NOTE — Discharge Instructions (Signed)
General Headache Without Cause °A headache is pain or discomfort felt around the head or neck area. The specific cause of a headache may not be found. There are many causes and types of headaches. A few common ones are: °· Tension headaches. °· Migraine headaches. °· Cluster headaches. °· Chronic daily headaches. °HOME CARE INSTRUCTIONS  °Watch your condition for any changes. Take these steps to help with your condition: °Managing Pain °· Take over-the-counter and prescription medicines only as told by your health care provider. °· Lie down in a dark, quiet room when you have a headache. °· If directed, apply ice to the head and neck area: °¨ Put ice in a plastic bag. °¨ Place a towel between your skin and the bag. °¨ Leave the ice on for 20 minutes, 2-3 times per day. °· Use a heating pad or hot shower to apply heat to the head and neck area as told by your health care provider. °· Keep lights dim if bright lights bother you or make your headaches worse. °Eating and Drinking °· Eat meals on a regular schedule. °· Limit alcohol use. °· Decrease the amount of caffeine you drink, or stop drinking caffeine. °General Instructions °· Keep all follow-up visits as told by your health care provider. This is important. °· Keep a headache journal to help find out what may trigger your headaches. For example, write down: °¨ What you eat and drink. °¨ How much sleep you get. °¨ Any change to your diet or medicines. °· Try massage or other relaxation techniques. °· Limit stress. °· Sit up straight, and do not tense your muscles. °· Do not use tobacco products, including cigarettes, chewing tobacco, or e-cigarettes. If you need help quitting, ask your health care provider. °· Exercise regularly as told by your health care provider. °· Sleep on a regular schedule. Get 7-9 hours of sleep, or the amount recommended by your health care provider. °SEEK MEDICAL CARE IF:  °· Your symptoms are not helped by medicine. °· You have a  headache that is different from the usual headache. °· You have nausea or you vomit. °· You have a fever. °SEEK IMMEDIATE MEDICAL CARE IF:  °· Your headache becomes severe. °· You have repeated vomiting. °· You have a stiff neck. °· You have a loss of vision. °· You have problems with speech. °· You have pain in the eye or ear. °· You have muscular weakness or loss of muscle control. °· You lose your balance or have trouble walking. °· You feel faint or pass out. °· You have confusion. °  °This information is not intended to replace advice given to you by your health care provider. Make sure you discuss any questions you have with your health care provider. °  °Document Released: 01/18/2005 Document Revised: 10/09/2014 Document Reviewed: 05/13/2014 °Elsevier Interactive Patient Education ©2016 Elsevier Inc. ° °Metoclopramide tablets °What is this medicine? °METOCLOPRAMIDE (met oh kloe PRA mide) is used to treat the symptoms of gastroesophageal reflux disease (GERD) like heartburn. It is also used to treat people with slow emptying of the stomach and intestinal tract. °This medicine may be used for other purposes; ask your health care provider or pharmacist if you have questions. °What should I tell my health care provider before I take this medicine? °They need to know if you have any of these conditions: °-breast cancer °-depression °-diabetes °-heart failure °-high blood pressure °-kidney disease °-liver disease °-Parkinson's disease or a movement disorder °-pheochromocytoma °-seizures °-stomach obstruction,   bleeding, or perforation -an unusual or allergic reaction to metoclopramide, procainamide, sulfites, other medicines, foods, dyes, or preservatives -pregnant or trying to get pregnant -breast-feeding How should I use this medicine? Take this medicine by mouth with a glass of water. Follow the directions on the prescription label. Take this medicine on an empty stomach, about 30 minutes before eating. Take  your doses at regular intervals. Do not take your medicine more often than directed. Do not stop taking except on the advice of your doctor or health care professional. A special MedGuide will be given to you by the pharmacist with each prescription and refill. Be sure to read this information carefully each time. Talk to your pediatrician regarding the use of this medicine in children. Special care may be needed. Overdosage: If you think you have taken too much of this medicine contact a poison control center or emergency room at once. NOTE: This medicine is only for you. Do not share this medicine with others. What if I miss a dose? If you miss a dose, take it as soon as you can. If it is almost time for your next dose, take only that dose. Do not take double or extra doses. What may interact with this medicine? -acetaminophen -cyclosporine -digoxin -medicines for blood pressure -medicines for diabetes, including insulin -medicines for hay fever and other allergies -medicines for depression, especially an Monoamine Oxidase Inhibitor (MAOI) -medicines for Parkinson's disease, like levodopa -medicines for sleep or for pain -tetracycline This list may not describe all possible interactions. Give your health care provider a list of all the medicines, herbs, non-prescription drugs, or dietary supplements you use. Also tell them if you smoke, drink alcohol, or use illegal drugs. Some items may interact with your medicine. What should I watch for while using this medicine? It may take a few weeks for your stomach condition to start to get better. However, do not take this medicine for longer than 12 weeks. The longer you take this medicine, and the more you take it, the greater your chances are of developing serious side effects. If you are an elderly patient, a male patient, or you have diabetes, you may be at an increased risk for side effects from this medicine. Contact your doctor immediately if  you start having movements you cannot control such as lip smacking, rapid movements of the tongue, involuntary or uncontrollable movements of the eyes, head, arms and legs, or muscle twitches and spasms. Patients and their families should watch out for worsening depression or thoughts of suicide. Also watch out for any sudden or severe changes in feelings such as feeling anxious, agitated, panicky, irritable, hostile, aggressive, impulsive, severely restless, overly excited and hyperactive, or not being able to sleep. If this happens, especially at the beginning of treatment or after a change in dose, call your doctor. Do not treat yourself for high fever. Ask your doctor or health care professional for advice. You may get drowsy or dizzy. Do not drive, use machinery, or do anything that needs mental alertness until you know how this drug affects you. Do not stand or sit up quickly, especially if you are an older patient. This reduces the risk of dizzy or fainting spells. Alcohol can make you more drowsy and dizzy. Avoid alcoholic drinks. What side effects may I notice from receiving this medicine? Side effects that you should report to your doctor or health care professional as soon as possible: -allergic reactions like skin rash, itching or hives, swelling of the  face, lips, or tongue -abnormal production of milk in females -breast enlargement in both males and females -change in the way you walk -difficulty moving, speaking or swallowing -drooling, lip smacking, or rapid movements of the tongue -excessive sweating -fever -involuntary or uncontrollable movements of the eyes, head, arms and legs -irregular heartbeat or palpitations -muscle twitches and spasms -unusually weak or tired Side effects that usually do not require medical attention (report to your doctor or health care professional if they continue or are bothersome): -change in sex drive or performance -depressed  mood -diarrhea -difficulty sleeping -headache -menstrual changes -restless or nervous This list may not describe all possible side effects. Call your doctor for medical advice about side effects. You may report side effects to FDA at 1-800-FDA-1088. Where should I keep my medicine? Keep out of the reach of children. Store at room temperature between 20 and 25 degrees C (68 and 77 degrees F). Protect from light. Keep container tightly closed. Throw away any unused medicine after the expiration date. NOTE: This sheet is a summary. It may not cover all possible information. If you have questions about this medicine, talk to your doctor, pharmacist, or health care provider.    2016, Elsevier/Gold Standard. (2011-05-18 13:04:38)   Emergency Department Resource Guide 1) Find a Doctor and Pay Out of Pocket Although you won't have to find out who is covered by your insurance plan, it is a good idea to ask around and get recommendations. You will then need to call the office and see if the doctor you have chosen will accept you as a new patient and what types of options they offer for patients who are self-pay. Some doctors offer discounts or will set up payment plans for their patients who do not have insurance, but you will need to ask so you aren't surprised when you get to your appointment.  2) Contact Your Local Health Department Not all health departments have doctors that can see patients for sick visits, but many do, so it is worth a call to see if yours does. If you don't know where your local health department is, you can check in your phone book. The CDC also has a tool to help you locate your state's health department, and many state websites also have listings of all of their local health departments.  3) Find a [facility] If your illness is not likely to be very severe or complicated, you may want to try a walk in clinic. These are popping up all over the country in pharmacies,  drugstores, and shopping centers. They're usually staffed by nurse practitioners or physician assistants that have been trained to treat common illnesses and complaints. They're usually fairly quick and inexpensive. However, if you have serious medical issues or chronic medical problems, these are probably not your best option.  No Primary Care Doctor: - Call Health Connect at  [phone] - they can help you locate a primary care doctor that  accepts your insurance, provides certain services, etc. - Physician Referral Service- [phone]  Chronic Pain Problems: Organization         Address  Phone   Notes  [facility]  [phone] Patients need to be referred by their primary care doctor.   Medication Assistance: Organization         Address  Phone   Notes  [facility] Medication [facility] [address] [place], [place] [address] [phone] --Must be a resident of  [place] -- Must have NO insurance coverage whatsoever (no Medicaid/ Medicare, etc.) -- The pt. MUST have a primary care doctor that directs their care regularly and follows them in the community   MedAssist  [phone]   [employer]  [phone]    Agencies that provide inexpensive medical care: Organization         Address  Phone   Notes  [place]  [phone]   [facility]    [phone]   [facility] [place], [place] [address] [phone]   [place] [address], [place] [phone]   Planned Parenthood    [phone]   [facility]    [phone]   [place] and [address], [place] Phone:  [phone], Fax:  [phone] Hours of Operation:  9 am - 6 pm, M-F.  Also accepts Medicaid/Medicare and self-pay.  [facility] for [place], [address], [place] Phone:  [phone], Fax: [phone]. Hours of Operation:  8:30 am - 5:30 pm, M-F.  Also accepts Medicaid and self-pay.  [facility] [place] [address], [place] Phone: [phone]   [place], [place], [place] [phone], Ext. [phone] Mondays & Thursdays: 7-9 AM.  First 15 patients are seen on a first come, first serve basis.    [place] Providers:  Organization         Address  Phone   Notes  [facility] [address], [place] [phone] Also accepts self-pay patients.  [facility] [address], [place]  [phone]   [place], [address], [place] [phone]   [facility] [address] [place] [phone]   Lucianne Lei [address], [place]   [phone] Only accepts [place] Access [place] patients after they have their name applied to their card.   Self-Pay (no insurance) in [facility]:  Organization         Address  Phone   Notes  Sickle Cell Patients, [facility] [place] [phone]   [facility] [place] [phone]   [facility] [place]  [place], [place], [place] [phone]   [facility]/Dr. Osei-Bonsu  [address], [place] or [address], [place] [phone] Phone number for both [place] and [place] locations is the same.  [facility] [address] [place] [phone]   [facility] [address] [place] or [address] [phone]   [facility] [address] [place] [phone], phone; [phone], fax Sees patients 1st and 3rd Saturday of every month.  Must not qualify for public  or private insurance (i.e. Medicaid, Medicare, [place] Health Choice, Veterans' Benefits)  Household income should be no more than 200% of the poverty level The clinic cannot treat you if you are pregnant or think you are pregnant  Sexually transmitted diseases are not treated at the clinic.    Dental Care: Organization         Address  Phone  Notes  [facility] Department of [facility] [place], [place] [phone] Accepts children up to age 28 who are enrolled in [place] or [place]; pregnant women with a Medicaid card; and children who have applied for Medicaid or [place] Health Choice, but were declined, whose parents can pay a reduced fee at time of service.  [facility] : [facility] Department of [facility]  [address], [place] [phone] Accepts children up to age 57 who are enrolled in [place] or [place]; pregnant women with a Medicaid card; and children who have applied for Medicaid or [place] Health Choice, but were declined, whose parents can pay a reduced fee at time of service.  [place] Adult Dental Access PROGRAM  [place] [phone] Patients are seen by appointment only. Walk-ins are not accepted. [place] will see patients 52 years of age and older. Monday - Tuesday (8am-5pm) Most Wednesdays (8:30-5pm) $30 per visit, cash only  [facility] Adult Dental Access PROGRAM  [address], [place] [facility] [phone] Patients are seen by appointment only. Walk-ins are not accepted. [place] will see patients 75 years of age and older. One Wednesday Evening (Monthly: Volunteer Based).  $30 per visit, cash only  [place]  [phone] for adults; Children under age 41, call Graduate Pediatric Dentistry at [phone]. Children aged 4-14, please call [phone] to request a pediatric application.  Dental services are provided in all areas of  dental care including fillings, crowns and bridges, complete and partial dentures, implants, gum treatment, root canals, and extractions. Preventive care is also provided. Treatment is provided to both adults and children. Patients are selected via a lottery and there is often a waiting list.   [facility] [address] [place]  [phone] [email or web address]   [facility] [address] [place], [place] [phone], Ext. [phone] Second and Fourth Thursday of each month, opens at 6:30 AM; Clinic ends at 9 AM.  Patients are seen on a first-come first-served basis, and a limited number are seen during each clinic.   [facility]  [address] [place]ard Danker [place], [place] [phone]   Eligibility Requirements You must have lived in [place], [place], or [place] counties for at least the last three months.   You cannot be eligible for state or federal sponsored [employer], including [employer], [place], or [employer].   You generally cannot be eligible for healthcare insurance through your employer.    How to apply: Eligibility screenings are held every Tuesday and Wednesday afternoon from 1:00 pm until 4:00 pm. You do not need an appointment for the interview!  [facility] [address] [place], [place]   [place]  [place] Department  [place]  [phone]    Behavioral Health Resources in the Community: Intensive Outpatient Programs Organization         Address  Phone  Notes  [place]. [address] [place], [place] [phone]   [facility] [address] [place], [place]   ADS: Alcohol & Drug Svcs [address], [place], [place]   [place] [address],  [place], [place] or  [phone]   Substance Abuse Resources Organization  Address  Phone  Notes  Alcohol and Drug Services  [phone]   [place]  [phone]   [facility] [place]  [phone]   Chinita Pester  [phone]   Residential & Outpatient Substance Abuse Program  [phone]   Psychological Services Organization         Address  Phone  Notes  [facility] [place]  [place]  [phone]   [place] [address], [place] or [phone]    Mobile Crisis Teams Organization         Address  Phone  Notes  [facility], Mobile Crisis Care Unit  [phone]   [facility]  [address] [place], [place]   [place] [address] [place] [phone]    Self-Help/Support Groups Organization         Address  Phone             Notes  [place]. of [place] - variety of support groups  [place] Call for more information  Narcotics Anonymous (NA), [facility] [address], [employer]  2 meetings at this location   Special educational needs teacher         Address  Phone  Notes  [facility] Residential Treatment [place],    [place]  [phone]   [facility]  [address], [place] [phone], [place], [place]   [place], [place] [phone] Admissions: 8am-3pm M-F  Incentives Substance [place] [address]    [place], [place] [phone]   [facility] [address] [place], [place], [place]   [facility] [address],  [place], [place]   [facility] - Intensive Outpatient [address], [place], [place]   [facility] ([place].,  [place], [place] [phone] or [phone]   Residential  Treatment Services (RTS) [address] [place], [place] Accepts Medicaid  [facility] [place] [address],  [place] [phone] Substance Abuse/Addiction Treatment   [facility] Organization         Address  Phone  Notes  [facility]  [phone]   Domenic Schwab, PhD [address] Arlis Porta [place], [place]   [phone] or [phone]   [place], [place] [phone]   [facility] [address], [place], [place] [phone] Insurance/Medicaid/sponsorship through [facility] and Families [address] [place]                                    [place], [place] [phone] [place] [address], [place] [phone]    Dr. Adele Schilder  [phone]   [facility] Dept. 1) [address] [place] 2) [place] 3)  [place] [address], [place] [phone]  [phone]   [place] [phone] or [phone] (After Hours)

## 2015-03-26 NOTE — ED Provider Notes (Signed)
CSN: [id number]     Arrival date & time 03/25/15  2218 History   First MD Initiated Contact with Patient 03/26/15 [phone]     Chief Complaint  Patient presents with  . Headache  . Foot Pain     (Consider location/radiation/quality/duration/timing/severity/associated sxs/prior Treatment) Patient is a 40 y.o. male presenting with headaches and lower extremity pain. The history is provided by the patient.  Headache Foot Pain Associated symptoms include headaches.  He Says that he has been having a migraine headache for the last 2-3 days. Headache is in the central part of the forehead and described as a throbbing pain which he rates at 10/10. It is somewhat worse with exposure to light. No phonophobia. He denies visual change, nausea, vomiting, weakness, numbness. I mentioned to him that he had been in the emergency department yesterday and treated for his headache and he stated that he did ask a few better when he left the ED but it recurred a few hours later. He is also having pain in his feet which is unchanged.  Past Medical History  Diagnosis Date  . Anxiety   . Asthma   . Homelessness   . Migraine    History reviewed. No pertinent past surgical history. No family history on file. Social History  Substance Use Topics  . Smoking status: Current Every Day Smoker -- 0.00 packs/day for 0 years    Types: Cigarettes  . Smokeless tobacco: None  . Alcohol Use: No    Review of Systems  Neurological: Positive for headaches.  All other systems reviewed and are negative.     Allergies  Review of patient's allergies indicates no known allergies.  Home Medications   Prior to Admission medications   Medication Sig Start Date End Date Taking? Authorizing Provider  ibuprofen (ADVIL,MOTRIN) 800 MG tablet Take 1 tablet (800 mg total) by mouth every 8 (eight) hours as needed for mild pain. 03/25/15   Kristen N Ward, DO   BP 107/64 mmHg  Pulse 54  Temp(Src) 98.1 F (36.7 C) (Oral)   Resp 16  Ht  (1.702 m)  Wt 148 lb (67.132 kg)  BMI 23.17 kg/m2  SpO2 100% Physical Exam  Nursing note and vitals reviewed.  40 year old male, resting comfortably and in no acute distress. Vital signs are significant for bradycardia. Oxygen saturation is 100%, which is normal. Head is normocephalic and atraumatic. PERRLA, EOMI. Oropharynx is clear. Neck is nontender and supple without adenopathy or JVD. Back is nontender and there is no CVA tenderness. Lungs are clear without rales, wheezes, or rhonchi. Chest is nontender. Heart has regular rate and rhythm without murmur. Abdomen is soft, flat, nontender without masses or hepatosplenomegaly and peristalsis is normoactive. Extremities have no cyanosis or edema, full range of motion is present. Feet have some slight erythema over the plantar surface and are tender rather diffusely. No warmth and no drainage. No ulceration. Skin is warm and dry without rash. Neurologic: Mental status is normal, cranial nerves are intact, there are no motor or sensory deficits.  ED Course  Procedures (including critical care time) Labs Review .I have personally reviewed and evaluated these images and lab results as part of my medical decision-making.  MDM   Final diagnoses:  Headache, unspecified headache type  Bilateral foot pain    Headache which is consistent with migraine headache. However, it should be noted patient is in absolutely no distress in a room with bright lights. Old records were reviewed confirming that  he was in the ED yesterday. That time, there is a noted that he was wearing a wet socks but he is not having a wet clothing today. Migraine cocktail will be repeated and screening labs obtained.  Patient refused labs although he did accept IV. He had excellent relief of his headache with normal saline, metoclopramide, diphenhydramine, ketorolac, and dexamethasone. He is discharged with prescription for metoclopramide. He is given a  copy of the ED resource guide.  Dione Booze, MD 03/26/15 [phone]

## 2015-03-26 NOTE — ED Notes (Signed)
Dr. Preston Fleeting notified that pt. refused blood test .

## 2015-03-27 ENCOUNTER — Encounter (HOSPITAL_COMMUNITY): Payer: Self-pay | Admitting: Emergency Medicine

## 2015-03-27 ENCOUNTER — Emergency Department (HOSPITAL_COMMUNITY)
Admission: EM | Admit: 2015-03-27 | Discharge: 2015-03-27 | Disposition: A | Discharge status: Home / Self Care | Payer: Self-pay | Attending: Emergency Medicine | Admitting: Emergency Medicine

## 2015-03-27 ENCOUNTER — Emergency Department (HOSPITAL_COMMUNITY): Payer: Self-pay

## 2015-03-27 DIAGNOSIS — Z8659 Personal history of other mental and behavioral disorders: Secondary | ICD-10-CM | POA: Insufficient documentation

## 2015-03-27 DIAGNOSIS — J45909 Unspecified asthma, uncomplicated: Secondary | ICD-10-CM | POA: Insufficient documentation

## 2015-03-27 DIAGNOSIS — F17210 Nicotine dependence, cigarettes, uncomplicated: Secondary | ICD-10-CM | POA: Insufficient documentation

## 2015-03-27 DIAGNOSIS — J069 Acute upper respiratory infection, unspecified: Secondary | ICD-10-CM | POA: Insufficient documentation

## 2015-03-27 DIAGNOSIS — Z590 Homelessness: Secondary | ICD-10-CM | POA: Insufficient documentation

## 2015-03-27 DIAGNOSIS — G43909 Migraine, unspecified, not intractable, without status migrainosus: Secondary | ICD-10-CM | POA: Insufficient documentation

## 2015-03-27 MED ORDER — PREDNISONE 20 MG PO TABS: 40.0000 mg | ORAL_TABLET | Freq: Every day | ORAL | Status: DC

## 2015-03-27 MED ORDER — GUAIFENESIN-DM 100-10 MG/5ML PO SYRP: 5.0000 mL | ORAL_SOLUTION | ORAL | Status: DC | PRN

## 2015-03-27 NOTE — ED Notes (Signed)
Patient here with complaint of upper respiratory symptoms with cough. States production of yellow sputum. Denies fever; normothermic in triage.

## 2015-03-27 NOTE — Discharge Instructions (Signed)
Take ibuprofen or Tylenol for body aches and fever. Take prednisone as prescribed for inflammation until all gone. Use her inhaler 2 puffs every 4 hours. Take Robitussin syrup as needed for cough. Sudafed, nasal saline for congestion. Follow-up with your doctor.    Cough, Adult Coughing is a reflex that clears your throat and your airways. Coughing helps to heal and protect your lungs. It is normal to cough occasionally, but a cough that happens with other symptoms or lasts a long time may be a sign of a condition that needs treatment. A cough may last only 2-3 weeks (acute), or it may last longer than 8 weeks (chronic). CAUSES Coughing is commonly caused by:  Breathing in substances that irritate your lungs.  A viral or bacterial respiratory infection.  Allergies.  Asthma.  Postnasal drip.  Smoking.  Acid backing up from the stomach into the esophagus (gastroesophageal reflux).  Certain medicines.  Chronic lung problems, including COPD (or rarely, lung cancer).  Other medical conditions such as heart failure. HOME CARE INSTRUCTIONS  Pay attention to any changes in your symptoms. Take these actions to help with your discomfort:  Take medicines only as told by your health care provider.  If you were prescribed an antibiotic medicine, take it as told by your health care provider. Do not stop taking the antibiotic even if you start to feel better.  Talk with your health care provider before you take a cough suppressant medicine.  Drink enough fluid to keep your urine clear or pale yellow.  If the air is dry, use a cold steam vaporizer or humidifier in your bedroom or your home to help loosen secretions.  Avoid anything that causes you to cough at work or at home.  If your cough is worse at night, try sleeping in a semi-upright position.  Avoid cigarette smoke. If you smoke, quit smoking. If you need help quitting, ask your health care provider.  Avoid caffeine.  Avoid  alcohol.  Rest as needed. SEEK MEDICAL CARE IF:   You have new symptoms.  You cough up pus.  Your cough does not get better after 2-3 weeks, or your cough gets worse.  You cannot control your cough with suppressant medicines and you are losing sleep.  You develop pain that is getting worse or pain that is not controlled with pain medicines.  You have a fever.  You have unexplained weight loss.  You have night sweats. SEEK IMMEDIATE MEDICAL CARE IF:  You cough up blood.  You have difficulty breathing.  Your heartbeat is very fast.   This information is not intended to replace advice given to you by your health care provider. Make sure you discuss any questions you have with your health care provider.   Document Released: 07/17/2010 Document Revised: 10/09/2014 Document Reviewed: 03/27/2014 Elsevier Interactive Patient Education 2016 Elsevier Inc.  Upper Respiratory Infection, Adult Most upper respiratory infections (URIs) are a viral infection of the air passages leading to the lungs. A URI affects the nose, throat, and upper air passages. The most common type of URI is nasopharyngitis and is typically referred to as "the common cold." URIs run their course and usually go away on their own. Most of the time, a URI does not require medical attention, but sometimes a bacterial infection in the upper airways can follow a viral infection. This is called a secondary infection. Sinus and middle ear infections are common types of secondary upper respiratory infections. Bacterial pneumonia can also complicate a URI.  A URI can worsen asthma and chronic obstructive pulmonary disease (COPD). Sometimes, these complications can require emergency medical care and may be life threatening.  CAUSES Almost all URIs are caused by viruses. A virus is a type of germ and can spread from one person to another.  RISKS FACTORS You may be at risk for a URI if:   You smoke.   You have chronic heart  or lung disease.  You have a weakened defense (immune) system.   You are very young or very old.   You have nasal allergies or asthma.  You work in crowded or poorly ventilated areas.  You work in health care facilities or schools. SIGNS AND SYMPTOMS  Symptoms typically develop 2-3 days after you come in contact with a cold virus. Most viral URIs last 7-10 days. However, viral URIs from the influenza virus (flu virus) can last 14-18 days and are typically more severe. Symptoms may include:   Runny or stuffy (congested) nose.   Sneezing.   Cough.   Sore throat.   Headache.   Fatigue.   Fever.   Loss of appetite.   Pain in your forehead, behind your eyes, and over your cheekbones (sinus pain).  Muscle aches.  DIAGNOSIS  Your health care provider may diagnose a URI by:  Physical exam.  Tests to check that your symptoms are not due to another condition such as:  Strep throat.  Sinusitis.  Pneumonia.  Asthma. TREATMENT  A URI goes away on its own with time. It cannot be cured with medicines, but medicines may be prescribed or recommended to relieve symptoms. Medicines may help:  Reduce your fever.  Reduce your cough.  Relieve nasal congestion. HOME CARE INSTRUCTIONS   Take medicines only as directed by your health care provider.   Gargle warm saltwater or take cough drops to comfort your throat as directed by your health care provider.  Use a warm mist humidifier or inhale steam from a shower to increase air moisture. This may make it easier to breathe.  Drink enough fluid to keep your urine clear or pale yellow.   Eat soups and other clear broths and maintain good nutrition.   Rest as needed.   Return to work when your temperature has returned to normal or as your health care provider advises. You may need to stay home longer to avoid infecting others. You can also use a face mask and careful hand washing to prevent spread of the  virus.  Increase the usage of your inhaler if you have asthma.   Do not use any tobacco products, including cigarettes, chewing tobacco, or electronic cigarettes. If you need help quitting, ask your health care provider. PREVENTION  The best way to protect yourself from getting a cold is to practice good hygiene.   Avoid oral or hand contact with people with cold symptoms.   Wash your hands often if contact occurs.  There is no clear evidence that vitamin C, vitamin E, echinacea, or exercise reduces the chance of developing a cold. However, it is always recommended to get plenty of rest, exercise, and practice good nutrition.  SEEK MEDICAL CARE IF:   You are getting worse rather than better.   Your symptoms are not controlled by medicine.   You have chills.  You have worsening shortness of breath.  You have brown or red mucus.  You have yellow or brown nasal discharge.  You have pain in your face, especially when you bend forward.  You have a fever.  You have swollen neck glands.  You have pain while swallowing.  You have white areas in the back of your throat. SEEK IMMEDIATE MEDICAL CARE IF:   You have severe or persistent:  Headache.  Ear pain.  Sinus pain.  Chest pain.  You have chronic lung disease and any of the following:  Wheezing.  Prolonged cough.  Coughing up blood.  A change in your usual mucus.  You have a stiff neck.  You have changes in your:  Vision.  Hearing.  Thinking.  Mood. MAKE SURE YOU:   Understand these instructions.  Will watch your condition.  Will get help right away if you are not doing well or get worse.   This information is not intended to replace advice given to you by your health care provider. Make sure you discuss any questions you have with your health care provider.   Document Released: 07/14/2000 Document Revised: 06/04/2014 Document Reviewed: 04/25/2013 Elsevier Interactive Patient Education  2016 [employer].  Emergency Department Resource Guide 1) Find a Doctor and Pay Out of Pocket Although you won't have to find out who is covered by your insurance plan, it is a good idea to ask around and get recommendations. You will then need to call the office and see if the doctor you have chosen will accept you as a new patient and what types of options they offer for patients who are self-pay. Some doctors offer discounts or will set up payment plans for their patients who do not have insurance, but you will need to ask so you aren't surprised when you get to your appointment.  2) Contact Your Local Health Department Not all health departments have doctors that can see patients for sick visits, but many do, so it is worth a call to see if yours does. If you don't know where your local health department is, you can check in your phone book. The CDC also has a tool to help you locate your state's health department, and many state websites also have listings of all of their local health departments.  3) Find a Walk-in Clinic If your illness is not likely to be very severe or complicated, you may want to try a walk in clinic. These are popping up all over the country in pharmacies, drugstores, and shopping centers. They're usually staffed by nurse practitioners or physician assistants that have been trained to treat common illnesses and complaints. They're usually fairly quick and inexpensive. However, if you have serious medical issues or chronic medical problems, these are probably not your best option.  No Primary Care Doctor: - Call Health Connect at  [phone] - they can help you locate a primary care doctor that  accepts your insurance, provides certain services, etc. - Physician Referral Service- [phone]  Chronic Pain Problems: Organization         Address  Phone   Notes  [facility]  [phone] Patients need to be referred by their primary care doctor.    Medication Assistance: Organization         Address  Phone   Notes  [facility] Medication [facility] [address] [place], [place] [address] [phone] --Must be a resident of [facility] -- Must have NO insurance coverage whatsoever (no Medicaid/ Medicare, etc.) -- The pt. MUST have a primary care doctor that directs their care regularly and follows them in the community   [facility]  [phone]   [employer]  [phone]    Agencies that provide inexpensive medical care: Organization         Address  Phone   Notes  [facility]  [phone]   [facility]    [phone]   [facility] [address] [place], [place] [address] [phone]   [facility] [address] [place], [place] [phone]   Planned Parenthood    [phone]   [facility]    [phone]   [facility]  [address], [place] Phone:  [phone], Fax:  [phone] Hours of Operation:  9 am - 6 pm, M-F.  Also accepts Medicaid/Medicare and self-pay.  [facility]  [address], [place] Phone: [phone], Fax: [phone]. Hours of Operation:  8:30 am - 5:30 pm, M-F.  Also accepts Medicaid and self-pay.  [facility] [place] [address], [place] Phone: [phone]   [facility] [address] Natasha Bence [place], [place] [phone], Ext. [phone] Mondays & Thursdays: 7-9 AM.  First 15 patients are seen on a first come, first serve basis.    Medicaid-accepting [facility] Providers:  Organization         Address  Phone   Notes  [facility] [place] [address], [place] [phone] Also accepts self-pay patients.  [facility] [address] Laurell Josephs [place], [place]  [phone]   [facility] [address], [place] [phone]   [facility] [address], [place] [phone]   Renaye Rakers [address], [place]   [phone] Only accepts [place] Access [place] patients after they have their name applied to their card.   Self-Pay (no insurance) in [facility]:  Organization         Address  Phone   Notes  Sickle Cell Patients, [facility] [address] [place], [place] [phone]   [facility] [address] [place], [place] [phone]   [facility]  [address], [place] [phone]   [facility]/Dr. Osei-Bonsu  [address], [place] or [address], [place] [phone] Phone number for both [place] and [place] locations is the same.  [facility]gent [facility] and [facility] [address] [place] [phone]   [facility] [address] [place] or [address] [phone]   [facility] [address] [place] [phone], phone; [phone], fax Sees patients 1st and 3rd Saturday of every month.  Must not qualify for public or private insurance (i.e. Medicaid, Medicare, [place] Health Choice, Veterans' Benefits)  Household income should be no more than 200% of the poverty level The clinic cannot treat you if you are pregnant or think you are pregnant  Sexually transmitted diseases are not treated at the clinic.    Dental Care: Organization         Address  Phone  Notes  [facility] [address] [place], [place] [phone] Accepts children up to age 41 who are enrolled in [place] or [place] Health Choice; pregnant women with a  Medicaid card; and children who have applied for Medicaid or [place] Health Choice, but were declined, whose parents can pay a reduced fee at time of service.    [facility] Department of [facility]  [address], [place] [phone] Accepts children up to age 55 who are enrolled in [place] or [place] Health Choice; pregnant women with a Medicaid card; and children who have applied for Medicaid or [place] Health Choice, but were declined, whose parents can pay a reduced fee at time of service.  [facility]  [address] [place], [place] [phone] Patients are seen by appointment only. Walk-ins are not accepted. [facility] will see patients 51 years of age and older. Monday - Tuesday (8am-5pm) Most Wednesdays (8:30-5pm) $30 per visit, cash only  [facility] Dental Access [facility]  [address], [facility] [phone] Patients are seen by appointment only. Walk-ins are not accepted. [facility] will see patients 70 years of age and older. One Wednesday Evening (Monthly: Volunteer Based).  $30 per visit, cash only  [employer]  [phone] for adults; Children under age 59, call Graduate Pediatric Dentistry at [phone]. Children aged 6-14, please call [phone] to request a pediatric application.  Dental services are provided in all areas of dental care including fillings, crowns and bridges, complete and partial dentures, implants, gum treatment, root canals, and extractions. Preventive care is also provided. Treatment is provided to both adults and children. Patients are selected via a lottery and there is often a waiting list.   [facility] [address] [place]  [phone] [email or web address]   [facility] [address] [place], [place] [phone], Ext. [phone] Second and Fourth Thursday of each month, opens at 6:30 AM; Clinic ends at 9 AM.  Patients are seen on a first-come first-served basis, and a limited number are seen during each clinic.   [facility]  [address] Ether Griffins [place], [place] [phone]   Eligibility Requirements You must have lived in [place], [place], or [place] counties for at least the last three months.   You cannot be eligible for state or federal sponsored [place], including [employer], [place], or [employer].   You generally cannot be eligible for healthcare insurance through your employer.    How to apply: Eligibility screenings are held every Tuesday and Wednesday afternoon from 1:00 pm until 4:00 pm. You do not need an appointment for the interview!  [facility] [address], [place], [place] [phone]   [facility] Health Department  [phone]   [facility] Health Department  [phone]   [facility] Health Department  [phone]    Behavioral Health Resources in the Community: Intensive Outpatient Programs Organization         Address  Phone  Notes  [facility] Services [address], [place], [place] [phone]   [facility] Outpatient [address] [place], [place] [phone]   [facility] [address] [place], [place]  [phone]   [facility] Mental Health [address],  [place], [place] [phone] or [phone]   Substance Abuse Resources Organization         Address  Phone  Notes  [facility]  [phone]   [facility]  [phone]   [facility]  [phone]   [facility]  ([phone]   Residential & Outpatient Substance Abuse Program  [phone]   Psychological Services Organization         Address  Phone  Notes  [facility] Health  [phone]   [facility] Services  [phone]   [facility] [address] [place] [phone] or [phone]    Mobile Crisis Teams Organization         Address  Phone  Notes  [facility], Mobile Crisis Care Unit  [phone]   [facility]  [address] [place], [place] [phone]   Doristine Locks [address] [place] [phone]    Self-Help/Support Groups Organization         Address  Phone             Notes  [facility] [place] - variety of support groups  336- I7437963 Call for more information  Narcotics Anonymous (NA), [facility] [address], [employer] [place]  2 meetings at this location   Statistician         Address  Phone  Notes  [facility] [address],    [place]  [phone]   [facility]  [address] [place] [address], [place], [place] [phone]   [facility] [address] [place], [place] [phone] Admissions: 8am-3pm M-F  [facility] [address],    [place], [place] [phone]   [facility] [address] [place], [place], [place] [phone]   [facility] [address]  [place], [place] [phone]   [facility] - Intensive Outpatient [address] Laurell Josephs [address], [place], [place] [phone]   [facility] ([facility]iction [facility] [address] [place]  [place], [place] [phone] or [phone]   [facility] ([facility]) [address], [place], [place] [phone] Accepts Medicaid  [facility] [place] [address],  [place] [phone] Substance Abuse/Addiction Treatment   [facility] Organization         Address  Phone  Notes  [facility]  [phone]   Angie Fava, PhD [address] Ervin Knack [place], [place]   [phone] or [phone]   [facility] Behavioral   [address] [place], [place] [phone]   [facility] [address] [place], [place] [phone] Insurance/Medicaid/sponsorship through [facility] [place] [facility] and Families [address]                                    [place], [place] [phone]  Therapy/tele-psych/case  [facility] [address], [place] [phone]    Dr. Lolly Mustache  [phone]   [facility]  [employer] [facility] Dept. 1) [address], [place] 2) [address], [place] 3)  [address], [place] [phone]  [phone]   [facility] Child Abuse Hotline 608-635-9794 or [phone] (After Hours)

## 2015-03-27 NOTE — ED Provider Notes (Signed)
CSN: [id number]     Arrival date & time 03/27/15  2009 History  By signing my name below, I, Tanda Rockers, attest that this documentation has been prepared under the direction and in the presence of Leanne Sisler, PA-C.  Electronically Signed: Tanda Rockers, ED Scribe. 03/27/2015. 9:54 PM.   Chief Complaint  Patient presents with  . Cough  . URI   The history is provided by the patient. No language interpreter was used.     HPI Comments: Dustin Norris is a 40 y.o. male who presents to the Emergency Department complaining of gradual onset, constant, worsening, cough x 3-4 days. Pt also complains of a sore throat and nasal congestion. He has been using Halls lozenges without relief. Pt has hx of asthma and does have an inhaler but states he left it at work and has been unable to use it. Denies rhinorrhea, nausea, vomiting, diarrhea, fever, or any other associated symptoms. He is a current every day smoker.    Past Medical History  Diagnosis Date  . Anxiety   . Asthma   . Homelessness   . Migraine    History reviewed. No pertinent past surgical history. History reviewed. No pertinent family history. Social History  Substance Use Topics  . Smoking status: Current Every Day Smoker -- 0.00 packs/day for 0 years    Types: Cigarettes  . Smokeless tobacco: None  . Alcohol Use: No    Review of Systems  Constitutional: Negative for fever.  HENT: Positive for congestion and sore throat. Negative for rhinorrhea.   Respiratory: Positive for cough.   Gastrointestinal: Negative for nausea and vomiting.   Allergies  Review of patient's allergies indicates no known allergies.  Home Medications   Prior to Admission medications   Medication Sig Start Date End Date Taking? Authorizing Provider  ibuprofen (ADVIL,MOTRIN) 800 MG tablet Take 1 tablet (800 mg total) by mouth every 8 (eight) hours as needed for mild pain. 03/25/15   Kristen N Ward, DO  metoCLOPramide (REGLAN) 10 MG  tablet Take 1 tablet (10 mg total) by mouth every 6 (six) hours as needed for nausea (or headache). 03/26/15   Dione Booze, MD   BP 132/85 mmHg  Pulse 65  Temp(Src) 98.6 F (37 C) (Oral)  Resp 16  Ht  (1.702 m)  Wt 150 lb (68.04 kg)  BMI 23.49 kg/m2  SpO2 99%   Physical Exam  Constitutional: He is oriented to person, place, and time. He appears well-developed and well-nourished. No distress.  HENT:  Head: Normocephalic and atraumatic.  Right Ear: Tympanic membrane, external ear and ear canal normal.  Left Ear: Tympanic membrane, external ear and ear canal normal.  Nose: Mucosal edema and rhinorrhea present. Right sinus exhibits no maxillary sinus tenderness and no frontal sinus tenderness. Left sinus exhibits no maxillary sinus tenderness and no frontal sinus tenderness.  Mouth/Throat: Uvula is midline, oropharynx is clear and moist and mucous membranes are normal.  Eyes: Conjunctivae and EOM are normal.  Neck: Neck supple. No tracheal deviation present.  Cardiovascular: Normal rate, regular rhythm, normal heart sounds and intact distal pulses.   Pulmonary/Chest: Effort normal and breath sounds normal. No respiratory distress. He has no wheezes. He has no rales.  Musculoskeletal: Normal range of motion.  Neurological: He is alert and oriented to person, place, and time.  Skin: Skin is warm and dry.  Psychiatric: He has a normal mood and affect. His behavior is normal.  Nursing note and vitals reviewed.   ED  Course  Procedures (including critical care time)  DIAGNOSTIC STUDIES: Oxygen Saturation is 99% on RA, normal by my interpretation.    COORDINATION OF CARE: 9:53 PM-Discussed treatment plan which includes Rx Prednisone with pt at bedside and pt agreed to plan.   Labs Review Labs Reviewed - No data to display  Imaging Review Dg Chest 2 View  03/27/2015  CLINICAL DATA:  Cough and nasal congestion. EXAM: CHEST  2 VIEW COMPARISON:  02/22/2015 FINDINGS: The heart size  and mediastinal contours are within normal limits. Both lungs are clear. The visualized skeletal structures are unremarkable. IMPRESSION: No active cardiopulmonary disease. Electronically Signed   By: Kennith Center M.D.   On: 03/27/2015 21:23   I have personally reviewed and evaluated these images as part of my medical decision-making.   EKG Interpretation None      MDM   Final diagnoses:  URI (upper respiratory infection)   Patient with URI symptoms, cough, congestion which she states started to 3 days ago. Patient was just seen for headache and foot pain yesterday with no mention of the symptoms. He states that most of bothering him is his cough. Chest x-ray was ordered by triage and is normal. Patient states that time he is wheezing. He has inhaler at home, will add prednisone course. His vital signs are normal emergency department. He is nontoxic. Afebrile. He is not hypoxic or tachypnea. He is stable for discharge home with symptomatic treatment. Follow with primary care doctor. Most likely viral URI.  Filed Vitals:   03/27/15 2025  BP: 132/85  Pulse: 65  Temp: 98.6 F (37 C)  TempSrc: Oral  Resp: 16  Height:  (1.702 m)  Weight: 68.04 kg  SpO2: 99%      Jaynie Crumble, PA-C 03/27/15 2210  Nelva Nay, MD 03/31/15 1345

## 2015-03-31 ENCOUNTER — Encounter (HOSPITAL_COMMUNITY): Payer: Self-pay | Admitting: Emergency Medicine

## 2015-03-31 DIAGNOSIS — J45909 Unspecified asthma, uncomplicated: Secondary | ICD-10-CM | POA: Insufficient documentation

## 2015-03-31 DIAGNOSIS — F17210 Nicotine dependence, cigarettes, uncomplicated: Secondary | ICD-10-CM | POA: Insufficient documentation

## 2015-03-31 DIAGNOSIS — R51 Headache: Secondary | ICD-10-CM | POA: Insufficient documentation

## 2015-03-31 DIAGNOSIS — Z590 Homelessness: Secondary | ICD-10-CM | POA: Insufficient documentation

## 2015-03-31 DIAGNOSIS — Z8659 Personal history of other mental and behavioral disorders: Secondary | ICD-10-CM | POA: Insufficient documentation

## 2015-03-31 DIAGNOSIS — H53149 Visual discomfort, unspecified: Secondary | ICD-10-CM | POA: Insufficient documentation

## 2015-03-31 DIAGNOSIS — Z79899 Other long term (current) drug therapy: Secondary | ICD-10-CM | POA: Insufficient documentation

## 2015-03-31 DIAGNOSIS — R0789 Other chest pain: Secondary | ICD-10-CM | POA: Insufficient documentation

## 2015-03-31 NOTE — ED Notes (Signed)
Patient arrives with complaint of headache and shortness of breath. States onset this AM. Denies fever and wheezing. Endorses fatigue.

## 2015-04-01 ENCOUNTER — Emergency Department (HOSPITAL_COMMUNITY)
Admission: EM | Admit: 2015-04-01 | Discharge: 2015-04-01 | Disposition: A | Discharge status: Home / Self Care | Payer: Self-pay | Attending: Emergency Medicine | Admitting: Emergency Medicine

## 2015-04-01 ENCOUNTER — Emergency Department (HOSPITAL_COMMUNITY)
Admission: EM | Admit: 2015-04-01 | Discharge: 2015-04-02 | Disposition: A | Discharge status: Home / Self Care | Payer: Self-pay | Attending: Emergency Medicine | Admitting: Emergency Medicine

## 2015-04-01 ENCOUNTER — Emergency Department (HOSPITAL_COMMUNITY): Payer: Self-pay

## 2015-04-01 ENCOUNTER — Encounter (HOSPITAL_COMMUNITY): Payer: Self-pay

## 2015-04-01 DIAGNOSIS — Z8679 Personal history of other diseases of the circulatory system: Secondary | ICD-10-CM | POA: Insufficient documentation

## 2015-04-01 DIAGNOSIS — R519 Headache, unspecified: Secondary | ICD-10-CM

## 2015-04-01 DIAGNOSIS — Z8659 Personal history of other mental and behavioral disorders: Secondary | ICD-10-CM | POA: Insufficient documentation

## 2015-04-01 DIAGNOSIS — R51 Headache: Secondary | ICD-10-CM

## 2015-04-01 DIAGNOSIS — F17210 Nicotine dependence, cigarettes, uncomplicated: Secondary | ICD-10-CM | POA: Insufficient documentation

## 2015-04-01 DIAGNOSIS — Z79899 Other long term (current) drug therapy: Secondary | ICD-10-CM | POA: Insufficient documentation

## 2015-04-01 DIAGNOSIS — Z590 Homelessness: Secondary | ICD-10-CM | POA: Insufficient documentation

## 2015-04-01 DIAGNOSIS — R0789 Other chest pain: Secondary | ICD-10-CM

## 2015-04-01 DIAGNOSIS — J45909 Unspecified asthma, uncomplicated: Secondary | ICD-10-CM | POA: Insufficient documentation

## 2015-04-01 MED ORDER — KETOROLAC TROMETHAMINE 30 MG/ML IJ SOLN: 30.0000 mg | Freq: Once | INTRAMUSCULAR | Status: DC

## 2015-04-01 MED ORDER — BUTALBITAL-APAP-CAFFEINE 50-325-40 MG PO TABS
1.0000 | ORAL_TABLET | Freq: Once | ORAL | Status: AC
  Administered 2015-04-01: 1 via ORAL
  Filled 2015-04-01: qty 1

## 2015-04-01 NOTE — ED Notes (Signed)
After pt was DC, refused to get out of the bed. Asked pt multiple times to get dressed. Pt refused. Security was called and at bedside to escort pt out

## 2015-04-01 NOTE — Discharge Instructions (Signed)
Nonspecific Chest Pain  °Chest pain can be caused by many different conditions. There is always a chance that your pain could be related to something serious, such as a heart attack or a blood clot in your lungs. Chest pain can also be caused by conditions that are not life-threatening. If you have chest pain, it is very important to follow up with your health care provider. °CAUSES  °Chest pain can be caused by: °· Heartburn. °· Pneumonia or bronchitis. °· Anxiety or stress. °· Inflammation around your heart (pericarditis) or lung (pleuritis or pleurisy). °· A blood clot in your lung. °· A collapsed lung (pneumothorax). It can develop suddenly on its own (spontaneous pneumothorax) or from trauma to the chest. °· Shingles infection (varicella-zoster virus). °· Heart attack. °· Damage to the bones, muscles, and cartilage that make up your chest wall. This can include: °¨ Bruised bones due to injury. °¨ Strained muscles or cartilage due to frequent or repeated coughing or overwork. °¨ Fracture to one or more ribs. °¨ Sore cartilage due to inflammation (costochondritis). °RISK FACTORS  °Risk factors for chest pain may include: °· Activities that increase your risk for trauma or injury to your chest. °· Respiratory infections or conditions that cause frequent coughing. °· Medical conditions or overeating that can cause heartburn. °· Heart disease or family history of heart disease. °· Conditions or health behaviors that increase your risk of developing a blood clot. °· Having had chicken pox (varicella zoster). °SIGNS AND SYMPTOMS °Chest pain can feel like: °· Burning or tingling on the surface of your chest or deep in your chest. °· Crushing, pressure, aching, or squeezing pain. °· Dull or sharp pain that is worse when you move, cough, or take a deep breath. °· Pain that is also felt in your back, neck, shoulder, or arm, or pain that spreads to any of these areas. °Your chest pain may come and go, or it may stay  constant. °DIAGNOSIS °Lab tests or other studies may be needed to find the cause of your pain. Your health care provider may have you take a test called an ambulatory ECG (electrocardiogram). An ECG records your heartbeat patterns at the time the test is performed. You may also have other tests, such as: °· Transthoracic echocardiogram (TTE). During echocardiography, sound waves are used to create a picture of all of the heart structures and to look at how blood flows through your heart. °· Transesophageal echocardiogram (TEE). This is a more advanced imaging test that obtains images from inside your body. It allows your health care provider to see your heart in finer detail. °· Cardiac monitoring. This allows your health care provider to monitor your heart rate and rhythm in real time. °· Holter monitor. This is a portable device that records your heartbeat and can help to diagnose abnormal heartbeats. It allows your health care provider to track your heart activity for several days, if needed. °· Stress tests. These can be done through exercise or by taking medicine that makes your heart beat more quickly. °· Blood tests. °· Imaging tests. °TREATMENT  °Your treatment depends on what is causing your chest pain. Treatment may include: °· Medicines. These may include: °¨ Acid blockers for heartburn. °¨ Anti-inflammatory medicine. °¨ Pain medicine for inflammatory conditions. °¨ Antibiotic medicine, if an infection is present. °¨ Medicines to dissolve blood clots. °¨ Medicines to treat coronary artery disease. °· Supportive care for conditions that do not require medicines. This may include: °¨ Resting. °¨ Applying heat   or cold packs to injured areas. °¨ Limiting activities until pain decreases. °HOME CARE INSTRUCTIONS °· If you were prescribed an antibiotic medicine, finish it all even if you start to feel better. °· Avoid any activities that bring on chest pain. °· Do not use any tobacco products, including  cigarettes, chewing tobacco, or electronic cigarettes. If you need help quitting, ask your health care provider. °· Do not drink alcohol. °· Take medicines only as directed by your health care provider. °· Keep all follow-up visits as directed by your health care provider. This is important. This includes any further testing if your chest pain does not go away. °· If heartburn is the cause for your chest pain, you may be told to keep your head raised (elevated) while sleeping. This reduces the chance that acid will go from your stomach into your esophagus. °· Make lifestyle changes as directed by your health care provider. These may include: °¨ Getting regular exercise. Ask your health care provider to suggest some activities that are safe for you. °¨ Eating a heart-healthy diet. A registered dietitian can help you to learn healthy eating options. °¨ Maintaining a healthy weight. °¨ Managing diabetes, if necessary. °¨ Reducing stress. °SEEK MEDICAL CARE IF: °· Your chest pain does not go away after treatment. °· You have a rash with blisters on your chest. °· You have a fever. °SEEK IMMEDIATE MEDICAL CARE IF:  °· Your chest pain is worse. °· You have an increasing cough, or you cough up blood. °· You have severe abdominal pain. °· You have severe weakness. °· You faint. °· You have chills. °· You have sudden, unexplained chest discomfort. °· You have sudden, unexplained discomfort in your arms, back, neck, or jaw. °· You have shortness of breath at any time. °· You suddenly start to sweat, or your skin gets clammy. °· You feel nauseous or you vomit. °· You suddenly feel light-headed or dizzy. °· Your heart begins to beat quickly, or it feels like it is skipping beats. °These symptoms may represent a serious problem that is an emergency. Do not wait to see if the symptoms will go away. Get medical help right away. Call your local emergency services (911 in the [place] Do not drive yourself to the hospital. °  °This  information is not intended to replace advice given to you by your health care provider. Make sure you discuss any questions you have with your health care provider. °  °Document Released: 10/28/2004 Document Revised: 02/08/2014 Document Reviewed: 08/24/2013 °Elsevier Interactive Patient Education ©2016 [employer] ° °

## 2015-04-01 NOTE — ED Notes (Signed)
PA at bedside.

## 2015-04-01 NOTE — ED Notes (Signed)
Pt exposing genitalia to RN as she entered room to obtain blood pressure.

## 2015-04-01 NOTE — ED Notes (Signed)
Pt complains of a migraine for two days, no relief with tylenol

## 2015-04-01 NOTE — ED Provider Notes (Signed)
CSN: [id number]     Arrival date & time 03/31/15  2102 History   By signing my name below, I, Arlan Organ, attest that this documentation has been prepared under the direction and in the presence of Shon Baton, MD.  Electronically Signed: Arlan Organ, ED Scribe. 04/01/2015. 1:57 AM.   Chief Complaint  Patient presents with  . Headache  . Asthma   The history is provided by the patient. No language interpreter was used.    HPI Comments: Dustin Norris is a 40 y.o. male with a PMHx of asthma and anxiety who presents to the Emergency Department complaining of constant, ongoing chest pain x 1 day. Pain is described as sharp and currently rates 7/10. No aggravating or alleviating factors at this time. Pt also reports a throbbing HA rated 8/10 with associated photophobia. No OTC medications or home remedies attempted prior to arrival. No recent fever, chills, nausea, vomiting, or abdominal pain. Pt was last evaluated in the Emergency Department for cough and URI on 03/27/15. At that time, pt was sent home with a course of Prednisone.  PCP: No PCP Per Patient    Past Medical History  Diagnosis Date  . Anxiety   . Asthma   . Homelessness   . Migraine    History reviewed. No pertinent past surgical history. History reviewed. No pertinent family history. Social History  Substance Use Topics  . Smoking status: Current Every Day Smoker -- 0.00 packs/day for 0 years    Types: Cigarettes  . Smokeless tobacco: None  . Alcohol Use: No    Review of Systems  Constitutional: Negative for fever and chills.  Respiratory: Negative for cough and shortness of breath.   Cardiovascular: Positive for chest pain.  Gastrointestinal: Negative for nausea, vomiting, abdominal pain and diarrhea.  Neurological: Positive for headaches.  Psychiatric/Behavioral: Negative for confusion.  All other systems reviewed and are negative.     Allergies  Review of patient's allergies indicates no known  allergies.  Home Medications   Prior to Admission medications   Medication Sig Start Date End Date Taking? Authorizing Provider  albuterol (PROVENTIL HFA;VENTOLIN HFA) 108 (90 Base) MCG/ACT inhaler Inhale 2 puffs into the lungs every 6 (six) hours as needed for wheezing or shortness of breath.   Yes Historical Provider, MD  guaiFENesin-dextromethorphan (ROBITUSSIN DM) 100-10 MG/5ML syrup Take 5 mLs by mouth every 4 (four) hours as needed for cough. 03/27/15  Yes Tatyana Kirichenko, PA-C  metoCLOPramide (REGLAN) 10 MG tablet Take 1 tablet (10 mg total) by mouth every 6 (six) hours as needed for nausea (or headache). 03/26/15  Yes Dione Booze, MD  butalbital-acetaminophen-caffeine (FIORICET) [phone] MG tablet Take 1-2 tablets by mouth every 6 (six) hours as needed for headache. 04/02/15 04/01/16  Fayrene Helper, PA-C  ibuprofen (ADVIL,MOTRIN) 800 MG tablet Take 1 tablet (800 mg total) by mouth every 8 (eight) hours as needed for mild pain. 03/25/15   Kristen N Ward, DO  predniSONE (DELTASONE) 20 MG tablet Take 2 tablets (40 mg total) by mouth daily. Patient not taking: Reported on 04/01/2015 03/27/15   Jaynie Crumble, PA-C   Triage Vitals: BP 120/68 mmHg  Pulse 68  Temp(Src) 98.7 F (37.1 C) (Oral)  Resp 18  Ht  (1.702 m)  Wt 147 lb 8 oz (66.906 kg)  BMI 23.10 kg/m2  SpO2 98%   Physical Exam  Constitutional: He is oriented to person, place, and time. He appears well-developed and well-nourished. No distress.  HENT:  Head: Normocephalic  and atraumatic.  Neck: Normal range of motion.  Cardiovascular: Normal rate, regular rhythm and normal heart sounds.   No murmur heard. Pulmonary/Chest: Effort normal and breath sounds normal. No respiratory distress. He has no wheezes. He exhibits tenderness.  Tenderness palpation right chest wall  Abdominal: Soft. Bowel sounds are normal. There is no tenderness. There is no rebound.  Musculoskeletal: He exhibits no edema.  Neurological: He is alert and  oriented to person, place, and time.  Cranial nerves II through XII intact, 5 out of 5 strength in all 4 extremities  Skin: Skin is warm and dry.  Psychiatric: He has a normal mood and affect.  Nursing note and vitals reviewed.   ED Course  Procedures (including critical care time)  DIAGNOSTIC STUDIES: Oxygen Saturation is 99% on RA, Normal by my interpretation.    COORDINATION OF CARE: 1:48 AM- Will give Toradol. Will order CXR, EKG, and blood work. Discussed treatment plan with pt at bedside and pt agreed to plan.     Labs Review Labs Reviewed - No data to display  Imaging Review Dg Chest 2 View  04/01/2015  CLINICAL DATA:  40 year old male with shortness of breath and right-sided chest pain EXAM: CHEST  2 VIEW COMPARISON:  Radiograph dated 03/27/2015 FINDINGS: The heart size and mediastinal contours are within normal limits. Both lungs are clear. The visualized skeletal structures are unremarkable. IMPRESSION: No active cardiopulmonary disease. Electronically Signed   By: Elgie Collard M.D.   On: 04/01/2015 02:39   I have personally reviewed and evaluated these images and lab results as part of my medical decision-making.   EKG Interpretation   Date/Time:  Tuesday April 01 2015 02:43:31 EST Ventricular Rate:  66 PR Interval:  137 QRS Duration: 86 QT Interval:  427 QTC Calculation: 447 R Axis:   81 Text Interpretation:  Sinus rhythm RSR' in V1 or V2, probably normal  variant Consider left ventricular hypertrophy Similar to prior Confirmed  by HORTON  MD, COURTNEY ([id number]) on 04/01/2015 4:04:32 AM Also confirmed by  HORTON  MD, COURTNEY ([id number]), editor [place]  CCT, BEVERLY ([id number])  on  04/01/2015 7:09:02 AM      MDM   Final diagnoses:  Other chest pain  Acute nonintractable headache, unspecified headache type    Patient presents with multiple complaints including chest pain and headache. He is nontoxic. Exam is reassuring. He has reproducible chest wall pain.  X-ray and EKG are reassuring. He is refusing lab work. He was given Toradol for his headache. On multiple rechecks, he is resting comfortably and without complaint. Patient discharged in no acute distress.  After history, exam, and medical workup I feel the patient has been appropriately medically screened and is safe for discharge home. Pertinent diagnoses were discussed with the patient. Patient was given return precautions.  I personally performed the services described in this documentation, which was scribed in my presence. The recorded information has been reviewed and is accurate.   Shon Baton, MD 04/02/15 [phone]

## 2015-04-01 NOTE — ED Notes (Signed)
Pt reports that he is homeless and not from here and is trying to get in shelters and is spending his money for hotel rooms, he needs help and would like to get back to [place]. He is scared and hungry

## 2015-04-01 NOTE — ED Provider Notes (Signed)
CSN: [id number]     Arrival date & time 04/01/15  2054 History   First MD Initiated Contact with Patient 04/01/15 2318     Chief Complaint  Patient presents with  . Homeless  . Migraine     (Consider location/radiation/quality/duration/timing/severity/associated sxs/prior Treatment) HPI   40 year old homeless male with hx of migraine presents complaining of headache. Patient states he has recurrent headache and for the past 3 days he has as throbbing frontal headache worsening with standing outside for prolonged period of time. There is no associated fever, neck stiffness, URI symptoms, focal numbness or weakness, or rash. He was seen earlier today for the same headache and subsequently discharge after receiving Toradol shot. He mentioned that his primary reason that he is here is because he has not happened established shelter to go to and he is tired of standing outside and being homeless. He plans on going back to [facility] where his family is at. He has no other complaint.  Past Medical History  Diagnosis Date  . Anxiety   . Asthma   . Homelessness   . Migraine    History reviewed. No pertinent past surgical history. History reviewed. No pertinent family history. Social History  Substance Use Topics  . Smoking status: Current Every Day Smoker -- 0.00 packs/day for 0 years    Types: Cigarettes  . Smokeless tobacco: None  . Alcohol Use: No    Review of Systems  Constitutional: Negative for fever.  Skin: Negative for rash.  Neurological: Positive for headaches. Negative for numbness.      Allergies  Review of patient's allergies indicates no known allergies.  Home Medications   Prior to Admission medications   Medication Sig Start Date End Date Taking? Authorizing Provider  albuterol (PROVENTIL HFA;VENTOLIN HFA) 108 (90 Base) MCG/ACT inhaler Inhale 2 puffs into the lungs every 6 (six) hours as needed for wheezing or shortness of breath.    Historical Provider,  MD  guaiFENesin-dextromethorphan (ROBITUSSIN DM) 100-10 MG/5ML syrup Take 5 mLs by mouth every 4 (four) hours as needed for cough. 03/27/15   Tatyana Kirichenko, PA-C  ibuprofen (ADVIL,MOTRIN) 800 MG tablet Take 1 tablet (800 mg total) by mouth every 8 (eight) hours as needed for mild pain. 03/25/15   Kristen N Ward, DO  metoCLOPramide (REGLAN) 10 MG tablet Take 1 tablet (10 mg total) by mouth every 6 (six) hours as needed for nausea (or headache). 03/26/15   Dione Booze, MD  predniSONE (DELTASONE) 20 MG tablet Take 2 tablets (40 mg total) by mouth daily. Patient not taking: Reported on 04/01/2015 03/27/15   Tatyana Kirichenko, PA-C   BP 132/82 mmHg  Pulse 90  Temp(Src) 98.4 F (36.9 C) (Oral)  Resp 18  SpO2 100% Physical Exam  Constitutional: He appears well-developed and well-nourished. No distress.  African-American male, well-appearing, eating chips and in no acute distress.  HENT:  Head: Atraumatic.  Right Ear: External ear normal.  Left Ear: External ear normal.  Mouth/Throat: Oropharynx is clear and moist.  Eyes: Conjunctivae and EOM are normal. Pupils are equal, round, and reactive to light.  Neck: Normal range of motion. Neck supple.  No nuchal rigidity  Cardiovascular: Normal rate and regular rhythm.   Pulmonary/Chest: Effort normal and breath sounds normal.  Abdominal: Soft. There is no tenderness.  Neurological: He is alert. He has normal strength. No cranial nerve deficit or sensory deficit. He displays a negative Romberg sign. Coordination and gait normal. GCS eye subscore is 4. GCS  verbal subscore is 5. GCS motor subscore is 6.  Skin: No rash noted.  Psychiatric: He has a normal mood and affect.  Nursing note and vitals reviewed.   ED Course  Procedures (including critical care time) Labs Review Labs Reviewed - No data to display  Imaging Review Dg Chest 2 View  04/01/2015  CLINICAL DATA:  40 year old male with shortness of breath and right-sided chest pain EXAM:  CHEST  2 VIEW COMPARISON:  Radiograph dated 03/27/2015 FINDINGS: The heart size and mediastinal contours are within normal limits. Both lungs are clear. The visualized skeletal structures are unremarkable. IMPRESSION: No active cardiopulmonary disease. Electronically Signed   By: Elgie Collard M.D.   On: 04/01/2015 02:39   I have personally reviewed and evaluated these images and lab results as part of my medical decision-making.   EKG Interpretation None      MDM   Final diagnoses:  Bad headache    BP 121/68 mmHg  Pulse 76  Temp(Src) 98.4 F (36.9 C) (Oral)  Resp 18  SpO2 100%   11:52 PM Although patient is here with a complaint of headache, his primary focus is to find a place to rest due to being homeless.  He has no red flags.  He has been evaluated for the same headache earlier today.  He is in no acute distress.  I will provide outpt resources and will give fioricet for his headache.  Pt stable for discharge.    Fayrene Helper, PA-C 04/02/15 1610  Derwood Kaplan, MD 04/02/15 [phone]

## 2015-04-01 NOTE — ED Notes (Signed)
Patient given sprite, sandwich, graham crackers, and peanut butter.

## 2015-04-01 NOTE — ED Notes (Signed)
Pt notified of the need of blood work. Pt stated "no, i dont want that" asked pt if he was refusing the blood work and pt confirmed to me twice that he was refusing to get blood work done stating" i dont want that i got blood work done before and its still swollen."

## 2015-04-01 NOTE — Progress Notes (Signed)
CSW met with patient at bedside. Patient confirms that he is homeless. Patient states that he is from [place]. According to patient, he has been homeless for 2 years and has been staying in [place] since November.  Patient states that he does not have a good support system. Patient states that he does have one family member in [place]. However, he states that he has not been able to stay with him.  Patient informed CSW that he is employed. Patient states that he receives food stamps, and has bus passes. Patient states that he has been paying to stay in hotels, and often runs out of money. CSW reached out to [facility] and spoke with Tammi. She states patient can come to the shelter tonight and stay in their overflow section if he is escorted by a Engineer, structural. She also states that patient needs to go to [facility] and obtain a Green Card in order for him to try and obtain a bed there for the future. Patient made aware of this information.  Patient expressed to CSW that he would like to go back to [place]. Patient states his aunt may take him in.  Patient informed CSW that he presents to [facility] due to migraines.   Willette Brace [phone] ED CSW 04/01/2015 9:50 PM

## 2015-04-02 ENCOUNTER — Emergency Department (HOSPITAL_COMMUNITY)
Admission: EM | Admit: 2015-04-02 | Discharge: 2015-04-02 | Disposition: A | Discharge status: Home / Self Care | Payer: Self-pay | Attending: Emergency Medicine | Admitting: Emergency Medicine

## 2015-04-02 ENCOUNTER — Encounter (HOSPITAL_COMMUNITY): Payer: Self-pay | Admitting: Emergency Medicine

## 2015-04-02 DIAGNOSIS — M79672 Pain in left foot: Secondary | ICD-10-CM | POA: Insufficient documentation

## 2015-04-02 DIAGNOSIS — G43909 Migraine, unspecified, not intractable, without status migrainosus: Secondary | ICD-10-CM | POA: Insufficient documentation

## 2015-04-02 DIAGNOSIS — F17210 Nicotine dependence, cigarettes, uncomplicated: Secondary | ICD-10-CM | POA: Insufficient documentation

## 2015-04-02 DIAGNOSIS — J45909 Unspecified asthma, uncomplicated: Secondary | ICD-10-CM | POA: Insufficient documentation

## 2015-04-02 DIAGNOSIS — Z590 Homelessness: Secondary | ICD-10-CM | POA: Insufficient documentation

## 2015-04-02 DIAGNOSIS — Z8659 Personal history of other mental and behavioral disorders: Secondary | ICD-10-CM | POA: Insufficient documentation

## 2015-04-02 DIAGNOSIS — Z79899 Other long term (current) drug therapy: Secondary | ICD-10-CM | POA: Insufficient documentation

## 2015-04-02 MED ORDER — CLOTRIMAZOLE 1 % EX CREA: TOPICAL_CREAM | CUTANEOUS | Status: DC

## 2015-04-02 MED ORDER — BUTALBITAL-APAP-CAFFEINE 50-325-40 MG PO TABS: 1.0000 | ORAL_TABLET | Freq: Four times a day (QID) | ORAL | Status: DC | PRN

## 2015-04-02 NOTE — ED Notes (Signed)
Pt states that he has had rash on his L foot for 3 weeks. Tried cream but its not working. Alert andoriented.

## 2015-04-02 NOTE — Progress Notes (Signed)
Patient noted to have been seen in the Ed 15 times within the last six months.  Patient listed as not having pcp or insurance living in [facility]. Patient seen in ED last night and discharged early this am.  Patient's address is at the [facility].  At [facility], patient may receive medical attention from NP and assistance with the cost of his medications, which would be free of charge.  No further EDCM needs at this time.

## 2015-04-02 NOTE — Progress Notes (Signed)
CSW met with patient at bedside. Patient informed CSW that he presents to [facility] due to foot swelling and fungus.  CSW spoke with patient last night regarding shelters. CSW educated patient today about [employer] , and the importance of obtaining a [employer], which was suggested that he do by the shelter in order to help him get a bed at [facility].  CSW provided patient with community resources. CSW called shelter and spoke with Glenard Haring, she states that patient will have to call at 11:30 to speak with 3rd shift staff to ensure that he can come to stay. Patient was welcomed to stay in overflow last night. However, patient did not go.  Willette Brace [phone] ED CSW 04/02/2015 10:33 PM

## 2015-04-02 NOTE — ED Notes (Addendum)
Patient given bus pass.

## 2015-04-02 NOTE — ED Provider Notes (Signed)
CSN: [id number]     Arrival date & time 04/02/15  1946 History  By signing my name below, I, Dustin Norris, attest that this documentation has been prepared under the direction and in the presence of [employer], PA-C. Electronically Signed: [name], Scribe. 04/02/2015. 10:06 PM.  Chief Complaint  Patient presents with  . Foot Pain   The history is provided by the patient. No language interpreter was used.   HPI Comments: Dustin Norris is a 40 y.o. male who presents to the Emergency Department complaining of sudden onset of a foot fungus to his left foot which has been ongoing for the past month. He also reports an associated foul smell, edema of his left foot, and difficulty walking secondary to his foot discomfort. Pt has tried applying foot cream to the site with some relief.  Also reports he has recently started wearing new boots that are not broken in yet. Pt denies fever and hx of DM. No numbness or weakness. He has not followed up with previous referrals. Requesting refill of foot cream and Ace wrap. No other complaints  Past Medical History  Diagnosis Date  . Anxiety   . Asthma   . Homelessness   . Migraine    History reviewed. No pertinent past surgical history. History reviewed. No pertinent family history. Social History  Substance Use Topics  . Smoking status: Current Every Day Smoker -- 0.00 packs/day for 0 years    Types: Cigarettes  . Smokeless tobacco: None  . Alcohol Use: No    Review of Systems  A complete 10 system review of systems was obtained and all systems are negative except as noted in the HPI and PMH.   Allergies  Review of patient's allergies indicates no known allergies.  Home Medications   Prior to Admission medications   Medication Sig Start Date End Date Taking? Authorizing Provider  albuterol (PROVENTIL HFA;VENTOLIN HFA) 108 (90 Base) MCG/ACT inhaler Inhale 2 puffs into the lungs every 6 (six) hours as needed for  wheezing or shortness of breath.    Historical Provider, MD  butalbital-acetaminophen-caffeine (FIORICET) 50-325-40 MG tablet Take 1-2 tablets by mouth every 6 (six) hours as needed for headache. 04/02/15 04/01/16  Fayrene Helper, PA-C  clotrimazole (LOTRIMIN) 1 % cream Apply to affected area 2 times daily 04/02/15   Joycie Peek, PA-C  guaiFENesin-dextromethorphan (ROBITUSSIN DM) 100-10 MG/5ML syrup Take 5 mLs by mouth every 4 (four) hours as needed for cough. 03/27/15   Tatyana Kirichenko, PA-C  ibuprofen (ADVIL,MOTRIN) 800 MG tablet Take 1 tablet (800 mg total) by mouth every 8 (eight) hours as needed for mild pain. 03/25/15   Kristen N Ward, DO  metoCLOPramide (REGLAN) 10 MG tablet Take 1 tablet (10 mg total) by mouth every 6 (six) hours as needed for nausea (or headache). 03/26/15   Dione Booze, MD  predniSONE (DELTASONE) 20 MG tablet Take 2 tablets (40 mg total) by mouth daily. Patient not taking: Reported on 04/01/2015 03/27/15   Tatyana Kirichenko, PA-C   BP 121/91 mmHg  Pulse 64  Temp(Src) 97.8 F (36.6 C) (Oral)  Resp 18  SpO2 99% Physical Exam  Constitutional: He is oriented to person, place, and time. He appears well-developed and well-nourished. No distress.  HENT:  Head: Normocephalic and atraumatic.  Eyes: Conjunctivae are normal.  Cardiovascular: Normal rate, regular rhythm and normal heart sounds.   Pulmonary/Chest: Effort normal and breath sounds normal.  Abdominal: Soft. He exhibits no distension. There is no tenderness.  Musculoskeletal:  Mild tenderness and erythema to medial aspect of left great toe Skin tissue around toes mildly macerated but without any overt erythema or warrmth Pulses intact Full active ROM.  Neurological: He is alert and oriented to person, place, and time.  Skin: Skin is warm and dry.  Psychiatric: He has a normal mood and affect.  Nursing note and vitals reviewed.   ED Course  Procedures  DIAGNOSTIC STUDIES:    Oxygen Saturation is 100% on RA,  normal by my interpretation.   COORDINATION OF CARE:  10:07 PM Will prescribe pt foot ointment and will give pt ace bandage wrap. Discussed treatment plan with pt at bedside and pt agreed to plan.   Imaging Review Dg Chest 2 View  04/01/2015  CLINICAL DATA:  40 year old male with shortness of breath and right-sided chest pain EXAM: CHEST  2 VIEW COMPARISON:  Radiograph dated 03/27/2015 FINDINGS: The heart size and mediastinal contours are within normal limits. Both lungs are clear. The visualized skeletal structures are unremarkable. IMPRESSION: No active cardiopulmonary disease. Electronically Signed   By: Elgie Collard M.D.   On: 04/01/2015 02:39   I have personally reviewed and evaluated these images as part of my medical decision-making.  MDM  Dustin Norris is a 40 y.o. male with a history of homelessness comes in for evaluation of foot pain. Symptoms have been ongoing for the past one month. Patient is requesting foot cream and Ace wrap. Exam is not concerning for cellulitis. Likely mild fungal infection. DC with Lotrimin and Ace wrap for comfort. Discussed keeping feet clean and dry. Follow-up with PCP and podiatry as previously referred. The patient appears reasonably screened and/or stabilized for discharge and I doubt any other medical condition or other [facility] requiring further screening, evaluation, or treatment in the ED at this time prior to discharge.   Final diagnoses:  Foot pain, left    I personally performed the services described in this documentation, which was scribed in my presence. The recorded information has been reviewed and is accurate.    Joycie Peek, PA-C 04/02/15 [id number]  Lyndal Pulley, MD 04/03/15 [phone]

## 2015-04-02 NOTE — Discharge Instructions (Signed)
Use your medications as prescribed. Follow-up with your doctor as needed. Try to keep your feet clean and dry. Return to ED for any new or worsening symptoms.  Musculoskeletal Pain Musculoskeletal pain is muscle and boney aches and pains. These pains can occur in any part of the body. Your caregiver may treat you without knowing the cause of the pain. They may treat you if blood or urine tests, X-rays, and other tests were normal.  CAUSES There is often not a definite cause or reason for these pains. These pains may be caused by a type of germ (virus). The discomfort may also come from overuse. Overuse includes working out too hard when your body is not fit. Boney aches also come from weather changes. Bone is sensitive to atmospheric pressure changes. HOME CARE INSTRUCTIONS   Ask when your test results will be ready. Make sure you get your test results.  Only take over-the-counter or prescription medicines for pain, discomfort, or fever as directed by your caregiver. If you were given medications for your condition, do not drive, operate machinery or power tools, or sign legal documents for 24 hours. Do not drink alcohol. Do not take sleeping pills or other medications that may interfere with treatment.  Continue all activities unless the activities cause more pain. When the pain lessens, slowly resume normal activities. Gradually increase the intensity and duration of the activities or exercise.  During periods of severe pain, bed rest may be helpful. Lay or sit in any position that is comfortable.  Putting ice on the injured area.  Put ice in a bag.  Place a towel between your skin and the bag.  Leave the ice on for 15 to 20 minutes, 3 to 4 times a day.  Follow up with your caregiver for continued problems and no reason can be found for the pain. If the pain becomes worse or does not go away, it may be necessary to repeat tests or do additional testing. Your caregiver may need to look  further for a possible cause. SEEK IMMEDIATE MEDICAL CARE IF:  You have pain that is getting worse and is not relieved by medications.  You develop chest pain that is associated with shortness or breath, sweating, feeling sick to your stomach (nauseous), or throw up (vomit).  Your pain becomes localized to the abdomen.  You develop any new symptoms that seem different or that concern you. MAKE SURE YOU:   Understand these instructions.  Will watch your condition.  Will get help right away if you are not doing well or get worse.   This information is not intended to replace advice given to you by your health care provider. Make sure you discuss any questions you have with your health care provider.   Document Released: 01/18/2005 Document Revised: 04/12/2011 Document Reviewed: 09/22/2012 Elsevier Interactive Patient Education [employer]

## 2015-04-02 NOTE — Discharge Instructions (Signed)
Please take fioricet as needed for your headache.  Use resources below to help find a shelter to stay.     Emergency Department Resource Guide 1) Find a Doctor and Pay Out of Pocket Although you won't have to find out who is covered by your insurance plan, it is a good idea to ask around and get recommendations. You will then need to call the office and see if the doctor you have chosen will accept you as a new patient and what types of options they offer for patients who are self-pay. Some doctors offer discounts or will set up payment plans for their patients who do not have insurance, but you will need to ask so you aren't surprised when you get to your appointment.  2) Contact Your Local Health Department Not all health departments have doctors that can see patients for sick visits, but many do, so it is worth a call to see if yours does. If you don't know where your local health department is, you can check in your phone book. The CDC also has a tool to help you locate your state's health department, and many state websites also have listings of all of their local health departments.  3) Find a Walk-in Clinic If your illness is not likely to be very severe or complicated, you may want to try a walk in clinic. These are popping up all over the country in pharmacies, drugstores, and shopping centers. They're usually staffed by nurse practitioners or physician assistants that have been trained to treat common illnesses and complaints. They're usually fairly quick and inexpensive. However, if you have serious medical issues or chronic medical problems, these are probably not your best option.  No Primary Care Doctor: - Call Health Connect at  [phone] - they can help you locate a primary care doctor that  accepts your insurance, provides certain services, etc. - Physician Referral Service- [phone]  Chronic Pain Problems: Organization         Address  Phone   Notes  [facility]  [phone] Patients need to be referred by their primary care doctor.   Medication Assistance: Organization         Address  Phone   Notes  [facility] [address] Medication [facility] [address] [place] [place], [place] [address] [phone] --Must be a resident of [facility] -- Must have NO insurance coverage whatsoever (no Medicaid/ Medicare, etc.) -- The pt. MUST have a primary care doctor that directs their care regularly and follows them in the community   [facility]  [phone]   [employer]  [phone]    Agencies that provide inexpensive medical care: Organization         Address  Phone   Notes  [facility]  [phone]   [facility]    [phone]   [facility] [address] [place], [place] [address] [phone]   [facility] [address], [place] [phone]   Planned Parenthood    [phone]   [facility]    [phone]   [facility]  [address], [place] Phone:  [phone], Fax:  [phone] Hours of Operation:  9 am - 6 pm, M-F.  Also accepts Medicaid/Medicare and self-pay.  [facility]  [address], [place] Phone: [phone], Fax: [phone]. Hours of Operation:  8:30 am - 5:30 pm, M-F.  Also accepts Medicaid and self-pay.  [facility] [place] [address], [place] Phone: [phone]   [facility] [address] Natasha Bence [place], [place] [phone], Ext. [phone] Mondays & Thursdays: 7-9 AM.  First 15 patients are seen on a first come, first serve basis.    Medicaid-accepting [facility] Providers:  Organization         Address  Phone   Notes  [facility] [address], [place] [phone] Also accepts self-pay patients.  [facility] [address] Laurell Josephs [place],  [place]  [phone]   [facility] [address], [place] [phone]   [facility] [address] [place] [phone]   Renaye Rakers [address], [place]   [phone] Only accepts [place] patients after they have their name applied to their card.   Self-Pay (no insurance) in [facility]:  Organization         Address  Phone   Notes  Sickle Cell Patients, [facility] [address] [place], [place] [phone]   [facility] [address] [place], [place] [phone]   [facility] [place]  [address], [place] [phone]   [facility]/Dr. Osei-Bonsu  [address], [place] or [address], [place] [phone] Phone number for both [place] and [place] locations is the same.  [facility] [address] [place] [phone]   [facility] [address], [place] or [address] [phone]   [facility] [address] [place] [phone], phone; [phone], fax Sees patients 1st and 3rd Saturday of every month.  Must not qualify for public or private insurance (i.e. Medicaid, Medicare, [place] Health Choice, Veterans' Benefits)  Household income should be no more than 200% of the poverty level The clinic cannot treat you if you are pregnant or think you are pregnant  Sexually transmitted diseases are not treated at the clinic.    Dental Care: Organization         Address  Phone  Notes  [facility] Department of [facility] [address] [place], [place] [phone] Accepts children up to age 41 who are enrolled in [place] or [place] Health Choice; pregnant women with a Medicaid card; and children who have applied for Medicaid or [place] Health  Choice, but were declined, whose parents can pay a reduced fee at time of service.  [facility] Department of [facility]  [address], [place] [phone] Accepts children up to age 54 who are enrolled in [place] or [place] Health Choice; pregnant women with a Medicaid card; and children who have applied for Medicaid or  Health Choice, but were declined, whose parents can pay a reduced fee at time of service.  [facility]  [address] [place], [place] [phone] Patients are seen by appointment only. Walk-ins are not accepted. [facility] will see patients 19 years of age and older. Monday - Tuesday (8am-5pm) Most Wednesdays (8:30-5pm) $30 per visit, cash only  [facility] [employer] [facility]  [address], [employer] [phone])  [phone] Patients are seen by appointment only. Walk-ins are not accepted. [facility] will see patients 40 years of age and older. One Wednesday Evening (Monthly: Volunteer Based).  $30 per visit, cash only  [employer]  [phone] for adults; Children under age 89, call Graduate Pediatric Dentistry at [phone]. Children aged 28-14, please call [phone] to request a pediatric application.  Dental services are provided in all areas of dental care including fillings, crowns and bridges, complete and partial dentures, implants, gum treatment, root canals, and extractions. Preventive care is also provided. Treatment is provided to both adults and children. Patients are selected via a lottery and there is often a waiting list.   [facility] [address] [place]  [phone] [email or web address]   [facility] [address] [place], [place] [phone], Ext. [phone] Second and Fourth Thursday of each month, opens at 6:30 AM; Clinic ends at 9 AM.  Patients are seen on a first-come first-served basis, and a limited number are seen during each  clinic.   [facility]  [address] Ether Griffins [place], [place] [phone]   Eligibility Requirements You must have lived in [place], [place], or [place] counties for at least the last three months.   You cannot be eligible for state or federal sponsored [place], including [employer], [place], or [employer].   You generally cannot be eligible for healthcare insurance through your employer.    How to apply: Eligibility screenings are held every Tuesday and Wednesday afternoon from 1:00 pm until 4:00 pm. You do not need an appointment for the interview!  [facility] [address] [place], [place] [phone]   [facility] Health Department  [phone]   [facility] Health Department  [phone]   [facility] Health Department  [phone]    Behavioral Health Resources in the Community: Intensive Outpatient Programs Organization         Address  Phone  Notes  [facility] [address], [place], [place] [phone]   [facility] [address], [place], [place] [phone]   [facility] [address] [place], [place]  [phone]   [facility] [address]  [place], [place] [phone] or [phone]   Substance Abuse Resources Organization         Address  Phone  Notes  [facility] Drug Services  [phone]   [facility]  [phone]   [facility] [place]  [phone]   Floydene Flock  [phone]   Residential & Outpatient Substance Abuse Program  [phone]   Psychological Services Organization         Address  Phone  Notes  [facility]  [phone]   [facility]  [phone]   [facility] Mental [facility] [address] [place] [phone] or [phone]    Mobile Crisis Teams Organization         Address  Phone  Notes  [facility], Mobile  Crisis Care Unit  [phone]   [facility]  [address] [place], [place] [phone]   Doristine Locks [address] [place] [phone]    Self-Help/Support Groups Organization         Address  Phone             Notes  [facility] [place] - variety of support groups  [phone]  [phone] Call for more information  Narcotics Anonymous (NA), [facility] [address], [employer] [place]  2 meetings at this location   Residential Sports administrator         Address  Phone  Notes  [facility] [address],    [place]  [phone]   [facility]  [address] [place] [address], [place], [place] [phone]   [facility] [address] [place], [place] [phone] Admissions: 8am-3pm M-F  [facility] [address],    [place], [place] [phone]   [facility] [address] [place], [place], [place] [phone]   [facility] [address]  [place], [place] [phone]   [facility] - Intensive Outpatient [address]ure[address] Joseph[address], [place], [place] [phone]   [facility] ([facility]iction [facility] [address] [place]  [place], [place] [phone] or [phone]   [facility] ([facility]) [address], [place], [place] [phone] Accepts Medicaid  [facility] [address],  [place] [phone] Substance Abuse/Addiction Treatment   [facility] Organization         Address  Phone  Notes  [facility]  [phone]   Angie Fava, PhD [address] Ervin Knack [place], [place]   [phone] or [phone]   [facility] Behavioral   [address] [place], [place] [phone]   [facility] [address] [place], [place] [phone] Insurance/Medicaid/sponsorship through [facility] [place] [facility] and Families [address]                                    [place], [place] [phone] Therapy/tele-psych/case  [facility] [address], [place] [phone]    Dr. Lolly Mustache  [phone]   [facility]  [employer] [facility] Dept. 1) [address], [place] 2) [address], [place] 3)  [address], [place] [phone]  [phone]   [facility] Child Abuse Hotline (401)190-9184 or [phone] (After Hours)

## 2015-04-02 NOTE — ED Notes (Signed)
Discharge instructions, follow up care, and rx x1 reviewed with patient. Patient verbalized understanding. 

## 2015-04-07 ENCOUNTER — Emergency Department (HOSPITAL_COMMUNITY)
Admission: EM | Admit: 2015-04-07 | Discharge: 2015-04-07 | Disposition: A | Discharge status: Home / Self Care | Payer: Self-pay | Attending: Emergency Medicine | Admitting: Emergency Medicine

## 2015-04-07 ENCOUNTER — Encounter (HOSPITAL_COMMUNITY): Payer: Self-pay | Admitting: Emergency Medicine

## 2015-04-07 DIAGNOSIS — Z79899 Other long term (current) drug therapy: Secondary | ICD-10-CM | POA: Insufficient documentation

## 2015-04-07 DIAGNOSIS — F17210 Nicotine dependence, cigarettes, uncomplicated: Secondary | ICD-10-CM | POA: Insufficient documentation

## 2015-04-07 DIAGNOSIS — J45909 Unspecified asthma, uncomplicated: Secondary | ICD-10-CM | POA: Insufficient documentation

## 2015-04-07 DIAGNOSIS — Z590 Homelessness: Secondary | ICD-10-CM | POA: Insufficient documentation

## 2015-04-07 DIAGNOSIS — G43909 Migraine, unspecified, not intractable, without status migrainosus: Secondary | ICD-10-CM | POA: Insufficient documentation

## 2015-04-07 DIAGNOSIS — Z8659 Personal history of other mental and behavioral disorders: Secondary | ICD-10-CM | POA: Insufficient documentation

## 2015-04-07 DIAGNOSIS — B353 Tinea pedis: Secondary | ICD-10-CM | POA: Insufficient documentation

## 2015-04-07 MED ORDER — MICONAZOLE NITRATE POWD: Freq: Two times a day (BID) | Status: DC

## 2015-04-07 MED ORDER — TOLNAFTATE 1 % EX POWD
Freq: Two times a day (BID) | CUTANEOUS | Status: DC
  Administered 2015-04-07: 20:00:00 via TOPICAL
  Filled 2015-04-07: qty 45

## 2015-04-07 MED ORDER — ACETAMINOPHEN 325 MG PO TABS
650.0000 mg | ORAL_TABLET | Freq: Once | ORAL | Status: AC
  Administered 2015-04-07: 650 mg via ORAL
  Filled 2015-04-07: qty 2

## 2015-04-07 NOTE — Discharge Instructions (Signed)
Apply topical powder twice a day. Keep your feet dry and clean. Follow-up with primary care doctor.   Athlete's Foot Athlete's foot (tinea pedis) is a fungal infection of the skin on the feet. It often occurs on the skin between the toes or underneath the toes. It can also occur on the soles of the feet. Athlete's foot is more likely to occur in hot, humid weather. Not washing your feet or changing your socks often enough can contribute to athlete's foot. The infection can spread from person to person (contagious). CAUSES Athlete's foot is caused by a fungus. This fungus thrives in warm, moist places. Most people get athlete's foot by sharing shower stalls, towels, and wet floors with an infected person. People with weakened immune systems, including those with diabetes, may be more likely to get athlete's foot. SYMPTOMS   Itchy areas between the toes or on the soles of the feet.  White, flaky, or scaly areas between the toes or on the soles of the feet.  Tiny, intensely itchy blisters between the toes or on the soles of the feet.  Tiny cuts on the skin. These cuts can develop a bacterial infection.  Thick or discolored toenails. DIAGNOSIS  Your caregiver can usually tell what the problem is by doing a physical exam. Your caregiver may also take a skin sample from the rash area. The skin sample may be examined under a microscope, or it may be tested to see if fungus will grow in the sample. A sample may also be taken from your toenail for testing. TREATMENT  Over-the-counter and prescription medicines can be used to kill the fungus. These medicines are available as powders or creams. Your caregiver can suggest medicines for you. Fungal infections respond slowly to treatment. You may need to continue using your medicine for several weeks. PREVENTION   Do not share towels.  Wear sandals in wet areas, such as shared locker rooms and shared showers.  Keep your feet dry. Wear shoes that allow  air to circulate. Wear cotton or wool socks. HOME CARE INSTRUCTIONS   Take medicines as directed by your caregiver. Do not use steroid creams on athlete's foot.  Keep your feet clean and cool. Wash your feet daily and dry them thoroughly, especially between your toes.  Change your socks every day. Wear cotton or wool socks. In hot climates, you may need to change your socks 2 to 3 times per day.  Wear sandals or canvas tennis shoes with good air circulation.  If you have blisters, soak your feet in Burow's solution or Epsom salts for 20 to 30 minutes, 2 times a day to dry out the blisters. Make sure you dry your feet thoroughly afterward. SEEK MEDICAL CARE IF:   You have a fever.  You have swelling, soreness, warmth, or redness in your foot.  You are not getting better after 7 days of treatment.  You are not completely cured after 30 days.  You have any problems caused by your medicines. MAKE SURE YOU:   Understand these instructions.  Will watch your condition.  Will get help right away if you are not doing well or get worse.   This information is not intended to replace advice given to you by your health care provider. Make sure you discuss any questions you have with your health care provider.   Document Released: 01/16/2000 Document Revised: 04/12/2011 Document Reviewed: 07/22/2014 Elsevier Interactive Patient Education [employer]2016 Elsevier Inc.

## 2015-04-07 NOTE — ED Provider Notes (Signed)
CSN: [id number]     Arrival date & time 04/07/15  1617 History  By signing my name below, I, [facility], attest that this documentation has been prepared under the direction and in the presence of Hend Mccarrell, PA-C. Electronically Signed: Randell Patient, ED Scribe. 04/07/2015. 6:16 PM.   Chief Complaint  Patient presents with  . Foot Pain    The history is provided by the patient. No language interpreter was used.   HPI Comments: Dustin Norris is a 40 y.o. male who presents to the Emergency Department complaining of a unchanging, mild left foot fungus ongoing for the past month. Patient reports that he is homeless and has been applying a cream and changing his socks without relief. Per medical records, patient has been seen multiple times in the past for the similar symptoms, most recently 5 days ago by Joycie Peek, PA-C who prescribed him Lotrimin. He has not been able to fill this prescription due to a lack of money and insurance and states that he has run out of the topical cream he has been applying. He denies having applied powder to the area. He denies any other symptoms currently.  Past Medical History  Diagnosis Date  . Anxiety   . Asthma   . Homelessness   . Migraine    History reviewed. No pertinent past surgical history. No family history on file. Social History  Substance Use Topics  . Smoking status: Current Every Day Smoker -- 0.00 packs/day for 0 years    Types: Cigarettes  . Smokeless tobacco: None  . Alcohol Use: No    Review of Systems  Constitutional: Negative for fever and chills.  Musculoskeletal: Positive for arthralgias.  Skin: Positive for color change (Left foot fungus), rash and wound.      Allergies  Review of patient's allergies indicates no known allergies.  Home Medications   Prior to Admission medications   Medication Sig Start Date End Date Taking? Authorizing Provider  albuterol (PROVENTIL HFA;VENTOLIN HFA) 108  (90 Base) MCG/ACT inhaler Inhale 2 puffs into the lungs every 6 (six) hours as needed for wheezing or shortness of breath.    Historical Provider, MD  butalbital-acetaminophen-caffeine (FIORICET) 50-325-40 MG tablet Take 1-2 tablets by mouth every 6 (six) hours as needed for headache. 04/02/15 04/01/16  Fayrene Helper, PA-C  clotrimazole (LOTRIMIN) 1 % cream Apply to affected area 2 times daily 04/02/15   Joycie Peek, PA-C  guaiFENesin-dextromethorphan (ROBITUSSIN DM) 100-10 MG/5ML syrup Take 5 mLs by mouth every 4 (four) hours as needed for cough. 03/27/15   Amias Hutchinson, PA-C  ibuprofen (ADVIL,MOTRIN) 800 MG tablet Take 1 tablet (800 mg total) by mouth every 8 (eight) hours as needed for mild pain. 03/25/15   Kristen N Ward, DO  metoCLOPramide (REGLAN) 10 MG tablet Take 1 tablet (10 mg total) by mouth every 6 (six) hours as needed for nausea (or headache). 03/26/15   Dione Booze, MD  predniSONE (DELTASONE) 20 MG tablet Take 2 tablets (40 mg total) by mouth daily. Patient not taking: Reported on 04/01/2015 03/27/15   Larin Weissberg, PA-C   BP 135/93 mmHg  Pulse 95  Temp(Src) 97.6 F (36.4 C) (Oral)  Resp 18  SpO2 100% Physical Exam  Constitutional: He is oriented to person, place, and time. He appears well-developed and well-nourished. No distress.  HENT:  Head: Normocephalic and atraumatic.  Eyes: Conjunctivae and EOM are normal.  Neck: Neck supple. No tracheal deviation present.  Cardiovascular: Normal rate.   Pulmonary/Chest: Effort normal.  No respiratory distress.  Musculoskeletal: Normal range of motion.  Patient's foot is malodorous. There is erythema with some maceration of the skin to toes 4 through 5. There is no warmth to the touch. No drainage. No erythema extending over the foot. Mild tenderness over the toes.   Neurological: He is alert and oriented to person, place, and time.  Skin: Skin is warm and dry.  Psychiatric: He has a normal mood and affect. His behavior is normal.   Nursing note and vitals reviewed.   ED Course  Procedures   DIAGNOSTIC STUDIES: Oxygen Saturation is 100% on RA, normal by my interpretation.    COORDINATION OF CARE: 6:08 PM Will order Micatin and Tylenol. Discussed treatment plan with pt at bedside and pt agreed to plan.   Labs Review Labs Reviewed - No data to display  Imaging Review No results found. I have personally reviewed and evaluated these images and lab results as part of my medical decision-making.   EKG Interpretation None      MDM   Final diagnoses:  Tinea pedis of left foot   Patient with persistent tinea pedis, currently complaining of left foot infection. Instructed him to keep his feet dry, will change to antifungal powder. His feet are moist during the exam. Patient is currently homeless and is trying to get to [place] where he has a home but unable to afford this at present time. He is waiting for his mother to transfer him some money. We'll discharge home with the powder, follow-up as needed. At this time no evidence of secondary cellulitis. He is afebrile nontoxic appearing.  Filed Vitals:   04/07/15 1718  BP: 135/93  Pulse: 95  Temp: 97.6 F (36.4 C)  TempSrc: Oral  Resp: 18  SpO2: 100%    I personally performed the services described in this documentation, which was scribed in my presence. The recorded information has been reviewed and is accurate.   Jaynie Crumbleatyana Axie Hayne, PA-C 04/07/15 1925  Lorre NickAnthony Allen, MD 04/07/15 2337

## 2015-04-07 NOTE — ED Notes (Signed)
Patient presents c/o left foot fungus x3 weeks. Denies injury to same, recently seen for same. Unable to fill prescription due to price. Odorous with mild swelling to toes.

## 2015-04-10 ENCOUNTER — Emergency Department (HOSPITAL_COMMUNITY): Payer: Self-pay

## 2015-04-10 ENCOUNTER — Emergency Department (HOSPITAL_COMMUNITY)
Admission: EM | Admit: 2015-04-10 | Discharge: 2015-04-11 | Discharge status: Against Medical Advice | Payer: Self-pay | Attending: Emergency Medicine | Admitting: Emergency Medicine

## 2015-04-10 ENCOUNTER — Encounter (HOSPITAL_COMMUNITY): Payer: Self-pay | Admitting: *Deleted

## 2015-04-10 DIAGNOSIS — F17210 Nicotine dependence, cigarettes, uncomplicated: Secondary | ICD-10-CM | POA: Insufficient documentation

## 2015-04-10 DIAGNOSIS — Z590 Homelessness: Secondary | ICD-10-CM | POA: Insufficient documentation

## 2015-04-10 DIAGNOSIS — R0789 Other chest pain: Secondary | ICD-10-CM | POA: Insufficient documentation

## 2015-04-10 DIAGNOSIS — Z8659 Personal history of other mental and behavioral disorders: Secondary | ICD-10-CM | POA: Insufficient documentation

## 2015-04-10 DIAGNOSIS — Z79899 Other long term (current) drug therapy: Secondary | ICD-10-CM | POA: Insufficient documentation

## 2015-04-10 DIAGNOSIS — R109 Unspecified abdominal pain: Secondary | ICD-10-CM | POA: Insufficient documentation

## 2015-04-10 DIAGNOSIS — G43909 Migraine, unspecified, not intractable, without status migrainosus: Secondary | ICD-10-CM | POA: Insufficient documentation

## 2015-04-10 DIAGNOSIS — J45909 Unspecified asthma, uncomplicated: Secondary | ICD-10-CM | POA: Insufficient documentation

## 2015-04-10 NOTE — ED Notes (Signed)
Patient refused to have blood collect

## 2015-04-10 NOTE — ED Notes (Signed)
Pt arrives to the ER via EMS for complaints of chest pain, shortness of breath, and headache; pt [facility]; pt states that he began having the pain while he was walking; pt also advised that he missed the cut off time to get in the shelter

## 2015-04-10 NOTE — ED Notes (Addendum)
Patient refused lab draw. RN made aware. 

## 2015-04-10 NOTE — ED Notes (Signed)
Called to room pt with no response. RN notified

## 2015-04-11 ENCOUNTER — Encounter (HOSPITAL_COMMUNITY): Payer: Self-pay | Admitting: Emergency Medicine

## 2015-04-11 NOTE — ED Notes (Signed)
EDP at the bedside.  ?

## 2015-04-11 NOTE — ED Provider Notes (Signed)
CSN: [id number]     Arrival date & time 04/10/15  2039 History   By signing my name below, I, Iona Beard, attest that this documentation has been prepared under the direction and in the presence of Anjuli Gemmill, MD.   Electronically Signed: Iona Beard, ED Scribe. 04/11/2015. 12:02 AM     Chief Complaint  Patient presents with  . Chest Pain     Patient is a 40 y.o. male presenting with chest pain. The history is provided by the patient. No language interpreter was used.  Chest Pain Pain location:  Unable to specify Pain quality: not aching   Pain radiates to:  Does not radiate Pain radiates to the back: no   Pain severity:  Mild Onset quality:  Gradual Timing:  Constant Progression:  Unable to specify Relieved by:  Nothing Worsened by:  Nothing tried Ineffective treatments:  None tried Associated symptoms: abdominal pain   Associated symptoms: no cough, no fever, no palpitations, no shortness of breath and not vomiting   Risk factors: no aortic disease    HPI Comments: Dustin Norris is a 40 y.o. male who presents to the Emergency Department complaining of gradual onset, constant, sharp, non-radiating, cental chest pain, onset earlier tonight while he was walking. He states that he has had similar chest pain in the past. He reports associated abdominal pain. No worsening or alleviating factors noted. Pt denies wheezing, coughing, vomiting, or any other pertinent symptoms.  Pt refused to have blood work drawn multiple times against physician's advice.   Past Medical History  Diagnosis Date  . Anxiety   . Asthma   . Homelessness   . Migraine    History reviewed. No pertinent past surgical history. No family history on file. Social History  Substance Use Topics  . Smoking status: Current Every Day Smoker -- 2.00 packs/day for 0 years    Types: Cigarettes  . Smokeless tobacco: None  . Alcohol Use: No    Review of Systems  Constitutional: Negative for  fever.  Respiratory: Negative for cough, shortness of breath and wheezing.   Cardiovascular: Positive for chest pain. Negative for palpitations and leg swelling.  Gastrointestinal: Positive for abdominal pain. Negative for vomiting.  All other systems reviewed and are negative.   Allergies  Review of patient's allergies indicates no known allergies.  Home Medications   Prior to Admission medications   Medication Sig Start Date End Date Taking? Authorizing Provider  albuterol (PROVENTIL HFA;VENTOLIN HFA) 108 (90 Base) MCG/ACT inhaler Inhale 2 puffs into the lungs every 6 (six) hours as needed for wheezing or shortness of breath.   Yes Historical Provider, MD  butalbital-acetaminophen-caffeine (FIORICET) 50-325-40 MG tablet Take 1-2 tablets by mouth every 6 (six) hours as needed for headache. Patient not taking: Reported on 04/10/2015 04/02/15 04/01/16  Fayrene Helper, PA-C  clotrimazole (LOTRIMIN) 1 % cream Apply to affected area 2 times daily Patient not taking: Reported on 04/10/2015 04/02/15   Joycie Peek, PA-C  guaiFENesin-dextromethorphan (ROBITUSSIN DM) 100-10 MG/5ML syrup Take 5 mLs by mouth every 4 (four) hours as needed for cough. Patient not taking: Reported on 04/10/2015 03/27/15   Tatyana Kirichenko, PA-C  ibuprofen (ADVIL,MOTRIN) 800 MG tablet Take 1 tablet (800 mg total) by mouth every 8 (eight) hours as needed for mild pain. Patient not taking: Reported on 04/10/2015 03/25/15   Layla Maw Ward, DO  metoCLOPramide (REGLAN) 10 MG tablet Take 1 tablet (10 mg total) by mouth every 6 (six) hours as needed for nausea (or  headache). Patient not taking: Reported on 04/11/2015 03/26/15   Dione Boozeavid Glick, MD  predniSONE (DELTASONE) 20 MG tablet Take 2 tablets (40 mg total) by mouth daily. Patient not taking: Reported on 04/01/2015 03/27/15   Tatyana Kirichenko, PA-C   BP 126/72 mmHg  Pulse 112  Temp(Src) 98 F (36.7 C) (Oral)  Resp 20  SpO2 98% Physical Exam  Constitutional: He is oriented to person,  place, and time. He appears well-developed and well-nourished. No distress.  HENT:  Head: Normocephalic and atraumatic.  Mouth/Throat: Oropharynx is clear and moist. No oropharyngeal exudate.  Eyes: Conjunctivae are normal. Pupils are equal, round, and reactive to light.  Neck: Normal range of motion. Neck supple.  No bruits.  Cardiovascular: Normal rate, regular rhythm and normal heart sounds.  Exam reveals no friction rub.   No murmur heard. Pulmonary/Chest: Effort normal and breath sounds normal. No stridor. No respiratory distress. He has no wheezes. He has no rales.  Abdominal: Soft. He exhibits no distension and no mass. There is no tenderness. There is no rebound and no guarding.  Hyperactive bowel sounds.   Musculoskeletal: Normal range of motion. He exhibits no edema or tenderness.  Neurological: He is alert and oriented to person, place, and time. He displays normal reflexes.  Skin: Skin is warm and dry.  Psychiatric: His behavior is normal.    ED Course  Procedures (including critical care time) DIAGNOSTIC STUDIES: Oxygen Saturation is 98% on RA, normal by my interpretation.    COORDINATION OF CARE: 12:01 AM-Discussed treatment plan which includes CXR and EKGwith pt at bedside and pt agreed to plan.   Labs Review Labs Reviewed  BASIC METABOLIC PANEL  CBC  I-STAT TROPOININ, ED    Imaging Review Dg Chest 2 View  04/10/2015  CLINICAL DATA:  Chest pain and short of breath EXAM: CHEST  2 VIEW COMPARISON:  04/01/2015 FINDINGS: The heart size and mediastinal contours are within normal limits. Both lungs are clear. The visualized skeletal structures are unremarkable. IMPRESSION: No active cardiopulmonary disease. Electronically Signed   By: Marlan Palauharles  Clark M.D.   On: 04/10/2015 21:41   I have personally reviewed and evaluated these images and lab results as part of my medical decision-making.   EKG Interpretation None      EKG Interpretation  Date/Time:  Thursday April 10 2015 20:52:24 EST Ventricular Rate:  99 PR Interval:  145 QRS Duration: 73 QT Interval:  332 QTC Calculation: 426 R Axis:   89 Text Interpretation:  Sinus rhythm Confirmed by [facility]  MD, Hawke Villalpando ([id number]) on 04/11/2015 12:07:07 AM       MDM   Pt was notified of risks and refused to have blood drawn multiple times. Spoke with patient at length about need for blood draw to assess his heart.  He does not want this and only wants to sleep.  rEFUSED ALL INTERVENTION AND will be sent out against medical advice  Final diagnoses:  None    I personally performed the services described in this documentation, which was scribed in my presence. The recorded information has been reviewed and is accurate.      Cy BlamerApril Caide Campi, MD 04/11/15 0020

## 2015-04-15 ENCOUNTER — Ambulatory Visit: Payer: Self-pay | Admitting: Family Medicine

## 2015-04-19 ENCOUNTER — Encounter (HOSPITAL_COMMUNITY): Payer: Self-pay

## 2015-04-19 ENCOUNTER — Emergency Department (HOSPITAL_COMMUNITY)
Admission: EM | Admit: 2015-04-19 | Discharge: 2015-04-19 | Disposition: A | Discharge status: Home / Self Care | Payer: Self-pay | Attending: Emergency Medicine | Admitting: Emergency Medicine

## 2015-04-19 DIAGNOSIS — Z79899 Other long term (current) drug therapy: Secondary | ICD-10-CM | POA: Insufficient documentation

## 2015-04-19 DIAGNOSIS — J45901 Unspecified asthma with (acute) exacerbation: Secondary | ICD-10-CM | POA: Insufficient documentation

## 2015-04-19 DIAGNOSIS — G43909 Migraine, unspecified, not intractable, without status migrainosus: Secondary | ICD-10-CM | POA: Insufficient documentation

## 2015-04-19 DIAGNOSIS — Z8659 Personal history of other mental and behavioral disorders: Secondary | ICD-10-CM | POA: Insufficient documentation

## 2015-04-19 DIAGNOSIS — J45909 Unspecified asthma, uncomplicated: Secondary | ICD-10-CM | POA: Insufficient documentation

## 2015-04-19 DIAGNOSIS — R51 Headache: Secondary | ICD-10-CM | POA: Insufficient documentation

## 2015-04-19 DIAGNOSIS — R519 Headache, unspecified: Secondary | ICD-10-CM

## 2015-04-19 DIAGNOSIS — F17210 Nicotine dependence, cigarettes, uncomplicated: Secondary | ICD-10-CM | POA: Insufficient documentation

## 2015-04-19 DIAGNOSIS — Z590 Homelessness: Secondary | ICD-10-CM | POA: Insufficient documentation

## 2015-04-19 DIAGNOSIS — R062 Wheezing: Secondary | ICD-10-CM

## 2015-04-19 DIAGNOSIS — L608 Other nail disorders: Secondary | ICD-10-CM | POA: Insufficient documentation

## 2015-04-19 MED ORDER — DICYCLOMINE HCL 10 MG PO CAPS
10.0000 mg | ORAL_CAPSULE | Freq: Once | ORAL | Status: AC
  Administered 2015-04-19: 10 mg via ORAL
  Filled 2015-04-19: qty 1

## 2015-04-19 MED ORDER — ACETAMINOPHEN 325 MG PO TABS
650.0000 mg | ORAL_TABLET | Freq: Once | ORAL | Status: AC
  Administered 2015-04-19: 650 mg via ORAL
  Filled 2015-04-19: qty 2

## 2015-04-19 MED ORDER — IPRATROPIUM-ALBUTEROL 0.5-2.5 (3) MG/3ML IN SOLN
3.0000 mL | Freq: Once | RESPIRATORY_TRACT | Status: AC
  Administered 2015-04-19: 3 mL via RESPIRATORY_TRACT
  Filled 2015-04-19: qty 3

## 2015-04-19 NOTE — ED Notes (Addendum)
Pt reports earlier today had wheezing.  Pt c/o chest hurting when takes a deep breath.  No cough, shortness of breath.  Lungs clear.  Pt wants chest xray.

## 2015-04-19 NOTE — Discharge Instructions (Signed)

## 2015-04-19 NOTE — ED Provider Notes (Signed)
CSN: [id number]     Arrival date & time 04/19/15  1948 History   First MD Initiated Contact with Patient 04/19/15 2012     Chief Complaint  Patient presents with  . Wheezing    HPI Comments: 40 year old male presents with complaints of wheezing and inspiratory chest pain for 1 day. He has been seen here in the ED multiple times in the past couple months for various complaints, the most recent being early this morning for a headache which he states has improved. His chest pain is only with inspiration and non-radiating. He reports he associated fatigue and productive cough. Denies fever, chills, headache, ear pain, rhinorhea, congestion, SOB, abdominal pain, nausea, vomiting, or diarrhea. He is homeless and is a current smoker. He is requesting an ACE wrap for his left foot. He has been treated for tinea pedis multiple times in the past without success due to non-adherence.  The history is provided by the patient.    Past Medical History  Diagnosis Date  . Anxiety   . Asthma   . Homelessness   . Migraine    History reviewed. No pertinent past surgical history. History reviewed. No pertinent family history. Social History  Substance Use Topics  . Smoking status: Current Every Day Smoker -- 1.00 packs/day for 0 years    Types: Cigars  . Smokeless tobacco: None  . Alcohol Use: Yes     Comment: couple drinks    Review of Systems  Constitutional: Negative for fever and chills.  HENT: Negative for congestion, ear pain, rhinorrhea and sore throat.   Respiratory: Positive for cough and wheezing. Negative for shortness of breath.   Cardiovascular: Positive for chest pain.  Gastrointestinal: Negative for nausea, vomiting, abdominal pain and diarrhea.    Allergies  Review of patient's allergies indicates no known allergies.  Home Medications   Prior to Admission medications   Medication Sig Start Date End Date Taking? Authorizing Provider  albuterol (PROVENTIL HFA;VENTOLIN HFA) 108  (90 Base) MCG/ACT inhaler Inhale 2 puffs into the lungs every 6 (six) hours as needed for wheezing or shortness of breath.    Historical Provider, MD  butalbital-acetaminophen-caffeine (FIORICET) 50-325-40 MG tablet Take 1-2 tablets by mouth every 6 (six) hours as needed for headache. Patient not taking: Reported on 04/10/2015 04/02/15 04/01/16  Fayrene Helper, PA-C  clotrimazole (LOTRIMIN) 1 % cream Apply to affected area 2 times daily Patient not taking: Reported on 04/10/2015 04/02/15   Joycie Peek, PA-C  guaiFENesin-dextromethorphan (ROBITUSSIN DM) 100-10 MG/5ML syrup Take 5 mLs by mouth every 4 (four) hours as needed for cough. Patient not taking: Reported on 04/10/2015 03/27/15   Tatyana Kirichenko, PA-C  ibuprofen (ADVIL,MOTRIN) 800 MG tablet Take 1 tablet (800 mg total) by mouth every 8 (eight) hours as needed for mild pain. Patient not taking: Reported on 04/10/2015 03/25/15   Layla Maw Ward, DO  metoCLOPramide (REGLAN) 10 MG tablet Take 1 tablet (10 mg total) by mouth every 6 (six) hours as needed for nausea (or headache). Patient not taking: Reported on 04/11/2015 03/26/15   Dione Booze, MD  predniSONE (DELTASONE) 20 MG tablet Take 2 tablets (40 mg total) by mouth daily. Patient not taking: Reported on 04/01/2015 03/27/15   Tatyana Kirichenko, PA-C   BP 162/86 mmHg  Pulse 81  Temp(Src) 98 F (36.7 C) (Oral)  Resp 16  SpO2 100%   Physical Exam  Constitutional: He is oriented to person, place, and time. He appears well-developed and well-nourished. No distress.  HENT:  Head: Normocephalic and atraumatic.  Right Ear: Tympanic membrane, external ear and ear canal normal.  Left Ear: Tympanic membrane, external ear and ear canal normal.  Nose: No mucosal edema or rhinorrhea.  Mouth/Throat: Uvula is midline, oropharynx is clear and moist and mucous membranes are normal.  Eyes: Conjunctivae are normal. Pupils are equal, round, and reactive to light. Right eye exhibits no discharge. Left eye exhibits no  discharge. No scleral icterus.  Cardiovascular: Normal rate and regular rhythm.  Exam reveals no gallop and no friction rub.   No murmur heard. Pulmonary/Chest: Effort normal. No respiratory distress. He has no wheezes. He has no rales. He exhibits tenderness.  TTP of central anterior chest wall  Neurological: He is alert and oriented to person, place, and time.  Skin: Skin is warm and dry.  Macerated L toes with discoloration of nails  Psychiatric: He has a normal mood and affect.    ED Course  Procedures (including critical care time)    EKG Interpretation   Date/Time:  Saturday April 19 2015 20:41:09 EDT Ventricular Rate:  93 PR Interval:  142 QRS Duration: 94 QT Interval:  366 QTC Calculation: 455 R Axis:   94 Text Interpretation:  Normal sinus rhythm with sinus arrhythmia Rightward  axis Minimal voltage criteria for LVH, may be normal variant Borderline  ECG No significant change since last tracing Confirmed by ZACKOWSKI  MD,  SCOTT [id number]) on 04/19/2015 8:43:47 PM      MDM   Final diagnoses:  Wheezing   Dustin Norris is a 40 y.o. male with a history of homelessness who is well-known to the ED presents with multiple medical complaints. He was not wheezing or coughing on exam however after administration of Duoneb he reported a subjective improvement of symptoms.  Discussed with pt why CXR was not needed since he is afebrile and exam is not concerning for PNA. Chest pain is inspiratory and reproducible with palpation and EKG obtained was reassuring therefore it is unlikely his symptoms are cardiac.  Patient is requesting Ace wrap for his tinea pedis which was applied for comfort. Exam is not concerning for cellulitis.   The patient appears reasonably screened and/or stabilized for discharge and I doubt any other medical condition or other [facility] requiring further screening, evaluation, or treatment in the ED at this time prior to discharge.    Bethel BornKelly Marie Gekas,  PA-C 04/19/15 [id number]  [name], MD 04/24/15 [phone]

## 2015-04-19 NOTE — ED Provider Notes (Signed)
CSN: [id number]     Arrival date & time 04/19/15  0049 History   First MD Initiated Contact with Patient 04/19/15 0404     Chief Complaint  Patient presents with  . Headache     (Consider location/radiation/quality/duration/timing/severity/associated sxs/prior Treatment) HPI Comments: 40 year old male well-known to the emergency department presents to the ED for complaints of headache. Headache began yesterday morning. Patient states that he has pain "all over" which feels similar to his prior headaches. He denies taking any medications for his symptoms. Pain has been constant without modifying factors. Patient has fever, syncope, vision changes or vision loss, tinnitus or hearing loss, or extremity numbness/weakness. Patient initially complaining of some abdominal pain in triage, though he states that he has no abdominal pain now after having a few bowel movements.  The history is provided by the patient. No language interpreter was used.    Past Medical History  Diagnosis Date  . Anxiety   . Asthma   . Homelessness   . Migraine    History reviewed. No pertinent past surgical history. History reviewed. No pertinent family history. Social History  Substance Use Topics  . Smoking status: Current Every Day Smoker -- 1.00 packs/day for 0 years    Types: Cigarettes  . Smokeless tobacco: None  . Alcohol Use: Yes     Comment: couple drinks    Review of Systems  Constitutional: Negative for fever.  Eyes: Negative for visual disturbance.  Gastrointestinal: Negative for nausea and vomiting.  Musculoskeletal: Negative for neck pain.  Neurological: Positive for headaches. Negative for syncope.  All other systems reviewed and are negative.   Allergies  Review of patient's allergies indicates no known allergies.  Home Medications   Prior to Admission medications   Medication Sig Start Date End Date Taking? Authorizing Provider  albuterol (PROVENTIL HFA;VENTOLIN HFA) 108 (90 Base)  MCG/ACT inhaler Inhale 2 puffs into the lungs every 6 (six) hours as needed for wheezing or shortness of breath.   Yes Historical Provider, MD  butalbital-acetaminophen-caffeine (FIORICET) 50-325-40 MG tablet Take 1-2 tablets by mouth every 6 (six) hours as needed for headache. Patient not taking: Reported on 04/10/2015 04/02/15 04/01/16  Fayrene Helper, PA-C  clotrimazole (LOTRIMIN) 1 % cream Apply to affected area 2 times daily Patient not taking: Reported on 04/10/2015 04/02/15   Joycie Peek, PA-C  guaiFENesin-dextromethorphan (ROBITUSSIN DM) 100-10 MG/5ML syrup Take 5 mLs by mouth every 4 (four) hours as needed for cough. Patient not taking: Reported on 04/10/2015 03/27/15   Tatyana Kirichenko, PA-C  ibuprofen (ADVIL,MOTRIN) 800 MG tablet Take 1 tablet (800 mg total) by mouth every 8 (eight) hours as needed for mild pain. Patient not taking: Reported on 04/10/2015 03/25/15   Layla Maw Ward, DO  metoCLOPramide (REGLAN) 10 MG tablet Take 1 tablet (10 mg total) by mouth every 6 (six) hours as needed for nausea (or headache). Patient not taking: Reported on 04/11/2015 03/26/15   Dione Booze, MD  predniSONE (DELTASONE) 20 MG tablet Take 2 tablets (40 mg total) by mouth daily. Patient not taking: Reported on 04/01/2015 03/27/15   Tatyana Kirichenko, PA-C   BP 120/71 mmHg  Pulse 70  Temp(Src) 98.4 F (36.9 C) (Oral)  Resp 14  SpO2 100%   Physical Exam  Constitutional: He is oriented to person, place, and time. He appears well-developed and well-nourished. No distress.  Nontoxic/nonseptic appearing  HENT:  Head: Normocephalic and atraumatic.  Mouth/Throat: Oropharynx is clear and moist. No oropharyngeal exudate.  Symmetric rise of the uvula  with phonation  Eyes: Conjunctivae and EOM are normal. Pupils are equal, round, and reactive to light. No scleral icterus.  Neck: Normal range of motion.  Cardiovascular: Normal rate, regular rhythm and intact distal pulses.   Pulmonary/Chest: Effort normal. No  respiratory distress.  Respirations even and unlabored  Musculoskeletal: Normal range of motion.  Neurological: He is alert and oriented to person, place, and time. No cranial nerve deficit. He exhibits normal muscle tone. Coordination normal.  GCS 15. Patient with goal oriented speech. No focal neurologic deficits appreciated. Patient moving all extremities. Grip strength 5/5 bilaterally.  Skin: Skin is warm and dry. No rash noted. He is not diaphoretic. No erythema. No pallor.  Psychiatric: He has a normal mood and affect. His behavior is normal.  Nursing note and vitals reviewed.   ED Course  Procedures (including critical care time) Labs Review Labs Reviewed - No data to display  Imaging Review No results found.   I have personally reviewed and evaluated these images and lab results as part of my medical decision-making.   EKG Interpretation None      MDM   Final diagnoses:  Acute nonintractable headache, unspecified headache type    40 year old male presents to the emergency department for complaints of headache. He has presented to the ED on multiple prior occasions for similar symptoms today. Patient is afebrile with a nonfocal neurologic exam. Doubt emergent cause of headache. Symptoms managed with Tylenol; patient declines an IV or IM medications. Patient also requesting the ability to have "some time to sleep". I have explained that we are unable to accommodate this due to high volume. He will be referred to his primary care doctor for follow-up. Patient discharged in good condition with no unaddressed concerns.   Filed Vitals:   04/19/15 0049 04/19/15 0129 04/19/15 0513  BP:  140/78 120/71  Pulse:  102 70  Temp:  98.4 F (36.9 C)   TempSrc:  Oral   Resp:  16 14  SpO2: 99% 100% 100%     Antony MaduraKelly Nahjae Hoeg, PA-C 04/19/15 [id number]  Geoffery Lyonsouglas Delo, MD 04/19/15 [phone]

## 2015-04-19 NOTE — ED Notes (Signed)
Pt c/o low abd pain onset 5 hours after consuming etoh and eating meal. Denies n/v/d.

## 2015-04-19 NOTE — Discharge Instructions (Signed)

## 2015-04-19 NOTE — ED Notes (Signed)
At the end of triage patient stated that his abdomen did not hurt anymore.  Just wanted to be seen for a headache.  Disregard abdominal pain assessment

## 2015-04-19 NOTE — ED Notes (Signed)
States he had some etoh and complains of a headache.  Rated at a 10/10

## 2015-04-26 ENCOUNTER — Emergency Department (HOSPITAL_COMMUNITY)
Admission: EM | Admit: 2015-04-26 | Discharge: 2015-04-26 | Disposition: A | Discharge status: Home / Self Care | Payer: Self-pay | Attending: Emergency Medicine | Admitting: Emergency Medicine

## 2015-04-26 ENCOUNTER — Encounter (HOSPITAL_COMMUNITY): Payer: Self-pay | Admitting: Oncology

## 2015-04-26 DIAGNOSIS — R519 Headache, unspecified: Secondary | ICD-10-CM

## 2015-04-26 DIAGNOSIS — R51 Headache: Secondary | ICD-10-CM | POA: Insufficient documentation

## 2015-04-26 DIAGNOSIS — Z79899 Other long term (current) drug therapy: Secondary | ICD-10-CM | POA: Insufficient documentation

## 2015-04-26 DIAGNOSIS — Z760 Encounter for issue of repeat prescription: Secondary | ICD-10-CM | POA: Insufficient documentation

## 2015-04-26 DIAGNOSIS — J45909 Unspecified asthma, uncomplicated: Secondary | ICD-10-CM | POA: Insufficient documentation

## 2015-04-26 DIAGNOSIS — F17210 Nicotine dependence, cigarettes, uncomplicated: Secondary | ICD-10-CM | POA: Insufficient documentation

## 2015-04-26 DIAGNOSIS — Z8659 Personal history of other mental and behavioral disorders: Secondary | ICD-10-CM | POA: Insufficient documentation

## 2015-04-26 DIAGNOSIS — R079 Chest pain, unspecified: Secondary | ICD-10-CM | POA: Insufficient documentation

## 2015-04-26 DIAGNOSIS — Z8679 Personal history of other diseases of the circulatory system: Secondary | ICD-10-CM | POA: Insufficient documentation

## 2015-04-26 DIAGNOSIS — Z590 Homelessness: Secondary | ICD-10-CM | POA: Insufficient documentation

## 2015-04-26 DIAGNOSIS — R42 Dizziness and giddiness: Secondary | ICD-10-CM | POA: Insufficient documentation

## 2015-04-26 MED ORDER — ACETAMINOPHEN 325 MG PO TABS
650.0000 mg | ORAL_TABLET | Freq: Once | ORAL | Status: AC
  Administered 2015-04-26: 650 mg via ORAL
  Filled 2015-04-26: qty 2

## 2015-04-26 NOTE — ED Provider Notes (Signed)
CSN: [id number]     Arrival date & time 04/26/15  2117 History   By signing my name below, I, [facility]tr[facility]arr[facility] Washington, attest that this documentation has been prepared under the direction and in the presence of Dezzie Badilla A Seamus Warehime, PA-C. Electronically Signed: Randell PatientMarrissa Washington, ED Scribe. 04/26/2015. 10:13 PM.   Chief Complaint  Patient presents with  . Medication Refill    The history is provided by the patient. No language interpreter was used.   HPI Comments: Dustin Norris is a 40 y.o. male with an hx of asthma who presents to the Emergency Department complaining of a constant, mild, recurrent HAs onset earlier today. Patient reports that he came to the ED today for a refill of his inhaler but that he has felt dizzy and is complaining of CP and general malaise since arrival. He notes that HA normally presents after he has difficulty breathing that he associates with his asthma.  He takes Tylenol for his HA and last took this medication earlier today. He has not PCP currently. Denies wheezing.  Past Medical History  Diagnosis Date  . Anxiety   . Asthma   . Homelessness   . Migraine    History reviewed. No pertinent past surgical history. History reviewed. No pertinent family history. Social History  Substance Use Topics  . Smoking status: Current Every Day Smoker -- 1.00 packs/day for 0 years    Types: Cigars  . Smokeless tobacco: None  . Alcohol Use: Yes     Comment: couple drinks    Review of Systems  Constitutional: Negative for fever.  HENT: Negative for congestion.   Respiratory: Negative for wheezing.   Cardiovascular: Positive for chest pain.  Gastrointestinal: Negative for nausea, vomiting and abdominal pain.  Musculoskeletal: Negative for myalgias and neck stiffness.  Skin: Negative for rash.  Neurological: Positive for dizziness and headaches.      Allergies  Review of patient's allergies indicates no known allergies.  Home Medications   Prior to Admission  medications   Medication Sig Start Date End Date Taking? Authorizing Provider  albuterol (PROVENTIL HFA;VENTOLIN HFA) 108 (90 Base) MCG/ACT inhaler Inhale 2 puffs into the lungs every 6 (six) hours as needed for wheezing or shortness of breath.    Historical Provider, MD  butalbital-acetaminophen-caffeine (FIORICET) 50-325-40 MG tablet Take 1-2 tablets by mouth every 6 (six) hours as needed for headache. Patient not taking: Reported on 04/10/2015 04/02/15 04/01/16  [name], PA-C  clotrimazole (LOTRIMIN) 1 % cream Apply to affected area 2 times daily Patient not taking: Reported on 04/10/2015 04/02/15   Joycie PeekBenjamin Cartner, PA-C  guaiFENesin-dextromethorphan (ROBITUSSIN DM) 100-10 MG/5ML syrup Take 5 mLs by mouth every 4 (four) hours as needed for cough. Patient not taking: Reported on 04/10/2015 03/27/15   Tatyana Kirichenko, PA-C  ibuprofen (ADVIL,MOTRIN) 800 MG tablet Take 1 tablet (800 mg total) by mouth every 8 (eight) hours as needed for mild pain. Patient not taking: Reported on 04/10/2015 03/25/15   Layla MawKristen N Ward, DO  metoCLOPramide (REGLAN) 10 MG tablet Take 1 tablet (10 mg total) by mouth every 6 (six) hours as needed for nausea (or headache). Patient not taking: Reported on 04/11/2015 03/26/15   Dione Boozeavid Glick, MD  predniSONE (DELTASONE) 20 MG tablet Take 2 tablets (40 mg total) by mouth daily. Patient not taking: Reported on 04/01/2015 03/27/15   Tatyana Kirichenko, PA-C   BP 158/91 mmHg  Pulse 80  Temp(Src) 98.5 F (36.9 C) (Oral)  Resp 18  SpO2 100% Physical Exam  Constitutional:  He is oriented to person, place, and time. He appears well-developed and well-nourished. No distress.  HENT:  Head: Normocephalic and atraumatic.  Eyes: Conjunctivae and EOM are normal. Pupils are equal, round, and reactive to light.  Neck: Neck supple. No tracheal deviation present.  Cardiovascular: Normal rate.   Pulmonary/Chest: Effort normal. No respiratory distress. He has no wheezes. He has no rales.  Abdominal:  There is no tenderness.  Musculoskeletal: Normal range of motion.  Neurological: He is alert and oriented to person, place, and time. Coordination normal.  CN's 3-12 grossly intact. He is ambulatory without imbalance. No deficits of coordination. Speech is clear and focused.  Skin: Skin is warm and dry.  Psychiatric: He has a normal mood and affect. His behavior is normal.  Nursing note and vitals reviewed.   ED Course  Procedures   DIAGNOSTIC STUDIES: Oxygen Saturation is 100% on RA, normal by my interpretation.    COORDINATION OF CARE: 10:07 PM Will order Tylenol. Will provide pt with referral list of PCPs. Discussed treatment plan with pt at bedside and pt agreed to plan.   MDM   Final diagnoses:  None    1. Nonspecific headache 2. History of asthma  The patient presents via triage notes with request for inhaler due to wheezing. He reports headache is currently his only complaint. Chart reviewed. The patient has been seen multiple times for headache. He has no neurologic deficits tonight and appears in NAD. Tylenol provided for headache. He is felt stable for discharge and can follow up outpatient.   I personally performed the services described in this documentation, which was scribed in my presence. The recorded information has been reviewed and is accurate.     Elpidio Anis, PA-C 04/30/15 0018  Lorre Nick, MD 05/01/15 [phone]

## 2015-04-26 NOTE — ED Notes (Signed)
Patient states that he needs his inhaler refilled for his asthma.

## 2015-04-26 NOTE — Discharge Instructions (Signed)

## 2015-05-30 ENCOUNTER — Encounter (HOSPITAL_COMMUNITY): Payer: Self-pay | Admitting: Emergency Medicine

## 2015-05-30 ENCOUNTER — Emergency Department (HOSPITAL_COMMUNITY)
Admission: EM | Admit: 2015-05-30 | Discharge: 2015-05-31 | Discharge status: Against Medical Advice | Payer: Self-pay | Attending: Emergency Medicine | Admitting: Emergency Medicine

## 2015-05-30 DIAGNOSIS — Z8659 Personal history of other mental and behavioral disorders: Secondary | ICD-10-CM | POA: Insufficient documentation

## 2015-05-30 DIAGNOSIS — Z79899 Other long term (current) drug therapy: Secondary | ICD-10-CM | POA: Insufficient documentation

## 2015-05-30 DIAGNOSIS — R079 Chest pain, unspecified: Secondary | ICD-10-CM | POA: Insufficient documentation

## 2015-05-30 DIAGNOSIS — Z8679 Personal history of other diseases of the circulatory system: Secondary | ICD-10-CM | POA: Insufficient documentation

## 2015-05-30 DIAGNOSIS — Z590 Homelessness: Secondary | ICD-10-CM | POA: Insufficient documentation

## 2015-05-30 DIAGNOSIS — F17210 Nicotine dependence, cigarettes, uncomplicated: Secondary | ICD-10-CM | POA: Insufficient documentation

## 2015-05-30 DIAGNOSIS — J45901 Unspecified asthma with (acute) exacerbation: Secondary | ICD-10-CM | POA: Insufficient documentation

## 2015-05-30 NOTE — ED Notes (Addendum)
Pt transported from homeless [facility] for c/o shortness of breath, LCTA, NAD Pt c/o HA and chest pain onset today. Denies n/v/d, [facility]

## 2015-05-31 ENCOUNTER — Emergency Department (HOSPITAL_COMMUNITY)
Admission: EM | Admit: 2015-05-31 | Discharge: 2015-05-31 | Disposition: A | Discharge status: Home / Self Care | Payer: Self-pay | Attending: Emergency Medicine | Admitting: Emergency Medicine

## 2015-05-31 ENCOUNTER — Encounter (HOSPITAL_COMMUNITY): Payer: Self-pay

## 2015-05-31 DIAGNOSIS — F17290 Nicotine dependence, other tobacco product, uncomplicated: Secondary | ICD-10-CM | POA: Insufficient documentation

## 2015-05-31 DIAGNOSIS — J45909 Unspecified asthma, uncomplicated: Secondary | ICD-10-CM | POA: Insufficient documentation

## 2015-05-31 DIAGNOSIS — R519 Headache, unspecified: Secondary | ICD-10-CM

## 2015-05-31 DIAGNOSIS — Z7952 Long term (current) use of systemic steroids: Secondary | ICD-10-CM | POA: Insufficient documentation

## 2015-05-31 DIAGNOSIS — R51 Headache: Secondary | ICD-10-CM | POA: Insufficient documentation

## 2015-05-31 DIAGNOSIS — Z79899 Other long term (current) drug therapy: Secondary | ICD-10-CM | POA: Insufficient documentation

## 2015-05-31 DIAGNOSIS — Z7951 Long term (current) use of inhaled steroids: Secondary | ICD-10-CM | POA: Insufficient documentation

## 2015-05-31 MED ORDER — IBUPROFEN 100 MG/5ML PO SUSP
400.0000 mg | Freq: Once | ORAL | Status: AC
  Administered 2015-05-31: 400 mg via ORAL
  Filled 2015-05-31: qty 20

## 2015-05-31 NOTE — Discharge Instructions (Signed)
Nonspecific Chest Pain  °Chest pain can be caused by many different conditions. There is always a chance that your pain could be related to something serious, such as a heart attack or a blood clot in your lungs. Chest pain can also be caused by conditions that are not life-threatening. If you have chest pain, it is very important to follow up with your health care provider. °CAUSES  °Chest pain can be caused by: °· Heartburn. °· Pneumonia or bronchitis. °· Anxiety or stress. °· Inflammation around your heart (pericarditis) or lung (pleuritis or pleurisy). °· A blood clot in your lung. °· A collapsed lung (pneumothorax). It can develop suddenly on its own (spontaneous pneumothorax) or from trauma to the chest. °· Shingles infection (varicella-zoster virus). °· Heart attack. °· Damage to the bones, muscles, and cartilage that make up your chest wall. This can include: °¨ Bruised bones due to injury. °¨ Strained muscles or cartilage due to frequent or repeated coughing or overwork. °¨ Fracture to one or more ribs. °¨ Sore cartilage due to inflammation (costochondritis). °RISK FACTORS  °Risk factors for chest pain may include: °· Activities that increase your risk for trauma or injury to your chest. °· Respiratory infections or conditions that cause frequent coughing. °· Medical conditions or overeating that can cause heartburn. °· Heart disease or family history of heart disease. °· Conditions or health behaviors that increase your risk of developing a blood clot. °· Having had chicken pox (varicella zoster). °SIGNS AND SYMPTOMS °Chest pain can feel like: °· Burning or tingling on the surface of your chest or deep in your chest. °· Crushing, pressure, aching, or squeezing pain. °· Dull or sharp pain that is worse when you move, cough, or take a deep breath. °· Pain that is also felt in your back, neck, shoulder, or arm, or pain that spreads to any of these areas. °Your chest pain may come and go, or it may stay  constant. °DIAGNOSIS °Lab tests or other studies may be needed to find the cause of your pain. Your health care provider may have you take a test called an ambulatory ECG (electrocardiogram). An ECG records your heartbeat patterns at the time the test is performed. You may also have other tests, such as: °· Transthoracic echocardiogram (TTE). During echocardiography, sound waves are used to create a picture of all of the heart structures and to look at how blood flows through your heart. °· Transesophageal echocardiogram (TEE). This is a more advanced imaging test that obtains images from inside your body. It allows your health care provider to see your heart in finer detail. °· Cardiac monitoring. This allows your health care provider to monitor your heart rate and rhythm in real time. °· Holter monitor. This is a portable device that records your heartbeat and can help to diagnose abnormal heartbeats. It allows your health care provider to track your heart activity for several days, if needed. °· Stress tests. These can be done through exercise or by taking medicine that makes your heart beat more quickly. °· Blood tests. °· Imaging tests. °TREATMENT  °Your treatment depends on what is causing your chest pain. Treatment may include: °· Medicines. These may include: °¨ Acid blockers for heartburn. °¨ Anti-inflammatory medicine. °¨ Pain medicine for inflammatory conditions. °¨ Antibiotic medicine, if an infection is present. °¨ Medicines to dissolve blood clots. °¨ Medicines to treat coronary artery disease. °· Supportive care for conditions that do not require medicines. This may include: °¨ Resting. °¨ Applying heat   or cold packs to injured areas. °¨ Limiting activities until pain decreases. °HOME CARE INSTRUCTIONS °· If you were prescribed an antibiotic medicine, finish it all even if you start to feel better. °· Avoid any activities that bring on chest pain. °· Do not use any tobacco products, including  cigarettes, chewing tobacco, or electronic cigarettes. If you need help quitting, ask your health care provider. °· Do not drink alcohol. °· Take medicines only as directed by your health care provider. °· Keep all follow-up visits as directed by your health care provider. This is important. This includes any further testing if your chest pain does not go away. °· If heartburn is the cause for your chest pain, you may be told to keep your head raised (elevated) while sleeping. This reduces the chance that acid will go from your stomach into your esophagus. °· Make lifestyle changes as directed by your health care provider. These may include: °¨ Getting regular exercise. Ask your health care provider to suggest some activities that are safe for you. °¨ Eating a heart-healthy diet. A registered dietitian can help you to learn healthy eating options. °¨ Maintaining a healthy weight. °¨ Managing diabetes, if necessary. °¨ Reducing stress. °SEEK MEDICAL CARE IF: °· Your chest pain does not go away after treatment. °· You have a rash with blisters on your chest. °· You have a fever. °SEEK IMMEDIATE MEDICAL CARE IF:  °· Your chest pain is worse. °· You have an increasing cough, or you cough up blood. °· You have severe abdominal pain. °· You have severe weakness. °· You faint. °· You have chills. °· You have sudden, unexplained chest discomfort. °· You have sudden, unexplained discomfort in your arms, back, neck, or jaw. °· You have shortness of breath at any time. °· You suddenly start to sweat, or your skin gets clammy. °· You feel nauseous or you vomit. °· You suddenly feel light-headed or dizzy. °· Your heart begins to beat quickly, or it feels like it is skipping beats. °These symptoms may represent a serious problem that is an emergency. Do not wait to see if the symptoms will go away. Get medical help right away. Call your local emergency services (911 in the [place] Do not drive yourself to the hospital. °  °This  information is not intended to replace advice given to you by your health care provider. Make sure you discuss any questions you have with your health care provider. °  °Document Released: 10/28/2004 Document Revised: 02/08/2014 Document Reviewed: 08/24/2013 °Elsevier Interactive Patient Education ©2016 [employer] ° °

## 2015-05-31 NOTE — ED Provider Notes (Signed)
CSN: [id number]     Arrival date & time 05/31/15  1732 History  By signing my name below, I, Phillis Haggis, attest that this documentation has been prepared under the direction and in the presence of [employer], PA-C. Electronically Signed: Phillis Haggis, ED Scribe. 05/31/2015. 6:15 PM.   Chief Complaint  Patient presents with  . Facial Pain   The history is provided by the patient. No language interpreter was used.  HPI Comments: Dustin Norris is a 40 y.o. Male with a hx of multiple ED visits, migraine, and homelessness who presents to the Emergency Department complaining of intermittent, pressured left sided headache and facial pain onset earlier today. He reports a hx of sinusitis and believes that it might be the same. He reports hx of similar migraines. 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 He has not taken anything for his symptoms. He denies fever, chills, nausea, or vomiting.   Past Medical History  Diagnosis Date  . Anxiety   . Asthma   . Homelessness   . Migraine    No past surgical history on file. No family history on file. Social History  Substance Use Topics  . Smoking status: Current Every Day Smoker -- 1.00 packs/day for 0 years    Types: Cigars  . Smokeless tobacco: None  . Alcohol Use: Yes     Comment: couple drinks    Review of Systems  10 Systems reviewed and all are negative for acute change except as noted in the HPI.  Allergies  Review of patient's allergies indicates no known allergies.  Home Medications   Prior to Admission medications   Medication Sig Start Date End Date Taking? Authorizing Provider  albuterol (PROVENTIL HFA;VENTOLIN HFA) 108 (90 Base) MCG/ACT inhaler Inhale 2 puffs into the lungs every 6 (six) hours as needed for wheezing or shortness of breath.    Historical Provider, MD  butalbital-acetaminophen-caffeine (FIORICET) 50-325-40 MG tablet Take 1-2 tablets by mouth every 6 (six)  hours as needed for headache. Patient not taking: Reported on 04/10/2015 04/02/15 04/01/16  Fayrene Helper, PA-C  clotrimazole (LOTRIMIN) 1 % cream Apply to affected area 2 times daily Patient not taking: Reported on 04/10/2015 04/02/15   Joycie Peek, PA-C  guaiFENesin-dextromethorphan (ROBITUSSIN DM) 100-10 MG/5ML syrup Take 5 mLs by mouth every 4 (four) hours as needed for cough. Patient not taking: Reported on 04/10/2015 03/27/15   Tatyana Kirichenko, PA-C  ibuprofen (ADVIL,MOTRIN) 800 MG tablet Take 1 tablet (800 mg total) by mouth every 8 (eight) hours as needed for mild pain. Patient not taking: Reported on 04/10/2015 03/25/15   Layla Maw Ward, DO  metoCLOPramide (REGLAN) 10 MG tablet Take 1 tablet (10 mg total) by mouth every 6 (six) hours as needed for nausea (or headache). Patient not taking: Reported on 04/11/2015 03/26/15   Dione Booze, MD  predniSONE (DELTASONE) 20 MG tablet Take 2 tablets (40 mg total) by mouth daily. Patient not taking: Reported on 04/01/2015 03/27/15   Tatyana Kirichenko, PA-C   BP 156/86 mmHg  Pulse 96  Temp(Src) 97.9 F (36.6 C) (Oral)  Resp 18  Ht  (1.702 m)  Wt 155 lb (70.308 kg)  BMI 24.27 kg/m2  SpO2 100% Physical Exam  Constitutional: He is oriented to person, place, and time. He appears well-developed and well-nourished.  HENT:  Head: Normocephalic and atraumatic.  Right Ear: External ear normal.  Left Ear: External ear normal.  Mouth/Throat: Oropharynx is clear and moist. No  oropharyngeal exudate.  No drooling or stridor. Posterior pharynx mildly erythematous no significant tonsillar hypertrophy. No exudate. Soft palate rises symmetrically. No TTP or induration under tongue.   No tenderness to palpation of frontal or bilateral maxillary sinuses.  Mild mucosal edema in the nares with scant rhinorrhea.  Bilateral tympanic membranes with normal architecture and good light reflex.    Eyes: Conjunctivae and EOM are normal. Pupils are equal, round, and  reactive to light.  No TTP of maxillary or frontal sinuses  No TTP or induration of temporal arteries bilaterally  Neck: Normal range of motion. Neck supple.  FROM to C-spine. Pt can touch chin to chest without discomfort. No TTP of midline cervical spine.   Cardiovascular: Normal rate, regular rhythm and intact distal pulses.   Pulmonary/Chest: Effort normal and breath sounds normal. No stridor. No respiratory distress. He has no wheezes. He has no rales. He exhibits no tenderness.  Abdominal: Soft. Bowel sounds are normal. There is no tenderness. There is no rebound and no guarding.  Musculoskeletal: Normal range of motion. He exhibits no edema or tenderness.  Neurological: He is alert and oriented to person, place, and time. No cranial nerve deficit.  II-Visual fields grossly intact. III/IV/VI-Extraocular movements intact.  Pupils reactive bilaterally. V/VII-Smile symmetric, equal eyebrow raise,  facial sensation intact VIII- Hearing grossly intact IX/X-Normal gag XI-bilateral shoulder shrug XII-midline tongue extension Motor: 5/5 bilaterally with normal tone and bulk Cerebellar: Normal finger-to-nose  and normal heel-to-shin test.   Romberg negative Ambulates with a coordinated gait   Skin: Skin is warm and dry.  Psychiatric: He has a normal mood and affect. His behavior is normal.  Nursing note and vitals reviewed.   ED Course  Procedures (including critical care time) DIAGNOSTIC STUDIES: Oxygen Saturation is 100% on RA, normal by my interpretation.    COORDINATION OF CARE: 6:15 PM-Discussed treatment plan which includes headache medication with pt at bedside and pt agreed to plan.    Labs Review Labs Reviewed - No data to display  Imaging Review No results found. I have personally reviewed and evaluated these images and lab results as part of my medical decision-making.   EKG Interpretation None      MDM   Final diagnoses:  Nonintractable headache,  unspecified chronicity pattern, unspecified headache type    Filed Vitals:   05/31/15 1747 05/31/15 1851  BP: 156/86   Pulse: 96 85  Temp: 97.9 F (36.6 C)   TempSrc: Oral   Resp: 18   Height: 5\' 7"  (1.702 m)   Weight: 70.308 kg   SpO2: 100% 100%    Medications  ibuprofen (ADVIL,MOTRIN) 100 MG/5ML suspension 400 mg (400 mg Oral Given 05/31/15 1837)    Dustin Norris is 40 y.o. male presenting with left-sided headache, physical exam is not consistent with sinusitis, states that this is typical for his chronic headache exacerbation. Neuro exam is nonfocal. Patient declines IM pain medication, states he cannot take pills, ibuprofen suspension given with good relief.  Evaluation does not show pathology that would require ongoing emergent intervention or inpatient treatment. Pt is hemodynamically stable and mentating appropriately. Discussed findings and plan with patient/guardian, who agrees with care plan. All questions answered. Return precautions discussed and outpatient follow up given.    I personally performed the services described in this documentation, which was scribed in my presence. The recorded information has been reviewed and is accurate.   Wynetta Emeryicole Frans Valente, PA-C 05/31/15 1946  Azalia BilisKevin Campos, MD 06/01/15 [phone]

## 2015-05-31 NOTE — ED Notes (Signed)
He states he has facial pain and pressure.  He states he has hx of sinusitis and theorizes it may be "time for an antibiotic like they gave me before".  He is alert and oriented x 4 with clear speech.

## 2015-05-31 NOTE — ED Notes (Signed)
Pt refusing blood draw at this time, shari, pa made aware of the same.

## 2015-05-31 NOTE — ED Provider Notes (Signed)
CSN: [id number]     Arrival date & time 05/30/15  2236 History   First MD Initiated Contact with Patient 05/31/15 0200     Chief Complaint  Patient presents with  . Shortness of Breath     (Consider location/radiation/quality/duration/timing/severity/associated sxs/prior Treatment) Patient is a 40 y.o. male presenting with shortness of breath. The history is provided by the patient. No language interpreter was used.  Shortness of Breath Severity:  Moderate Onset quality:  Gradual Associated symptoms: chest pain   Associated symptoms: no abdominal pain, no fever and no vomiting   Associated symptoms comment:  Patient well known to the ED presents with complaint of chest pain that has been intermittent since yesterday. He reports SOB also. No cough or fever. He was lifting heavy objects yesterday but denies that the pain is affected by movement or palpation. No nausea or vomiting. He reports the pain is intermittent with no aggravating or alleviating factors.    Past Medical History  Diagnosis Date  . Anxiety   . Asthma   . Homelessness   . Migraine    History reviewed. No pertinent past surgical history. No family history on file. Social History  Substance Use Topics  . Smoking status: Current Every Day Smoker -- 1.00 packs/day for 0 years    Types: Cigars  . Smokeless tobacco: None  . Alcohol Use: Yes     Comment: couple drinks    Review of Systems  Constitutional: Negative for fever and chills.  HENT: Negative.   Respiratory: Positive for shortness of breath.   Cardiovascular: Positive for chest pain.  Gastrointestinal: Negative.  Negative for vomiting and abdominal pain.  Musculoskeletal: Negative.   Skin: Negative.   Neurological: Negative.       Allergies  Review of patient's allergies indicates no known allergies.  Home Medications   Prior to Admission medications   Medication Sig Start Date End Date Taking? Authorizing Provider  albuterol (PROVENTIL  HFA;VENTOLIN HFA) 108 (90 Base) MCG/ACT inhaler Inhale 2 puffs into the lungs every 6 (six) hours as needed for wheezing or shortness of breath.   Yes Historical Provider, MD  butalbital-acetaminophen-caffeine (FIORICET) 50-325-40 MG tablet Take 1-2 tablets by mouth every 6 (six) hours as needed for headache. Patient not taking: Reported on 04/10/2015 04/02/15 04/01/16  [name], PA-C  clotrimazole (LOTRIMIN) 1 % cream Apply to affected area 2 times daily Patient not taking: Reported on 04/10/2015 04/02/15   Joycie PeekBenjamin Cartner, PA-C  guaiFENesin-dextromethorphan (ROBITUSSIN DM) 100-10 MG/5ML syrup Take 5 mLs by mouth every 4 (four) hours as needed for cough. Patient not taking: Reported on 04/10/2015 03/27/15   Tatyana Kirichenko, PA-C  ibuprofen (ADVIL,MOTRIN) 800 MG tablet Take 1 tablet (800 mg total) by mouth every 8 (eight) hours as needed for mild pain. Patient not taking: Reported on 04/10/2015 03/25/15   Layla MawKristen N Ward, DO  metoCLOPramide (REGLAN) 10 MG tablet Take 1 tablet (10 mg total) by mouth every 6 (six) hours as needed for nausea (or headache). Patient not taking: Reported on 04/11/2015 03/26/15   Dione Boozeavid Glick, MD  predniSONE (DELTASONE) 20 MG tablet Take 2 tablets (40 mg total) by mouth daily. Patient not taking: Reported on 04/01/2015 03/27/15   Tatyana Kirichenko, PA-C   BP 133/111 mmHg  Pulse 69  Temp(Src) 97.3 F (36.3 C) (Oral)  Resp 19  SpO2 97% Physical Exam  Constitutional: He is oriented to person, place, and time. He appears well-developed and well-nourished.  HENT:  Head: Normocephalic.  Neck: Normal range  of motion. Neck supple.  Cardiovascular: Normal rate and regular rhythm.   Pulmonary/Chest: Effort normal and breath sounds normal. He has no wheezes. He has no rales. He exhibits no tenderness.  Abdominal: Soft. Bowel sounds are normal. There is no tenderness. There is no rebound and no guarding.  Musculoskeletal: Normal range of motion.  Neurological: He is alert and oriented to  person, place, and time.  Skin: Skin is warm and dry. No rash noted.  Psychiatric: He has a normal mood and affect.    ED Course  Procedures (including critical care time) Labs Review Labs Reviewed  Rosezena Sensor, ED    Imaging Review No results found. I have personally reviewed and evaluated these images and lab results as part of my medical decision-making.   EKG Interpretation None      MDM   Final diagnoses:  None    1. Chest pain  The patient has a documented history of cocaine use and presents for chest pain. EKG showing no acute changes.He is hypertensive in the ED as well. Troponin ordered to evaluate for ischemia.   Patient is refusing bloodwork tonight. He can be discharged and is encouraged to follow up with a primary care provider.     Elpidio Anis, PA-C 05/31/15 1478  Zadie Rhine, MD 05/31/15 [phone]

## 2015-05-31 NOTE — Discharge Instructions (Signed)
Do not hesitate to return to the emergency room for any new, worsening or concerning symptoms.  Please obtain primary care using resource guide below. Let them know that you were seen in the emergency room and that they will need to obtain records for further outpatient management.     Migraine Headache A migraine headache is an intense, throbbing pain on one or both sides of your head. A migraine can last for 30 minutes to several hours. CAUSES  The exact cause of a migraine headache is not always known. However, a migraine may be caused when nerves in the brain become irritated and release chemicals that cause inflammation. This causes pain. Certain things may also trigger migraines, such as:  Alcohol.  Smoking.  Stress.  Menstruation.  Aged cheeses.  Foods or drinks that contain nitrates, glutamate, aspartame, or tyramine.  Lack of sleep.  Chocolate.  Caffeine.  Hunger.  Physical exertion.  Fatigue.  Medicines used to treat chest pain (nitroglycerine), birth control pills, estrogen, and some blood pressure medicines. SIGNS AND SYMPTOMS  Pain on one or both sides of your head.  Pulsating or throbbing pain.  Severe pain that prevents daily activities.  Pain that is aggravated by any physical activity.  Nausea, vomiting, or both.  Dizziness.  Pain with exposure to bright lights, loud noises, or activity.  General sensitivity to bright lights, loud noises, or smells. Before you get a migraine, you may get warning signs that a migraine is coming (aura). An aura may include:  Seeing flashing lights.  Seeing bright spots, halos, or zigzag lines.  Having tunnel vision or blurred vision.  Having feelings of numbness or tingling.  Having trouble talking.  Having muscle weakness. DIAGNOSIS  A migraine headache is often diagnosed based on:  Symptoms.  Physical exam.  A CT scan or MRI of your head. These imaging tests cannot diagnose migraines, but  they can help rule out other causes of headaches. TREATMENT Medicines may be given for pain and nausea. Medicines can also be given to help prevent recurrent migraines.  HOME CARE INSTRUCTIONS  Only take over-the-counter or prescription medicines for pain or discomfort as directed by your health care provider. The use of long-term narcotics is not recommended.  Lie down in a dark, quiet room when you have a migraine.  Keep a journal to find out what may trigger your migraine headaches. For example, write down:  What you eat and drink.  How much sleep you get.  Any change to your diet or medicines.  Limit alcohol consumption.  Quit smoking if you smoke.  Get 7-9 hours of sleep, or as recommended by your health care provider.  Limit stress.  Keep lights dim if bright lights bother you and make your migraines worse. SEEK IMMEDIATE MEDICAL CARE IF:   Your migraine becomes severe.  You have a fever.  You have a stiff neck.  You have vision loss.  You have muscular weakness or loss of muscle control.  You start losing your balance or have trouble walking.  You feel faint or pass out.  You have severe symptoms that are different from your first symptoms. MAKE SURE YOU:   Understand these instructions.  Will watch your condition.  Will get help right away if you are not doing well or get worse.   This information is not intended to replace advice given to you by your health care provider. Make sure you discuss any questions you have with your health care provider.  Document Released: 01/18/2005 Document Revised: 02/08/2014 Document Reviewed: 09/25/2012 Elsevier Interactive Patient Education [employer]2016 [employer]

## 2015-06-08 ENCOUNTER — Encounter (HOSPITAL_COMMUNITY): Payer: Self-pay | Admitting: *Deleted

## 2015-06-08 ENCOUNTER — Emergency Department (HOSPITAL_COMMUNITY)
Admission: EM | Admit: 2015-06-08 | Discharge: 2015-06-08 | Disposition: A | Discharge status: Home / Self Care | Payer: Self-pay | Attending: Emergency Medicine | Admitting: Emergency Medicine

## 2015-06-08 ENCOUNTER — Encounter (HOSPITAL_COMMUNITY): Payer: Self-pay | Admitting: Emergency Medicine

## 2015-06-08 DIAGNOSIS — J988 Other specified respiratory disorders: Secondary | ICD-10-CM

## 2015-06-08 DIAGNOSIS — Z590 Homelessness: Secondary | ICD-10-CM | POA: Insufficient documentation

## 2015-06-08 DIAGNOSIS — Z8659 Personal history of other mental and behavioral disorders: Secondary | ICD-10-CM | POA: Insufficient documentation

## 2015-06-08 DIAGNOSIS — F17210 Nicotine dependence, cigarettes, uncomplicated: Secondary | ICD-10-CM | POA: Insufficient documentation

## 2015-06-08 DIAGNOSIS — J069 Acute upper respiratory infection, unspecified: Secondary | ICD-10-CM | POA: Insufficient documentation

## 2015-06-08 DIAGNOSIS — R52 Pain, unspecified: Secondary | ICD-10-CM | POA: Insufficient documentation

## 2015-06-08 DIAGNOSIS — Z79899 Other long term (current) drug therapy: Secondary | ICD-10-CM | POA: Insufficient documentation

## 2015-06-08 DIAGNOSIS — Z7951 Long term (current) use of inhaled steroids: Secondary | ICD-10-CM | POA: Insufficient documentation

## 2015-06-08 DIAGNOSIS — Z7952 Long term (current) use of systemic steroids: Secondary | ICD-10-CM | POA: Insufficient documentation

## 2015-06-08 DIAGNOSIS — Z791 Long term (current) use of non-steroidal anti-inflammatories (NSAID): Secondary | ICD-10-CM | POA: Insufficient documentation

## 2015-06-08 DIAGNOSIS — Z8679 Personal history of other diseases of the circulatory system: Secondary | ICD-10-CM | POA: Insufficient documentation

## 2015-06-08 DIAGNOSIS — J45909 Unspecified asthma, uncomplicated: Secondary | ICD-10-CM | POA: Insufficient documentation

## 2015-06-08 MED ORDER — GUAIFENESIN 100 MG/5ML PO LIQD: 100.0000 mg | ORAL | Status: DC | PRN

## 2015-06-08 MED ORDER — FLUTICASONE PROPIONATE 50 MCG/ACT NA SUSP
2.0000 | Freq: Every day | NASAL | Status: DC
  Administered 2015-06-08: 2 via NASAL
  Filled 2015-06-08: qty 16

## 2015-06-08 MED ORDER — IBUPROFEN 800 MG PO TABS
800.0000 mg | ORAL_TABLET | Freq: Once | ORAL | Status: AC
  Administered 2015-06-08: 800 mg via ORAL
  Filled 2015-06-08: qty 1

## 2015-06-08 MED ORDER — BENZONATATE 100 MG PO CAPS: 100.0000 mg | ORAL_CAPSULE | Freq: Three times a day (TID) | ORAL | Status: DC | PRN

## 2015-06-08 MED ORDER — BENZONATATE 100 MG PO CAPS
100.0000 mg | ORAL_CAPSULE | Freq: Once | ORAL | Status: AC
  Administered 2015-06-08: 100 mg via ORAL
  Filled 2015-06-08: qty 1

## 2015-06-08 NOTE — ED Notes (Signed)
Pt was seen earlier this morning and was not given an inhaler to take home. Per EMS, he was requesting a ride downtown when he said he needed to go get his medications.

## 2015-06-08 NOTE — ED Notes (Signed)
Bed: OZ30WA10 Expected date:  Expected time:  Means of arrival:  Comments: EMS Cold Symptoms x4 days

## 2015-06-08 NOTE — Discharge Instructions (Signed)
Upper Respiratory Infection, Adult Most upper respiratory infections (URIs) are a viral infection of the air passages leading to the lungs. A URI affects the nose, throat, and upper air passages. The most common type of URI is nasopharyngitis and is typically referred to as "the common cold." URIs run their course and usually go away on their own. Most of the time, a URI does not require medical attention, but sometimes a bacterial infection in the upper airways can follow a viral infection. This is called a secondary infection. Sinus and middle ear infections are common types of secondary upper respiratory infections. Bacterial pneumonia can also complicate a URI. A URI can worsen asthma and chronic obstructive pulmonary disease (COPD). Sometimes, these complications can require emergency medical care and may be life threatening.  CAUSES Almost all URIs are caused by viruses. A virus is a type of germ and can spread from one person to another.  RISKS FACTORS You may be at risk for a URI if:   You smoke.   You have chronic heart or lung disease.  You have a weakened defense (immune) system.   You are very young or very old.   You have nasal allergies or asthma.  You work in crowded or poorly ventilated areas.  You work in health care facilities or schools. SIGNS AND SYMPTOMS  Symptoms typically develop 2-3 days after you come in contact with a cold virus. Most viral URIs last 7-10 days. However, viral URIs from the influenza virus (flu virus) can last 14-18 days and are typically more severe. Symptoms may include:   Runny or stuffy (congested) nose.   Sneezing.   Cough.   Sore throat.   Headache.   Fatigue.   Fever.   Loss of appetite.   Pain in your forehead, behind your eyes, and over your cheekbones (sinus pain).  Muscle aches.  DIAGNOSIS  Your health care provider may diagnose a URI by:  Physical exam.  Tests to check that your symptoms are not due to  another condition such as:  Strep throat.  Sinusitis.  Pneumonia.  Asthma. TREATMENT  A URI goes away on its own with time. It cannot be cured with medicines, but medicines may be prescribed or recommended to relieve symptoms. Medicines may help:  Reduce your fever.  Reduce your cough.  Relieve nasal congestion. HOME CARE INSTRUCTIONS   Take medicines only as directed by your health care provider.   Gargle warm saltwater or take cough drops to comfort your throat as directed by your health care provider.  Use a warm mist humidifier or inhale steam from a shower to increase air moisture. This may make it easier to breathe.  Drink enough fluid to keep your urine clear or pale yellow.   Eat soups and other clear broths and maintain good nutrition.   Rest as needed.   Return to work when your temperature has returned to normal or as your health care provider advises. You may need to stay home longer to avoid infecting others. You can also use a face mask and careful hand washing to prevent spread of the virus.  Increase the usage of your inhaler if you have asthma.   Do not use any tobacco products, including cigarettes, chewing tobacco, or electronic cigarettes. If you need help quitting, ask your health care provider. PREVENTION  The best way to protect yourself from getting a cold is to practice good hygiene.   Avoid oral or hand contact with people with cold   symptoms.   Wash your hands often if contact occurs.  There is no clear evidence that vitamin C, vitamin E, echinacea, or exercise reduces the chance of developing a cold. However, it is always recommended to get plenty of rest, exercise, and practice good nutrition.  SEEK MEDICAL CARE IF:   You are getting worse rather than better.   Your symptoms are not controlled by medicine.   You have chills.  You have worsening shortness of breath.  You have brown or red mucus.  You have yellow or brown nasal  discharge.  You have pain in your face, especially when you bend forward.  You have a fever.  You have swollen neck glands.  You have pain while swallowing.  You have white areas in the back of your throat. SEEK IMMEDIATE MEDICAL CARE IF:   You have severe or persistent:  Headache.  Ear pain.  Sinus pain.  Chest pain.  You have chronic lung disease and any of the following:  Wheezing.  Prolonged cough.  Coughing up blood.  A change in your usual mucus.  You have a stiff neck.  You have changes in your:  Vision.  Hearing.  Thinking.  Mood. MAKE SURE YOU:   Understand these instructions.  Will watch your condition.  Will get help right away if you are not doing well or get worse.   This information is not intended to replace advice given to you by your health care provider. Make sure you discuss any questions you have with your health care provider.   Document Released: 07/14/2000 Document Revised: 06/04/2014 Document Reviewed: 04/25/2013 Elsevier Interactive Patient Education 2016 [employer] Inc.  

## 2015-06-08 NOTE — ED Notes (Signed)
Pt c/o generalized body aches, was d/c'd from here earlier this morning, now is requesting chest scan. He is not answering questions, keeps saying he needs to talk to his mommy and daddy.
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
## 2015-06-08 NOTE — ED Provider Notes (Signed)
CSN: [id number]     Arrival date & time 06/08/15  0341 History  By signing my name below, I, Phillis Haggis, attest that this documentation has been prepared under the direction and in the presence of [employer], PA-C. Electronically Signed: Phillis Haggis, ED Scribe. 06/08/2015. 3:53 AM.   Chief Complaint  Patient presents with  . Nasal Congestion   The history is provided by the patient. No language interpreter was used.  HPI Comments: Dustin Norris is a 40 y.o. Male with a hx of homelessness and multiple ED visits brought in by EMS from [facility] who presents to the Emergency Department complaining of nasal congestion onset 4 days ago. He reports associated rhinorrhea, cough, and sore throat. Pt states that he has been taking Tylenol to no relief. He reports sick contacts at the shelter he stays at. Pt denies SOB, vomiting, diarrhea, or LOC.   Past Medical History  Diagnosis Date  . Anxiety   . Asthma   . Homelessness   . Migraine    History reviewed. No pertinent past surgical history. History reviewed. No pertinent family history. Social History  Substance Use Topics  . Smoking status: Current Every Day Smoker -- 1.00 packs/day for 0 years    Types: Cigars  . Smokeless tobacco: None  . Alcohol Use: Yes     Comment: couple drinks    Review of Systems  HENT: Positive for congestion, rhinorrhea and sore throat.   Respiratory: Positive for cough. Negative for shortness of breath.   Gastrointestinal: Negative for vomiting and diarrhea.  Neurological: Negative for syncope.  All other systems reviewed and are negative.  Allergies  Review of patient's allergies indicates no known allergies.  Home Medications   Prior to Admission medications   Medication Sig Start Date End Date Taking? Authorizing Provider  albuterol (PROVENTIL HFA;VENTOLIN HFA) 108 (90 Base) MCG/ACT inhaler Inhale 2 puffs into the lungs every 6 (six) hours as needed for wheezing or shortness of  breath.   Yes Historical Provider, MD  benzonatate (TESSALON) 100 MG capsule Take 1 capsule (100 mg total) by mouth 3 (three) times daily as needed for cough. 06/08/15   [name], PA-C  butalbital-acetaminophen-caffeine (FIORICET) [phone] MG tablet Take 1-2 tablets by mouth every 6 (six) hours as needed for headache. Patient not taking: Reported on 04/10/2015 04/02/15 04/01/16  Fayrene Helper, PA-C  clotrimazole (LOTRIMIN) 1 % cream Apply to affected area 2 times daily Patient not taking: Reported on 04/10/2015 04/02/15   Joycie Peek, PA-C  guaiFENesin (ROBITUSSIN) 100 MG/5ML liquid Take 5-10 mLs (100-200 mg total) by mouth every 4 (four) hours as needed for cough. 06/08/15   [name], PA-C  guaiFENesin-dextromethorphan (ROBITUSSIN DM) 100-10 MG/5ML syrup Take 5 mLs by mouth every 4 (four) hours as needed for cough. Patient not taking: Reported on 04/10/2015 03/27/15   Tatyana Kirichenko, PA-C  ibuprofen (ADVIL,MOTRIN) 800 MG tablet Take 1 tablet (800 mg total) by mouth every 8 (eight) hours as needed for mild pain. Patient not taking: Reported on 04/10/2015 03/25/15   Layla Maw Ward, DO  metoCLOPramide (REGLAN) 10 MG tablet Take 1 tablet (10 mg total) by mouth every 6 (six) hours as needed for nausea (or headache). Patient not taking: Reported on 04/11/2015 03/26/15   Dione Booze, MD  predniSONE (DELTASONE) 20 MG tablet Take 2 tablets (40 mg total) by mouth daily. Patient not taking: Reported on 04/01/2015 03/27/15   Tatyana Kirichenko, PA-C   BP 127/83 mmHg  Pulse 73  Temp(Src) 99.4  F (37.4 C) (Oral)  Resp 18  SpO2 100%   Physical Exam  Constitutional: He is oriented to person, place, and time. He appears well-developed and well-nourished. No distress.  Nontoxic/nonseptic appearing  HENT:  Head: Normocephalic and atraumatic.  Audible nasal congestion. Nares patent bilaterally. Oropharynx clear. Uvula midline. Patient tolerating secretions without difficulty.  Eyes: Conjunctivae and EOM are normal.  No scleral icterus.  Neck: Normal range of motion.  No nuchal rigidity or meningismus  Cardiovascular: Normal rate, regular rhythm and intact distal pulses.   Pulmonary/Chest: Effort normal and breath sounds normal. No respiratory distress. He has no wheezes. He has no rales.  Musculoskeletal: Normal range of motion.  Neurological: He is alert and oriented to person, place, and time. He exhibits normal muscle tone. Coordination normal.  Patient ambulatory with steady gait.  Skin: Skin is warm and dry. No rash noted. He is not diaphoretic. No erythema. No pallor.  Psychiatric: He has a normal mood and affect. His behavior is normal.  Nursing note and vitals reviewed.   ED Course  Procedures (including critical care time) DIAGNOSTIC STUDIES: Oxygen Saturation is 98% on RA, normal by my interpretation.    COORDINATION OF CARE: 3:53 AM-Discussed treatment plan with pt at bedside and pt agreed to plan.   Labs Review Labs Reviewed - No data to display  Imaging Review No results found.   I have personally reviewed and evaluated these images and lab results as part of my medical decision-making.   EKG Interpretation None      MDM   Final diagnoses:  URI (upper respiratory infection)    Patient's symptoms are consistent with URI, likely viral etiology. No fever, tachypnea, dyspnea, or hypoxia to suggest PNA. Lungs CTAB. Discussed that antibiotics are not indicated for viral infections. Patient will be discharged with symptomatic treatment. Pt is hemodynamically stable and in NAD prior to discharge. This is his 23rd ED visit in the past 6 months.  I personally performed the services described in this documentation, which was scribed in my presence. The recorded information has been reviewed and is accurate.    Filed Vitals:   06/08/15 0345 06/08/15 0427  BP: 143/92 127/83  Pulse: 79 73  Temp: 99.4 F (37.4 C)   TempSrc: Oral   Resp: 18 18  SpO2: 98% 100%      Antony MaduraKelly  Shavonne Ambroise, PA-C 06/08/15 0431  Melene Planan Floyd, DO 06/08/15 [phone]
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
## 2015-06-08 NOTE — ED Notes (Signed)
[facility].  Per EMS, pt reported that he has not been feeling well to colds and nasal congestion since 4 days ago.  Pt denies cough or shortness of breath.

## 2015-06-22 ENCOUNTER — Encounter (HOSPITAL_COMMUNITY): Payer: Self-pay

## 2015-06-22 ENCOUNTER — Emergency Department (HOSPITAL_COMMUNITY)
Admission: EM | Admit: 2015-06-22 | Discharge: 2015-06-22 | Disposition: A | Discharge status: Home / Self Care | Payer: Self-pay | Attending: Emergency Medicine | Admitting: Emergency Medicine

## 2015-06-22 DIAGNOSIS — J020 Streptococcal pharyngitis: Secondary | ICD-10-CM | POA: Insufficient documentation

## 2015-06-22 LAB — RAPID STREP SCREEN (MED CTR MEBANE ONLY): Streptococcus, Group A Screen (Direct): POSITIVE — AB

## 2015-06-22 MED ORDER — PENICILLIN V POTASSIUM 500 MG PO TABS: 500.0000 mg | ORAL_TABLET | Freq: Two times a day (BID) | ORAL | Status: DC

## 2015-06-22 NOTE — ED Provider Notes (Signed)
CSN: [id number]     Arrival date & time 06/22/15  2009 History   By signing my name below, I, Evon Slack, attest that this documentation has been prepared under the direction and in the presence of [employer], PA-C. Electronically Signed: Evon Slack, ED Scribe. 06/22/2015. 9:48 PM.    Chief Complaint  Patient presents with  . Cough   Patient is a 40 y.o. male presenting with cough. The history is provided by the patient. No language interpreter was used.  Cough Associated symptoms: sore throat   Associated symptoms: no chest pain, no chills, no diaphoresis, no ear pain, no eye discharge, no fever, no rhinorrhea and no shortness of breath    HPI Comments: Dustin Norris is a 40 y.o. male from out of town staying at local shelter presents to the Emergency Department complaining of productive cough onset onset 1 week prior. Pt reports associated sore throat and congestion. He states that the sore throat is worse when swallowing. Pt denies any medications PTA. Denies SOB, CP, fever, chills, sweats, ear pain, neck pain/stiffness or HA. Patient endorses sick contacts.     History reviewed. No pertinent past medical history. History reviewed. No pertinent past surgical history. History reviewed. No pertinent family history. Social History  Substance Use Topics  . Smoking status: Never Smoker   . Smokeless tobacco: None  . Alcohol Use: No    Review of Systems  Constitutional: Negative for fever, chills and diaphoresis.  HENT: Positive for congestion and sore throat. Negative for ear pain, rhinorrhea, sinus pressure, trouble swallowing and voice change.   Eyes: Negative for discharge.  Respiratory: Positive for cough. Negative for shortness of breath.   Cardiovascular: Negative for chest pain and leg swelling.  Musculoskeletal: Negative for neck pain and neck stiffness.  All other systems reviewed and are negative.    Allergies  Review of patient's allergies  indicates no known allergies.  Home Medications   Prior to Admission medications   Medication Sig Start Date End Date Taking? Authorizing Provider  penicillin v potassium (VEETID) 500 MG tablet Take 1 tablet (500 mg total) by mouth 2 (two) times daily. 06/22/15   Lona Kettle, PA-C   BP 113/62 mmHg  Pulse 71  Temp(Src) 98.4 F (36.9 C) (Oral)  Resp 16  Wt 67.903 kg  SpO2 98%   Physical Exam  Constitutional: He appears well-developed and well-nourished. No distress.  HENT:  Head: Normocephalic and atraumatic.  Right Ear: Tympanic membrane, external ear and ear canal normal.  Left Ear: Tympanic membrane, external ear and ear canal normal.  Nose: Right sinus exhibits no maxillary sinus tenderness and no frontal sinus tenderness. Left sinus exhibits no maxillary sinus tenderness and no frontal sinus tenderness.  Mouth/Throat: Uvula is midline and mucous membranes are normal. No trismus in the jaw. No uvula swelling.  No trismus. Uvula midline. No uvula swelling. Difficult exam secondary to patient cooperation. Patient tongue in way of unobstructed view of posterior pharynx and tonsils. Attempted with tongue depressor as well. After several attempts patient declined further examination. No swelling in sublingual region. No elevation or swelling of tongue.   Eyes: Conjunctivae and EOM are normal. Pupils are equal, round, and reactive to light. No scleral icterus.  Neck: Trachea normal, normal range of motion and phonation normal. Neck supple. No tracheal tenderness, no spinous process tenderness and no muscular tenderness present. No rigidity. No tracheal deviation and normal range of motion present.  Cardiovascular: Normal rate, regular rhythm and normal  heart sounds.   No murmur heard. Pulmonary/Chest: Effort normal and breath sounds normal. No stridor. No respiratory distress. He has no wheezes.  Musculoskeletal: Normal range of motion.  Lymphadenopathy:    He has no cervical  adenopathy.  Neurological: He is alert. Coordination normal.  Skin: Skin is warm and dry. He is not diaphoretic.  Psychiatric: He has a normal mood and affect. His behavior is normal.  Nursing note and vitals reviewed.   ED Course  Procedures (including critical care time) DIAGNOSTIC STUDIES: Oxygen Saturation is 98% on RA, normal by my interpretation.    COORDINATION OF CARE: 9:46 PM-Discussed treatment plan with pt at bedside and pt agreed to plan.     Labs Review Labs Reviewed  RAPID STREP SCREEN (NOT AT [facility]) - Abnormal; Notable for the following:    Streptococcus, Group A Screen (Direct) POSITIVE (*)    All other components within normal limits    Imaging Review No results found. I have personally reviewed and evaluated these lab results as part of my medical decision-making.   EKG Interpretation None      MDM   Final diagnoses:  Strep pharyngitis    Rapid strep positive. Pt afebrile and non-toxic. Vital sings stable. No trismus or uvula deviation - doubt PTA or soft tissue infection. Rx PCN VK. Discussed further symptomatic management. Specific return precautions discussed. Pt able to drink water in ED without difficulty with intact air way. Patient voiced returning to [facility] - recommended PCP follow up. Patient voiced understanding and is agreeable.   I personally performed the services described in this documentation, which was scribed in my presence. The recorded information has been reviewed and is accurate.      Lona Kettleshley Laurel Meyer, PA-C 06/24/15 1442  Richardean Canalavid H Yao, MD 06/25/15 1800

## 2015-06-22 NOTE — ED Notes (Signed)
Patient states he has had a cough for the past week.  No medications taken for the cough. Denies any coughing or sweating with the cough.  Patient alert and oriented x4

## 2015-06-22 NOTE — ED Notes (Signed)
Patient also reports that he has a sore throat with the cough for the past week

## 2015-06-22 NOTE — Discharge Instructions (Signed)
Read the information below.   Use the prescribed medication as directed.  Please discuss all new medications with your pharmacist.  You are being prescribed an antibiotic please take full course of antibiotics even if feeling better.  Be sure to follow up with your PCP for re-evaluation.  You may return to the Emergency Department at any time for worsening condition or any new symptoms that concern you. Return to the ED if you develop fever, difficulty swallowing, difficulty breathing, drooling, wheezing, or chest pain.    Strep Throat Strep throat is an infection of the throat. It is caused by germs. Strep throat spreads from person to person because of coughing, sneezing, or close contact. HOME CARE Medicines  Take over-the-counter and prescription medicines only as told by your doctor.  Take your antibiotic medicine as told by your doctor. Do not stop taking the medicine even if you feel better.  Have family members who also have a sore throat or fever go to a doctor. Eating and Drinking  Do not share food, drinking cups, or personal items.  Try eating soft foods until your sore throat feels better.  Drink enough fluid to keep your pee (urine) clear or pale yellow. General Instructions  Rinse your mouth (gargle) with a salt-water mixture 3-4 times per day or as needed. To make a salt-water mixture, stir -1 tsp of salt into 1 cup of warm water.  Make sure that all people in your house wash their hands well.  Rest.  Stay home from school or work until you have been taking antibiotics for 24 hours.  Keep all follow-up visits as told by your doctor. This is important. GET HELP IF:  Your neck keeps getting bigger.  You get a rash, cough, or earache.  You cough up thick liquid that is green, yellow-brown, or bloody.  You have pain that does not get better with medicine.  Your problems get worse instead of getting better.  You have a fever. GET HELP RIGHT AWAY IF:  You  throw up (vomit).  You get a very bad headache.  You neck hurts or it feels stiff.  You have chest pain or you are short of breath.  You have drooling, very bad throat pain, or changes in your voice.  Your neck is swollen or the skin gets red and tender.  Your mouth is dry or you are peeing less than normal.  You keep feeling more tired or it is hard to wake up.  Your joints are red or they hurt.   This information is not intended to replace advice given to you by your health care provider. Make sure you discuss any questions you have with your health care provider.   Document Released: 07/07/2007 Document Revised: 10/09/2014 Document Reviewed: 05/13/2014 Elsevier Interactive Patient Education [employer]2016 Elsevier Inc.

## 2015-06-27 ENCOUNTER — Encounter (HOSPITAL_COMMUNITY): Payer: Self-pay | Admitting: *Deleted

## 2015-06-27 DIAGNOSIS — J45909 Unspecified asthma, uncomplicated: Secondary | ICD-10-CM | POA: Insufficient documentation

## 2015-06-27 DIAGNOSIS — F17210 Nicotine dependence, cigarettes, uncomplicated: Secondary | ICD-10-CM | POA: Insufficient documentation

## 2015-06-27 DIAGNOSIS — R51 Headache: Secondary | ICD-10-CM | POA: Insufficient documentation

## 2015-06-27 NOTE — ED Notes (Signed)
The pt is c/o a headache for one week  No  n v or diarrhea  advil taken does not seem to help only minimally

## 2015-06-28 ENCOUNTER — Emergency Department (HOSPITAL_COMMUNITY)
Admission: EM | Admit: 2015-06-28 | Discharge: 2015-06-28 | Disposition: A | Discharge status: Home / Self Care | Payer: Self-pay | Attending: Emergency Medicine | Admitting: Emergency Medicine

## 2015-06-28 ENCOUNTER — Emergency Department (HOSPITAL_COMMUNITY): Payer: Self-pay

## 2015-06-28 ENCOUNTER — Encounter (HOSPITAL_COMMUNITY): Payer: Self-pay

## 2015-06-28 DIAGNOSIS — R079 Chest pain, unspecified: Secondary | ICD-10-CM | POA: Insufficient documentation

## 2015-06-28 DIAGNOSIS — F17210 Nicotine dependence, cigarettes, uncomplicated: Secondary | ICD-10-CM | POA: Insufficient documentation

## 2015-06-28 DIAGNOSIS — J45909 Unspecified asthma, uncomplicated: Secondary | ICD-10-CM | POA: Insufficient documentation

## 2015-06-28 DIAGNOSIS — G43909 Migraine, unspecified, not intractable, without status migrainosus: Secondary | ICD-10-CM | POA: Insufficient documentation

## 2015-06-28 NOTE — ED Notes (Signed)
Patient complains of chest pain since am and migraine headache since am. denies radiation of chest pain. Reports pain has been persistent all day.

## 2015-06-28 NOTE — ED Notes (Addendum)
Pt was called for a room but states he did not want to want any longer to see the doctor. He walked with this nurse to the assigned room with steady gait but did not enter into the room and stated he wanted to leave because he has to be at work tomorrow and wants to come back later tomorrow. This RN attempted to encourage patient to stay to see doctor but he refused. Pt observed walking out of the ED with steady gait, NAD.

## 2015-06-28 NOTE — ED Notes (Signed)
Patient refused for this RN to draw labs at triage. States he only weants and EKG and for his headache to be addressed

## 2015-06-28 NOTE — ED Notes (Signed)
No answer for vitals @ 2041

## 2015-06-28 NOTE — ED Notes (Signed)
Pt called for D33 no answer in waiting room

## 2015-06-29 DIAGNOSIS — Z8659 Personal history of other mental and behavioral disorders: Secondary | ICD-10-CM | POA: Insufficient documentation

## 2015-06-29 DIAGNOSIS — F17210 Nicotine dependence, cigarettes, uncomplicated: Secondary | ICD-10-CM | POA: Insufficient documentation

## 2015-06-29 DIAGNOSIS — Z8679 Personal history of other diseases of the circulatory system: Secondary | ICD-10-CM | POA: Insufficient documentation

## 2015-06-29 DIAGNOSIS — Z590 Homelessness: Secondary | ICD-10-CM | POA: Insufficient documentation

## 2015-06-29 DIAGNOSIS — R51 Headache: Secondary | ICD-10-CM | POA: Insufficient documentation

## 2015-06-29 DIAGNOSIS — J45909 Unspecified asthma, uncomplicated: Secondary | ICD-10-CM | POA: Insufficient documentation

## 2015-06-29 DIAGNOSIS — Z79899 Other long term (current) drug therapy: Secondary | ICD-10-CM | POA: Insufficient documentation

## 2015-06-30 ENCOUNTER — Emergency Department (HOSPITAL_COMMUNITY)
Admission: EM | Admit: 2015-06-30 | Discharge: 2015-06-30 | Disposition: A | Discharge status: Home / Self Care | Payer: Self-pay | Attending: Emergency Medicine | Admitting: Emergency Medicine

## 2015-06-30 ENCOUNTER — Encounter (HOSPITAL_COMMUNITY): Payer: Self-pay | Admitting: *Deleted

## 2015-06-30 DIAGNOSIS — R519 Headache, unspecified: Secondary | ICD-10-CM

## 2015-06-30 DIAGNOSIS — R51 Headache: Secondary | ICD-10-CM

## 2015-06-30 MED ORDER — ACETAMINOPHEN 500 MG PO TABS
1000.0000 mg | ORAL_TABLET | Freq: Once | ORAL | Status: AC
  Administered 2015-06-30: 1000 mg via ORAL
  Filled 2015-06-30: qty 2

## 2015-06-30 NOTE — ED Notes (Signed)
Patient presents via EMS with c/o "not feeling well".  Stated he needs some tylenol or something for his headache

## 2015-06-30 NOTE — ED Provider Notes (Signed)
CSN: [id number]     Arrival date & time 06/29/15  2335 History   First MD Initiated Contact with Patient 06/30/15 0014     Chief Complaint  Patient presents with  . Headache     (Consider location/radiation/quality/duration/timing/severity/associated sxs/prior Treatment) HPI Comments: Patient presents to the emergency department with the chief complaint of "wanting to be checked out." Asian states that he was at [employer], and states that the room was very hot and stuffy, states that he needed to "get out of there." He states that he is feeling better now that he is outside of the hot stuffy room. He states that he just needed to "get some air." He states that he has a mild headache, which is unlike previous headaches which she has had. He denies any numbness, weakness, tingling. Denies any other associated symptoms. There are no modifying factors.  The history is provided by the patient. No language interpreter was used.    Past Medical History  Diagnosis Date  . Anxiety   . Asthma   . Homelessness   . Migraine    History reviewed. No pertinent past surgical history. No family history on file. Social History  Substance Use Topics  . Smoking status: Current Every Day Smoker -- 1.00 packs/day for 0 years    Types: Cigars  . Smokeless tobacco: None  . Alcohol Use: Yes     Comment: couple drinks    Review of Systems  Constitutional: Negative for fever and chills.  Respiratory: Negative for shortness of breath.   Cardiovascular: Negative for chest pain.  Gastrointestinal: Negative for nausea, vomiting, diarrhea and constipation.  Genitourinary: Negative for dysuria.  Neurological: Positive for headaches.  All other systems reviewed and are negative.     Allergies  Review of patient's allergies indicates no known allergies.  Home Medications   Prior to Admission medications   Medication Sig Start Date End Date Taking? Authorizing Provider  albuterol  (PROVENTIL HFA;VENTOLIN HFA) 108 (90 Base) MCG/ACT inhaler Inhale 2 puffs into the lungs every 6 (six) hours as needed for wheezing or shortness of breath.    Historical Provider, MD  benzonatate (TESSALON) 100 MG capsule Take 1 capsule (100 mg total) by mouth 3 (three) times daily as needed for cough. Patient not taking: Reported on 06/08/2015 06/08/15   [name], PA-C  butalbital-acetaminophen-caffeine (FIORICET) [phone] MG tablet Take 1-2 tablets by mouth every 6 (six) hours as needed for headache. Patient not taking: Reported on 04/10/2015 04/02/15 04/01/16  Fayrene Helper, PA-C  clotrimazole (LOTRIMIN) 1 % cream Apply to affected area 2 times daily Patient not taking: Reported on 04/10/2015 04/02/15   Joycie Peek, PA-C  guaiFENesin (ROBITUSSIN) 100 MG/5ML liquid Take 5-10 mLs (100-200 mg total) by mouth every 4 (four) hours as needed for cough. Patient not taking: Reported on 06/08/2015 06/08/15   [name], PA-C  guaiFENesin-dextromethorphan (ROBITUSSIN DM) 100-10 MG/5ML syrup Take 5 mLs by mouth every 4 (four) hours as needed for cough. Patient not taking: Reported on 04/10/2015 03/27/15   Tatyana Kirichenko, PA-C  ibuprofen (ADVIL,MOTRIN) 800 MG tablet Take 1 tablet (800 mg total) by mouth every 8 (eight) hours as needed for mild pain. Patient not taking: Reported on 04/10/2015 03/25/15   Layla Maw Ward, DO  metoCLOPramide (REGLAN) 10 MG tablet Take 1 tablet (10 mg total) by mouth every 6 (six) hours as needed for nausea (or headache). Patient not taking: Reported on 04/11/2015 03/26/15   Dione Booze, MD  predniSONE (DELTASONE) 20  MG tablet Take 2 tablets (40 mg total) by mouth daily. Patient not taking: Reported on 04/01/2015 03/27/15   Tatyana Kirichenko, PA-C   BP 152/92 mmHg  Pulse 73  Temp(Src) 98.6 F (37 C) (Oral)  Resp 14  Ht 5\' 9"  (1.753 m)  Wt 68.04 kg  BMI 22.14 kg/m2  SpO2 100% Physical Exam  Constitutional: He is oriented to person, place, and time. He appears well-developed and  well-nourished.  HENT:  Head: Normocephalic and atraumatic.  Right Ear: External ear normal.  Left Ear: External ear normal.  Eyes: Conjunctivae and EOM are normal. Pupils are equal, round, and reactive to light.  Neck: Normal range of motion. Neck supple.  No pain with neck flexion, no meningismus  Cardiovascular: Normal rate, regular rhythm and normal heart sounds.  Exam reveals no gallop and no friction rub.   No murmur heard. Pulmonary/Chest: Effort normal and breath sounds normal. No respiratory distress. He has no wheezes. He has no rales. He exhibits no tenderness.  Abdominal: Soft. He exhibits no distension and no mass. There is no tenderness. There is no rebound and no guarding.  Musculoskeletal: Normal range of motion. He exhibits no edema or tenderness.  Normal gait.  Neurological: He is alert and oriented to person, place, and time. He has normal reflexes.  CN 3-12 intact, normal finger to nose, no pronator drift, sensation and strength intact bilaterally, ambulates with ease  Skin: Skin is warm and dry.  Psychiatric: He has a normal mood and affect. His behavior is normal. Judgment and thought content normal.  Nursing note and vitals reviewed.   ED Course  Procedures (including critical care time)   MDM   Final diagnoses:  Nonintractable headache, unspecified chronicity pattern, unspecified headache type    Pt HA treated and improved while in ED. Patient states that he was already feeling much better after arriving.  States that most of his "discomfort" was from being in a hot stuffy room at [employer] [facility].  Presentation is like pts typical HA and non concerning [facility], ICH, Meningitis, or temporal arteritis. Pt is afebrile with no focal neuro deficits, nuchal rigidity, or change in vision. Pt is to follow up with PCP to discuss prophylactic medication. Pt verbalizes understanding and is agreeable with plan to dc.      Roxy Horsemanobert Dalina Samara, PA-C 06/30/15  0022  Gilda Creasehristopher J Pollina, MD 06/30/15 [phone]

## 2015-06-30 NOTE — ED Notes (Signed)
See EDP assessment 

## 2015-06-30 NOTE — ED Notes (Addendum)
Patient states he has pressure in his head  Told EMS he needs some more of his albuterol pills

## 2015-06-30 NOTE — ED Notes (Signed)
Bus pass provided.

## 2015-06-30 NOTE — Discharge Instructions (Signed)

## 2015-07-03 ENCOUNTER — Encounter (HOSPITAL_BASED_OUTPATIENT_CLINIC_OR_DEPARTMENT_OTHER): Payer: Self-pay | Admitting: *Deleted

## 2015-07-03 ENCOUNTER — Emergency Department (HOSPITAL_BASED_OUTPATIENT_CLINIC_OR_DEPARTMENT_OTHER)
Admission: EM | Admit: 2015-07-03 | Discharge: 2015-07-03 | Disposition: A | Discharge status: Home / Self Care | Payer: Self-pay | Attending: Emergency Medicine | Admitting: Emergency Medicine

## 2015-07-03 DIAGNOSIS — R0600 Dyspnea, unspecified: Secondary | ICD-10-CM | POA: Insufficient documentation

## 2015-07-03 DIAGNOSIS — F17210 Nicotine dependence, cigarettes, uncomplicated: Secondary | ICD-10-CM | POA: Insufficient documentation

## 2015-07-03 DIAGNOSIS — J45909 Unspecified asthma, uncomplicated: Secondary | ICD-10-CM | POA: Insufficient documentation

## 2015-07-03 MED ORDER — ALBUTEROL SULFATE HFA 108 (90 BASE) MCG/ACT IN AERS
2.0000 | INHALATION_SPRAY | RESPIRATORY_TRACT | Status: DC | PRN
  Administered 2015-07-03: 2 via RESPIRATORY_TRACT
  Filled 2015-07-03: qty 6.7

## 2015-07-03 NOTE — ED Notes (Signed)
Pt. Telling RN he has never been to this ED before.  Pt. Tells RN he has chest pain is a 7 mid sternal.  Pt. Reports he had chest pain and shortness of breath with nausea so he called EMS today to bring him to the hosp.  Pt. In no distress at time of arrival to ED walked into facility with no difficulty.  Pt. Alert and oriented.  Pt. Reports he has been out of his inhalers and needs new ones.  Pt. Reports to RN upon assessment that his chest pain is a 7/10 with noted some SHOB.  Pt. HR is 72 with NSR noted on Monitor and 100% on RA and B/P is 140/76.   [facility] Pt. Multiple times [facility] and he said:  "No I have never been here to this emergency department"

## 2015-07-03 NOTE — ED Notes (Signed)
Inhaler and spacer given for home use by RT. Resources given for finding PCP

## 2015-07-03 NOTE — ED Provider Notes (Signed)
CSN: [id number]     Arrival date & time 07/03/15  1457 History   First MD Initiated Contact with Patient 07/03/15 1558     Chief Complaint  Patient presents with  . Shortness of Breath     (Consider location/radiation/quality/duration/timing/severity/associated sxs/prior Treatment) HPI Comments: Patient is a 40 year old male with past medical history of asthma, anxiety, and migraines. He presents for evaluation of shortness of breath. He reports being out of his inhaler. He denies any fevers or chills. He denies any chest pain.  Patient is a 40 y.o. male presenting with shortness of breath. The history is provided by the patient.  Shortness of Breath Severity:  Moderate Onset quality:  Gradual Timing:  Constant Progression:  Worsening Chronicity:  New Relieved by:  Nothing Worsened by:  Nothing tried Ineffective treatments:  None tried Associated symptoms: wheezing   Associated symptoms: no fever     Past Medical History  Diagnosis Date  . Anxiety   . Asthma   . Homelessness   . Migraine    History reviewed. No pertinent past surgical history. No family history on file. Social History  Substance Use Topics  . Smoking status: Current Every Day Smoker -- 1.00 packs/day for 0 years    Types: Cigars  . Smokeless tobacco: None  . Alcohol Use: Yes     Comment: couple drinks    Review of Systems  Constitutional: Negative for fever.  Respiratory: Positive for shortness of breath and wheezing.   All other systems reviewed and are negative.     Allergies  Review of patient's allergies indicates no known allergies.  Home Medications   Prior to Admission medications   Medication Sig Start Date End Date Taking? Authorizing Provider  albuterol (PROVENTIL HFA;VENTOLIN HFA) 108 (90 Base) MCG/ACT inhaler Inhale 2 puffs into the lungs every 6 (six) hours as needed for wheezing or shortness of breath.    Historical Provider, MD  benzonatate (TESSALON) 100 MG capsule Take 1  capsule (100 mg total) by mouth 3 (three) times daily as needed for cough. Patient not taking: Reported on 06/08/2015 06/08/15   [name], PA-C  butalbital-acetaminophen-caffeine (FIORICET) [phone] MG tablet Take 1-2 tablets by mouth every 6 (six) hours as needed for headache. Patient not taking: Reported on 04/10/2015 04/02/15 04/01/16  Fayrene Helper, PA-C  clotrimazole (LOTRIMIN) 1 % cream Apply to affected area 2 times daily Patient not taking: Reported on 04/10/2015 04/02/15   Joycie Peek, PA-C  guaiFENesin (ROBITUSSIN) 100 MG/5ML liquid Take 5-10 mLs (100-200 mg total) by mouth every 4 (four) hours as needed for cough. Patient not taking: Reported on 06/08/2015 06/08/15   [name], PA-C  guaiFENesin-dextromethorphan (ROBITUSSIN DM) 100-10 MG/5ML syrup Take 5 mLs by mouth every 4 (four) hours as needed for cough. Patient not taking: Reported on 04/10/2015 03/27/15   Tatyana Kirichenko, PA-C  ibuprofen (ADVIL,MOTRIN) 800 MG tablet Take 1 tablet (800 mg total) by mouth every 8 (eight) hours as needed for mild pain. Patient not taking: Reported on 04/10/2015 03/25/15   Layla Maw Ward, DO  metoCLOPramide (REGLAN) 10 MG tablet Take 1 tablet (10 mg total) by mouth every 6 (six) hours as needed for nausea (or headache). Patient not taking: Reported on 04/11/2015 03/26/15   Dione Booze, MD  predniSONE (DELTASONE) 20 MG tablet Take 2 tablets (40 mg total) by mouth daily. Patient not taking: Reported on 04/01/2015 03/27/15   Tatyana Kirichenko, PA-C   BP 140/76 mmHg  Pulse 72  Temp(Src) 98.8 F (37.1 C) (  Oral)  Resp 18  Ht 5\' 8"  (1.727 m)  Wt 150 lb (68.04 kg)  BMI 22.81 kg/m2  SpO2 99% Physical Exam  Constitutional: He is oriented to person, place, and time. He appears well-developed and well-nourished. No distress.  HENT:  Head: Normocephalic and atraumatic.  Mouth/Throat: Oropharynx is clear and moist.  Neck: Normal range of motion. Neck supple.  Cardiovascular: Normal rate and regular rhythm.  Exam  reveals no friction rub.   No murmur heard. Pulmonary/Chest: Effort normal and breath sounds normal. No respiratory distress. He has no wheezes. He has no rales.  Abdominal: Soft. Bowel sounds are normal. He exhibits no distension. There is no tenderness.  Musculoskeletal: Normal range of motion. He exhibits no edema.  Neurological: He is alert and oriented to person, place, and time. Coordination normal.  Skin: Skin is warm and dry. He is not diaphoretic.  Nursing note and vitals reviewed.   ED Course  Procedures (including critical care time) Labs Review Labs Reviewed - No data to display  Imaging Review No results found. I have personally reviewed and evaluated these images and lab results as part of my medical decision-making.   EKG Interpretation   Date/Time:  Thursday July 03 2015 15:31:51 EDT Ventricular Rate:  73 PR Interval:  141 QRS Duration: 79 QT Interval:  382 QTC Calculation: 421 R Axis:   85 Text Interpretation:  Sinus rhythm Probable left atrial enlargement ST  elev, probable normal early repol pattern Confirmed by Mitsuye Schrodt  MD, Ansley Mangiapane  [phone]([id number]) on 07/03/2015 4:19:25 PM      MDM   Final diagnoses:  None    Shortly after arriving here, the patient decided that he wanted to be discharged. He is requesting a refill of his inhaler. He appears otherwise stable and I do not feel as though any further workup is necessary. He has had multiple workups with his recent visits and I see no reason to repeat this. He is to return as needed if symptoms worsen or change.    Geoffery Lyonsouglas Catharina Pica, MD 07/03/15 [phone]

## 2015-07-03 NOTE — ED Notes (Signed)
Sob today. States he ran out of his inhaler 2 days ago. To ED via EMS. Speaking in complete sentences. Ambulatory.

## 2015-07-03 NOTE — Discharge Instructions (Signed)
Albuterol inhaler: 2 puffs every 4 hours as needed for wheezing.   Shortness of Breath Shortness of breath means you have trouble breathing. It could also mean that you have a medical problem. You should get immediate medical care for shortness of breath. CAUSES   Not enough oxygen in the air such as with high altitudes or a smoke-filled room.  Certain lung diseases, infections, or problems.  Heart disease or conditions, such as angina or heart failure.  Low red blood cells (anemia).  Poor physical fitness, which can cause shortness of breath when you exercise.  Chest or back injuries or stiffness.  Being overweight.  Smoking.  Anxiety, which can make you feel like you are not getting enough air. DIAGNOSIS  Serious medical problems can often be found during your physical exam. Tests may also be done to determine why you are having shortness of breath. Tests may include:  Chest X-rays.  Lung function tests.  Blood tests.  An electrocardiogram (ECG).  An ambulatory electrocardiogram. An ambulatory ECG records your heartbeat patterns over a 24-hour period.  Exercise testing.  A transthoracic echocardiogram (TTE). During echocardiography, sound waves are used to evaluate how blood flows through your heart.  A transesophageal echocardiogram (TEE).  Imaging scans. Your health care provider may not be able to find a cause for your shortness of breath after your exam. In this case, it is important to have a follow-up exam with your health care provider as directed.  TREATMENT  Treatment for shortness of breath depends on the cause of your symptoms and can vary greatly. HOME CARE INSTRUCTIONS   Do not smoke. Smoking is a common cause of shortness of breath. If you smoke, ask for help to quit.  Avoid being around chemicals or things that may bother your breathing, such as paint fumes and dust.  Rest as needed. Slowly resume your usual activities.  If medicines were  prescribed, take them as directed for the full length of time directed. This includes oxygen and any inhaled medicines.  Keep all follow-up appointments as directed by your health care provider. SEEK MEDICAL CARE IF:   Your condition does not improve in the time expected.  You have a hard time doing your normal activities even with rest.  You have any new symptoms. SEEK IMMEDIATE MEDICAL CARE IF:   Your shortness of breath gets worse.  You feel light-headed, faint, or develop a cough not controlled with medicines.  You start coughing up blood.  You have pain with breathing.  You have chest pain or pain in your arms, shoulders, or abdomen.  You have a fever.  You are unable to walk up stairs or exercise the way you normally do. MAKE SURE YOU:  Understand these instructions.  Will watch your condition.  Will get help right away if you are not doing well or get worse.   This information is not intended to replace advice given to you by your health care provider. Make sure you discuss any questions you have with your health care provider.   Document Released: 10/13/2000 Document Revised: 01/23/2013 Document Reviewed: 04/05/2011 Elsevier Interactive Patient Education [employer]

## 2015-07-04 ENCOUNTER — Encounter (HOSPITAL_BASED_OUTPATIENT_CLINIC_OR_DEPARTMENT_OTHER): Payer: Self-pay | Admitting: *Deleted

## 2015-07-04 ENCOUNTER — Emergency Department (HOSPITAL_BASED_OUTPATIENT_CLINIC_OR_DEPARTMENT_OTHER)
Admission: EM | Admit: 2015-07-04 | Discharge: 2015-07-04 | Disposition: A | Discharge status: Home / Self Care | Payer: Self-pay | Attending: Emergency Medicine | Admitting: Emergency Medicine

## 2015-07-04 DIAGNOSIS — R110 Nausea: Secondary | ICD-10-CM

## 2015-07-04 DIAGNOSIS — J45909 Unspecified asthma, uncomplicated: Secondary | ICD-10-CM | POA: Insufficient documentation

## 2015-07-04 DIAGNOSIS — I38 Endocarditis, valve unspecified: Secondary | ICD-10-CM | POA: Insufficient documentation

## 2015-07-04 DIAGNOSIS — F17210 Nicotine dependence, cigarettes, uncomplicated: Secondary | ICD-10-CM | POA: Insufficient documentation

## 2015-07-04 DIAGNOSIS — R112 Nausea with vomiting, unspecified: Secondary | ICD-10-CM | POA: Insufficient documentation

## 2015-07-04 MED ORDER — ONDANSETRON HCL 4 MG PO TABS: 4.0000 mg | ORAL_TABLET | Freq: Three times a day (TID) | ORAL | Status: DC | PRN

## 2015-07-04 MED ORDER — ONDANSETRON 4 MG PO TBDP
4.0000 mg | ORAL_TABLET | Freq: Once | ORAL | Status: AC
  Administered 2015-07-04: 4 mg via ORAL
  Filled 2015-07-04: qty 1

## 2015-07-04 NOTE — ED Notes (Signed)
To ED via EMS c.o sob. He was here yesterday via EMS with sob. He is in no distress. Speaking in complete sentences. Ambulatory to registration to wait for an exam room.

## 2015-07-04 NOTE — ED Notes (Signed)
MD at bedside. 

## 2015-07-04 NOTE — ED Provider Notes (Signed)
CSN: [id number]     Arrival date & time 07/04/15  1759 History  By signing my name below, I, Bridgette HabermannMaria Tan, attest that this documentation has been prepared under the direction and in the presence of Marily MemosJason Musette Kisamore, MD. Electronically Signed: Bridgette HabermannMaria Tan, ED Scribe. 07/04/2015. 6:41 PM.   Chief Complaint  Patient presents with  . Shortness of Breath   The history is provided by the patient. No language interpreter was used.   HPI Comments: Dustin MarvelLeonard Norris is a 40 y.o. male with a h/o asthma who presents to the Emergency Department complaining of shortness of breath that began earlier today. He was working today for about three hours until he felt sick .Patient states he has associated vomiting, nausea and diaphoresis. Pt was in ED one day ago for shortness of breath and was given an inhaler but states he left it at his cousin's house and was unable to use it today. Patient works outside in the heat and has been doing this for about three months. He believes he is overdoing it at work. Patient denies fever.   Past Medical History  Diagnosis Date  . Anxiety   . Asthma   . Homelessness   . Migraine    History reviewed. No pertinent past surgical history. No family history on file. Social History  Substance Use Topics  . Smoking status: Current Every Day Smoker -- 1.00 packs/day for 0 years    Types: Cigars  . Smokeless tobacco: None  . Alcohol Use: Yes     Comment: couple drinks    Review of Systems  Constitutional: Positive for diaphoresis. Negative for fever.  Respiratory: Positive for shortness of breath.   Gastrointestinal: Positive for nausea and vomiting.  All other systems reviewed and are negative.  Allergies  Review of patient's allergies indicates no known allergies.  Home Medications   Prior to Admission medications   Medication Sig Start Date End Date Taking? Authorizing Provider  albuterol (PROVENTIL HFA;VENTOLIN HFA) 108 (90 Base) MCG/ACT inhaler Inhale 2 puffs into the  lungs every 6 (six) hours as needed for wheezing or shortness of breath.    Historical Provider, MD  benzonatate (TESSALON) 100 MG capsule Take 1 capsule (100 mg total) by mouth 3 (three) times daily as needed for cough. Patient not taking: Reported on 06/08/2015 06/08/15   Antony MaduraKelly Humes, PA-C  butalbital-acetaminophen-caffeine (FIORICET) [id number] MG tablet Take 1-2 tablets by mouth every 6 (six) hours as needed for headache. Patient not taking: Reported on 04/10/2015 04/02/15 04/01/16  [name], PA-C  clotrimazole (LOTRIMIN) 1 % cream Apply to affected area 2 times daily Patient not taking: Reported on 04/10/2015 04/02/15   Joycie PeekBenjamin Cartner, PA-C  guaiFENesin (ROBITUSSIN) 100 MG/5ML liquid Take 5-10 mLs (100-200 mg total) by mouth every 4 (four) hours as needed for cough. Patient not taking: Reported on 06/08/2015 06/08/15   Antony MaduraKelly Humes, PA-C  guaiFENesin-dextromethorphan (ROBITUSSIN DM) 100-10 MG/5ML syrup Take 5 mLs by mouth every 4 (four) hours as needed for cough. Patient not taking: Reported on 04/10/2015 03/27/15   Tatyana Kirichenko, PA-C  ibuprofen (ADVIL,MOTRIN) 800 MG tablet Take 1 tablet (800 mg total) by mouth every 8 (eight) hours as needed for mild pain. Patient not taking: Reported on 04/10/2015 03/25/15   Layla MawKristen N Ward, DO  metoCLOPramide (REGLAN) 10 MG tablet Take 1 tablet (10 mg total) by mouth every 6 (six) hours as needed for nausea (or headache). Patient not taking: Reported on 04/11/2015 03/26/15   Dione Boozeavid Glick, MD  predniSONE Lorenza Chick(DELTASONE)  20 MG tablet Take 2 tablets (40 mg total) by mouth daily. Patient not taking: Reported on 04/01/2015 03/27/15   Tatyana Kirichenko, PA-C   BP 131/92 mmHg  Pulse 88  Temp(Src) 99.6 F (37.6 C) (Oral)  Resp 18  Ht  (1.753 m)  Wt 150 lb (68.04 kg)  BMI 22.14 kg/m2  SpO2 100%   Physical Exam  Constitutional: He is oriented to person, place, and time. He appears well-developed and well-nourished.  HENT:  Head: Normocephalic and atraumatic.  Eyes:  Conjunctivae are normal. Pupils are equal, round, and reactive to light.  Cardiovascular: Normal rate and regular rhythm.   Murmur heard. 1/6 systolic murmur  Pulmonary/Chest: Effort normal and breath sounds normal. No respiratory distress. He has no wheezes. He has no rales.  Abdominal: Soft. Bowel sounds are normal. He exhibits no distension. There is no tenderness. There is no rebound.  Musculoskeletal: Normal range of motion. He exhibits no edema or tenderness.  No back tenderness . No bilateral LE swelling or calf tenderness. No pain with dorsiflexion and plantarflexion of both feet.   Neurological: He is alert and oriented to person, place, and time.  Skin: Skin is warm and dry.  Psychiatric: He has a normal mood and affect. His behavior is normal.  Nursing note and vitals reviewed.   ED Course  Procedures (including critical care time)  DIAGNOSTIC STUDIES: Oxygen Saturation is 100% on RA, normal by my interpretation.    COORDINATION OF CARE: 6:36 PM Discussed treatment plan with pt at bedside which includes Rx of Zofran and pt agreed to plan.   MDM   Final diagnoses:  Nausea    40 yo here with nausea. No vomiting here. No nausea here. Given zofran. Abdomen benign, doubt emergent causes for symptoms at this time.   New Prescriptions: Discharge Medication List as of 07/05/2015 12:34 AM    START taking these medications   Details  ondansetron (ZOFRAN) 4 MG tablet Take 1 tablet (4 mg total) by mouth every 8 (eight) hours as needed for nausea or vomiting., Starting 07/04/2015, Until Discontinued, Print         I have personally and contemperaneously reviewed labs and imaging and used in my decision making as above.   A medical screening exam was performed and I feel the patient has had an appropriate workup for their chief complaint at this time and likelihood of emergent condition existing is low and thus workup can continue on an outpatient basis.. Their vital signs are  stable. They have been counseled on decision, discharge, follow up and which symptoms necessitate immediate return to the emergency department.  They verbally stated understanding and agreement with plan and discharged in stable condition.   I personally performed the services described in this documentation, which was scribed in my presence. The recorded information has been reviewed and is accurate.     Marily Memos, MD 07/07/15 2308

## 2015-07-10 ENCOUNTER — Encounter (HOSPITAL_BASED_OUTPATIENT_CLINIC_OR_DEPARTMENT_OTHER): Payer: Self-pay | Admitting: *Deleted

## 2015-08-02 ENCOUNTER — Encounter (HOSPITAL_COMMUNITY): Payer: Self-pay | Admitting: Emergency Medicine

## 2015-08-02 ENCOUNTER — Emergency Department (HOSPITAL_COMMUNITY)
Admission: EM | Admit: 2015-08-02 | Discharge: 2015-08-03 | Disposition: A | Discharge status: Home / Self Care | Payer: Self-pay | Attending: Emergency Medicine | Admitting: Emergency Medicine

## 2015-08-02 DIAGNOSIS — J45909 Unspecified asthma, uncomplicated: Secondary | ICD-10-CM | POA: Insufficient documentation

## 2015-08-02 DIAGNOSIS — R0789 Other chest pain: Secondary | ICD-10-CM | POA: Insufficient documentation

## 2015-08-02 DIAGNOSIS — F419 Anxiety disorder, unspecified: Secondary | ICD-10-CM | POA: Insufficient documentation

## 2015-08-02 DIAGNOSIS — F17210 Nicotine dependence, cigarettes, uncomplicated: Secondary | ICD-10-CM | POA: Insufficient documentation

## 2015-08-02 MED ORDER — ALBUTEROL SULFATE HFA 108 (90 BASE) MCG/ACT IN AERS: 2.0000 | INHALATION_SPRAY | RESPIRATORY_TRACT | Status: DC | PRN

## 2015-08-02 MED ORDER — SODIUM CHLORIDE 0.9 % IV BOLUS (SEPSIS)
1000.0000 mL | Freq: Once | INTRAVENOUS | Status: AC
  Administered 2015-08-02: 1000 mL via INTRAVENOUS

## 2015-08-02 MED ORDER — IPRATROPIUM-ALBUTEROL 0.5-2.5 (3) MG/3ML IN SOLN
3.0000 mL | Freq: Once | RESPIRATORY_TRACT | Status: AC
  Administered 2015-08-02: 3 mL via RESPIRATORY_TRACT
  Filled 2015-08-02: qty 3

## 2015-08-02 NOTE — ED Provider Notes (Signed)
CSN: [id number]     Arrival date & time 08/02/15  2054 History   First MD Initiated Contact with Patient 08/02/15 2055     Chief Complaint  Patient presents with  . Chest Pain  . Shortness of Breath     (Consider location/radiation/quality/duration/timing/severity/associated sxs/prior Treatment) HPI Comments: Patient presents to the emergency department with chief complaint of chest pain and shortness breath. Patient states that he has a history of asthma, and states that his chest felt tight today. He believes that this secondary to work that he has been doing outside. States he became overheated today and became agitated/anxious. States that this caused him to feel more short of breath. States that he has had multiple similar episodes in the past. States that he has been out of his inhaler, and requests refill. Patient states that he gets "worked up" with stress and anxiety and this causes his symptoms.  The history is provided by the patient. No language interpreter was used.    Past Medical History  Diagnosis Date  . Anxiety   . Asthma   . Homelessness   . Migraine    History reviewed. No pertinent past surgical history. No family history on file. Social History  Substance Use Topics  . Smoking status: Current Every Day Smoker -- 1.00 packs/day for 0 years    Types: Cigars  . Smokeless tobacco: None  . Alcohol Use: Yes     Comment: couple drinks    Review of Systems  Respiratory: Positive for chest tightness and shortness of breath.   All other systems reviewed and are negative.     Allergies  Review of patient's allergies indicates no known allergies.  Home Medications   Prior to Admission medications   Medication Sig Start Date End Date Taking? Authorizing Provider  albuterol (PROVENTIL HFA;VENTOLIN HFA) 108 (90 Base) MCG/ACT inhaler Inhale 2 puffs into the lungs every 6 (six) hours as needed for wheezing or shortness of breath.    Historical Provider, MD   ondansetron (ZOFRAN) 4 MG tablet Take 1 tablet (4 mg total) by mouth every 8 (eight) hours as needed for nausea or vomiting. 07/04/15   Marily MemosJason Mesner, MD  penicillin v potassium (VEETID) 500 MG tablet Take 1 tablet (500 mg total) by mouth 2 (two) times daily. 06/22/15   Lona KettleAshley Laurel Meyer, PA-C   BP 139/82 mmHg  Pulse 102  Temp(Src) 99.3 F (37.4 C)  Resp 21  SpO2 97% Physical Exam  Constitutional: He is oriented to person, place, and time. He appears well-developed and well-nourished.  HENT:  Head: Normocephalic and atraumatic.  Eyes: Conjunctivae and EOM are normal. Pupils are equal, round, and reactive to light. Right eye exhibits no discharge. Left eye exhibits no discharge. No scleral icterus.  Neck: Normal range of motion. Neck supple. No JVD present.  Cardiovascular: Normal rate, regular rhythm and normal heart sounds.  Exam reveals no gallop and no friction rub.   No murmur heard. Pulmonary/Chest: Effort normal and breath sounds normal. No respiratory distress. He has no wheezes. He has no rales. He exhibits no tenderness.  CTAB  Abdominal: Soft. He exhibits no distension and no mass. There is no tenderness. There is no rebound and no guarding.  Musculoskeletal: Normal range of motion. He exhibits no edema or tenderness.  Neurological: He is alert and oriented to person, place, and time.  Skin: Skin is warm and dry.  Psychiatric: He has a normal mood and affect. His behavior is normal. Judgment and thought  content normal.  Nursing note and vitals reviewed.   ED Course  Procedures (including critical care time)    EKG Interpretation   Date/Time:  Saturday August 02 2015 20:55:33 EDT Ventricular Rate:  94 PR Interval:    QRS Duration: 77 QT Interval:  347 QTC Calculation: 434 R Axis:   73 Text Interpretation:  Sinus rhythm Confirmed by HAVILAND MD, JULIE ([id number])  on 08/02/2015 9:00:16 PM      MDM   Final diagnoses:  Chest tightness  Anxiety  Asthma, unspecified  asthma severity, uncomplicated    Patient with reported chest pain or shortness breath. Patient seems to be more anxious than anything. He states that he feels like he got worked up today while working out in the hot weather. He states that he just needed some more cool to come and rest. Also states that he feels like he has some chest tightness similar to what he has had in the past when he has been out of his inhaler. He has been seen 31 times in the past 6 months, frequently with similar complaints. I doubt cardiac etiology of patient's symptoms. Will give breathing treatment and fluids, and will reassess. EKG is reassuring, no ischemic changes.  11:58 PM Patient feels much better.  Will give inhaler for home.  DC to home.    Roxy Horsemanobert Arieal Cuoco, PA-C 08/02/15 2359  Jacalyn LefevreJulie Haviland, MD 08/03/15 [phone]

## 2015-08-02 NOTE — Discharge Instructions (Signed)
Generalized Anxiety Disorder Generalized anxiety disorder (GAD) is a mental disorder. It interferes with life functions, including relationships, work, and school. GAD is different from normal anxiety, which everyone experiences at some point in their lives in response to specific life events and activities. Normal anxiety actually helps us prepare for and get through these life events and activities. Normal anxiety goes away after the event or activity is over.  GAD causes anxiety that is not necessarily related to specific events or activities. It also causes excess anxiety in proportion to specific events or activities. The anxiety associated with GAD is also difficult to control. GAD can vary from mild to severe. People with severe GAD can have intense waves of anxiety with physical symptoms (panic attacks).  SYMPTOMS The anxiety and worry associated with GAD are difficult to control. This anxiety and worry are related to many life events and activities and also occur more days than not for 6 months or longer. People with GAD also have three or more of the following symptoms (one or more in children):  Restlessness.   Fatigue.  Difficulty concentrating.   Irritability.  Muscle tension.  Difficulty sleeping or unsatisfying sleep. DIAGNOSIS GAD is diagnosed through an assessment by your health care provider. Your health care provider will ask you questions aboutyour mood,physical symptoms, and events in your life. Your health care provider may ask you about your medical history and use of alcohol or drugs, including prescription medicines. Your health care provider may also do a physical exam and blood tests. Certain medical conditions and the use of certain substances can cause symptoms similar to those associated with GAD. Your health care provider may refer you to a mental health specialist for further evaluation. TREATMENT The following therapies are usually used to treat GAD:    Medication. Antidepressant medication usually is prescribed for long-term daily control. Antianxiety medicines may be added in severe cases, especially when panic attacks occur.   Talk therapy (psychotherapy). Certain types of talk therapy can be helpful in treating GAD by providing support, education, and guidance. A form of talk therapy called cognitive behavioral therapy can teach you healthy ways to think about and react to daily life events and activities.  Stress managementtechniques. These include yoga, meditation, and exercise and can be very helpful when they are practiced regularly. A mental health specialist can help determine which treatment is best for you. Some people see improvement with one therapy. However, other people require a combination of therapies.   This information is not intended to replace advice given to you by your health care provider. Make sure you discuss any questions you have with your health care provider.   Document Released: 05/15/2012 Document Revised: 02/08/2014 Document Reviewed: 05/15/2012 Elsevier Interactive Patient Education 2016 [employer]  Asthma, Acute Bronchospasm Acute bronchospasm caused by asthma is also referred to as an asthma attack. Bronchospasm means your air passages become narrowed. The narrowing is caused by inflammation and tightening of the muscles in the air tubes (bronchi) in your lungs. This can make it hard to breathe or cause you to wheeze and cough. CAUSES Possible triggers are:  Animal dander from the skin, hair, or feathers of animals.  Dust mites contained in house dust.  Cockroaches.  Pollen from trees or grass.  Mold.  Cigarette or tobacco smoke.  Air pollutants such as dust, household cleaners, hair sprays, aerosol sprays, paint fumes, strong chemicals, or strong odors.  Cold air or weather changes. Cold air may trigger inflammation. Winds  increase molds and pollens in the air.  Strong emotions such  as crying or laughing hard.  Stress.  Certain medicines such as aspirin or beta-blockers.  Sulfites in foods and drinks, such as dried fruits and wine.  Infections or inflammatory conditions, such as a flu, cold, or inflammation of the nasal membranes (rhinitis).  Gastroesophageal reflux disease (GERD). GERD is a condition where stomach acid backs up into your esophagus.  Exercise or strenuous activity. SIGNS AND SYMPTOMS   Wheezing.  Excessive coughing, particularly at night.  Chest tightness.  Shortness of breath. DIAGNOSIS  Your health care provider will ask you about your medical history and perform a physical exam. A chest X-ray or blood testing may be performed to look for other causes of your symptoms or other conditions that may have triggered your asthma attack. TREATMENT  Treatment is aimed at reducing inflammation and opening up the airways in your lungs. Most asthma attacks are treated with inhaled medicines. These include quick relief or rescue medicines (such as bronchodilators) and controller medicines (such as inhaled corticosteroids). These medicines are sometimes given through an inhaler or a nebulizer. Systemic steroid medicine taken by mouth or given through an IV tube also can be used to reduce the inflammation when an attack is moderate or severe. Antibiotic medicines are only used if a bacterial infection is present.  HOME CARE INSTRUCTIONS   Rest.  Drink plenty of liquids. This helps the mucus to remain thin and be easily coughed up. Only use caffeine in moderation and do not use alcohol until you have recovered from your illness.  Do not smoke. Avoid being exposed to secondhand smoke.  You play a critical role in keeping yourself in good health. Avoid exposure to things that cause you to wheeze or to have breathing problems.  Keep your medicines up-to-date and available. Carefully follow your health care provider's treatment plan.  Take your medicine  exactly as prescribed.  When pollen or pollution is bad, keep windows closed and use an air conditioner or go to places with air conditioning.  Asthma requires careful medical care. See your health care provider for a follow-up as advised. If you are more than [redacted] weeks pregnant and you were prescribed any new medicines, let your obstetrician know about the visit and how you are doing. Follow up with your health care provider as directed.  After you have recovered from your asthma attack, make an appointment with your outpatient doctor to talk about ways to reduce the likelihood of future attacks. If you do not have a doctor who manages your asthma, make an appointment with a primary care doctor to discuss your asthma. SEEK IMMEDIATE MEDICAL CARE IF:   You are getting worse.  You have trouble breathing. If severe, call your local emergency services (911 in the [place]  You develop chest pain or discomfort.  You are vomiting.  You are not able to keep fluids down.  You are coughing up yellow, green, brown, or bloody sputum.  You have a fever and your symptoms suddenly get worse.  You have trouble swallowing. MAKE SURE YOU:   Understand these instructions.  Will watch your condition.  Will get help right away if you are not doing well or get worse.   This information is not intended to replace advice given to you by your health care provider. Make sure you discuss any questions you have with your health care provider.   Document Released: 05/05/2006 Document Revised: 01/23/2013 Document Reviewed: 07/26/2012  Chartered certified accountant Patient Education [employer].

## 2015-08-02 NOTE — ED Notes (Signed)
Pt pulled IV by self, PA notified. Will provide oral fluids.

## 2015-08-02 NOTE — ED Notes (Signed)
Pt arrives via EMS with c/o intermittent shortness of breath and CP starting this afternoon while walking outside, thinks its related to stress. EMS gave 324 MG ASA and 1SL nitro which helped with pain. Pt alert and oriented x4, speaking in complete sentences, LS clear. Pain from 8/10 to a 6/10. Hx asthma- needs refill on inhaler.

## 2015-08-03 ENCOUNTER — Emergency Department (HOSPITAL_COMMUNITY)
Admission: EM | Admit: 2015-08-03 | Discharge: 2015-08-03 | Disposition: A | Discharge status: Home / Self Care | Payer: Self-pay | Attending: Emergency Medicine | Admitting: Emergency Medicine

## 2015-08-03 ENCOUNTER — Emergency Department (HOSPITAL_COMMUNITY)
Admission: EM | Admit: 2015-08-03 | Discharge: 2015-08-04 | Disposition: A | Discharge status: Home / Self Care | Payer: Self-pay | Attending: Emergency Medicine | Admitting: Emergency Medicine

## 2015-08-03 ENCOUNTER — Encounter (HOSPITAL_COMMUNITY): Payer: Self-pay | Admitting: Emergency Medicine

## 2015-08-03 ENCOUNTER — Encounter (HOSPITAL_COMMUNITY): Payer: Self-pay

## 2015-08-03 DIAGNOSIS — R000 Tachycardia, unspecified: Secondary | ICD-10-CM | POA: Insufficient documentation

## 2015-08-03 DIAGNOSIS — T675XXA Heat exhaustion, unspecified, initial encounter: Secondary | ICD-10-CM | POA: Insufficient documentation

## 2015-08-03 DIAGNOSIS — R519 Headache, unspecified: Secondary | ICD-10-CM

## 2015-08-03 DIAGNOSIS — F17210 Nicotine dependence, cigarettes, uncomplicated: Secondary | ICD-10-CM | POA: Insufficient documentation

## 2015-08-03 DIAGNOSIS — R110 Nausea: Secondary | ICD-10-CM | POA: Insufficient documentation

## 2015-08-03 DIAGNOSIS — J45909 Unspecified asthma, uncomplicated: Secondary | ICD-10-CM | POA: Insufficient documentation

## 2015-08-03 DIAGNOSIS — Z791 Long term (current) use of non-steroidal anti-inflammatories (NSAID): Secondary | ICD-10-CM | POA: Insufficient documentation

## 2015-08-03 DIAGNOSIS — R51 Headache: Secondary | ICD-10-CM | POA: Insufficient documentation

## 2015-08-03 MED ORDER — SODIUM CHLORIDE 0.9 % IV BOLUS (SEPSIS): 1000.0000 mL | Freq: Once | INTRAVENOUS | Status: DC

## 2015-08-03 MED ORDER — ACETAMINOPHEN 325 MG PO TABS
650.0000 mg | ORAL_TABLET | Freq: Once | ORAL | Status: AC
  Administered 2015-08-03: 650 mg via ORAL
  Filled 2015-08-03: qty 2

## 2015-08-03 MED ORDER — IBUPROFEN 800 MG PO TABS: 800.0000 mg | ORAL_TABLET | Freq: Three times a day (TID) | ORAL | Status: DC

## 2015-08-03 MED ORDER — IBUPROFEN 800 MG PO TABS
800.0000 mg | ORAL_TABLET | Freq: Once | ORAL | Status: AC
  Administered 2015-08-03: 800 mg via ORAL
  Filled 2015-08-03: qty 1

## 2015-08-03 NOTE — Discharge Instructions (Signed)
Heat Exhaustion Information WHAT IS HEAT EXHAUSTION? Heat exhaustion happens when your body gets overheated from hot weather or from exercise. Heat exhaustion makes the temperature of your skin and body go up. Your body cools itself by sweating. If you do not drink enough water to replace what you sweat, you lose too much water and salt. This makes it harder for your body to produce more sweat. When you do not sweat enough, your body cannot cool down, and heat exhaustion may result. Heat exhaustion can lead to heatstroke, which is a more serious illness. WHO IS AT RISK FOR HEAT EXHAUSTION? Anyone can get heat exhaustion. However, heat exhaustion is more likely when you are exercising or doing a physical activity. It is also more likely when you are in hot and humid weather or bright sunshine. Heat exhaustion is also more likely to develop in:  People who are age 65 or older.  Children.  People who have a medical condition such as heart disease, poor circulation, sickle cell disease, or high blood pressure.  People who have a fever.  People who are very overweight (obese).  People who are dehydrated from:  Drinking alcohol or caffeine.  Taking certain medicines, such as diuretics or stimulants. WHAT ARE THE SYMPTOMS OF HEAT EXHAUSTION? Symptoms of heat exhaustion include:  A body temperature of up to 104F (40C).  Moist, cool, and clammy skin.  Dizziness.  Headache.  Nausea.  Fatigue.  Thirst.  Dark-colored urine.  Rapid pulse or heartbeat.  Weakness.  Muscle cramps.  Confusion.  Fainting. WHAT SHOULD I DO IF I THINK I HAVE HEAT EXHAUSTION? If you think that you have heat exhaustion, call your health care provider. Follow his or her instructions. You should also:  Call a friend or a family member and ask someone to stay with you.  Move to a cooler location, such as:  Into the shade.  In front of a fan.  Someplace that has air conditioning.  Lie down  and rest.  Slowly drink nonalcoholic, caffeine-free fluids.  Take off any extra clothing or tight-fitting clothes.  Take a cool bath or shower, if possible. If you do not have access to a bath or shower, dab or mist cool water on your skin. WHY IS IT IMPORTANT TO TREAT HEAT EXHAUSTION? It is extremely important to take care of yourself and treat heat exhaustion as soon as possible. Untreated heat exhaustion can turn into heatstroke. Symptoms of heatstroke include:  A body temperature of 104F (40C) or higher.  Hot, red skin that may be dry or moist.  Severe headache.  Nausea and vomiting.  Muscle weakness and cramping.  Confusion.  Rapid breathing.  Fainting.  Seizure. These symptoms may represent a serious problem that is an emergency. Do not wait to see if the symptoms will go away. Get medical help right away. Call your local emergency services (911 in the [place] Do not drive yourself to the hospital. Heatstroke is a life-threatening condition that requires urgent medical treatment. Do not treat heatstroke at home. Heatstroke should be treated by a health care professional and may require hospitalization. At the hospital, you may need to receive fluids through an IV tube:  If you cannot drink any fluids.  If you vomit any fluids that you drink.  If your symptoms do not get better after one hour.  If your symptoms get worse after one hour. HOW CAN I PREVENT HEAT EXHAUSTION?  Avoid outdoor activities on very hot or humid days.    Do not exercise or do other physical activity when you are not feeling well.  Drink plenty of nonalcoholic and caffeine-free fluids before and during physical activity.  Take frequent breaks for rest during physical activity.  Wear light-colored, loose-fitting, and lightweight clothing in the heat.  Wear a hat and use sunscreen when exercising outdoors.  Avoid being outside during the hottest times of the day.  Check with your health  care provider before you start any new activity, especially if you take medicine or have a medical condition.  Start any new activity slowly and work up to your fitness level. HOW CAN I HELP TO PROTECT ELDERLY RELATIVES AND NEIGHBORS FROM HEAT EXHAUSTION? People who are age 65 or older are at greater risk for heat exhaustion. Their bodies have a harder time adjusting to heat. They are also more likely to have a medical condition or be on medicines that increase their risk for heat exhaustion. They may get heat exhaustion indoors if the heat is high for several days. You can help to protect them during hot weather by:  Checking on them two or more times each day.  Making sure that they are drinking plenty of cool, nonalcoholic, and caffeine-free fluids.  Making sure that they use their air conditioner.  Taking them to a location where air conditioning is available.  Talking with their health care provider about their medical needs, medicines, and fluid requirements.   This information is not intended to replace advice given to you by your health care provider. Make sure you discuss any questions you have with your health care provider.   Document Released: 10/28/2007 Document Revised: 10/09/2014 Document Reviewed: 12/26/2013 Elsevier Interactive Patient Education 2016 [employer]  

## 2015-08-03 NOTE — ED Notes (Signed)
Pt states he has a headache  Pt was seen here earlier for same and was given tylenol without relief  Pt states he is tired and has no way home

## 2015-08-03 NOTE — ED Notes (Signed)
Patient refusing IV fluids and i stat. EDP aware. Patient states that he just wants to rest and drink some water because he has been working all day.

## 2015-08-03 NOTE — ED Provider Notes (Addendum)
CSN: [id number]     Arrival date & time 08/03/15  1759 History   First MD Initiated Contact with Patient 08/03/15 1802     Chief Complaint  Patient presents with  . Shortness of Breath    HPI Patient presents to the emergency room because he feels fatigued.  Patient was working outside all day today.  His job was to hold out without causing sign.  Patient states he started to feel fatigued. He feels like he just needs to get out of the heat and get some rest. He did drink water today. He denies any trouble with any chest pain or shortness of breath. Patient does have a history of similar episodes.  He has been seen in the emergency department over 30 times in the past 6 months. He was seen in the emergency room last evening for similar symptoms although he was complaining some chest discomfort. Patient denies any drug use, specifically denies cocaine. Past Medical History  Diagnosis Date  . Anxiety   . Asthma   . Homelessness   . Migraine    History reviewed. No pertinent past surgical history. History reviewed. No pertinent family history. Social History  Substance Use Topics  . Smoking status: Current Every Day Smoker -- 0.50 packs/day for 0 years    Types: Cigarettes  . Smokeless tobacco: Never Used  . Alcohol Use: No    Review of Systems  All other systems reviewed and are negative.     Allergies  Review of patient's allergies indicates no known allergies.  Home Medications   Prior to Admission medications   Medication Sig Start Date End Date Taking? Authorizing Provider  albuterol (PROVENTIL HFA;VENTOLIN HFA) 108 (90 Base) MCG/ACT inhaler Inhale 2 puffs into the lungs every 4 (four) hours as needed for wheezing or shortness of breath. Patient not taking: Reported on 08/03/2015 08/02/15   Roxy Horsemanobert Browning, PA-C  ondansetron (ZOFRAN) 4 MG tablet Take 1 tablet (4 mg total) by mouth every 8 (eight) hours as needed for nausea or vomiting. Patient not taking: Reported on 08/03/2015  07/04/15   Marily MemosJason Mesner, MD  penicillin v potassium (VEETID) 500 MG tablet Take 1 tablet (500 mg total) by mouth 2 (two) times daily. Patient not taking: Reported on 08/03/2015 06/22/15   Lona KettleAshley Laurel Meyer, PA-C   BP 149/98 mmHg  Pulse 127  Temp(Src) 98.5 F (36.9 C) (Oral)  Resp 18  SpO2 100% Physical Exam  Constitutional: He appears well-developed and well-nourished. No distress.  HENT:  Head: Normocephalic and atraumatic.  Right Ear: External ear normal.  Left Ear: External ear normal.  Eyes: Conjunctivae are normal. Right eye exhibits no discharge. Left eye exhibits no discharge. No scleral icterus.  Neck: Neck supple. No tracheal deviation present.  Cardiovascular: Regular rhythm and intact distal pulses.  Tachycardia present.   Pulmonary/Chest: Effort normal and breath sounds normal. No stridor. No respiratory distress. He has no wheezes. He has no rales.  Abdominal: Soft. Bowel sounds are normal. He exhibits no distension. There is no tenderness. There is no rebound and no guarding.  Musculoskeletal: He exhibits no edema or tenderness.  Neurological: He is alert. He has normal strength. No cranial nerve deficit (no facial droop, extraocular movements intact, no slurred speech) or sensory deficit. He exhibits normal muscle tone. He displays no seizure activity. Coordination normal.  Skin: Skin is warm and dry. No rash noted.  Psychiatric: He has a normal mood and affect.  Nursing note and vitals reviewed.   ED  Course  Procedures    EKG Interpretation  Date/Time:  Sunday August 03 2015 18:23:13 EDT Ventricular Rate:  107 PR Interval:    QRS Duration: 80 QT Interval:  339 QTC Calculation: 453 R Axis:   82 Text Interpretation:  Sinus tachycardia RSR' in V1 or V2, probably normal variant Left ventricular hypertrophy Baseline wander in lead(s) V3 No significant change since last tracing Confirmed by Kaikoa Magro  MD-J, Rahmir Beever ([id number]) on 08/03/2015 6:31:22 PM       MDM   Final  diagnoses:  Tachycardia    Initially the patient just wanted to sit here and get some rest. However, his vital signs to show that he is tachycardic. Heart rate  decreased from the initial 127 to low 100s. Again, patient does deny any drug use. I suggested that we get blood tests, given some IV fluids and recheck an EKG. Patient states he does not want to have these things done. He says he had an EKG done last night. I explained to the patient that his symptoms returned today and he does have an elevated heart rate this evening so I think it would be a good idea that investigate this further. His rapid heartbeat could be a marker of a more serious condition. Patient states he understands this but does not want to have any further testing. He is comfortable just needs to get some rest because he was out in the heat all day today. Patient was offered fluids orally. I instructed he can feel free to return at any time if he changes his mind and wants to be evaluated further  Sx most likely related to mild heat exhaustion.  He is homeless and the pt improved getting out of the heat.    Linwood DibblesJon Niveah Boerner, MD 08/03/15 [phone]

## 2015-08-03 NOTE — ED Notes (Signed)
Bed: WA03 Expected date:  Expected time:  Means of arrival:  Comments: 40 yo SOB

## 2015-08-03 NOTE — ED Provider Notes (Signed)
CSN: [id number]     Arrival date & time 08/03/15  2247 History  By signing my name below, I, Dustin Norris, attest that this documentation has been prepared under the direction and in the presence of Eber HongBrian Mahlia Fernando, MD . Electronically Signed: Majel HomerPeyton Norris, Scribe. 08/03/2015. 11:21 PM.  Chief Complaint  Patient presents with  . Headache   The history is provided by the patient. No language interpreter was used.   HPI Comments: Dustin Norris is a 40 y.o. male with PMHx of migraine, who presents to the Emergency Department complaining of gradually worsening, intermittent, headache to bilateral temples that began earlier today. Pt reports associated symptoms of intermittent nausea and slight blurry vision. Pt notes [facility] and was given tylenol with no relief. Pt denies fever and numbness or weakness in BLE and BUE.  Has been seen 2 X in last 24 hours prior to this for other minor complaitns - he has no other c/o at this time includding fevers / stiff neck or neuro c/o.  Past Medical History  Diagnosis Date  . Anxiety   . Asthma   . Homelessness   . Migraine    History reviewed. No pertinent past surgical history. History reviewed. No pertinent family history. Social History  Substance Use Topics  . Smoking status: Current Every Day Smoker -- 0.50 packs/day for 0 years    Types: Cigarettes  . Smokeless tobacco: Never Used  . Alcohol Use: No    Review of Systems  Constitutional: Negative for fever.  Gastrointestinal: Positive for nausea (intermittent).  Neurological: Positive for headaches. Negative for weakness and numbness.   Allergies  Review of patient's allergies indicates no known allergies.  Home Medications   Prior to Admission medications   Medication Sig Start Date End Date Taking? Authorizing Provider  albuterol (PROVENTIL HFA;VENTOLIN HFA) 108 (90 Base) MCG/ACT inhaler Inhale 2 puffs into the lungs every 4 (four) hours as  needed for wheezing or shortness of breath. Patient not taking: Reported on 08/03/2015 08/02/15   Roxy Horsemanobert Browning, PA-C  ibuprofen (ADVIL,MOTRIN) 800 MG tablet Take 1 tablet (800 mg total) by mouth 3 (three) times daily. 08/03/15   Eber HongBrian Loretta Doutt, MD  ondansetron (ZOFRAN) 4 MG tablet Take 1 tablet (4 mg total) by mouth every 8 (eight) hours as needed for nausea or vomiting. Patient not taking: Reported on 08/03/2015 07/04/15   Marily MemosJason Mesner, MD  penicillin v potassium (VEETID) 500 MG tablet Take 1 tablet (500 mg total) by mouth 2 (two) times daily. Patient not taking: Reported on 08/03/2015 06/22/15   Lona KettleAshley Laurel Meyer, PA-C   BP 144/91 mmHg  Pulse 85  Temp(Src) 98.4 F (36.9 C) (Oral)  Resp 19  SpO2 99% Physical Exam  Constitutional: He appears well-developed and well-nourished.  HENT:  Head: Normocephalic and atraumatic.  OP clear and moist  Eyes: Conjunctivae are normal. Right eye exhibits no discharge. Left eye exhibits no discharge.  Neck: Neck supple.  Pulmonary/Chest: Effort normal. No respiratory distress.  Lymphadenopathy:    He has no cervical adenopathy.  Neurological: He is alert. Coordination normal.  Neurologic exam:  Speech clear, pupils equal round reactive to light, extraocular movements intact  Normal peripheral visual fields Cranial nerves III through XII normal including no facial droop Follows commands, moves all extremities x4, normal strength to bilateral upper and lower extremities at all major muscle groups including grip Sensation normal to light touch and pinprick Coordination intact, no limb ataxia, finger-nose-finger  normal Rapid alternating movements normal No pronator drift Gait normal   Skin: Skin is warm and dry. No rash noted. He is not diaphoretic. No erythema.  Psychiatric: He has a normal mood and affect.  Nursing note and vitals reviewed.   ED Course  Procedures  DIAGNOSTIC STUDIES:  Oxygen Saturation is 99% on RA, normal by my interpretation.     COORDINATION OF CARE:  11:19 PM Discussed treatment plan with pt at bedside and pt agreed to plan.  Labs Review Labs Reviewed - No data to display  Imaging Review No results found. I have personally reviewed and evaluated these images and lab results as part of my medical decision-making.    MDM   Final diagnoses:  Nonintractable headache, unspecified chronicity pattern, unspecified headache type    Well appearing - no need fo testing - already given tylenol earlier in evening - offered motrin and d/c    I personally performed the services described in this documentation, which was scribed in my presence. The recorded information has been reviewed and is accurate.       Eber HongBrian Dave Mergen, MD 08/03/15 [phone]

## 2015-08-03 NOTE — ED Notes (Signed)
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 Patient denies any pain. Sats 100% on room air and lungs clear. Patient reported that he drank 4-5 bottles of water.

## 2015-08-03 NOTE — ED Notes (Signed)
Pt left at this time with all belongings.  

## 2015-08-03 NOTE — ED Notes (Signed)
PT refusing lab work

## 2015-08-04 ENCOUNTER — Emergency Department (HOSPITAL_COMMUNITY)
Admission: EM | Admit: 2015-08-04 | Discharge: 2015-08-04 | Disposition: A | Discharge status: Home / Self Care | Payer: Self-pay | Attending: Emergency Medicine | Admitting: Emergency Medicine

## 2015-08-04 ENCOUNTER — Encounter (HOSPITAL_COMMUNITY): Payer: Self-pay

## 2015-08-04 ENCOUNTER — Encounter (HOSPITAL_COMMUNITY): Payer: Self-pay | Admitting: *Deleted

## 2015-08-04 DIAGNOSIS — R519 Headache, unspecified: Secondary | ICD-10-CM

## 2015-08-04 DIAGNOSIS — J45909 Unspecified asthma, uncomplicated: Secondary | ICD-10-CM | POA: Insufficient documentation

## 2015-08-04 DIAGNOSIS — R51 Headache: Secondary | ICD-10-CM | POA: Insufficient documentation

## 2015-08-04 DIAGNOSIS — G43909 Migraine, unspecified, not intractable, without status migrainosus: Secondary | ICD-10-CM | POA: Insufficient documentation

## 2015-08-04 DIAGNOSIS — Z79899 Other long term (current) drug therapy: Secondary | ICD-10-CM | POA: Insufficient documentation

## 2015-08-04 DIAGNOSIS — F17210 Nicotine dependence, cigarettes, uncomplicated: Secondary | ICD-10-CM | POA: Insufficient documentation

## 2015-08-04 MED ORDER — ACETAMINOPHEN 325 MG PO TABS
650.0000 mg | ORAL_TABLET | Freq: Once | ORAL | Status: AC
  Administered 2015-08-04: 650 mg via ORAL
  Filled 2015-08-04: qty 2

## 2015-08-04 NOTE — ED Notes (Signed)
Patient presents with c/o migraine headache with the history of the same

## 2015-08-04 NOTE — ED Provider Notes (Signed)
CSN: [id number]     Arrival date & time 08/04/15  0750 History   First MD Initiated Contact with Patient 08/04/15 [phone]     No chief complaint on file.   HPI Comments: 40 year old male presents with a HA. PMH significant for frequent ED visits and chronic HA. He is homeless. Patient states he has been working outside a lot recently in the sun which has exacerbated his headache. The headache is in the front and temples. Feels like a pressure and is constant. He is requesting Tylenol and ginger ale. He reports his HA is exactly the same as his usual HA. Denies fever, syncope, dizziness, vision changes, neck pain, weakness, paresthesias, N/V.    Past Medical History  Diagnosis Date  . Anxiety   . Asthma   . Homelessness   . Migraine    History reviewed. No pertinent past surgical history. No family history on file. Social History  Substance Use Topics  . Smoking status: Current Every Day Smoker -- 0.50 packs/day for 0 years    Types: Cigarettes  . Smokeless tobacco: Never Used  . Alcohol Use: No    Review of Systems  Constitutional: Negative for fever.  Musculoskeletal: Negative for neck pain.  Neurological: Positive for headaches. Negative for dizziness, tremors, seizures, syncope, speech difficulty, weakness, light-headedness and numbness.  All other systems reviewed and are negative.     Allergies  Review of patient's allergies indicates no known allergies.  Home Medications   Prior to Admission medications   Medication Sig Start Date End Date Taking? Authorizing Provider  albuterol (PROVENTIL HFA;VENTOLIN HFA) 108 (90 Base) MCG/ACT inhaler Inhale 2 puffs into the lungs every 4 (four) hours as needed for wheezing or shortness of breath. 08/02/15  Yes Roxy Horsemanobert Browning, PA-C  ibuprofen (ADVIL,MOTRIN) 800 MG tablet Take 1 tablet (800 mg total) by mouth 3 (three) times daily. 08/03/15  Yes Eber HongBrian Miller, MD  ondansetron (ZOFRAN) 4 MG tablet Take 1 tablet (4 mg total) by mouth every 8  (eight) hours as needed for nausea or vomiting. Patient not taking: Reported on 08/03/2015 07/04/15   Marily MemosJason Mesner, MD  penicillin v potassium (VEETID) 500 MG tablet Take 1 tablet (500 mg total) by mouth 2 (two) times daily. Patient not taking: Reported on 08/03/2015 06/22/15   Lona KettleAshley Laurel Meyer, PA-C   BP 125/76 mmHg  Pulse 54  Temp(Src) 97.7 F (36.5 C) (Oral)  Resp 14  Ht 5\' 9"  (1.753 m)  Wt 65.772 kg  BMI 21.40 kg/m2  SpO2 100%   Physical Exam  Constitutional: He is oriented to person, place, and time. He appears well-developed and well-nourished. No distress.  HENT:  Head: Normocephalic and atraumatic.  Eyes: Conjunctivae are normal. Pupils are equal, round, and reactive to light. Right eye exhibits no discharge. Left eye exhibits no discharge. No scleral icterus.  Neck: Normal range of motion. Neck supple.  Cardiovascular: Normal rate and regular rhythm.  Exam reveals no gallop and no friction rub.   No murmur heard. Pulmonary/Chest: Effort normal and breath sounds normal. No respiratory distress. He has no wheezes. He has no rales. He exhibits no tenderness.  Abdominal: Soft. He exhibits no distension.  Neurological: He is alert and oriented to person, place, and time.  No focal neuro deficits, 5/5 strength bilaterally in upper and lower extremities, normal gait  Skin: Skin is warm and dry.  Psychiatric: He has a normal mood and affect.    ED Course  Procedures (including critical care time)  Labs Review Labs Reviewed - No data to display  Imaging Review No results found. I have personally reviewed and evaluated these images and lab results as part of my medical decision-making.   EKG Interpretation None      MDM   Final diagnoses:  Nonintractable episodic headache, unspecified headache type   40 year old male presents with acute on chronic headache. Patient is requesting Tylenol and ginger ale which was provided. Patient is NAD, non-toxic, with stable VS. Patient  is informed of clinical course, understands medical decision making process, and agrees with plan. Opportunity for questions provided and all questions answered. Return precautions given.      Bethel BornKelly Marie Dove [place], PA-C 08/05/15 1236  Laurence Spatesachel Morgan Little, MD 08/05/15 [phone]

## 2015-08-04 NOTE — ED Notes (Signed)
Patient complains of headache x 1 day. Denies trauma. Hx of migraines. Alert and oriented. NAD

## 2015-08-05 ENCOUNTER — Encounter (HOSPITAL_COMMUNITY): Payer: Self-pay | Admitting: Emergency Medicine

## 2015-08-05 ENCOUNTER — Emergency Department (HOSPITAL_COMMUNITY)
Admission: EM | Admit: 2015-08-05 | Discharge: 2015-08-05 | Disposition: A | Discharge status: Home / Self Care | Payer: Self-pay | Attending: Emergency Medicine | Admitting: Emergency Medicine

## 2015-08-05 DIAGNOSIS — Z79899 Other long term (current) drug therapy: Secondary | ICD-10-CM | POA: Insufficient documentation

## 2015-08-05 DIAGNOSIS — R51 Headache: Secondary | ICD-10-CM

## 2015-08-05 DIAGNOSIS — J45909 Unspecified asthma, uncomplicated: Secondary | ICD-10-CM | POA: Insufficient documentation

## 2015-08-05 DIAGNOSIS — F17210 Nicotine dependence, cigarettes, uncomplicated: Secondary | ICD-10-CM | POA: Insufficient documentation

## 2015-08-05 DIAGNOSIS — R519 Headache, unspecified: Secondary | ICD-10-CM

## 2015-08-05 DIAGNOSIS — G43009 Migraine without aura, not intractable, without status migrainosus: Secondary | ICD-10-CM

## 2015-08-05 DIAGNOSIS — G43909 Migraine, unspecified, not intractable, without status migrainosus: Secondary | ICD-10-CM | POA: Insufficient documentation

## 2015-08-05 MED ORDER — METOCLOPRAMIDE HCL 10 MG PO TABS
10.0000 mg | ORAL_TABLET | Freq: Once | ORAL | Status: AC
  Administered 2015-08-05: 10 mg via ORAL
  Filled 2015-08-05: qty 1

## 2015-08-05 MED ORDER — ACETAMINOPHEN 325 MG PO TABS: 325.0000 mg | ORAL_TABLET | Freq: Four times a day (QID) | ORAL | Status: DC | PRN

## 2015-08-05 MED ORDER — ACETAMINOPHEN 325 MG PO TABS
650.0000 mg | ORAL_TABLET | Freq: Once | ORAL | Status: AC
  Administered 2015-08-05: 650 mg via ORAL
  Filled 2015-08-05: qty 2

## 2015-08-05 MED ORDER — DEXAMETHASONE 4 MG PO TABS
6.0000 mg | ORAL_TABLET | Freq: Once | ORAL | Status: AC
Start: 2015-08-05 — End: 2015-08-05
  Administered 2015-08-05: 6 mg via ORAL
  Filled 2015-08-05: qty 2

## 2015-08-05 MED ORDER — ACETAMINOPHEN 500 MG PO TABS
1000.0000 mg | ORAL_TABLET | Freq: Once | ORAL | Status: AC
  Administered 2015-08-05: 1000 mg via ORAL
  Filled 2015-08-05: qty 2

## 2015-08-05 MED ORDER — DIPHENHYDRAMINE HCL 25 MG PO CAPS
25.0000 mg | ORAL_CAPSULE | Freq: Once | ORAL | Status: AC
  Administered 2015-08-05: 25 mg via ORAL
  Filled 2015-08-05: qty 1

## 2015-08-05 NOTE — Discharge Instructions (Signed)

## 2015-08-05 NOTE — ED Notes (Signed)
The pt haS BEEN SALEEP;ING FOR ONE HOUR AT LEAST  HE WAS RELUCTANT TO LEAVE.  HE IS GOING BACK OUT TO THE WAITING ROOM TO WAIT FOR AM BUS.  HE THREW HIS D/C PAPERS IN THE TRASH CAN

## 2015-08-05 NOTE — ED Provider Notes (Signed)
CSN: [id number]     Arrival date & time 08/04/15  2320 History   First MD Initiated Contact with Patient 08/05/15 0207     Chief Complaint  Patient presents with  . Migraine     (Consider location/radiation/quality/duration/timing/severity/associated sxs/prior Treatment) HPI  Patient's past medical history of homelessness, comes to the emergency department with complaints of recurrent headache. He was seen earlier today for the same and received Tylenol. He says Tylenol typically does help with his headaches. He is given a prescription for ibuprofen and therefore did not go to get it filled. He describes the headache is bitemporal and throbbing in nature. He says his pain is exacerbated by standing out in the hot sun and walking around. He is requesting a dose of Tylenol prescription for the same C [facility].   ROS: The patient denies confusion, diaphoresis, abdominal pains, N/V/D, gas, dysuria, abnormal  bleeding, genital discharge, fever, headache, weakness (general or focal), confusion, change of vision,  dysphagia, aphagia, shortness of breath, lower extremity swelling, rash, neck pain, chest pain, shortness of breath,  back pain.   Past Medical History  Diagnosis Date  . Anxiety   . Asthma   . Homelessness   . Migraine    History reviewed. No pertinent past surgical history. No family history on file. Social History  Substance Use Topics  . Smoking status: Current Every Day Smoker -- 0.50 packs/day for 0 years    Types: Cigarettes  . Smokeless tobacco: Never Used  . Alcohol Use: No    Review of Systems  Review of Systems All other systems negative except as documented in the HPI. All pertinent positives and negatives as reviewed in the HPI.   Allergies  Review of patient's allergies indicates no known allergies.  Home Medications   Prior to Admission medications   Medication Sig Start Date End Date Taking? Authorizing Provider  albuterol  (PROVENTIL HFA;VENTOLIN HFA) 108 (90 Base) MCG/ACT inhaler Inhale 2 puffs into the lungs every 4 (four) hours as needed for wheezing or shortness of breath. 08/02/15  Yes Roxy Horsemanobert Browning, PA-C  acetaminophen (TYLENOL) 325 MG tablet Take 1 tablet (325 mg total) by mouth every 6 (six) hours as needed. 08/05/15   Adriane Guglielmo Neva SeatGreene, PA-C  ibuprofen (ADVIL,MOTRIN) 800 MG tablet Take 1 tablet (800 mg total) by mouth 3 (three) times daily. 08/03/15   Eber HongBrian Miller, MD  ondansetron (ZOFRAN) 4 MG tablet Take 1 tablet (4 mg total) by mouth every 8 (eight) hours as needed for nausea or vomiting. Patient not taking: Reported on 08/03/2015 07/04/15   Marily MemosJason Mesner, MD  penicillin v potassium (VEETID) 500 MG tablet Take 1 tablet (500 mg total) by mouth 2 (two) times daily. Patient not taking: Reported on 08/03/2015 06/22/15   Lona KettleAshley Laurel Meyer, PA-C   BP 111/65 mmHg  Pulse 57  Temp(Src) 98.3 F (36.8 C) (Oral)  Resp 18  Ht 5\' 10"  (1.778 m)  Wt 65.772 kg  BMI 20.81 kg/m2  SpO2 99% Physical Exam  Constitutional: He appears well-developed and well-nourished. No distress.  HENT:  Head: Normocephalic and atraumatic.  Eyes: Pupils are equal, round, and reactive to light.  Neck: Normal range of motion. Neck supple.  Cardiovascular: Normal rate and regular rhythm.   Pulmonary/Chest: Effort normal.  Abdominal: Soft.  Neurological: He is alert.  Cranial nerves grossly intact on exam. Pt alert and oriented x 3 Upper and lower extremity strength is symmetrical and physiologic Normal muscular tone No facial  droop Coordination intact, no limb ataxia   Skin: Skin is warm and dry.  Nursing note and vitals reviewed.   ED Course  Procedures (including critical care time) Labs Review Labs Reviewed - No data to display  Imaging Review No results found. I have personally reviewed and evaluated these images and lab results as part of my medical decision-making.   EKG Interpretation None      MDM   Final diagnoses:   Nonintractable headache, unspecified chronicity pattern, unspecified headache type    Pt HA treated and improved while in ED.  Presentation is like pts typical HA and [facility], [facility], Meningitis, or temporal arteritis. Pt is afebrile with no focal neuro deficits, nuchal rigidity, or change in vision. Pt is to follow up with PCP to discuss prophylactic medication. Pt verbalizes understanding and is agreeable with plan to dc.   Medications  acetaminophen (TYLENOL) tablet 650 mg (not administered)   Rx: Tylenol   Marlon Peliffany Alexy Heldt, PA-C 08/05/15 0245  Tomasita CrumbleAdeleke Oni, MD 08/05/15 [phone]

## 2015-08-05 NOTE — ED Notes (Signed)
Patient Alert and oriented X4. Stable and ambulatory. Patient verbalized understanding of the discharge instructions.  Patient belongings were taken by the patient.  

## 2015-08-05 NOTE — ED Notes (Signed)
The pt is asleep 

## 2015-08-05 NOTE — ED Notes (Signed)
Pt given food and ginger ale at this time.

## 2015-08-05 NOTE — Discharge Instructions (Signed)

## 2015-08-05 NOTE — ED Provider Notes (Signed)
CSN: [id number]     Arrival date & time 08/05/15  1859 History   First MD Initiated Contact with Patient 08/05/15 1923     Chief Complaint  Patient presents with  . Headache     (Consider location/radiation/quality/duration/timing/severity/associated sxs/prior Treatment) Patient is a 40 y.o. male presenting with migraines. The history is provided by the patient.  Migraine This is a recurrent problem. The current episode started today. The problem occurs every several days. The problem has been unchanged. Associated symptoms include abdominal pain and headaches. Pertinent negatives include no chest pain, chills, congestion, coughing, diaphoresis, fatigue, fever, nausea, neck pain, numbness, rash, vertigo, vomiting or weakness.    Past Medical History  Diagnosis Date  . Anxiety   . Asthma   . Homelessness   . Migraine    History reviewed. No pertinent past surgical history. No family history on file. Social History  Substance Use Topics  . Smoking status: Current Every Day Smoker -- 0.00 packs/day for 0 years    Types: Cigarettes  . Smokeless tobacco: Never Used  . Alcohol Use: No    Review of Systems  Constitutional: Negative for fever, chills, diaphoresis and fatigue.  HENT: Negative for congestion.   Eyes: Positive for photophobia. Negative for visual disturbance.  Respiratory: Negative for cough.   Cardiovascular: Negative for chest pain.  Gastrointestinal: Positive for abdominal pain. Negative for nausea and vomiting.  Musculoskeletal: Negative for neck pain.  Skin: Negative for rash.  Neurological: Positive for headaches. Negative for dizziness, vertigo, syncope, speech difficulty, weakness and numbness.  Psychiatric/Behavioral: Negative for confusion and agitation.      Allergies  Review of patient's allergies indicates no known allergies.  Home Medications   Prior to Admission medications   Medication Sig Start Date End Date Taking? Authorizing Provider   acetaminophen (TYLENOL) 325 MG tablet Take 1 tablet (325 mg total) by mouth every 6 (six) hours as needed. 08/05/15   Tiffany Neva SeatGreene, PA-C  albuterol (PROVENTIL HFA;VENTOLIN HFA) 108 (90 Base) MCG/ACT inhaler Inhale 2 puffs into the lungs every 4 (four) hours as needed for wheezing or shortness of breath. 08/02/15   Roxy Horsemanobert Browning, PA-C  ibuprofen (ADVIL,MOTRIN) 800 MG tablet Take 1 tablet (800 mg total) by mouth 3 (three) times daily. 08/03/15   Eber HongBrian Miller, MD  ondansetron (ZOFRAN) 4 MG tablet Take 1 tablet (4 mg total) by mouth every 8 (eight) hours as needed for nausea or vomiting. Patient not taking: Reported on 08/03/2015 07/04/15   Marily MemosJason Mesner, MD  penicillin v potassium (VEETID) 500 MG tablet Take 1 tablet (500 mg total) by mouth 2 (two) times daily. Patient not taking: Reported on 08/03/2015 06/22/15   Lona KettleAshley Laurel Meyer, PA-C   BP 122/68 mmHg  Pulse 76  Temp(Src) 98.4 F (36.9 C) (Oral)  Resp 18  Ht 5\' 9"  (1.753 m)  Wt 65.913 kg  BMI 21.45 kg/m2  SpO2 99% Physical Exam  Constitutional: He is oriented to person, place, and time. He appears well-developed and well-nourished. No distress.  HENT:  Head: Normocephalic and atraumatic.  Eyes: Conjunctivae are normal.  Cardiovascular: Normal rate and normal heart sounds.   No murmur heard. Pulmonary/Chest: Effort normal and breath sounds normal.  Abdominal: Soft. There is no tenderness.  Musculoskeletal: He exhibits no edema.  Neurological: He is alert and oriented to person, place, and time.  Ambulates with normal gait  Skin: Skin is warm. He is not diaphoretic.  Psychiatric: He has a normal mood and affect. His behavior is normal.  Nursing note and vitals reviewed.   ED Course  Procedures (including critical care time) Labs Review Labs Reviewed - No data to display  Imaging Review No results found. I have personally reviewed and evaluated these images and lab results as part of my medical decision-making.   EKG  Interpretation None      MDM   Final diagnoses:  None    Patient presents for generalized headache similar to the one he was seen for last night. After being discharged early in the morning he went to work and developed a similar headache. Patient requesting Tylenol on arrival. Patient given Decadron Reglan Tylenol and Benadryl by mouth with partial relief of his headache. He was discharged home in good condition with plans to follow up with PCP regarding that further migraine control.  Low concern for intracranial lesion causing headaches at this time. He has a nonfocal neuro exam and history of similar migraines.  Patient seen with Dr. Derrill CenterGlick    Fleda Pagel, MD 08/06/15 [id number]  Dione Boozeavid Glick, MD 08/06/15 2241

## 2015-08-05 NOTE — ED Notes (Signed)
Pt request water. Ice water given to pt.

## 2015-08-05 NOTE — ED Notes (Signed)
Pt. reports persistent headache onset today sen here this morning for the same complaint , denies emesis / no blurred vision , mild photophobia .

## 2015-08-14 ENCOUNTER — Emergency Department (HOSPITAL_COMMUNITY)
Admission: EM | Admit: 2015-08-14 | Discharge: 2015-08-15 | Disposition: A | Discharge status: Home / Self Care | Payer: Self-pay | Attending: Emergency Medicine | Admitting: Emergency Medicine

## 2015-08-14 ENCOUNTER — Emergency Department (HOSPITAL_COMMUNITY)
Admission: EM | Admit: 2015-08-14 | Discharge: 2015-08-14 | Disposition: A | Discharge status: Home / Self Care | Payer: Self-pay | Attending: Emergency Medicine | Admitting: Emergency Medicine

## 2015-08-14 ENCOUNTER — Encounter (HOSPITAL_COMMUNITY): Payer: Self-pay | Admitting: Emergency Medicine

## 2015-08-14 DIAGNOSIS — F17210 Nicotine dependence, cigarettes, uncomplicated: Secondary | ICD-10-CM | POA: Insufficient documentation

## 2015-08-14 DIAGNOSIS — J45909 Unspecified asthma, uncomplicated: Secondary | ICD-10-CM | POA: Insufficient documentation

## 2015-08-14 DIAGNOSIS — R519 Headache, unspecified: Secondary | ICD-10-CM

## 2015-08-14 DIAGNOSIS — R51 Headache: Secondary | ICD-10-CM | POA: Insufficient documentation

## 2015-08-14 DIAGNOSIS — R5383 Other fatigue: Secondary | ICD-10-CM | POA: Insufficient documentation

## 2015-08-14 DIAGNOSIS — Z79899 Other long term (current) drug therapy: Secondary | ICD-10-CM | POA: Insufficient documentation

## 2015-08-14 MED ORDER — DIPHENHYDRAMINE HCL 50 MG/ML IJ SOLN: 25.0000 mg | Freq: Once | INTRAMUSCULAR | Status: DC

## 2015-08-14 MED ORDER — METOCLOPRAMIDE HCL 10 MG PO TABS
10.0000 mg | ORAL_TABLET | Freq: Once | ORAL | Status: AC
  Administered 2015-08-14: 10 mg via ORAL
  Filled 2015-08-14: qty 1

## 2015-08-14 MED ORDER — METOCLOPRAMIDE HCL 5 MG/ML IJ SOLN: 10.0000 mg | Freq: Once | INTRAMUSCULAR | Status: DC

## 2015-08-14 MED ORDER — DIPHENHYDRAMINE HCL 25 MG PO CAPS
25.0000 mg | ORAL_CAPSULE | Freq: Once | ORAL | Status: AC
  Administered 2015-08-14: 25 mg via ORAL
  Filled 2015-08-14: qty 1

## 2015-08-14 MED ORDER — KETOROLAC TROMETHAMINE 30 MG/ML IJ SOLN: 30.0000 mg | Freq: Once | INTRAMUSCULAR | Status: DC

## 2015-08-14 MED ORDER — DEXAMETHASONE 4 MG PO TABS
6.0000 mg | ORAL_TABLET | Freq: Once | ORAL | Status: AC
Start: 2015-08-14 — End: 2015-08-14
  Administered 2015-08-14: 6 mg via ORAL
  Filled 2015-08-14: qty 2

## 2015-08-14 NOTE — ED Notes (Signed)
The pt is here with a headache all day  He was sleeping in the waiting room he had to be awakened to bring back.  He has not taken anything for his headache  He reports that he could not afford it

## 2015-08-14 NOTE — ED Notes (Signed)
Pt. reports headache and fatigue onset today , denies fever , pt. stated he has been walking a lot today .

## 2015-08-14 NOTE — ED Notes (Signed)
Pt sleeping soundly until i woke him up.  He took the pills thn went back to sleep

## 2015-08-14 NOTE — ED Notes (Signed)
Pt is c/o migraine headache and states he needs his inhalers refilled

## 2015-08-14 NOTE — ED Provider Notes (Signed)
CSN: [id number]     Arrival date & time 08/14/15  0008 History   First MD Initiated Contact with Patient 08/14/15 0321     Chief Complaint  Patient presents with  . Headache  . Fatigue     (Consider location/radiation/quality/duration/timing/severity/associated sxs/prior Treatment) HPI Comments: Patient presents today with a chief complaint of a frontal headache.  He reports onset of headache this morning.  Headache has been constant since that time.  Headache gradual in onset.  He reports history of Migraine Headaches and states that this headache feels similar to headaches he has had in the past.  He denies acute head injury or trauma.  Denies nausea, vomiting, vision changes, neck pain/stiffness, fever, chills, focal weakness, numbness, or tingling.  He has not taken anything for the headache prior to arrival.  Review of the chart shows that the patient has numerous ED visits for similar presentation.  The history is provided by the patient.    Past Medical History  Diagnosis Date  . Anxiety   . Asthma   . Homelessness   . Migraine    No past surgical history on file. No family history on file. Social History  Substance Use Topics  . Smoking status: Current Every Day Smoker -- 0.00 packs/day for 0 years    Types: Cigarettes  . Smokeless tobacco: Never Used  . Alcohol Use: No    Review of Systems  All other systems reviewed and are negative.     Allergies  Review of patient's allergies indicates no known allergies.  Home Medications   Prior to Admission medications   Medication Sig Start Date End Date Taking? Authorizing Provider  acetaminophen (TYLENOL) 325 MG tablet Take 1 tablet (325 mg total) by mouth every 6 (six) hours as needed. Patient taking differently: Take 325-650 mg by mouth every 6 (six) hours as needed for mild pain or headache.  08/05/15   Marlon Pel, PA-C  albuterol (PROVENTIL HFA;VENTOLIN HFA) 108 (90 Base) MCG/ACT inhaler Inhale 2 puffs into the  lungs every 4 (four) hours as needed for wheezing or shortness of breath. 08/02/15   Roxy Horseman, PA-C  ibuprofen (ADVIL,MOTRIN) 800 MG tablet Take 1 tablet (800 mg total) by mouth 3 (three) times daily. Patient taking differently: Take 800 mg by mouth every 8 (eight) hours as needed for headache or mild pain.  08/03/15   Eber Hong, MD  ondansetron (ZOFRAN) 4 MG tablet Take 1 tablet (4 mg total) by mouth every 8 (eight) hours as needed for nausea or vomiting. Patient not taking: Reported on 08/03/2015 07/04/15   Marily Memos, MD  penicillin v potassium (VEETID) 500 MG tablet Take 1 tablet (500 mg total) by mouth 2 (two) times daily. Patient not taking: Reported on 08/03/2015 06/22/15   Lona Kettle, PA-C   BP 118/71 mmHg  Pulse 95  Temp(Src) 98.8 F (37.1 C) (Oral)  Resp 16  Ht  (1.727 m)  Wt 67.132 kg  BMI 22.51 kg/m2  SpO2 97% Physical Exam  Constitutional: He appears well-developed and well-nourished.  HENT:  Head: Normocephalic and atraumatic.  Mouth/Throat: Oropharynx is clear and moist.  Eyes: EOM are normal. Pupils are equal, round, and reactive to light.  Neck: Normal range of motion. Neck supple.  No nuchal rigidity  Cardiovascular: Normal rate, regular rhythm and normal heart sounds.   Pulmonary/Chest: Effort normal and breath sounds normal.  Musculoskeletal: Normal range of motion.  Neurological: He is alert. He has normal strength. No sensory deficit. Coordination  and gait normal.  Skin: Skin is warm and dry.  Psychiatric: He has a normal mood and affect.  Nursing note and vitals reviewed.   ED Course  Procedures (including critical care time) Labs Review Labs Reviewed - No data to display  Imaging Review No results found. I have personally reviewed and evaluated these images and lab results as part of my medical decision-making.   EKG Interpretation None     5:08 AM Reassessed patient.  He is currently sleeping and resting comfortably. MDM    Final diagnoses:  None   Pt HA treated and improved while in ED.  Presentation is like pts typical HA and non concerning for [facility], ICH, Meningitis, or temporal arteritis.  He has numerous ED visits for similar headache.  Pt is afebrile with no focal neuro deficits, nuchal rigidity, or change in vision. Pt is to follow up with PCP to discuss prophylactic medication. Pt verbalizes understanding and is agreeable with plan to dc. Return precautions given.       Santiago GladHeather Solae Norling, PA-C 08/14/15 [id number]  Dione Boozeavid Glick, MD 08/14/15 [phone]

## 2015-08-14 NOTE — ED Notes (Signed)
The pt is homeless and frequents the ed

## 2015-08-14 NOTE — ED Notes (Signed)
Pt refusing iv after uncovering his head. This is his usual practice.

## 2015-08-15 MED ORDER — ALBUTEROL SULFATE HFA 108 (90 BASE) MCG/ACT IN AERS: 2.0000 | INHALATION_SPRAY | RESPIRATORY_TRACT | Status: DC | PRN

## 2015-08-15 MED ORDER — IBUPROFEN 800 MG PO TABS
800.0000 mg | ORAL_TABLET | Freq: Once | ORAL | Status: AC
  Administered 2015-08-15: 800 mg via ORAL
  Filled 2015-08-15: qty 1

## 2015-08-15 MED ORDER — DIPHENHYDRAMINE HCL 25 MG PO CAPS
50.0000 mg | ORAL_CAPSULE | Freq: Once | ORAL | Status: AC
  Administered 2015-08-15: 50 mg via ORAL
  Filled 2015-08-15: qty 2

## 2015-08-15 MED ORDER — METOCLOPRAMIDE HCL 10 MG PO TABS
10.0000 mg | ORAL_TABLET | Freq: Once | ORAL | Status: AC
  Administered 2015-08-15: 10 mg via ORAL
  Filled 2015-08-15: qty 1

## 2015-08-15 NOTE — Discharge Instructions (Signed)
Return to the ED with any concerns including vomiting and not able to keep down liquids, changes in vision or speech, weakness of arms or legs, fainting, decreased level of alertness/lethargy, or any other alarming symptoms °

## 2015-08-15 NOTE — ED Provider Notes (Signed)
CSN: [id number]     Arrival date & time 08/14/15  2344 History  By signing my name below, I, Dustin Norris, attest that this documentation has been prepared under the direction and in the presence of Jerelyn ScottMartha Linker, MD. Electronically Signed: Rosario AdieWilliam Andrew Norris, ED Scribe. 08/15/2015. 3:15 AM.   Chief Complaint  Patient presents with  . Migraine   Patient is a 40 y.o. male presenting with migraines. The history is provided by the patient. No language interpreter was used.  Migraine This is a recurrent problem. The current episode started yesterday. The problem has not changed since onset.Associated symptoms include headaches. Pertinent negatives include no shortness of breath. Nothing relieves the symptoms. Treatments tried: Tylenol, Benadryl, Decadron, Reglan. The treatment provided no relief.   HPI Comments: Dustin Norris is a 40 y.o. male with a PMHx of asthma, anxiety, migraines, and homelessness who presents to the Emergency Department complaining of gradual onset, constant, unchanged frontal headache onset 1 day ago. Pt was seen in the ED ~24 hours ago for similar symptoms where he was given Decadron, Reglan, Tylenol, and Benadryl which he notes did not relieve his headache. He reports that his pain is similar to his pain when he was in the ED 1 day ago, and is similar to his hx of migraines. No other OTC pain medications or home remedies tried PTA. He also states that he would like to have a prescription to have his Albuterol inhalers refilled. He denies wheezing, or any other symptoms at this time.  No changes in vision or speech.  No weakness of arms or legs.  No vomiting.  There are no other associated systemic symptoms, there are no other alleviating or modifying factors.   Past Medical History  Diagnosis Date  . Anxiety   . Asthma   . Homelessness   . Migraine    History reviewed. No pertinent past surgical history. History reviewed. No pertinent family history. Social  History  Substance Use Topics  . Smoking status: Current Every Day Smoker -- 0.00 packs/day for 0 years    Types: Cigarettes, Cigars  . Smokeless tobacco: Never Used  . Alcohol Use: No    Review of Systems  Respiratory: Negative for shortness of breath and wheezing.   Neurological: Positive for headaches.  All other systems reviewed and are negative.  Allergies  Review of patient's allergies indicates no known allergies.  Home Medications   Prior to Admission medications   Medication Sig Start Date End Date Taking? Authorizing Provider  acetaminophen (TYLENOL) 325 MG tablet Take 1 tablet (325 mg total) by mouth every 6 (six) hours as needed. Patient taking differently: Take 325-650 mg by mouth every 6 (six) hours as needed for mild pain or headache.  08/05/15  Yes Tiffany Neva SeatGreene, PA-C  ibuprofen (ADVIL,MOTRIN) 800 MG tablet Take 1 tablet (800 mg total) by mouth 3 (three) times daily. Patient taking differently: Take 800 mg by mouth every 8 (eight) hours as needed for headache or mild pain.  08/03/15  Yes Eber HongBrian Miller, MD  albuterol (PROVENTIL HFA;VENTOLIN HFA) 108 (90 Base) MCG/ACT inhaler Inhale 2 puffs into the lungs every 4 (four) hours as needed for wheezing or shortness of breath. 08/15/15   Jerelyn ScottMartha Linker, MD  ondansetron (ZOFRAN) 4 MG tablet Take 1 tablet (4 mg total) by mouth every 8 (eight) hours as needed for nausea or vomiting. Patient not taking: Reported on 08/03/2015 07/04/15   Marily MemosJason Mesner, MD  penicillin v potassium (VEETID) 500 MG tablet Take  1 tablet (500 mg total) by mouth 2 (two) times daily. Patient not taking: Reported on 08/03/2015 06/22/15   Lona Kettle, PA-C   BP 118/64 mmHg  Pulse 91  Temp(Src) 97.7 F (36.5 C) (Oral)  Resp 20  Ht  (1.778 m)  Wt 147 lb (66.679 kg)  BMI 21.09 kg/m2  SpO2 100%  Vitals reviewed Physical Exam  Physical Examination: General appearance - alert, well appearing, and in no distress Mental status - alert, oriented to person,  place, and time Eyes - pupils equal and reactive, extraocular eye movements intact Mouth - mucous membranes moist, pharynx normal without lesions Chest - clear to auscultation, no wheezes, rales or rhonchi, symmetric air entry Heart - normal rate, regular rhythm, normal S1, S2, no murmurs, rubs, clicks or gallops Abdomen - soft, nontender, nondistended, no masses or organomegaly Neurological - alert, oriented x 3, cranial nerves 2-12 tested and intact, strength 5/5 in extremities x 4, sensation intact Extremities - peripheral pulses normal, no pedal edema, no clubbing or cyanosis Skin - normal coloration and turgor, no rashes  ED Course  Procedures (including critical care time)  DIAGNOSTIC STUDIES: Oxygen Saturation is 100% on RA, normal by my interpretation.   COORDINATION OF CARE: 3:15 AM-Discussed next steps with pt. Pt verbalized understanding and is agreeable with the plan.   MDM   Final diagnoses:  Nonintractable headache    Pt presenting with c/o headache, similar to his prior migraines, headache was relieved this morning after treatment in the ED but returned gradually throughout the day today.  No signs or symptoms of meningitis, SAH.  Pt has normal neurologic exam.  He was treated with migraine cocktail meds.  He also requests refill of his albuterol inhaler.  He states he will be able to get the prescription filled tomorrow after he gets pain.  No acute shortness of breath or wheezing in the ED tonight.  Discharged with strict return precautions.  Pt agreeable with plan.   I personally performed the services described in this documentation, which was scribed in my presence. The recorded information has been reviewed and is accurate.       Jerelyn Scott, MD 08/15/15 [phone]

## 2015-11-22 ENCOUNTER — Observation Stay (HOSPITAL_COMMUNITY)
Admission: EM | Admit: 2015-11-22 | Discharge: 2015-11-23 | Disposition: A | Discharge status: Home / Self Care | Payer: Self-pay | Attending: Internal Medicine | Admitting: Internal Medicine

## 2015-11-22 ENCOUNTER — Encounter (HOSPITAL_COMMUNITY): Payer: Self-pay | Admitting: Emergency Medicine

## 2015-11-22 DIAGNOSIS — F1410 Cocaine abuse, uncomplicated: Secondary | ICD-10-CM | POA: Insufficient documentation

## 2015-11-22 DIAGNOSIS — Z711 Person with feared health complaint in whom no diagnosis is made: Secondary | ICD-10-CM

## 2015-11-22 DIAGNOSIS — R079 Chest pain, unspecified: Secondary | ICD-10-CM

## 2015-11-22 DIAGNOSIS — Z202 Contact with and (suspected) exposure to infections with a predominantly sexual mode of transmission: Secondary | ICD-10-CM | POA: Insufficient documentation

## 2015-11-22 DIAGNOSIS — F17210 Nicotine dependence, cigarettes, uncomplicated: Secondary | ICD-10-CM | POA: Insufficient documentation

## 2015-11-22 DIAGNOSIS — R0789 Other chest pain: Principal | ICD-10-CM | POA: Insufficient documentation

## 2015-11-22 DIAGNOSIS — R7989 Other specified abnormal findings of blood chemistry: Secondary | ICD-10-CM | POA: Diagnosis present

## 2015-11-22 DIAGNOSIS — Z720 Tobacco use: Secondary | ICD-10-CM | POA: Diagnosis present

## 2015-11-22 DIAGNOSIS — Z79899 Other long term (current) drug therapy: Secondary | ICD-10-CM | POA: Insufficient documentation

## 2015-11-22 DIAGNOSIS — J45909 Unspecified asthma, uncomplicated: Secondary | ICD-10-CM | POA: Insufficient documentation

## 2015-11-22 HISTORY — DX: Tobacco use: Z72.0

## 2015-11-22 HISTORY — DX: Cocaine abuse, uncomplicated: F14.10

## 2015-11-22 MED ORDER — LORAZEPAM 2 MG/ML IJ SOLN
1.0000 mg | Freq: Once | INTRAMUSCULAR | Status: AC
  Administered 2015-11-23: 1 mg via INTRAVENOUS
  Filled 2015-11-22: qty 1

## 2015-11-22 MED ORDER — SODIUM CHLORIDE 0.9 % IV BOLUS (SEPSIS)
1000.0000 mL | Freq: Once | INTRAVENOUS | Status: AC
  Administered 2015-11-23: 1000 mL via INTRAVENOUS

## 2015-11-22 NOTE — ED Notes (Signed)
Attempted to do EKG on arrival.  Patient requested that we step out so he can remove his underwear.  This took several minutes.  EKG obtained as soon as he would allow me back in the room.
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
## 2015-11-22 NOTE — ED Notes (Signed)
Again requesting that I step out and give him some time to collect a urine.  Call bell within reach.  Encouraged to call for assistance as needed.
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
## 2015-11-23 ENCOUNTER — Encounter (HOSPITAL_COMMUNITY): Payer: Self-pay | Admitting: Emergency Medicine

## 2015-11-23 ENCOUNTER — Emergency Department (HOSPITAL_COMMUNITY): Payer: Self-pay

## 2015-11-23 ENCOUNTER — Encounter (HOSPITAL_COMMUNITY): Payer: Self-pay | Admitting: Internal Medicine

## 2015-11-23 DIAGNOSIS — J45909 Unspecified asthma, uncomplicated: Secondary | ICD-10-CM | POA: Diagnosis present

## 2015-11-23 DIAGNOSIS — R0789 Other chest pain: Secondary | ICD-10-CM | POA: Insufficient documentation

## 2015-11-23 DIAGNOSIS — F17210 Nicotine dependence, cigarettes, uncomplicated: Secondary | ICD-10-CM | POA: Insufficient documentation

## 2015-11-23 DIAGNOSIS — Z711 Person with feared health complaint in whom no diagnosis is made: Secondary | ICD-10-CM

## 2015-11-23 DIAGNOSIS — R071 Chest pain on breathing: Secondary | ICD-10-CM

## 2015-11-23 DIAGNOSIS — F1410 Cocaine abuse, uncomplicated: Secondary | ICD-10-CM | POA: Diagnosis present

## 2015-11-23 DIAGNOSIS — R51 Headache: Secondary | ICD-10-CM | POA: Insufficient documentation

## 2015-11-23 DIAGNOSIS — R7989 Other specified abnormal findings of blood chemistry: Secondary | ICD-10-CM

## 2015-11-23 DIAGNOSIS — I200 Unstable angina: Secondary | ICD-10-CM

## 2015-11-23 DIAGNOSIS — J4520 Mild intermittent asthma, uncomplicated: Secondary | ICD-10-CM

## 2015-11-23 DIAGNOSIS — R079 Chest pain, unspecified: Secondary | ICD-10-CM | POA: Diagnosis present

## 2015-11-23 DIAGNOSIS — Z720 Tobacco use: Secondary | ICD-10-CM

## 2015-11-23 LAB — BASIC METABOLIC PANEL
ANION GAP: 8 (ref 5–15)
BUN: 8 mg/dL (ref 6–20)
CALCIUM: 8.9 mg/dL (ref 8.9–10.3)
CO2: 25 mmol/L (ref 22–32)
CREATININE: 1.01 mg/dL (ref 0.61–1.24)
Chloride: 104 mmol/L (ref 101–111)
GFR calc Af Amer: >60 mL/min (ref >60)
GLUCOSE: 136 mg/dL — AB (ref 65–99)
Potassium: 3.8 mmol/L (ref 3.5–5.1)
Sodium: 137 mmol/L (ref 135–145)

## 2015-11-23 LAB — HEPATIC FUNCTION PANEL
ALBUMIN: 3.6 g/dL (ref 3.5–5.0)
ALT: 16 U/L — ABNORMAL LOW (ref 17–63)
AST: 21 U/L (ref 15–41)
Alkaline Phosphatase: 49 U/L (ref 38–126)
Bilirubin, Direct: <0.1 mg/dL — ABNORMAL LOW (ref 0.1–0.5)
TOTAL PROTEIN: 5.9 g/dL — AB (ref 6.5–8.1)
Total Bilirubin: 0.6 mg/dL (ref 0.3–1.2)

## 2015-11-23 LAB — CBC
HCT: 42.3 % (ref 39.0–52.0)
HEMATOCRIT: 41.3 % (ref 39.0–52.0)
HEMOGLOBIN: 13.5 g/dL (ref 13.0–17.0)
HEMOGLOBIN: 14 g/dL (ref 13.0–17.0)
MCH: 29.4 pg (ref 26.0–34.0)
MCH: 29.7 pg (ref 26.0–34.0)
MCHC: 32.7 g/dL (ref 30.0–36.0)
MCHC: 33.1 g/dL (ref 30.0–36.0)
MCV: 89.6 fL (ref 78.0–100.0)
MCV: 90 fL (ref 78.0–100.0)
PLATELETS: 286 10*3/uL (ref 150–400)
Platelets: 273 10*3/uL (ref 150–400)
RBC: 4.59 MIL/uL (ref 4.22–5.81)
RBC: 4.72 MIL/uL (ref 4.22–5.81)
RDW: 14.6 % (ref 11.5–15.5)
RDW: 14.9 % (ref 11.5–15.5)
WBC: 6.5 10*3/uL (ref 4.0–10.5)
WBC: 8.6 10*3/uL (ref 4.0–10.5)

## 2015-11-23 LAB — URINALYSIS, ROUTINE W REFLEX MICROSCOPIC
BILIRUBIN URINE: NEGATIVE
GLUCOSE, UA: NEGATIVE mg/dL
HGB URINE DIPSTICK: NEGATIVE
KETONES UR: NEGATIVE mg/dL
Leukocytes, UA: NEGATIVE
Nitrite: NEGATIVE
PH: 7 (ref 5.0–8.0)
PROTEIN: NEGATIVE mg/dL
Specific Gravity, Urine: 1.015 (ref 1.005–1.030)

## 2015-11-23 LAB — I-STAT TROPONIN, ED
TROPONIN I, POC: 0.01 ng/mL (ref 0.00–0.08)
Troponin i, poc: 0 ng/mL (ref 0.00–0.08)
Troponin i, poc: 0.02 ng/mL (ref 0.00–0.08)

## 2015-11-23 LAB — TROPONIN I

## 2015-11-23 LAB — HIV ANTIBODY (ROUTINE TESTING W REFLEX)
HIV Screen 4th Generation wRfx: NONREACTIVE
HIV Screen 4th Generation wRfx: NONREACTIVE

## 2015-11-23 LAB — RAPID URINE DRUG SCREEN, HOSP PERFORMED
AMPHETAMINES: NOT DETECTED
BARBITURATES: NOT DETECTED
Benzodiazepines: NOT DETECTED
Cocaine: POSITIVE — AB
Opiates: NOT DETECTED
TETRAHYDROCANNABINOL: NOT DETECTED

## 2015-11-23 LAB — LIPID PANEL
Cholesterol: 111 mg/dL (ref 0–200)
HDL: 61 mg/dL (ref >40)
LDL CALC: 46 mg/dL (ref 0–99)
Total CHOL/HDL Ratio: 1.8 RATIO
Triglycerides: 20 mg/dL (ref <150)
VLDL: 4 mg/dL (ref 0–40)

## 2015-11-23 LAB — I-STAT CG4 LACTIC ACID, ED
Lactic Acid, Venous: 1.89 mmol/L (ref 0.5–1.9)
Lactic Acid, Venous: 2.45 mmol/L (ref 0.5–1.9)

## 2015-11-23 LAB — RPR: RPR Ser Ql: NONREACTIVE

## 2015-11-23 LAB — ETHANOL

## 2015-11-23 LAB — LIPASE, BLOOD: LIPASE: 37 U/L (ref 11–51)

## 2015-11-23 MED ORDER — DM-GUAIFENESIN ER 30-600 MG PO TB12
1.0000 | ORAL_TABLET | Freq: Two times a day (BID) | ORAL | Status: DC
  Administered 2015-11-23: 1 via ORAL
  Filled 2015-11-23: qty 1

## 2015-11-23 MED ORDER — SODIUM CHLORIDE 0.9 % IV SOLN
INTRAVENOUS | Status: DC
  Administered 2015-11-23: 06:00:00 via INTRAVENOUS

## 2015-11-23 MED ORDER — SODIUM CHLORIDE 0.9 % IV BOLUS (SEPSIS): 1000.0000 mL | Freq: Once | INTRAVENOUS | Status: DC

## 2015-11-23 MED ORDER — STERILE WATER FOR INJECTION IJ SOLN
INTRAMUSCULAR | Status: AC
  Filled 2015-11-23: qty 10

## 2015-11-23 MED ORDER — ONDANSETRON HCL 4 MG/2ML IJ SOLN: 4.0000 mg | Freq: Four times a day (QID) | INTRAMUSCULAR | Status: DC | PRN

## 2015-11-23 MED ORDER — AZITHROMYCIN 250 MG PO TABS
1000.0000 mg | ORAL_TABLET | Freq: Once | ORAL | Status: AC
  Administered 2015-11-23: 1000 mg via ORAL
  Filled 2015-11-23: qty 4

## 2015-11-23 MED ORDER — ASPIRIN 325 MG PO TABS
325.0000 mg | ORAL_TABLET | Freq: Every day | ORAL | Status: DC
  Administered 2015-11-23: 325 mg via ORAL
  Filled 2015-11-23: qty 1

## 2015-11-23 MED ORDER — SODIUM CHLORIDE 0.9 % IV BOLUS (SEPSIS)
1000.0000 mL | Freq: Once | INTRAVENOUS | Status: AC
  Administered 2015-11-23: 1000 mL via INTRAVENOUS

## 2015-11-23 MED ORDER — ALBUTEROL SULFATE (2.5 MG/3ML) 0.083% IN NEBU: 2.5000 mg | INHALATION_SOLUTION | Freq: Four times a day (QID) | RESPIRATORY_TRACT | Status: DC | PRN

## 2015-11-23 MED ORDER — NICOTINE 21 MG/24HR TD PT24: 21.0000 mg | MEDICATED_PATCH | Freq: Every day | TRANSDERMAL | 0 refills | Status: DC

## 2015-11-23 MED ORDER — NITROGLYCERIN 0.4 MG SL SUBL: 0.4000 mg | SUBLINGUAL_TABLET | SUBLINGUAL | Status: DC | PRN

## 2015-11-23 MED ORDER — ENOXAPARIN SODIUM 40 MG/0.4ML ~~LOC~~ SOLN
40.0000 mg | SUBCUTANEOUS | Status: DC
  Filled 2015-11-23: qty 0.4

## 2015-11-23 MED ORDER — OXYCODONE-ACETAMINOPHEN 5-325 MG PO TABS: 1.0000 | ORAL_TABLET | ORAL | Status: DC | PRN

## 2015-11-23 MED ORDER — NICOTINE 21 MG/24HR TD PT24
21.0000 mg | MEDICATED_PATCH | Freq: Every day | TRANSDERMAL | Status: DC
  Filled 2015-11-23: qty 1

## 2015-11-23 MED ORDER — IOPAMIDOL (ISOVUE-370) INJECTION 76%
INTRAVENOUS | Status: AC
  Administered 2015-11-23: 100 mL
  Filled 2015-11-23: qty 100

## 2015-11-23 MED ORDER — CEFTRIAXONE SODIUM 250 MG IJ SOLR
250.0000 mg | Freq: Once | INTRAMUSCULAR | Status: DC
  Filled 2015-11-23: qty 250

## 2015-11-23 MED ORDER — ZOLPIDEM TARTRATE 5 MG PO TABS: 5.0000 mg | ORAL_TABLET | Freq: Every evening | ORAL | Status: DC | PRN

## 2015-11-23 MED ORDER — ACETAMINOPHEN 325 MG PO TABS: 650.0000 mg | ORAL_TABLET | ORAL | Status: DC | PRN

## 2015-11-23 MED ORDER — ALPRAZOLAM 0.25 MG PO TABS: 0.2500 mg | ORAL_TABLET | Freq: Two times a day (BID) | ORAL | Status: DC | PRN
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
## 2015-11-23 NOTE — ED Notes (Signed)
Taken to CT at this time. 

## 2015-11-23 NOTE — H&P (Signed)
History and Physical    Dustin Norris ZOX:[id number] DOB: [birth year] DOA: 11/22/2015  Referring MD/NP/PA:   PCP: No PCP Per Patient   Patient coming from:  The patient is coming from home.  At baseline, pt is independent for most of ADL.   Chief Complaint: chest pain and penile discomfort  HPI: Dustin MarvelLeonard Landry is a 40 y.o. male with medical history significant of asthma, homeless, anxiety, migraine headaches, tobacco abuse, who presents with chest pain and penile discomfort.  Pt states that he started having chest pain before lunch today. His chest pain is located in the substernal area, sharp, 10 out of 10 in severity, nonradiating. It is not aggravated or alleviated by any known factors. It is associated with shortness of breath. No tenderness over calf areas.  He has cough with yellow color sputum production. Per Triage report, pt had a three-way sexual encounter this morning, became nervous after he developed tingling around his urethra. EMS told RN that patient admitted to vaginal and anal sex with multiple partners this morning. His symptom has resolved currently. No Penile discharge. Patient denies nausea, vomiting, diarrhea, abdominal pain, symptoms of a UTI. No unilateral weakness.  ED Course: pt was found to have positive UDS for cocaine, negative troponin, WBC 8.6, negative urinalysis, lactate 2.45-->1.89, renal function okay, temperature normal, tachycardia, tachypnea, Chest x-ray showed asthmatic change. CTA of chest is negative for PE or infiltration.   Review of Systems:   General: no fevers, chills, no changes in body weight. HEENT: no blurry vision, hearing changes or sore throat Respiratory: has dyspnea, coughing, no wheezing CV: has chest pain, no palpitations GI: no nausea, vomiting, abdominal pain, diarrhea, constipation GU: no dysuria, burning on urination, increased urinary frequency, hematuria. Has  penile discomfort.  Ext: no leg edema Neuro: no unilateral  weakness, numbness, or tingling, no vision change or hearing loss Skin: no rash, no skin tear. MSK: No muscle spasm, no deformity, no limitation of range of movement in spin Heme: No easy bruising.  Travel history: No recent long distant travel.  Allergy: No Known Allergies  Past Medical History:  Diagnosis Date  . Anxiety   . Asthma   . Cocaine abuse   . Homelessness   . Migraine   . Tobacco abuse     History reviewed. No pertinent surgical history.  Social History:  reports that he has been smoking Cigarettes and Cigars.  He has been smoking about 0.00 packs per day for the past 0.00 years. He has never used smokeless tobacco. He reports that he does not drink alcohol or use drugs.  Family History: Reviewed with patient, but patient does not know any family medical history.   Prior to Admission medications   Medication Sig Start Date End Date Taking? Authorizing Provider  albuterol (PROVENTIL HFA;VENTOLIN HFA) 108 (90 Base) MCG/ACT inhaler Inhale 2 puffs into the lungs every 4 (four) hours as needed for wheezing or shortness of breath. 08/15/15  Yes Jerelyn ScottMartha Linker, MD  acetaminophen (TYLENOL) 325 MG tablet Take 1 tablet (325 mg total) by mouth every 6 (six) hours as needed. Patient not taking: Reported on 11/23/2015 08/05/15   Marlon Peliffany Greene, PA-C  ibuprofen (ADVIL,MOTRIN) 800 MG tablet Take 1 tablet (800 mg total) by mouth 3 (three) times daily. Patient not taking: Reported on 11/23/2015 08/03/15   Eber HongBrian Miller, MD  ondansetron (ZOFRAN) 4 MG tablet Take 1 tablet (4 mg total) by mouth every 8 (eight) hours as needed for nausea or vomiting. Patient not taking: Reported  on 11/23/2015 07/04/15   Marily Memos, MD  penicillin v potassium (VEETID) 500 MG tablet Take 1 tablet (500 mg total) by mouth 2 (two) times daily. Patient not taking: Reported on 11/23/2015 06/22/15   Lona Kettle, PA-C    Physical Exam: Vitals:   11/23/15 0354 11/23/15 0400 11/23/15 0415 11/23/15 0430  BP:  139/86 131/77 121/73 134/79  Pulse: 94 87 88 81  Resp: 19 (!) 28 13 20   Temp:      TempSrc:      SpO2: 99% 98% 99% 94%  Weight:      Height:       General: Not in acute distress HEENT:       Eyes: PERRL, EOMI, no scleral icterus.       ENT: No discharge from the ears and nose, no pharynx injection, no tonsillar enlargement.        Neck: No JVD, no bruit, no mass felt. Heme: No neck lymph node enlargement. Cardiac: S1/S2, RRR, No murmurs, No gallops or rubs. Respiratory: No rales, wheezing, rhonchi or rubs. GI: Soft, nondistended, nontender, no rebound pain, no organomegaly, BS present. GU: No hematuria Ext: No pitting leg edema bilaterally. 2+DP/PT pulse bilaterally. Musculoskeletal: No joint deformities, No joint redness or warmth, no limitation of ROM in spin. Skin: No rashes.  Neuro: Alert, oriented X3, cranial nerves II-XII grossly intact, moves all extremities normally. Psych: Patient is not psychotic, no suicidal or hemocidal ideation.  Labs on Admission: I have personally reviewed following labs and imaging studies  CBC:  Recent Labs Lab 11/22/15 0000  WBC 8.6  HGB 14.0  HCT 42.3  MCV 89.6  PLT 286   Basic Metabolic Panel:  Recent Labs Lab 11/22/15 0000  NA 137  K 3.8  CL 104  CO2 25  GLUCOSE 136*  BUN 8  CREATININE 1.01  CALCIUM 8.9   GFR: Estimated Creatinine Clearance: 90.9 mL/min (by C-G formula based on SCr of 1.01 mg/dL). Liver Function Tests: No results for input(s): AST, ALT, ALKPHOS, BILITOT, PROT, ALBUMIN in the last 168 hours. No results for input(s): LIPASE, AMYLASE in the last 168 hours. No results for input(s): AMMONIA in the last 168 hours. Coagulation Profile: No results for input(s): INR, PROTIME in the last 168 hours. Cardiac Enzymes: No results for input(s): CKTOTAL, CKMB, CKMBINDEX, TROPONINI in the last 168 hours. BNP (last 3 results) No results for input(s): PROBNP in the last 8760 hours. HbA1C: No results for input(s):  HGBA1C in the last 72 hours. CBG: No results for input(s): GLUCAP in the last 168 hours. Lipid Profile: No results for input(s): CHOL, HDL, LDLCALC, TRIG, CHOLHDL, LDLDIRECT in the last 72 hours. Thyroid Function Tests: No results for input(s): TSH, T4TOTAL, FREET4, T3FREE, THYROIDAB in the last 72 hours. Anemia Panel: No results for input(s): VITAMINB12, FOLATE, FERRITIN, TIBC, IRON, RETICCTPCT in the last 72 hours. Urine analysis:    Component Value Date/Time   COLORURINE YELLOW 11/23/2015 0130   APPEARANCEUR CLEAR 11/23/2015 0130   LABSPEC 1.015 11/23/2015 0130   PHURINE 7.0 11/23/2015 0130   GLUCOSEU NEGATIVE 11/23/2015 0130   HGBUR NEGATIVE 11/23/2015 0130   BILIRUBINUR NEGATIVE 11/23/2015 0130   KETONESUR NEGATIVE 11/23/2015 0130   PROTEINUR NEGATIVE 11/23/2015 0130   UROBILINOGEN 1.0 10/19/2013 1025   NITRITE NEGATIVE 11/23/2015 0130   LEUKOCYTESUR NEGATIVE 11/23/2015 0130   Sepsis Labs: @LABRCNTIP (procalcitonin:4,lacticidven:4) )No results found for this or any previous visit (from the past 240 hour(s)).   Radiological Exams on Admission: Dg Chest 2  View  Result Date: 11/23/2015 CLINICAL DATA:  Asthma and chest tightness EXAM: CHEST  2 VIEW COMPARISON:  06/28/2015 FINDINGS: The heart size and mediastinal contours are within normal limits. Hyperinflated lungs without acute pulmonary consolidation or CHF. The visualized skeletal structures are unremarkable. IMPRESSION: Hyperinflated appearance of the lungs consistent with history of asthma. Electronically Signed   By: Tollie Eth M.D.   On: 11/23/2015 01:08   Ct Angio Chest Pe W Or Wo Contrast  Result Date: 11/23/2015 CLINICAL DATA:  Panic attack.  Dyspnea.  Chest tightness. EXAM: CT ANGIOGRAPHY CHEST WITH CONTRAST TECHNIQUE: Multidetector CT imaging of the chest was performed using the standard protocol during bolus administration of intravenous contrast. Multiplanar CT image reconstructions and MIPs were obtained to  evaluate the vascular anatomy. CONTRAST:  100 mL IV Isovue 370 COMPARISON:  Same day chest radiograph and dating back through 02/22/2015 FINDINGS: Cardiovascular: Satisfactory opacification of the pulmonary arteries to the segmental level. No evidence of pulmonary embolism. Normal heart size. No pericardial effusion. Mediastinum/Nodes: No enlarged mediastinal, hilar, or axillary lymph nodes. Thyroid gland, trachea, and esophagus demonstrate no significant findings. Minimal adherent mucus the lateral wall of the trachea. Lungs/Pleura: Dependent atelectasis bilaterally without pneumonic consolidation, pneumothorax, or mass. Upper Abdomen: No acute abnormality. Musculoskeletal: Bilateral gynecomastia. Review of the MIP images confirms the above findings. IMPRESSION: No acute cardiopulmonary abnormality.  No pulmonary embolus. Electronically Signed   By: Tollie Eth M.D.   On: 11/23/2015 02:03     EKG: Independently reviewed.  Sinus rhythm, QTC 424, nonspecific T-wave changes in inferior leads.    Assessment/Plan Principal Problem:   Chest pain Active Problems:   Asthma   Tobacco abuse   Elevated lactic acid level   Cocaine abuse   Concern about STD in male without diagnosis   Chest pain: Most likely due to cocaine abuse. Initial troponin negative. EKG only showed nonspecific T-wave changes in inferior release. No PE by CTA.  - will place on Tele bed for obs - cycle CE q6 x3 and repeat her EKG in the am  - Nitroglycerin, aspirin - prn percocet for pain (avoid IV narcotics) - Risk factor stratification: will check FLP and A1C  - 2d echo  Asthma: stable. No wheezing or rhonchi on auscultation. -When necessary albuterol nebulizers for shortness of breath -Mucinex for cough  Tobacco abuse: -Did counseling about importance of quitting smoking -Nicotine patch  Cocaine abuse: -did counseling about importance of quitting cocaine  Elevated lactic acid level: Lactate 2.45, which has  normalized with IV fluids. Patient does not have signs of infection. No leukocytosis or fever. Chest x-ray has no infiltration. Negative urinalysis. Clinically patient does not have sepsis. Likely due to dehydration. -IV fluid: 2 L normal saline, followed by 1 25 mL per hour    Concern about STD in male without diagnosis: Patient reported penile discomfort and high risk of sexual behavior. -Did counseling about the importance of safe sex behavior -Patient received 1 dose of Rocephin and azithromycin in ED -Check GC chlamydia and HIV antibody  DVT ppx: SQ Lovenox Code Status: Full code Family Communication: None at bed side.  Disposition Plan:  Anticipate discharge back to previous home environment Consults called:  none Admission status: Obs / tele  Date of Service 11/23/2015    Lorretta Harp [facility] Pager [phone]  If 7PM-7AM, please contact night-coverage [email or web address] Password [email or web address] 11/23/2015, 4:40 AM

## 2015-11-23 NOTE — ED Triage Notes (Signed)
C/o pressure to center of chest since yesterday with sob.  States he was admitted to hospital yesterday and that his pain has continued.  Denies nausea and vomiting.

## 2015-11-23 NOTE — Consult Note (Signed)
Patient ID: Dustin Norris MRN: [id number], DOB/AGE: [id number]   Admit date: 11/22/2015   Primary Physician: No PCP Per Patient Primary Cardiologist: New   Reason for Consult: Chest Pain  Requesting MD: Dr. Susie Cassette, Internal Medicine   Pt. Profile:  40 y/o male admitted for chest pain. UDS + for cocaine.   Problem List  Past Medical History:  Diagnosis Date  . Anxiety   . Asthma   . Cocaine abuse   . Homelessness   . Migraine   . Tobacco abuse     History reviewed. No pertinent surgical history.   Allergies  No Known Allergies  HPI  40 y/o male admitted by Internal medicine for chest pain evaluation. UDS is + for cocaine. He is a smoker. He notes presence of SSCP off and on for several months. Described as a dull ache. Occurs at rest and sometimes worse with exertion. Earlier yesterday he had a severe episode of 10/10 CP at rest. Combination of dull and sharp pain. He is currently CP free but notes that he feels tired and weak. EKG shows NSR with slight diffuse ST segment elevations of < 1mm. POC troponin is negative. Lab troponins x 3 pending. CXR shows hyperinflated appearance of the lungs consistent with history of Asthma. Chest CT is negative for PE. No acute cardiopulmonary abnormality. VSS.    Home Medications  Prior to Admission medications   Medication Sig Start Date End Date Taking? Authorizing Provider  albuterol (PROVENTIL HFA;VENTOLIN HFA) 108 (90 Base) MCG/ACT inhaler Inhale 2 puffs into the lungs every 4 (four) hours as needed for wheezing or shortness of breath. 08/15/15  Yes Jerelyn Scott, MD  acetaminophen (TYLENOL) 325 MG tablet Take 1 tablet (325 mg total) by mouth every 6 (six) hours as needed. Patient not taking: Reported on 11/23/2015 08/05/15   Marlon Pel, PA-C  ibuprofen (ADVIL,MOTRIN) 800 MG tablet Take 1 tablet (800 mg total) by mouth 3 (three) times daily. Patient not taking: Reported on 11/23/2015 08/03/15   Eber Hong, MD  ondansetron  (ZOFRAN) 4 MG tablet Take 1 tablet (4 mg total) by mouth every 8 (eight) hours as needed for nausea or vomiting. Patient not taking: Reported on 11/23/2015 07/04/15   Marily Memos, MD  penicillin v potassium (VEETID) 500 MG tablet Take 1 tablet (500 mg total) by mouth 2 (two) times daily. Patient not taking: Reported on 11/23/2015 06/22/15   Lona Kettle, [facility] Meds  . aspirin  325 mg Oral Daily  . cefTRIAXone (ROCEPHIN) IM  250 mg Intramuscular Once  . dextromethorphan-guaiFENesin  1 tablet Oral BID  . enoxaparin (LOVENOX) injection  40 mg Subcutaneous Q24H  . nicotine  21 mg Transdermal Daily  . sterile water (preservative free)       Family History Negative Family History of CAD and Sudden Cardiac Death  Social History  Social History   Social History  . Marital status: Single    Spouse name: N/A  . Number of children: N/A  . Years of education: N/A   Occupational History  . Not on file.   Social History Main Topics  . Smoking status: Current Every Day Smoker    Packs/day: 0.00    Years: 0.00    Types: Cigarettes, Cigars  . Smokeless tobacco: Never Used  . Alcohol use No  . Drug use: No  . Sexual activity: Not on file   Other Topics Concern  . Not on file   Social History Narrative   **  Merged History Encounter **         Review of Systems General:  No chills, fever, night sweats or weight changes.  Cardiovascular:  No chest pain, dyspnea on exertion, edema, orthopnea, palpitations, paroxysmal nocturnal dyspnea. Dermatological: No rash, lesions/masses Respiratory: No cough, dyspnea Urologic: No hematuria, dysuria Abdominal:   No nausea, vomiting, diarrhea, bright red blood per rectum, melena, or hematemesis Neurologic:  No visual changes, wkns, changes in mental status. All other systems reviewed and are otherwise negative except as noted above.  Physical Exam  Blood pressure 125/80, pulse 69, temperature 98.6 F (37 C), temperature source  Oral, resp. rate 18, height 5\' 7"  (1.702 m), weight 142 lb 9.6 oz (64.7 kg), SpO2 100 %.  General: Pleasant, NAD Psych: Normal affect. Neuro: Alert and oriented X 3. Moves all extremities spontaneously. HEENT: Normal  Neck: Supple without bruits or JVD. Lungs:  Resp regular and unlabored, CTA. Heart: RRR no s3, s4, or murmurs. Abdomen: Soft, non-tender, non-distended, BS + x 4.  Extremities: No clubbing, cyanosis or edema. DP/PT/Radials 2+ and equal bilaterally.  Labs  Troponin [facility] of Care Test)  Recent Labs  11/23/15 0259  TROPIPOC 0.02    Recent Labs  11/23/15 0500  TROPONINI <0.03   Lab Results  Component Value Date   WBC 8.6 11/22/2015   HGB 14.0 11/22/2015   HCT 42.3 11/22/2015   MCV 89.6 11/22/2015   PLT 286 11/22/2015    Recent Labs Lab 11/22/15 0000  NA 137  K 3.8  CL 104  CO2 25  BUN 8  CREATININE 1.01  CALCIUM 8.9  GLUCOSE 136*   Lab Results  Component Value Date   CHOL 111 11/23/2015   HDL 61 11/23/2015   LDLCALC 46 11/23/2015   TRIG 20 11/23/2015   No results found for: DDIMER   Radiology/Studies  Dg Chest 2 View  Result Date: 11/23/2015 CLINICAL DATA:  Asthma and chest tightness EXAM: CHEST  2 VIEW COMPARISON:  06/28/2015 FINDINGS: The heart size and mediastinal contours are within normal limits. Hyperinflated lungs without acute pulmonary consolidation or CHF. The visualized skeletal structures are unremarkable. IMPRESSION: Hyperinflated appearance of the lungs consistent with history of asthma. Electronically Signed   By: Tollie Eth M.D.   On: 11/23/2015 01:08   Ct Angio Chest Pe W Or Wo Contrast  Result Date: 11/23/2015 CLINICAL DATA:  Panic attack.  Dyspnea.  Chest tightness. EXAM: CT ANGIOGRAPHY CHEST WITH CONTRAST TECHNIQUE: Multidetector CT imaging of the chest was performed using the standard protocol during bolus administration of intravenous contrast. Multiplanar CT image reconstructions and MIPs were obtained to evaluate the  vascular anatomy. CONTRAST:  100 mL IV Isovue 370 COMPARISON:  Same day chest radiograph and dating back through 02/22/2015 FINDINGS: Cardiovascular: Satisfactory opacification of the pulmonary arteries to the segmental level. No evidence of pulmonary embolism. Normal heart size. No pericardial effusion. Mediastinum/Nodes: No enlarged mediastinal, hilar, or axillary lymph nodes. Thyroid gland, trachea, and esophagus demonstrate no significant findings. Minimal adherent mucus the lateral wall of the trachea. Lungs/Pleura: Dependent atelectasis bilaterally without pneumonic consolidation, pneumothorax, or mass. Upper Abdomen: No acute abnormality. Musculoskeletal: Bilateral gynecomastia. Review of the MIP images confirms the above findings. IMPRESSION: No acute cardiopulmonary abnormality.  No pulmonary embolus. Electronically Signed   By: Tollie Eth M.D.   On: 11/23/2015 02:03    ECG  NSR. Slight ST segment elevation in leads I, II, V4-V6  , early repol   ASSESSMENT AND PLAN  1. Chest Pain with  Moderate Risk for Cardiac Etiology: mixed atypical and typical features. At moderate risk due to cocaine use. This was confirmed by UDS. Other than tobacco use, he has no other known risk factors. Cycle troponins x 3. Lexiscan NST if he rules out with negative enzymes. Avoid use of beta blockers. He will need counseling regarding avoidance of cocaine.    Signed, Robbie LisBrittainy Simmons, PA-C 11/23/2015, 8:48 AM   Attending Note:   The patient was seen and examined.  Agree with assessment and plan as noted above.  Changes made to the above note as needed.  Patient seen and independently examined with Brittainy simmons , PA .   We discussed all aspects of the encounter. I agree with the assessment and plan as stated above.  Pt presents with atypical chest pain. His urine drug screen is positive for cocaine. Exam as above.   See notes above. I do not think that he needs any further workup at this  time. Follow up with his general medical doctor  CP has resolved.  He has no further questions    I have spent a total of 40 minutes with patient reviewing hospital  notes , telemetry, EKGs, labs and examining patient as well as establishing an assessment and plan that was discussed with the patient. > 50% of time was spent in direct patient care.    Vesta MixerPhilip J. Terril Amaro, Montez HagemanJr., MD, [facility] 11/23/2015, 9:54 AM [address],  [address] Office [phone] Pager [phone]

## 2015-11-23 NOTE — ED Notes (Signed)
Pt aware that we still need urine specimen.

## 2015-11-23 NOTE — Discharge Summary (Signed)
Physician Discharge Summary  Bulmaro Feagans MRN: [id number] DOB/AGE: [birth year] 40 y.o.  PCP: No PCP Per Patient   Admit date: 11/22/2015 Discharge date: 11/23/2015  Discharge Diagnoses:    Principal Problem:   Chest pain Active Problems:   Asthma   Tobacco abuse   Elevated lactic acid level   Cocaine abuse   Concern about STD in male without diagnosis    Follow-up recommendations Follow-up with PCP in 3-5 days , including all  additional recommended appointments as below Follow-up CBC, CMP in 3-5 days          Discharge Medication List as of 11/23/2015 10:23 AM    START taking these medications   Details  nicotine (NICODERM CQ - DOSED IN MG/24 HOURS) 21 mg/24hr patch Place 1 patch (21 mg total) onto the skin daily., Starting Mon 11/24/2015, Normal      CONTINUE these medications which have NOT CHANGED   Details  albuterol (PROVENTIL HFA;VENTOLIN HFA) 108 (90 Base) MCG/ACT inhaler Inhale 2 puffs into the lungs every 4 (four) hours as needed for wheezing or shortness of breath., Starting Fri 08/15/2015, Print    acetaminophen (TYLENOL) 325 MG tablet Take 1 tablet (325 mg total) by mouth every 6 (six) hours as needed., Starting 08/05/2015, Until Discontinued, Print    ondansetron (ZOFRAN) 4 MG tablet Take 1 tablet (4 mg total) by mouth every 8 (eight) hours as needed for nausea or vomiting., Starting 07/04/2015, Until Discontinued, Print    penicillin v potassium (VEETID) 500 MG tablet Take 1 tablet (500 mg total) by mouth 2 (two) times daily., Starting 06/22/2015, Until Discontinued, Print      STOP taking these medications     ibuprofen (ADVIL,MOTRIN) 800 MG tablet          Discharge Condition: Stable, unless the patient resumes cocaine use  Discharge Instructions Get Medicines reviewed and adjusted: Please take all your medications with you for your next visit with your Primary MD  Please request your Primary MD to go over all hospital tests and  procedure/radiological results at the follow up, please ask your Primary MD to get all Hospital records sent to his/her office.  If you experience worsening of your admission symptoms, develop shortness of breath, life threatening emergency, suicidal or homicidal thoughts you must seek medical attention immediately by calling 911 or calling your MD immediately if symptoms less severe.  You must read complete instructions/literature along with all the possible adverse reactions/side effects for all the Medicines you take and that have been prescribed to you. Take any new Medicines after you have completely understood and accpet all the possible adverse reactions/side effects.   Do not drive when taking Pain medications.   Do not take more than prescribed Pain, Sleep and Anxiety Medications  Special Instructions: If you have smoked or chewed Tobacco in the last 2 yrs please stop smoking, stop any regular Alcohol and or any Recreational drug use.  Wear Seat belts while driving.  Please note  You were cared for by a hospitalist during your hospital stay. Once you are discharged, your primary care physician will handle any further medical issues. Please note that NO REFILLS for any discharge medications will be authorized once you are discharged, as it is imperative that you return to your primary care physician (or establish a relationship with a primary care physician if you do not have one) for your aftercare needs so that they can reassess your need for medications and monitor your lab values.  Discharge  Instructions    Diet - low sodium heart healthy    Complete by:  As directed    Increase activity slowly    Complete by:  As directed        No Known Allergies    Disposition: 01-Home or Self Care   Consults:  Cardiology*    Significant Diagnostic Studies:  Dg Chest 2 View  Result Date: 11/23/2015 CLINICAL DATA:  Asthma and chest tightness EXAM: CHEST  2 VIEW COMPARISON:   06/28/2015 FINDINGS: The heart size and mediastinal contours are within normal limits. Hyperinflated lungs without acute pulmonary consolidation or CHF. The visualized skeletal structures are unremarkable. IMPRESSION: Hyperinflated appearance of the lungs consistent with history of asthma. Electronically Signed   By: Ashley Royalty M.D.   On: 11/23/2015 01:08   Ct Angio Chest Pe W Or Wo Contrast  Result Date: 11/23/2015 CLINICAL DATA:  Panic attack.  Dyspnea.  Chest tightness. EXAM: CT ANGIOGRAPHY CHEST WITH CONTRAST TECHNIQUE: Multidetector CT imaging of the chest was performed using the standard protocol during bolus administration of intravenous contrast. Multiplanar CT image reconstructions and MIPs were obtained to evaluate the vascular anatomy. CONTRAST:  100 mL IV Isovue 370 COMPARISON:  Same day chest radiograph and dating back through 02/22/2015 FINDINGS: Cardiovascular: Satisfactory opacification of the pulmonary arteries to the segmental level. No evidence of pulmonary embolism. Normal heart size. No pericardial effusion. Mediastinum/Nodes: No enlarged mediastinal, hilar, or axillary lymph nodes. Thyroid gland, trachea, and esophagus demonstrate no significant findings. Minimal adherent mucus the lateral wall of the trachea. Lungs/Pleura: Dependent atelectasis bilaterally without pneumonic consolidation, pneumothorax, or mass. Upper Abdomen: No acute abnormality. Musculoskeletal: Bilateral gynecomastia. Review of the MIP images confirms the above findings. IMPRESSION: No acute cardiopulmonary abnormality.  No pulmonary embolus. Electronically Signed   By: Ashley Royalty M.D.   On: 11/23/2015 02:03        Filed Weights   11/22/15 2333 11/23/15 0521  Weight: 68 kg (150 lb) 64.7 kg (142 lb 9.6 oz)     Microbiology: No results found for this or any previous visit (from the past 240 hour(s)).     Blood Culture    Component Value Date/Time   SDES URINE, CLEAN CATCH 10/19/2013 1025    SPECREQUEST NONE 10/19/2013 1025   CULT  10/19/2013 1025    INSIGNIFICANT GROWTH Performed at [facility] 10/20/2013 FINAL 10/19/2013 1025      Labs: Results for orders placed or performed during the hospital encounter of 11/22/15 (from the past 48 hour(s))  Basic metabolic panel     Status: Abnormal   Collection Time: 11/22/15 12:00 AM  Result Value Ref Range   Sodium 137 135 - 145 mmol/L   Potassium 3.8 3.5 - 5.1 mmol/L   Chloride 104 101 - 111 mmol/L   CO2 25 22 - 32 mmol/L   Glucose, Bld 136 (H) 65 - 99 mg/dL   BUN 8 6 - 20 mg/dL   Creatinine, Ser 1.01 0.61 - 1.24 mg/dL   Calcium 8.9 8.9 - 10.3 mg/dL   GFR calc non Af Amer >60 >60 mL/min   GFR calc Af Amer >60 >60 mL/min    Comment: (NOTE) The eGFR has been calculated using the CKD EPI equation. This calculation has not been validated in all clinical situations. eGFR's persistently <60 mL/min signify possible Chronic Kidney Disease.    Anion gap 8 5 - 15  CBC     Status: None   Collection Time: 11/22/15 12:00  AM  Result Value Ref Range   WBC 8.6 4.0 - 10.5 K/uL   RBC 4.72 4.22 - 5.81 MIL/uL   Hemoglobin 14.0 13.0 - 17.0 g/dL   HCT 42.3 39.0 - 52.0 %   MCV 89.6 78.0 - 100.0 fL   MCH 29.7 26.0 - 34.0 pg   MCHC 33.1 30.0 - 36.0 g/dL   RDW 14.6 11.5 - 15.5 %   Platelets 286 150 - 400 K/uL  I-stat troponin, ED     Status: None   Collection Time: 11/23/15 12:17 AM  Result Value Ref Range   Troponin i, poc 0.01 0.00 - 0.08 ng/mL   Comment 3            Comment: Due to the release kinetics of cTnI, a negative result within the first hours of the onset of symptoms does not rule out myocardial infarction with certainty. If myocardial infarction is still suspected, repeat the test at appropriate intervals.   I-Stat CG4 Lactic Acid, ED     Status: Abnormal   Collection Time: 11/23/15 12:20 AM  Result Value Ref Range   Lactic Acid, Venous 2.45 (HH) 0.5 - 1.9 mmol/L   Comment NOTIFIED PHYSICIAN    Urinalysis, Routine w reflex microscopic (not at [facility])     Status: None   Collection Time: 11/23/15  1:30 AM  Result Value Ref Range   Color, Urine YELLOW YELLOW   APPearance CLEAR CLEAR   Specific Gravity, Urine 1.015 1.005 - 1.030   pH 7.0 5.0 - 8.0   Glucose, UA NEGATIVE NEGATIVE mg/dL   Hgb urine dipstick NEGATIVE NEGATIVE   Bilirubin Urine NEGATIVE NEGATIVE   Ketones, ur NEGATIVE NEGATIVE mg/dL   Protein, ur NEGATIVE NEGATIVE mg/dL   Nitrite NEGATIVE NEGATIVE   Leukocytes, UA NEGATIVE NEGATIVE    Comment: MICROSCOPIC NOT DONE ON URINES WITH NEGATIVE PROTEIN, BLOOD, LEUKOCYTES, NITRITE, OR GLUCOSE <1000 mg/dL.  Rapid urine drug screen (hospital performed)     Status: Abnormal   Collection Time: 11/23/15  1:30 AM  Result Value Ref Range   Opiates NONE DETECTED NONE DETECTED   Cocaine POSITIVE (A) NONE DETECTED   Benzodiazepines NONE DETECTED NONE DETECTED   Amphetamines NONE DETECTED NONE DETECTED   Tetrahydrocannabinol NONE DETECTED NONE DETECTED   Barbiturates NONE DETECTED NONE DETECTED    Comment:        DRUG SCREEN FOR MEDICAL PURPOSES ONLY.  IF CONFIRMATION IS NEEDED FOR ANY PURPOSE, NOTIFY LAB WITHIN 5 DAYS.        LOWEST DETECTABLE LIMITS FOR URINE DRUG SCREEN Drug Class       Cutoff (ng/mL) Amphetamine      1000 Barbiturate      200 Benzodiazepine   726 Tricyclics       203 Opiates          300 Cocaine          300 THC              50   I-stat troponin, ED     Status: None   Collection Time: 11/23/15  2:59 AM  Result Value Ref Range   Troponin i, poc 0.02 0.00 - 0.08 ng/mL   Comment 3            Comment: Due to the release kinetics of cTnI, a negative result within the first hours of the onset of symptoms does not rule out myocardial infarction with certainty. If myocardial infarction is still suspected, repeat the test at appropriate  intervals.   I-Stat CG4 Lactic Acid, ED     Status: None   Collection Time: 11/23/15  3:01 AM  Result Value Ref  Range   Lactic Acid, Venous 1.89 0.5 - 1.9 mmol/L  Ethanol     Status: None   Collection Time: 11/23/15  3:32 AM  Result Value Ref Range   Alcohol, Ethyl (B) <5 <5 mg/dL    Comment:        LOWEST DETECTABLE LIMIT FOR SERUM ALCOHOL IS 5 mg/dL FOR MEDICAL PURPOSES ONLY   Lipid panel     Status: None   Collection Time: 11/23/15  5:00 AM  Result Value Ref Range   Cholesterol 111 0 - 200 mg/dL   Triglycerides 20 <150 mg/dL   HDL 61 >40 mg/dL   Total CHOL/HDL Ratio 1.8 RATIO   VLDL 4 0 - 40 mg/dL   LDL Cholesterol 46 0 - 99 mg/dL    Comment:        Total Cholesterol/HDL:CHD Risk Coronary Heart Disease Risk Table                     Men   Women  1/2 Average Risk   3.4   3.3  Average Risk       5.0   4.4  2 X Average Risk   9.6   7.1  3 X Average Risk  23.4   11.0        Use the calculated Patient Ratio above and the CHD Risk Table to determine the patient's CHD Risk.        ATP III CLASSIFICATION (LDL):  <100     mg/dL   Optimal  100-129  mg/dL   Near or Above                    Optimal  130-159  mg/dL   Borderline  160-189  mg/dL   High  >190     mg/dL   Very High   Troponin I (q 6hr x 3)     Status: None   Collection Time: 11/23/15  5:00 AM  Result Value Ref Range   Troponin I <0.03 <0.03 ng/mL  Troponin I (q 6hr x 3)     Status: None   Collection Time: 11/23/15  8:59 AM  Result Value Ref Range   Troponin I <0.03 <0.03 ng/mL  Hepatic function panel     Status: Abnormal   Collection Time: 11/23/15  8:59 AM  Result Value Ref Range   Total Protein 5.9 (L) 6.5 - 8.1 g/dL   Albumin 3.6 3.5 - 5.0 g/dL   AST 21 15 - 41 U/L   ALT 16 (L) 17 - 63 U/L   Alkaline Phosphatase 49 38 - 126 U/L   Total Bilirubin 0.6 0.3 - 1.2 mg/dL   Bilirubin, Direct <0.1 (L) 0.1 - 0.5 mg/dL   Indirect Bilirubin NOT CALCULATED 0.3 - 0.9 mg/dL  Lipase, blood     Status: None   Collection Time: 11/23/15  8:59 AM  Result Value Ref Range   Lipase 37 11 - 51 U/L     Lipid Panel      Component Value Date/Time   CHOL 111 11/23/2015 0500   TRIG 20 11/23/2015 0500   HDL 61 11/23/2015 0500   CHOLHDL 1.8 11/23/2015 0500   VLDL 4 11/23/2015 0500   LDLCALC 46 11/23/2015 0500    HPI :  40 y.o.malewith medical history significant of asthma,  homeless, anxiety, migraine headaches, tobacco abuse, who presents with chest pain and penile discomfort.  Pt states that he started having chest pain before lunch today. His chest pain is located in the substernal area, sharp, 10 out of 10 in severity, nonradiating. It is not aggravated or alleviated by any known factors. It is associated with shortness of breath. No tenderness over calf areas. He has cough with yellow color sputum production. Per Triage report, pt had a three-way sexual encounter this morning, became nervous after he developed tingling around his urethra. EMS told RN that patient admitted to vaginal and anal sex with multiple partners this morning. Hissymptom has resolved currently. No Penile discharge. Patient denies nausea, vomiting, diarrhea, abdominal pain, symptoms of a UTI. No unilateral weakness.  ED Course:pt was found to have positive UDSfor cocaine, negative troponin, WBC 8.6, negative urinalysis, lactate 2.45-->1.89, renal function okay, temperature normal, tachycardia, tachypnea, Chest x-ray showed asthmatic change. CTA of chest isnegative for PE or infiltration  HOSPITAL COURSE  Chest pain:Most likely due to cocaine abuse. Initial troponin negative. EKG only showed nonspecific T-wave changes in inferior release. No PE by CTA. - Continue Tele bed for obs, normal sinus rhythm - cycle CE q6 x3 and repeat   EKG  remains unchanged - Nitroglycerin, aspirin - prn percocet for pain (avoid IV narcotics) - Risk factor stratification: Lipid panel shows LDL of 46, triglycerides 20 Patient evaluated by cardiology-chest pain deemed to be atypical in the setting of cocaine use No further recommendations, no further  workup indicated at this time as per cardiology  Asthma:stable. Nowheezing or rhonchi on auscultation. -When necessary albuterol nebulizers for shortness of breath -Mucinex for cough  Tobacco abuse: -Did counseling about importance of quitting smoking -Nicotine patch  Cocaine abuse: -did counseling about importance of quitting cocaine  Elevated lactic acid level:Lactate 2.45, which has normalized with IV fluids. Patient does not have signs of infection. No leukocytosis or fever. Chest x-ray has no infiltration. Negative urinalysis. Clinically patient does not have sepsis. Likely due to dehydration. Discontinue IV fluids, repeat lactate normal  Concern about STD in male without diagnosis: Patient reported penile discomfort and high risk of sexual behavior. -Did counseling about the importance of safe sex behavior -Patient received 1 dose of Rocephin and azithromycin in ED  GC chlamydia and HIV antibody -still pending   Discharge Exam:  Blood pressure 125/80, pulse 69, temperature 98.6 F (37 C), temperature source Oral, resp. rate 18, height '5\' 7"'  (1.702 m), weight 64.7 kg (142 lb 9.6 oz), SpO2 100 %.  General: Not in acute distress HEENT:       Eyes: PERRL, EOMI, no scleral icterus.       ENT: No discharge from the ears and nose, no pharynx injection, no tonsillar enlargement.        Neck: No JVD, no bruit, no mass felt. Heme: No neck lymph node enlargement. Cardiac: S1/S2, RRR, No murmurs, No gallops or rubs. Respiratory: No rales, wheezing, rhonchi or rubs. GI: Soft, nondistended, nontender, no rebound pain, no organomegaly, BS present. GU: No hematuria Ext: No pitting leg edema bilaterally. 2+DP/PT pulse bilaterally. Musculoskeletal: No joint deformities, No joint redness or warmth, no limitation of ROM in spin. Skin: No rashes.  Neuro: Alert, oriented X3, cranial nerves II-XII grossly intact, moves all extremities normally. Psych: Patient is not psychotic, no  suicidal or hemocidal ideation.    Follow-up Information    [facility] .   Specialty:  Radiology Contact information:  [address] [id number] [place]       pcp. Schedule an appointment as soon as possible for a visit in 2 day(s).   Why:  hospital follow-up          Signed: Ryott Rafferty 11/23/2015, 10:49 AM        Time spent >45 mins

## 2015-11-23 NOTE — Progress Notes (Signed)
Patient being discharged per MD order. All discharge instructions given to patient.

## 2015-11-23 NOTE — ED Notes (Signed)
Refuses to take rocephin injection.  Educated on importance of taking medication by myself and MusicianJessica RN.  Continues to refuse.  States "I'm good".

## 2015-11-24 ENCOUNTER — Emergency Department (HOSPITAL_COMMUNITY)
Admission: EM | Admit: 2015-11-24 | Discharge: 2015-11-24 | Disposition: A | Discharge status: Home / Self Care | Payer: Self-pay | Attending: Emergency Medicine | Admitting: Emergency Medicine

## 2015-11-24 DIAGNOSIS — R519 Headache, unspecified: Secondary | ICD-10-CM

## 2015-11-24 DIAGNOSIS — R0789 Other chest pain: Secondary | ICD-10-CM

## 2015-11-24 DIAGNOSIS — R51 Headache: Secondary | ICD-10-CM

## 2015-11-24 LAB — BASIC METABOLIC PANEL
ANION GAP: 7 (ref 5–15)
BUN: 8 mg/dL (ref 6–20)
CALCIUM: 9 mg/dL (ref 8.9–10.3)
CO2: 24 mmol/L (ref 22–32)
Chloride: 106 mmol/L (ref 101–111)
Creatinine, Ser: 0.86 mg/dL (ref 0.61–1.24)
GLUCOSE: 113 mg/dL — AB (ref 65–99)
Potassium: 3.5 mmol/L (ref 3.5–5.1)
Sodium: 137 mmol/L (ref 135–145)

## 2015-11-24 LAB — GC/CHLAMYDIA PROBE AMP (~~LOC~~) NOT AT ARMC
Chlamydia: NEGATIVE
Neisseria Gonorrhea: NEGATIVE

## 2015-11-24 LAB — HEMOGLOBIN A1C
Hgb A1c MFr Bld: 5.3 % (ref 4.8–5.6)
MEAN PLASMA GLUCOSE: 105 mg/dL

## 2015-11-24 MED ORDER — DEXAMETHASONE SODIUM PHOSPHATE 10 MG/ML IJ SOLN
10.0000 mg | Freq: Once | INTRAMUSCULAR | Status: AC
  Administered 2015-11-24: 10 mg via INTRAVENOUS
  Filled 2015-11-24: qty 1

## 2015-11-24 MED ORDER — IPRATROPIUM-ALBUTEROL 0.5-2.5 (3) MG/3ML IN SOLN
3.0000 mL | Freq: Four times a day (QID) | RESPIRATORY_TRACT | Status: DC | PRN
Start: 2015-11-24 — End: 2015-11-24

## 2015-11-24 MED ORDER — DIPHENHYDRAMINE HCL 50 MG/ML IJ SOLN
25.0000 mg | Freq: Once | INTRAMUSCULAR | Status: AC
  Administered 2015-11-24: 25 mg via INTRAVENOUS
  Filled 2015-11-24: qty 1

## 2015-11-24 MED ORDER — SODIUM CHLORIDE 0.9 % IV BOLUS (SEPSIS)
1000.0000 mL | Freq: Once | INTRAVENOUS | Status: AC
  Administered 2015-11-24: 1000 mL via INTRAVENOUS

## 2015-11-24 MED ORDER — IPRATROPIUM-ALBUTEROL 0.5-2.5 (3) MG/3ML IN SOLN
3.0000 mL | Freq: Once | RESPIRATORY_TRACT | Status: AC
  Administered 2015-11-24: 3 mL via RESPIRATORY_TRACT
  Filled 2015-11-24: qty 3

## 2015-11-24 MED ORDER — ACETAMINOPHEN 325 MG PO TABS
650.0000 mg | ORAL_TABLET | Freq: Once | ORAL | Status: AC
  Administered 2015-11-24: 650 mg via ORAL
  Filled 2015-11-24: qty 2

## 2015-11-24 MED ORDER — METOCLOPRAMIDE HCL 5 MG/ML IJ SOLN
10.0000 mg | Freq: Once | INTRAMUSCULAR | Status: AC
  Administered 2015-11-24: 10 mg via INTRAVENOUS
  Filled 2015-11-24: qty 2

## 2015-11-24 MED ORDER — KETOROLAC TROMETHAMINE 30 MG/ML IJ SOLN
30.0000 mg | Freq: Once | INTRAMUSCULAR | Status: AC
  Administered 2015-11-24: 30 mg via INTRAVENOUS
  Filled 2015-11-24: qty 1

## 2015-11-24 NOTE — ED Provider Notes (Signed)
[facility]-EMERGENCY DEPT Provider Note   CSN: [id number] Arrival date & time: 11/23/15  2310  By signing my name below, I, Dustin Norris, attest that this documentation has been prepared under the direction and in the presence of Dustin Boozeavid Delisia Mcquiston, MD. Electronically Signed: Doreatha MartinEva Norris, ED Scribe. 11/24/15. 1:42 AM.     History   Chief Complaint Chief Complaint  Patient presents with  . Chest Pain    HPI Dustin Norris is a 40 y.o. male with h/o cocaine abuse who presents to the Emergency Department complaining of moderate, persistent, sharp, substernal CP onset yesterday. Pt states his pain is worsened with ambulation and alleviated when lying down. He also complains of wheezing, mild SOB, frontal HA at this time. Pt was admitted to the hospital overnight 2 days ago for tx of the same CP, which was noted most likely due to cocaine abuse. He states he used "a little" cocaine since discharge yesterday. He states he has taken Tylenol with no relief of pain. Pt is a current smoker, 2-3 cigarettes per day. He denies visual disturbance, nausea, vomiting.   The history is provided by the patient. No language interpreter was used.    Past Medical History:  Diagnosis Date  . Anxiety   . Asthma   . Cocaine abuse   . Homelessness   . Migraine   . Tobacco abuse     Patient Active Problem List   Diagnosis Date Noted  . Chest pain 11/23/2015  . Asthma 11/23/2015  . Tobacco abuse 11/23/2015  . Elevated lactic acid level 11/23/2015  . Cocaine abuse   . Concern about STD in male without diagnosis   . Paranoid ideation (HCC) 02/22/2015    History reviewed. No pertinent surgical history.     Home Medications    Prior to Admission medications   Medication Sig Start Date End Date Taking? Authorizing Provider  acetaminophen (TYLENOL) 325 MG tablet Take 1 tablet (325 mg total) by mouth every 6 (six) hours as needed. Patient not taking: Reported on 11/23/2015 08/05/15   Dustin Peliffany Greene, PA-C    albuterol (PROVENTIL HFA;VENTOLIN HFA) 108 (90 Base) MCG/ACT inhaler Inhale 2 puffs into the lungs every 4 (four) hours as needed for wheezing or shortness of breath. 08/15/15   Dustin ScottMartha Linker, MD  nicotine (NICODERM CQ - DOSED IN MG/24 HOURS) 21 mg/24hr patch Place 1 patch (21 mg total) onto the skin daily. 11/24/15   Dustin OverlieNayana Abrol, MD  ondansetron (ZOFRAN) 4 MG tablet Take 1 tablet (4 mg total) by mouth every 8 (eight) hours as needed for nausea or vomiting. Patient not taking: Reported on 11/23/2015 07/04/15   Dustin MemosJason Mesner, MD  penicillin v potassium (VEETID) 500 MG tablet Take 1 tablet (500 mg total) by mouth 2 (two) times daily. Patient not taking: Reported on 11/23/2015 06/22/15   Dustin KettleAshley Laurel Meyer, PA-C    Family History No family history on file.  Social History Social History  Substance Use Topics  . Smoking status: Current Every Day Smoker    Packs/day: 0.00    Years: 0.00    Types: Cigarettes, Cigars  . Smokeless tobacco: Never Used  . Alcohol use No     Allergies   Review of patient's allergies indicates no known allergies.   Review of Systems Review of Systems  Eyes: Negative for visual disturbance.  Respiratory: Positive for shortness of breath and wheezing.   Cardiovascular: Positive for chest pain.  Gastrointestinal: Negative for nausea and vomiting.  Neurological: Positive for headaches.  All  other systems reviewed and are negative.    Physical Exam Updated Vital Signs BP 156/82   Pulse 93   Temp 98.9 F (37.2 C) (Oral)   Resp (!) 27   SpO2 100%   Physical Exam  Constitutional: He is oriented to person, place, and time. He appears well-developed and well-nourished.  HENT:  Head: Normocephalic and atraumatic.  No tenderness of temporalis muscles. Moderate tenderness at the insertion of paracervical muscles.   Eyes: EOM are normal. Pupils are equal, round, and reactive to light.  Fundi are normal.   Neck: Normal range of motion. Neck supple. No JVD  present.  Cardiovascular: Normal rate, regular rhythm and normal heart sounds.   No murmur heard. Pulmonary/Chest: Effort normal and breath sounds normal. He has no wheezes. He has no rales. He exhibits tenderness.  Tender over the lower sternal area, reproduces his pain.   Abdominal: Soft. Bowel sounds are normal. He exhibits no distension and no mass. There is no tenderness.  Musculoskeletal: Normal range of motion. He exhibits no edema.  Lymphadenopathy:    He has no cervical adenopathy.  Neurological: He is alert and oriented to person, place, and time. No cranial nerve deficit. He exhibits normal muscle tone. Coordination normal.  Skin: Skin is warm and dry. No rash noted.  Psychiatric: He has a normal mood and affect. His behavior is normal. Judgment and thought content normal.  Nursing note and vitals reviewed.    ED Treatments / Results   DIAGNOSTIC STUDIES: Oxygen Saturation is 99% on RA, normal by my interpretation.    COORDINATION OF CARE: 1:40 AM Discussed treatment plan with pt at bedside which includes lab work, CXR, pain relief and pt agreed to plan.    Labs (all labs ordered are listed, but only abnormal results are displayed) Labs Reviewed  BASIC METABOLIC PANEL - Abnormal; Notable for the following:       Result Value   Glucose, Bld 113 (*)    All other components within normal limits  CBC  I-STAT TROPOININ, ED    EKG  EKG Interpretation  Date/Time:  Sunday November 23 2015 23:21:37 EDT Ventricular Rate:  84 PR Interval:  140 QRS Duration: 82 QT Interval:  386 QTC Calculation: 456 R Axis:   93 Text Interpretation:  ** Poor data quality, interpretation may be adversely affected Normal sinus rhythm Rightward axis Moderate voltage criteria for LVH, may be normal variant Borderline ECG When compared with ECG of EARLIER SAME DATE No significant change was found Confirmed by [facility]  MD, Nilam Quakenbush ([id number]) on 11/23/2015 11:27:55 PM       Radiology Dg Chest 2  View  Result Date: 11/23/2015 CLINICAL DATA:  Asthma and chest tightness EXAM: CHEST  2 VIEW COMPARISON:  06/28/2015 FINDINGS: The heart size and mediastinal contours are within normal limits. Hyperinflated lungs without acute pulmonary consolidation or CHF. The visualized skeletal structures are unremarkable. IMPRESSION: Hyperinflated appearance of the lungs consistent with history of asthma. Electronically Signed   By: Tollie Eth M.D.   On: 11/23/2015 01:08   Ct Angio Chest Pe W Or Wo Contrast  Result Date: 11/23/2015 CLINICAL DATA:  Panic attack.  Dyspnea.  Chest tightness. EXAM: CT ANGIOGRAPHY CHEST WITH CONTRAST TECHNIQUE: Multidetector CT imaging of the chest was performed using the standard protocol during bolus administration of intravenous contrast. Multiplanar CT image reconstructions and MIPs were obtained to evaluate the vascular anatomy. CONTRAST:  100 mL IV Isovue 370 COMPARISON:  Same day chest radiograph and dating  back through 02/22/2015 FINDINGS: Cardiovascular: Satisfactory opacification of the pulmonary arteries to the segmental level. No evidence of pulmonary embolism. Normal heart size. No pericardial effusion. Mediastinum/Nodes: No enlarged mediastinal, hilar, or axillary lymph nodes. Thyroid gland, trachea, and esophagus demonstrate no significant findings. Minimal adherent mucus the lateral wall of the trachea. Lungs/Pleura: Dependent atelectasis bilaterally without pneumonic consolidation, pneumothorax, or mass. Upper Abdomen: No acute abnormality. Musculoskeletal: Bilateral gynecomastia. Review of the MIP images confirms the above findings. IMPRESSION: No acute cardiopulmonary abnormality.  No pulmonary embolus. Electronically Signed   By: Tollie Eth M.D.   On: 11/23/2015 02:03    Procedures Procedures (including critical care time)  Medications Ordered in ED Medications  sodium chloride 0.9 % bolus 1,000 mL (not administered)  metoCLOPramide (REGLAN) injection 10 mg  (not administered)  diphenhydrAMINE (BENADRYL) injection 25 mg (not administered)  dexamethasone (DECADRON) injection 10 mg (not administered)  ipratropium-albuterol (DUONEB) 0.5-2.5 (3) MG/3ML nebulizer solution 3 mL (not administered)  ketorolac (TORADOL) 30 MG/ML injection 30 mg (not administered)     Initial Impression / Assessment and Plan / ED Course  I have reviewed the triage vital signs and the nursing notes.  Pertinent labs & imaging results that were available during my care of the patient were reviewed by me and considered in my medical decision making (see chart for details).  Clinical Course   Chest pain which seems to be chest wall pain. Old records are reviewed, and he was just discharged from the hospital yesterday after being admitted for cocaine associated chest pain. Patient denies any additional cocaine use. Headache as findings suggestive of muscle contraction headache. He is given IV fluids, ketorolac, metoclopramide, diphenhydramine, dexamethasone with significant improvement in pain but he still had some residual chest discomfort. He is given acetaminophen.  Final Clinical Impressions(s) / ED Diagnoses   Final diagnoses:  Chest wall pain  Headache, unspecified headache type    New Prescriptions New Prescriptions   No medications on file    I personally performed the services described in this documentation, which was scribed in my presence. The recorded information has been reviewed and is accurate.       Dustin Booze, MD 11/24/15 [phone]

## 2015-11-24 NOTE — Discharge Instructions (Signed)
Take two naproxen tablets at a time, twice a day. Take acetaminophen as needed for pain not controlled by naproxen.  Do not use cocaine, or any other street drugs!

## 2015-11-24 NOTE — ED Notes (Signed)
Pt requested meds be given in pill form; Md notified

## 2016-03-21 ENCOUNTER — Emergency Department (HOSPITAL_COMMUNITY): Payer: Self-pay

## 2016-03-21 ENCOUNTER — Encounter (HOSPITAL_COMMUNITY): Payer: Self-pay | Admitting: Emergency Medicine

## 2016-03-21 ENCOUNTER — Emergency Department (HOSPITAL_COMMUNITY)
Admission: EM | Admit: 2016-03-21 | Discharge: 2016-03-21 | Disposition: A | Discharge status: Home / Self Care | Payer: Self-pay | Attending: Emergency Medicine | Admitting: Emergency Medicine

## 2016-03-21 DIAGNOSIS — J45909 Unspecified asthma, uncomplicated: Secondary | ICD-10-CM | POA: Insufficient documentation

## 2016-03-21 DIAGNOSIS — J069 Acute upper respiratory infection, unspecified: Secondary | ICD-10-CM | POA: Insufficient documentation

## 2016-03-21 DIAGNOSIS — Z79899 Other long term (current) drug therapy: Secondary | ICD-10-CM | POA: Insufficient documentation

## 2016-03-21 DIAGNOSIS — R0602 Shortness of breath: Secondary | ICD-10-CM | POA: Insufficient documentation

## 2016-03-21 DIAGNOSIS — F17210 Nicotine dependence, cigarettes, uncomplicated: Secondary | ICD-10-CM | POA: Insufficient documentation

## 2016-03-21 MED ORDER — ACETAMINOPHEN 500 MG PO TABS
1000.0000 mg | ORAL_TABLET | Freq: Once | ORAL | Status: AC
  Administered 2016-03-21: 1000 mg via ORAL
  Filled 2016-03-21: qty 2

## 2016-03-21 MED ORDER — ACETAMINOPHEN 500 MG PO TABS: 1000.0000 mg | ORAL_TABLET | Freq: Four times a day (QID) | ORAL | 0 refills | Status: DC | PRN

## 2016-03-21 MED ORDER — ALBUTEROL SULFATE HFA 108 (90 BASE) MCG/ACT IN AERS
2.0000 | INHALATION_SPRAY | Freq: Once | RESPIRATORY_TRACT | Status: AC
  Administered 2016-03-21: 2 via RESPIRATORY_TRACT
  Filled 2016-03-21: qty 6.7

## 2016-03-21 MED ORDER — ALBUTEROL SULFATE HFA 108 (90 BASE) MCG/ACT IN AERS: 1.0000 | INHALATION_SPRAY | Freq: Four times a day (QID) | RESPIRATORY_TRACT | 0 refills | Status: DC | PRN

## 2016-03-21 MED ORDER — IPRATROPIUM-ALBUTEROL 0.5-2.5 (3) MG/3ML IN SOLN
3.0000 mL | Freq: Once | RESPIRATORY_TRACT | Status: DC
Start: 2016-03-21 — End: 2016-03-21

## 2016-03-21 NOTE — ED Triage Notes (Addendum)
Pt BIB EMS from homeless shelter; c/o chest wall pain and SOB that began today; no respiratory distress noted; SpO2 100% on RA; no cough or N/V; pt denies recent cocaine use

## 2016-03-21 NOTE — ED Provider Notes (Signed)
[facility]-EMERGENCY DEPT Provider Note   CSN: [id number] Arrival date & time: 03/21/16  1930     History   Chief Complaint Chief Complaint  Patient presents with  . Shortness of Breath    HPI Dustin Norris is a 41 y.o. male.  HPI Patient reports he's had chest discomfort and headache since earlier today. He reports they both started at the same time. He reports he has had some cough with been nonproductive. He also endorses some nasal congestion and sneezing and scratchy throat. The patient has been living in a homeless shelter. He reports his only medication that he takes regularly is albuterol inhaler. Past Medical History:  Diagnosis Date  . Anxiety   . Asthma   . Cocaine abuse   . Homelessness   . Migraine   . Tobacco abuse     Patient Active Problem List   Diagnosis Date Noted  . Chest pain 11/23/2015  . Asthma 11/23/2015  . Tobacco abuse 11/23/2015  . Elevated lactic acid level 11/23/2015  . Cocaine abuse   . Concern about STD in male without diagnosis   . Paranoid ideation (HCC) 02/22/2015    History reviewed. No pertinent surgical history.     Home Medications    Prior to Admission medications   Medication Sig Start Date End Date Taking? Authorizing Provider  albuterol (PROVENTIL HFA;VENTOLIN HFA) 108 (90 Base) MCG/ACT inhaler Inhale 2 puffs into the lungs every 4 (four) hours as needed for wheezing or shortness of breath. Patient not taking: Reported on 03/22/2016 08/15/15  Yes Jerelyn Scott, MD  acetaminophen (TYLENOL) 325 MG tablet Take 2 tablets (650 mg total) by mouth every 6 (six) hours as needed. 03/23/16   Barrett Henle, PA-C  albuterol (PROVENTIL HFA;VENTOLIN HFA) 108 (90 Base) MCG/ACT inhaler Inhale 1-2 puffs into the lungs every 6 (six) hours as needed for wheezing or shortness of breath. 03/21/16   Arby Barrette, MD  ibuprofen (ADVIL,MOTRIN) 200 MG tablet Take 400 mg by mouth every 6 (six) hours as needed (migraines).    Historical  Provider, MD    Family History No family history on file.  Social History Social History  Substance Use Topics  . Smoking status: Current Every Day Smoker    Packs/day: 0.00    Years: 0.00    Types: Cigarettes, Cigars  . Smokeless tobacco: Never Used  . Alcohol use No     Allergies   Patient has no known allergies.   Review of Systems Review of Systems  10 Systems reviewed and are negative for acute change except as noted in the HPI.  Physical Exam Updated Vital Signs BP 134/85 (BP Location: Right Arm)   Pulse 85   Temp 99 F (37.2 C) (Oral)   Resp 18   SpO2 100%   Physical Exam  Constitutional: He appears well-developed and well-nourished.  HENT:  Head: Normocephalic and atraumatic.  Mouth/Throat: Oropharynx is clear and moist.  Eyes: Conjunctivae and EOM are normal.  Neck: Neck supple.  Cardiovascular: Normal rate and regular rhythm.   No murmur heard. Pulmonary/Chest: Effort normal and breath sounds normal. No respiratory distress.  Abdominal: Soft. There is no tenderness.  Musculoskeletal: Normal range of motion. He exhibits no edema.  Neurological: He is alert.  Skin: Skin is warm and dry.  Psychiatric: He has a normal mood and affect.  Nursing note and vitals reviewed.    ED Treatments / Results  Labs (all labs ordered are listed, but only abnormal results are displayed) Labs  Reviewed - No data to display  EKG  EKG Interpretation None       Radiology Dg Chest 2 View  Result Date: 03/27/2016 CLINICAL DATA:  Central chest pain EXAM: CHEST  2 VIEW COMPARISON:  03/21/2016 FINDINGS: The heart size and mediastinal contours are within normal limits. Both lungs are clear. The visualized skeletal structures are unremarkable. IMPRESSION: No active cardiopulmonary disease. Electronically Signed   By: Jasmine PangKim  Fujinaga M.D.   On: 03/27/2016 23:43    Procedures Procedures (including critical care time)  Medications Ordered in ED Medications    acetaminophen (TYLENOL) tablet 1,000 mg (1,000 mg Oral Given 03/21/16 2139)  albuterol (PROVENTIL HFA;VENTOLIN HFA) 108 (90 Base) MCG/ACT inhaler 2 puff (2 puffs Inhalation Given 03/21/16 2156)     Initial Impression / Assessment and Plan / ED Course  I have reviewed the triage vital signs and the nursing notes.  Pertinent labs & imaging results that were available during my care of the patient were reviewed by me and considered in my medical decision making (see chart for details).       Final Clinical Impressions(s) / ED Diagnoses   Final diagnoses:  SOB (shortness of breath)  Upper respiratory tract infection, unspecified type   Patient is clinically well in appearance. symptoms are consistent with URI. Patient will be given an inhaler to use on an as-needed basis. Advised to use acetaminophen for body ache and fever. New Prescriptions Discharge Medication List as of 03/21/2016 11:53 PM    START taking these medications   Details  !! albuterol (PROVENTIL HFA;VENTOLIN HFA) 108 (90 Base) MCG/ACT inhaler Inhale 1-2 puffs into the lungs every 6 (six) hours as needed for wheezing or shortness of breath., Starting Sun 03/21/2016, Print    acetaminophen (TYLENOL) 500 MG tablet Take 2 tablets (1,000 mg total) by mouth every 6 (six) hours as needed., Starting Sun 03/21/2016, Print     !! - Potential duplicate medications found. Please discuss with provider.       Arby BarretteMarcy Odesser Tourangeau, MD 03/28/16 [id number]

## 2016-03-22 ENCOUNTER — Encounter (HOSPITAL_COMMUNITY): Payer: Self-pay

## 2016-03-22 ENCOUNTER — Emergency Department (HOSPITAL_COMMUNITY)
Admission: EM | Admit: 2016-03-22 | Discharge: 2016-03-22 | Disposition: A | Discharge status: Home / Self Care | Payer: Self-pay | Attending: Emergency Medicine | Admitting: Emergency Medicine

## 2016-03-22 DIAGNOSIS — G43009 Migraine without aura, not intractable, without status migrainosus: Secondary | ICD-10-CM

## 2016-03-22 DIAGNOSIS — G43909 Migraine, unspecified, not intractable, without status migrainosus: Secondary | ICD-10-CM | POA: Insufficient documentation

## 2016-03-22 DIAGNOSIS — F17210 Nicotine dependence, cigarettes, uncomplicated: Secondary | ICD-10-CM | POA: Insufficient documentation

## 2016-03-22 DIAGNOSIS — J45909 Unspecified asthma, uncomplicated: Secondary | ICD-10-CM | POA: Insufficient documentation

## 2016-03-22 MED ORDER — ACETAMINOPHEN 500 MG PO TABS
1000.0000 mg | ORAL_TABLET | Freq: Once | ORAL | Status: AC
  Administered 2016-03-22: 1000 mg via ORAL
  Filled 2016-03-22: qty 2

## 2016-03-22 NOTE — ED Triage Notes (Signed)
Pt reports a headache that woke him up this morning. Endorses photosensitivity. Denies N/V/D. Denies neck pain. A&Ox4. Ambulatory.

## 2016-03-22 NOTE — ED Provider Notes (Signed)
[facility]-EMERGENCY DEPT Provider Note   CSN: [id number] Arrival date & time: 03/22/16  1730     History   Chief Complaint Chief Complaint  Patient presents with  . Headache    HPI Dustin Norris is a 41 y.o. male.  The history is provided by the patient.  Migraine  This is a recurrent problem. The current episode started 6 to 12 hours ago. The problem occurs constantly. The problem has not changed since onset.Pertinent negatives include no chest pain, no abdominal pain and no shortness of breath. Exacerbated by: light, sounds. Nothing relieves the symptoms. He has tried nothing for the symptoms. The treatment provided no relief.   Patient reports that usually takes Tylenol for his headache however has not had any today.   Past Medical History:  Diagnosis Date  . Anxiety   . Asthma   . Cocaine abuse   . Homelessness   . Migraine   . Tobacco abuse     Patient Active Problem List   Diagnosis Date Noted  . Chest pain 11/23/2015  . Asthma 11/23/2015  . Tobacco abuse 11/23/2015  . Elevated lactic acid level 11/23/2015  . Cocaine abuse   . Concern about STD in male without diagnosis   . Paranoid ideation (HCC) 02/22/2015    History reviewed. No pertinent surgical history.     Home Medications    Prior to Admission medications   Medication Sig Start Date End Date Taking? Authorizing Provider  albuterol (PROVENTIL HFA;VENTOLIN HFA) 108 (90 Base) MCG/ACT inhaler Inhale 1-2 puffs into the lungs every 6 (six) hours as needed for wheezing or shortness of breath. 03/21/16  Yes Arby BarretteMarcy Pfeiffer, MD  ibuprofen (ADVIL,MOTRIN) 200 MG tablet Take 400 mg by mouth every 6 (six) hours as needed (migraines).   Yes Historical Provider, MD  acetaminophen (TYLENOL) 500 MG tablet Take 2 tablets (1,000 mg total) by mouth every 6 (six) hours as needed. 03/21/16   Arby BarretteMarcy Pfeiffer, MD  albuterol (PROVENTIL HFA;VENTOLIN HFA) 108 (90 Base) MCG/ACT inhaler Inhale 2 puffs into the lungs every 4  (four) hours as needed for wheezing or shortness of breath. Patient not taking: Reported on 03/22/2016 08/15/15   Jerelyn ScottMartha Linker, MD    Family History History reviewed. No pertinent family history.  Social History Social History  Substance Use Topics  . Smoking status: Current Every Day Smoker    Packs/day: 0.00    Years: 0.00    Types: Cigarettes, Cigars  . Smokeless tobacco: Never Used  . Alcohol use No     Allergies   Patient has no known allergies.   Review of Systems Review of Systems  Constitutional: Negative for fever.  HENT: Negative for congestion and rhinorrhea.   Eyes: Positive for photophobia. Negative for visual disturbance.  Respiratory: Negative for cough and shortness of breath.   Cardiovascular: Negative for chest pain.  Gastrointestinal: Negative for abdominal pain, nausea and vomiting.  Neurological: Negative for dizziness, weakness and light-headedness.     Physical Exam Updated Vital Signs BP 151/100 (BP Location: Right Arm)   Pulse 85   Temp 99.2 F (37.3 C) (Oral)   Resp 12   Ht 5\' 7"  (1.702 m)   Wt 154 lb (69.9 kg)   SpO2 100%   BMI 24.12 kg/m   Physical Exam  Constitutional: He is oriented to person, place, and time. He appears well-developed and well-nourished. No distress.  HENT:  Head: Normocephalic and atraumatic.  Nose: Nose normal.  Eyes: Conjunctivae and EOM are normal. Pupils  are equal, round, and reactive to light. Right eye exhibits no discharge. Left eye exhibits no discharge. No scleral icterus.  Neck: Normal range of motion. Neck supple.  Cardiovascular: Normal rate and regular rhythm.  Exam reveals no gallop and no friction rub.   No murmur heard. Pulmonary/Chest: Effort normal and breath sounds normal. No stridor. No respiratory distress. He has no rales.  Abdominal: Soft. He exhibits no distension. There is no tenderness.  Musculoskeletal: He exhibits no edema or tenderness.  Neurological: He is alert and oriented to  person, place, and time.  Mental Status: Alert and oriented to person, place, and time. Attention and concentration normal. Speech clear. Recent memory is intac  Cranial Nerves  II Visual Fields: Intact to confrontation. Visual fields intact. III, IV, VI: Pupils equal and reactive to light and near. Full eye movement without nystagmus  V Facial Sensation: Normal. No weakness of masticatory muscles  VII: No facial weakness or asymmetry  VIII Auditory Acuity: Grossly normal  IX/X: The uvula is midline; the palate elevates symmetrically  XI: Normal sternocleidomastoid and trapezius strength  XII: The tongue is midline. No atrophy or fasciculations.   Motor System: Muscle Strength: 5/5 and symmetric in the upper and lower extremities. No pronation or drift.  Muscle Tone: Tone and muscle bulk are normal in the upper and lower extremities.   Reflexes: DTRs: 2+ and symmetrical in all four extremities. Plantar responses are flexor bilaterally.  Coordination: Intact finger-to-nose, heel-to-shin, and rapid alternating movements. No tremor.  Sensation: Intact to light touch, and pinprick. Negative Romberg test.  Gait: Routine and tandem gait are normal    Skin: Skin is warm and dry. No rash noted. He is not diaphoretic. No erythema.  Psychiatric: He has a normal mood and affect.  Vitals reviewed.    ED Treatments / Results  Labs (all labs ordered are listed, but only abnormal results are displayed) Labs Reviewed - No data to display  EKG  EKG Interpretation None       Radiology Dg Chest 2 View  Result Date: 03/21/2016 CLINICAL DATA:  Chest pain and shortness of breath EXAM: CHEST  2 VIEW COMPARISON:  11/23/2015. FINDINGS: The heart size and mediastinal contours are within normal limits. Both lungs are clear. The visualized skeletal structures are unremarkable. IMPRESSION: No active cardiopulmonary disease. Electronically Signed   By: Kennith Center M.D.   On: 03/21/2016 20:56     Procedures Procedures (including critical care time)  Medications Ordered in ED Medications  acetaminophen (TYLENOL) tablet 1,000 mg (1,000 mg Oral Given 03/22/16 1955)     Initial Impression / Assessment and Plan / ED Course  I have reviewed the triage vital signs and the nursing notes.  Pertinent labs & imaging results that were available during my care of the patient were reviewed by me and considered in my medical decision making (see chart for details).     Typical migraine headache for the pt. Non focal neuro exam. No recent head trauma. No fever. Doubt meningitis. Doubt intracranial bleed. Doubt IIH. Requesting only Tylenol for pain.  Provided for patient. Also given PO hydration.  The patient is safe for discharge with strict return precautions.    Final Clinical Impressions(s) / ED Diagnoses   Final diagnoses:  Migraine without aura and without status migrainosus, not intractable   Disposition: Discharge  Condition: Good  I have discussed the results, Dx and Tx plan with the patient who expressed understanding and agree(s) with the plan. Discharge instructions discussed  at great length. The patient was given strict return precautions who verbalized understanding of the instructions. No further questions at time of discharge.    New Prescriptions   No medications on file    Follow Up: primary care provider         Nira Conn, MD 03/22/16 2005

## 2016-03-22 NOTE — ED Notes (Signed)
Called x1 for triage. No answer

## 2016-03-22 NOTE — ED Notes (Signed)
Dr. Erlene QuanP. Cardama provided patient ginger ale.

## 2016-03-23 ENCOUNTER — Emergency Department (HOSPITAL_COMMUNITY)
Admission: EM | Admit: 2016-03-23 | Discharge: 2016-03-23 | Disposition: A | Discharge status: Home / Self Care | Payer: Self-pay | Attending: Emergency Medicine | Admitting: Emergency Medicine

## 2016-03-23 ENCOUNTER — Encounter (HOSPITAL_COMMUNITY): Payer: Self-pay | Admitting: Emergency Medicine

## 2016-03-23 DIAGNOSIS — G43809 Other migraine, not intractable, without status migrainosus: Secondary | ICD-10-CM | POA: Insufficient documentation

## 2016-03-23 DIAGNOSIS — J45909 Unspecified asthma, uncomplicated: Secondary | ICD-10-CM | POA: Insufficient documentation

## 2016-03-23 DIAGNOSIS — F17210 Nicotine dependence, cigarettes, uncomplicated: Secondary | ICD-10-CM | POA: Insufficient documentation

## 2016-03-23 MED ORDER — ACETAMINOPHEN 325 MG PO TABS: 650.0000 mg | ORAL_TABLET | Freq: Four times a day (QID) | ORAL | 0 refills | Status: DC | PRN

## 2016-03-23 MED ORDER — ACETAMINOPHEN 500 MG PO TABS
1000.0000 mg | ORAL_TABLET | Freq: Once | ORAL | Status: AC
  Administered 2016-03-23: 1000 mg via ORAL
  Filled 2016-03-23: qty 2

## 2016-03-23 NOTE — Discharge Instructions (Signed)
Take your medication as prescribed as needed for your headache. Continue drinking fluids at home to remain hydrated. Please follow up with a primary care provider from the Resource Guide provided below in 4-5 days as needed. Please return to the Emergency Department if symptoms worsen or new onset of fever, neck stiffness, visual changes, abdominal pain, vomiting, urinary symptoms, numbness, tingling, weakness, seizures, syncope.

## 2016-03-23 NOTE — ED Triage Notes (Signed)
Patient c/o headache since yesterday. patient states that light affects headache and having blurred vision.  Patient states that he was seen here yesterday for the same.

## 2016-03-23 NOTE — ED Provider Notes (Signed)
[facility]-EMERGENCY DEPT Provider Note   CSN: [id number] Arrival date & time: 03/23/16  1019     History   Chief Complaint Chief Complaint  Patient presents with  . Headache    HPI Dustin Norris is a 41 y.o. male.  HPI   Patient is a 41 year old male with history of migraines who presents to the ED with complaint of headache. Patient reports yesterday he began having a gradual onset of a frontal headache he describes as constant throbbing. Endorses associated photophobia and blurred vision. Patient reports his headache is consistent with his typical migraines. He notes he was seen in the ED yesterday for his migraine, given Tylenol with mild relief and was discharged home. Patient reports once he left he did not take any other medications at home for his migraines due to not having any Tylenol and reports that his migraine has continued. Patient reports his migraines are typically alleviated with Tylenol. Pt denies fever, neck stiffness, nasal congestion, rhinorrhea, sore throat, cough, abdominal pain, N/V, urinary symptoms, numbness, tingling, weakness, seizures, syncope.    Past Medical History:  Diagnosis Date  . Anxiety   . Asthma   . Cocaine abuse   . Homelessness   . Migraine   . Tobacco abuse     Patient Active Problem List   Diagnosis Date Noted  . Chest pain 11/23/2015  . Asthma 11/23/2015  . Tobacco abuse 11/23/2015  . Elevated lactic acid level 11/23/2015  . Cocaine abuse   . Concern about STD in male without diagnosis   . Paranoid ideation (HCC) 02/22/2015    History reviewed. No pertinent surgical history.     Home Medications    Prior to Admission medications   Medication Sig Start Date End Date Taking? Authorizing Provider  albuterol (PROVENTIL HFA;VENTOLIN HFA) 108 (90 Base) MCG/ACT inhaler Inhale 1-2 puffs into the lungs every 6 (six) hours as needed for wheezing or shortness of breath. 03/21/16  Yes Arby Barrette, MD  ibuprofen (ADVIL,MOTRIN) 200  MG tablet Take 400 mg by mouth every 6 (six) hours as needed (migraines).   Yes Historical Provider, MD  acetaminophen (TYLENOL) 325 MG tablet Take 2 tablets (650 mg total) by mouth every 6 (six) hours as needed. 03/23/16   Barrett Henle, PA-C  albuterol (PROVENTIL HFA;VENTOLIN HFA) 108 (90 Base) MCG/ACT inhaler Inhale 2 puffs into the lungs every 4 (four) hours as needed for wheezing or shortness of breath. Patient not taking: Reported on 03/22/2016 08/15/15   Jerelyn Scott, MD    Family History No family history on file.  Social History Social History  Substance Use Topics  . Smoking status: Current Every Day Smoker    Packs/day: 0.00    Years: 0.00    Types: Cigarettes, Cigars  . Smokeless tobacco: Never Used  . Alcohol use No     Allergies   Patient has no known allergies.   Review of Systems Review of Systems  Eyes: Positive for photophobia and visual disturbance (blurred).  Neurological: Positive for headaches.  All other systems reviewed and are negative.    Physical Exam Updated Vital Signs BP 151/94 (BP Location: Right Arm)   Pulse 72   Temp 98.1 F (36.7 C)   Resp 24   SpO2 100%   Physical Exam  Constitutional: He is oriented to person, place, and time. He appears well-developed and well-nourished. No distress.  HENT:  Head: Normocephalic and atraumatic.  Mouth/Throat: Oropharynx is clear and moist. No oropharyngeal exudate.  Eyes: Conjunctivae and  EOM are normal. Pupils are equal, round, and reactive to light. Right eye exhibits no discharge. Left eye exhibits no discharge. No scleral icterus.  Neck: Normal range of motion. Neck supple.  Cardiovascular: Normal rate, regular rhythm, normal heart sounds and intact distal pulses.   Pulmonary/Chest: Effort normal and breath sounds normal. No respiratory distress. He has no wheezes. He has no rales. He exhibits no tenderness.  Abdominal: Soft. Bowel sounds are normal. He exhibits no distension and no  mass. There is no tenderness. There is no rebound and no guarding. No hernia.  Musculoskeletal: Normal range of motion. He exhibits no edema.  Lymphadenopathy:    He has no cervical adenopathy.  Neurological: He is alert and oriented to person, place, and time. He has normal strength. No cranial nerve deficit or sensory deficit. Coordination normal.  Normal finger-nose-finger  Skin: Skin is warm and dry. He is not diaphoretic.  Nursing note and vitals reviewed.    ED Treatments / Results  Labs (all labs ordered are listed, but only abnormal results are displayed) Labs Reviewed - No data to display  EKG  EKG Interpretation None       Radiology Dg Chest 2 View  Result Date: 03/21/2016 CLINICAL DATA:  Chest pain and shortness of breath EXAM: CHEST  2 VIEW COMPARISON:  11/23/2015. FINDINGS: The heart size and mediastinal contours are within normal limits. Both lungs are clear. The visualized skeletal structures are unremarkable. IMPRESSION: No active cardiopulmonary disease. Electronically Signed   By: Kennith CenterEric  Mansell M.D.   On: 03/21/2016 20:56    Procedures Procedures (including critical care time)  Medications Ordered in ED Medications  acetaminophen (TYLENOL) tablet 1,000 mg (1,000 mg Oral Given 03/23/16 1410)     Initial Impression / Assessment and Plan / ED Course  I have reviewed the triage vital signs and the nursing notes.  Pertinent labs & imaging results that were available during my care of the patient were reviewed by me and considered in my medical decision making (see chart for details).     Patient presents with headache with associated photophobia and blurred vision which started yesterday. Reports headache is consistent with his typical migraines. Patient was initially seen in the ED yesterday, given Tylenol with mild improvement and then discharged home. Denies taking any medications since being seen in the ED but reports continued headache. VSS. Exam  unremarkable. No neuro deficits. Patient requesting Tylenol in the ED. I do not suspect SAH, ICH, intracranial lesion, meningitis, encephalopathy or encephalitis and do not think that cranial imaging is needed at this time.  On reevaluation patient reports improvement of headache. Patient discharged home with prescription of Tylenol and advised to follow up with PCP regarding his migraines. Discussed return precautions.  Final Clinical Impressions(s) / ED Diagnoses   Final diagnoses:  Other migraine without status migrainosus, not intractable    New Prescriptions New Prescriptions   ACETAMINOPHEN (TYLENOL) 325 MG TABLET    Take 2 tablets (650 mg total) by mouth every 6 (six) hours as needed.     Satira Sarkicole Elizabeth PickrellNadeau, [place] JerseyPA-C 03/23/16 1437    Laurence Spatesachel Morgan Little, MD 03/23/16 [phone]

## 2016-03-27 ENCOUNTER — Encounter (HOSPITAL_COMMUNITY): Payer: Self-pay | Admitting: Emergency Medicine

## 2016-03-27 ENCOUNTER — Emergency Department (HOSPITAL_COMMUNITY)
Admission: EM | Admit: 2016-03-27 | Discharge: 2016-03-27 | Disposition: A | Discharge status: Home / Self Care | Payer: Self-pay | Attending: Emergency Medicine | Admitting: Emergency Medicine

## 2016-03-27 ENCOUNTER — Emergency Department (HOSPITAL_COMMUNITY)
Admission: EM | Admit: 2016-03-27 | Discharge: 2016-03-27 | Discharge status: Against Medical Advice | Payer: Self-pay | Attending: Emergency Medicine | Admitting: Emergency Medicine

## 2016-03-27 ENCOUNTER — Emergency Department (HOSPITAL_COMMUNITY): Payer: Self-pay

## 2016-03-27 ENCOUNTER — Emergency Department (HOSPITAL_COMMUNITY)
Admission: EM | Admit: 2016-03-27 | Discharge: 2016-03-28 | Disposition: A | Discharge status: Home / Self Care | Payer: Self-pay | Attending: Emergency Medicine | Admitting: Emergency Medicine

## 2016-03-27 DIAGNOSIS — R0789 Other chest pain: Secondary | ICD-10-CM | POA: Insufficient documentation

## 2016-03-27 DIAGNOSIS — J45909 Unspecified asthma, uncomplicated: Secondary | ICD-10-CM | POA: Insufficient documentation

## 2016-03-27 DIAGNOSIS — F17210 Nicotine dependence, cigarettes, uncomplicated: Secondary | ICD-10-CM | POA: Insufficient documentation

## 2016-03-27 DIAGNOSIS — Z79899 Other long term (current) drug therapy: Secondary | ICD-10-CM | POA: Insufficient documentation

## 2016-03-27 DIAGNOSIS — R079 Chest pain, unspecified: Secondary | ICD-10-CM

## 2016-03-27 DIAGNOSIS — R51 Headache: Secondary | ICD-10-CM | POA: Insufficient documentation

## 2016-03-27 DIAGNOSIS — R519 Headache, unspecified: Secondary | ICD-10-CM

## 2016-03-27 LAB — BASIC METABOLIC PANEL
Anion gap: 7 (ref 5–15)
BUN: 8 mg/dL (ref 6–20)
CO2: 27 mmol/L (ref 22–32)
Calcium: 9.3 mg/dL (ref 8.9–10.3)
Chloride: 106 mmol/L (ref 101–111)
Creatinine, Ser: 1.13 mg/dL (ref 0.61–1.24)
GFR calc non Af Amer: >60 mL/min (ref >60)
Glucose, Bld: 121 mg/dL — ABNORMAL HIGH (ref 65–99)
POTASSIUM: 4 mmol/L (ref 3.5–5.1)
SODIUM: 140 mmol/L (ref 135–145)

## 2016-03-27 LAB — I-STAT TROPONIN, ED: Troponin i, poc: 0.01 ng/mL (ref 0.00–0.08)

## 2016-03-27 LAB — CBC
HEMATOCRIT: 39.7 % (ref 39.0–52.0)
HEMOGLOBIN: 13.2 g/dL (ref 13.0–17.0)
MCH: 29.4 pg (ref 26.0–34.0)
MCHC: 33.2 g/dL (ref 30.0–36.0)
MCV: 88.4 fL (ref 78.0–100.0)
Platelets: 270 10*3/uL (ref 150–400)
RBC: 4.49 MIL/uL (ref 4.22–5.81)
RDW: 14.5 % (ref 11.5–15.5)
WBC: 5 10*3/uL (ref 4.0–10.5)

## 2016-03-27 MED ORDER — ALBUTEROL SULFATE HFA 108 (90 BASE) MCG/ACT IN AERS
2.0000 | INHALATION_SPRAY | Freq: Once | RESPIRATORY_TRACT | Status: AC
  Administered 2016-03-27: 2 via RESPIRATORY_TRACT
  Filled 2016-03-27: qty 6.7

## 2016-03-27 MED ORDER — GI COCKTAIL ~~LOC~~: 30.0000 mL | Freq: Once | ORAL | Status: DC

## 2016-03-27 NOTE — ED Notes (Signed)
Pt asking for food  No fiood per dr Patria Manecampos

## 2016-03-27 NOTE — ED Notes (Signed)
Pt was sleeping in waiting area.  Security woke pt up and he states he needs to check-in.

## 2016-03-27 NOTE — ED Notes (Signed)
Pt told MD that he did not want to stay and receive treatment.

## 2016-03-27 NOTE — ED Triage Notes (Signed)
Pt BIB EMS. Pt is homeless and has had multiple recent visits to ED. Was already seen at [facility] today. He is alert, no acute distress. 12 Lead by EMS unremarkable.

## 2016-03-27 NOTE — ED Provider Notes (Signed)
[facility]-EMERGENCY DEPT Provider Note   CSN: [id number] Arrival date & time: 03/27/16  1813     History   Chief Complaint Chief Complaint  Patient presents with  . Chest Pain    HPI Dustin Norris is a 41 y.o. male.  HPI Patient presents emergency department complaining of waxing and waning chest pain over the past 24 hours.  No shortness of breath.  He reports some tightness in his chest.  No family history of early cardiac disease.  He states he has not used cocaine in several years but does have a positive urine drug screen for cocaine in October 2017.  No fevers or chills.  Denies productive cough.  Reports mild discomfort at this time.  Also reports mild headache at this time.  Patient was seen by myself last night for headache which is nonspecific and he was discharged home.  He is currently homeless.   Past Medical History:  Diagnosis Date  . Anxiety   . Asthma   . Cocaine abuse   . Homelessness   . Migraine   . Tobacco abuse     Patient Active Problem List   Diagnosis Date Noted  . Chest pain 11/23/2015  . Asthma 11/23/2015  . Tobacco abuse 11/23/2015  . Elevated lactic acid level 11/23/2015  . Cocaine abuse   . Concern about STD in male without diagnosis   . Paranoid ideation (HCC) 02/22/2015    History reviewed. No pertinent surgical history.     Home Medications    Prior to Admission medications   Medication Sig Start Date End Date Taking? Authorizing Provider  acetaminophen (TYLENOL) 325 MG tablet Take 2 tablets (650 mg total) by mouth every 6 (six) hours as needed. 03/23/16   Barrett Henle, PA-C  albuterol (PROVENTIL HFA;VENTOLIN HFA) 108 (90 Base) MCG/ACT inhaler Inhale 2 puffs into the lungs every 4 (four) hours as needed for wheezing or shortness of breath. Patient not taking: Reported on 03/22/2016 08/15/15   Jerelyn Scott, MD  albuterol (PROVENTIL HFA;VENTOLIN HFA) 108 (90 Base) MCG/ACT inhaler Inhale 1-2 puffs into the lungs every 6  (six) hours as needed for wheezing or shortness of breath. 03/21/16   Arby Barrette, MD  ibuprofen (ADVIL,MOTRIN) 200 MG tablet Take 400 mg by mouth every 6 (six) hours as needed (migraines).    Historical Provider, MD    Family History No family history on file.  Social History Social History  Substance Use Topics  . Smoking status: Current Every Day Smoker    Packs/day: 0.00    Years: 0.00    Types: Cigarettes, Cigars  . Smokeless tobacco: Never Used  . Alcohol use No     Allergies   Patient has no known allergies.   Review of Systems Review of Systems  All other systems reviewed and are negative.    Physical Exam Updated Vital Signs BP 145/90   Pulse 85   Temp 98.7 F (37.1 C)   Resp 21   Wt 154 lb (69.9 kg)   SpO2 100%   BMI 24.12 kg/m   Physical Exam  Constitutional: He is oriented to person, place, and time. He appears well-developed and well-nourished.  HENT:  Head: Normocephalic and atraumatic.  Eyes: EOM are normal.  Neck: Normal range of motion.  Cardiovascular: Normal rate, regular rhythm, normal heart sounds and intact distal pulses.   Pulmonary/Chest: Effort normal and breath sounds normal. No respiratory distress.  Abdominal: Soft. He exhibits no distension. There is no tenderness.  Musculoskeletal:  Normal range of motion. He exhibits no edema.  Neurological: He is alert and oriented to person, place, and time.  Skin: Skin is warm and dry.  Psychiatric: He has a normal mood and affect. Judgment normal.  Nursing note and vitals reviewed.    ED Treatments / Results  Labs (all labs ordered are listed, but only abnormal results are displayed) Labs Reviewed  CBC  BASIC METABOLIC PANEL  RAPID URINE DRUG SCREEN, HOSP PERFORMED  I-STAT TROPOININ, ED    EKG  EKG Interpretation  Date/Time:  Saturday March 27 2016 18:25:04 EST Ventricular Rate:  88 PR Interval:    QRS Duration: 77 QT Interval:  348 QTC Calculation: 421 R  Axis:   67 Text Interpretation:  Sinus rhythm No significant change was found Confirmed by Vinnie Bobst  MD, Garald Rhew ([id number]) on 03/27/2016 6:31:43 PM       Radiology No results found.  Procedures Procedures (including critical care time)  Medications Ordered in ED Medications  gi cocktail (Maalox,Lidocaine,Donnatal) (not administered)     Initial Impression / Assessment and Plan / ED Course  I have reviewed the triage vital signs and the nursing notes.  Pertinent labs & imaging results that were available during my care of the patient were reviewed by me and considered in my medical decision making (see chart for details).     EKG without ischemic changes.  Patient be worked up further for his chest pain  6:43 PM Patient this time refusing chest x-ray and laboratory studies.  I spoke with patient at length regarding the importance of workup.  Emergency department since he did call EMS for evaluation of his chest pain.  He states at this time he would prefer to go home and does not want any additional testing.  His EKG is without ischemic changes.  He understands the risk of being discharged home without ongoing workup of his active chest pain.  He states he would simply like to go back to [place] and will call his mother  Final Clinical Impressions(s) / ED Diagnoses   Final diagnoses:  Chest pain, unspecified type    New Prescriptions New Prescriptions   No medications on file     Azalia BilisKevin Cassia Fein, MD 03/27/16 1844

## 2016-03-27 NOTE — ED Notes (Signed)
Bed: WA01 Expected date:  Expected time:  Means of arrival:  Comments: [facility] 

## 2016-03-27 NOTE — ED Triage Notes (Signed)
Pt presents to ED for assessment of a headache.  States "I'm just here because I need some tylenol for a headache".  Patient eating chips at triage.  Denies any other associated symptoms.

## 2016-03-27 NOTE — ED Notes (Signed)
Pt now eating chips from vending machine.

## 2016-03-27 NOTE — ED Provider Notes (Signed)
[facility]-EMERGENCY DEPT Provider Note   CSN: [id number] Arrival date & time: 03/27/16  2218     History   Chief Complaint Chief Complaint  Patient presents with  . Chest Pain    HPI Dustin Norris is a 41 y.o. male.  HPI Patient presents emergency department complaining of waxing and waning chest pain over the past 24 hours.  No shortness of breath.  He reports some tightness in his chest.  No family history of early cardiac disease.  He states he has not used cocaine in several years but does have a positive urine drug screen for cocaine in October 2017.  No fevers or chills.  Denies productive cough.  Reports mild discomfort at this time.  Also reports mild headache at this time.  Patient was seen by myself last night for headache which is nonspecific and he was discharged home.  He is currently homeless.  Patient was seen in emergency department earlier today and left AMA.  He is to size check back in and agrees to blood work and x-ray at this time.    Past Medical History:  Diagnosis Date  . Anxiety   . Asthma   . Cocaine abuse   . Homelessness   . Migraine   . Tobacco abuse     Patient Active Problem List   Diagnosis Date Noted  . Chest pain 11/23/2015  . Asthma 11/23/2015  . Tobacco abuse 11/23/2015  . Elevated lactic acid level 11/23/2015  . Cocaine abuse   . Concern about STD in male without diagnosis   . Paranoid ideation (HCC) 02/22/2015    History reviewed. No pertinent surgical history.     Home Medications    Prior to Admission medications   Medication Sig Start Date End Date Taking? Authorizing Provider  acetaminophen (TYLENOL) 325 MG tablet Take 2 tablets (650 mg total) by mouth every 6 (six) hours as needed. 03/23/16   Barrett HenleNicole Elizabeth Nadeau, PA-C  albuterol (PROVENTIL HFA;VENTOLIN HFA) 108 (90 Base) MCG/ACT inhaler Inhale 2 puffs into the lungs every 4 (four) hours as needed for wheezing or shortness of breath. Patient not taking: Reported on  03/22/2016 08/15/15   Jerelyn ScottMartha Linker, MD  albuterol (PROVENTIL HFA;VENTOLIN HFA) 108 (90 Base) MCG/ACT inhaler Inhale 1-2 puffs into the lungs every 6 (six) hours as needed for wheezing or shortness of breath. 03/21/16   Arby BarretteMarcy Pfeiffer, MD  ibuprofen (ADVIL,MOTRIN) 200 MG tablet Take 400 mg by mouth every 6 (six) hours as needed (migraines).    Historical Provider, MD    Family History History reviewed. No pertinent family history.  Social History Social History  Substance Use Topics  . Smoking status: Current Every Day Smoker    Packs/day: 0.00    Years: 0.00    Types: Cigarettes, Cigars  . Smokeless tobacco: Never Used  . Alcohol use No     Allergies   Patient has no known allergies.   Review of Systems Review of Systems  All other systems reviewed and are negative.    Physical Exam Updated Vital Signs BP 151/100 (BP Location: Left Arm)   Pulse 82   Temp 98.6 F (37 C) (Oral)   Resp 18   Ht 5\' 7"  (1.702 m)   Wt 154 lb (69.9 kg)   SpO2 100%   BMI 24.12 kg/m   Physical Exam  Constitutional: He is oriented to person, place, and time. He appears well-developed and well-nourished.  HENT:  Head: Normocephalic and atraumatic.  Eyes: EOM are normal.  Neck: Normal range of motion.  Cardiovascular: Normal rate, regular rhythm, normal heart sounds and intact distal pulses.   Pulmonary/Chest: Effort normal and breath sounds normal. No respiratory distress.  Abdominal: Soft. He exhibits no distension. There is no tenderness.  Musculoskeletal: Normal range of motion.  Neurological: He is alert and oriented to person, place, and time.  Skin: Skin is warm and dry.  Psychiatric: He has a normal mood and affect. Judgment normal.  Nursing note and vitals reviewed.    ED Treatments / Results  Labs (all labs ordered are listed, but only abnormal results are displayed) Labs Reviewed  BASIC METABOLIC PANEL - Abnormal; Notable for the following:       Result Value   Glucose,  Bld 121 (*)    All other components within normal limits  CBC  I-STAT TROPOININ, ED    EKG  EKG Interpretation  Date/Time:  Saturday March 27 2016 22:45:53 EST Ventricular Rate:  74 PR Interval:    QRS Duration: 80 QT Interval:  372 QTC Calculation: 413 R Axis:   116 Text Interpretation:  normal sinus rhythm No significant change was found Confirmed by Karey Stucki  MD, Toretto Tingler ([id number]) on 03/27/2016 11:49:56 PM       Radiology Dg Chest 2 View  Result Date: 03/27/2016 CLINICAL DATA:  Central chest pain EXAM: CHEST  2 VIEW COMPARISON:  03/21/2016 FINDINGS: The heart size and mediastinal contours are within normal limits. Both lungs are clear. The visualized skeletal structures are unremarkable. IMPRESSION: No active cardiopulmonary disease. Electronically Signed   By: Jasmine Pang M.D.   On: 03/27/2016 23:43    Procedures Procedures (including critical care time)  Medications Ordered in ED Medications  albuterol (PROVENTIL HFA;VENTOLIN HFA) 108 (90 Base) MCG/ACT inhaler 2 puff (2 puffs Inhalation Given 03/27/16 2357)     Initial Impression / Assessment and Plan / ED Course  I have reviewed the triage vital signs and the nursing notes.  Pertinent labs & imaging results that were available during my care of the patient were reviewed by me and considered in my medical decision making (see chart for details).     Portable emergency is without significant abnormality.  Doubt ACS.  Doubt PE.  Patient be discharged home with an albuterol inhaler.  Given his history of cocaine abuse this could also represent coronary spasm.  Patient requests referral to cardiology for which he will be given the contact information.  Discharge home in good condition.  Final Clinical Impressions(s) / ED Diagnoses   Final diagnoses:  Chest pain, unspecified type    New Prescriptions New Prescriptions   No medications on file     Azalia Bilis, MD 03/27/16 2359

## 2016-03-27 NOTE — ED Provider Notes (Signed)
[facility]-EMERGENCY DEPT Provider Note   CSN: [id number] Arrival date & time: 03/27/16  0016     History   Chief Complaint Chief Complaint  Patient presents with  . Headache    HPI Dustin Norris is a 41 y.o. male.  HPI Pt now with his fourth visit this week to [facility] ER's for minor complaints. Reports HA today without injury. No visual change. No weakness of arms or legs. Currently homeless. Has no were to stay. Requesting something to eat at this time   Past Medical History:  Diagnosis Date  . Anxiety   . Asthma   . Cocaine abuse   . Homelessness   . Migraine   . Tobacco abuse     Patient Active Problem List   Diagnosis Date Noted  . Chest pain 11/23/2015  . Asthma 11/23/2015  . Tobacco abuse 11/23/2015  . Elevated lactic acid level 11/23/2015  . Cocaine abuse   . Concern about STD in male without diagnosis   . Paranoid ideation (HCC) 02/22/2015    History reviewed. No pertinent surgical history.     Home Medications    Prior to Admission medications   Medication Sig Start Date End Date Taking? Authorizing Provider  acetaminophen (TYLENOL) 325 MG tablet Take 2 tablets (650 mg total) by mouth every 6 (six) hours as needed. 03/23/16   Barrett Henle, PA-C  albuterol (PROVENTIL HFA;VENTOLIN HFA) 108 (90 Base) MCG/ACT inhaler Inhale 2 puffs into the lungs every 4 (four) hours as needed for wheezing or shortness of breath. Patient not taking: Reported on 03/22/2016 08/15/15   Jerelyn Scott, MD  albuterol (PROVENTIL HFA;VENTOLIN HFA) 108 (90 Base) MCG/ACT inhaler Inhale 1-2 puffs into the lungs every 6 (six) hours as needed for wheezing or shortness of breath. 03/21/16   Arby Barrette, MD  ibuprofen (ADVIL,MOTRIN) 200 MG tablet Take 400 mg by mouth every 6 (six) hours as needed (migraines).    Historical Provider, MD    Family History History reviewed. No pertinent family history.  Social History Social History  Substance Use Topics  . Smoking  status: Current Every Day Smoker    Packs/day: 0.00    Years: 0.00    Types: Cigarettes, Cigars  . Smokeless tobacco: Never Used  . Alcohol use No     Allergies   Patient has no known allergies.   Review of Systems Review of Systems  All other systems reviewed and are negative.    Physical Exam Updated Vital Signs BP 129/88 (BP Location: Left Arm)   Pulse 77   Temp 98.2 F (36.8 C) (Oral)   Resp 18   SpO2 100%   Physical Exam  Constitutional: He is oriented to person, place, and time. He appears well-developed and well-nourished.  HENT:  Head: Normocephalic and atraumatic.  Eyes: EOM are normal. Pupils are equal, round, and reactive to light.  Neck: Normal range of motion.  Cardiovascular: Normal rate, regular rhythm, normal heart sounds and intact distal pulses.   Pulmonary/Chest: Effort normal and breath sounds normal. No respiratory distress.  Abdominal: Soft. He exhibits no distension. There is no tenderness.  Musculoskeletal: Normal range of motion.  Neurological: He is alert and oriented to person, place, and time.  5/5 strength in major muscle groups of  bilateral upper and lower extremities. Speech normal. No facial asymetry.   Skin: Skin is warm and dry.  Psychiatric: He has a normal mood and affect. Judgment normal.  Nursing note and vitals reviewed.    ED Treatments /  Results  Labs (all labs ordered are listed, but only abnormal results are displayed) Labs Reviewed - No data to display  EKG  EKG Interpretation None       Radiology No results found.  Procedures Procedures (including critical care time)  Medications Ordered in ED Medications - No data to display   Initial Impression / Assessment and Plan / ED Course  I have reviewed the triage vital signs and the nursing notes.  Pertinent labs & imaging results that were available during my care of the patient were reviewed by me and considered in my medical decision making (see chart  for details).     Homeless. Normal neuro exam. Currently coming to ER for food and sleep. MSE complete. No lifethreatening illness  Final Clinical Impressions(s) / ED Diagnoses   Final diagnoses:  Nonintractable headache, unspecified chronicity pattern, unspecified headache type    New Prescriptions New Prescriptions   No medications on file     Azalia BilisKevin Filbert Craze, MD 03/27/16 0041

## 2016-03-31 ENCOUNTER — Emergency Department (HOSPITAL_COMMUNITY)
Admission: EM | Admit: 2016-03-31 | Discharge: 2016-03-31 | Disposition: A | Discharge status: Home / Self Care | Payer: Self-pay | Attending: Emergency Medicine | Admitting: Emergency Medicine

## 2016-03-31 ENCOUNTER — Encounter (HOSPITAL_COMMUNITY): Payer: Self-pay

## 2016-03-31 DIAGNOSIS — J45909 Unspecified asthma, uncomplicated: Secondary | ICD-10-CM | POA: Insufficient documentation

## 2016-03-31 DIAGNOSIS — G43909 Migraine, unspecified, not intractable, without status migrainosus: Secondary | ICD-10-CM | POA: Insufficient documentation

## 2016-03-31 DIAGNOSIS — F17210 Nicotine dependence, cigarettes, uncomplicated: Secondary | ICD-10-CM | POA: Insufficient documentation

## 2016-03-31 MED ORDER — DIPHENHYDRAMINE HCL 50 MG/ML IJ SOLN
25.0000 mg | Freq: Once | INTRAMUSCULAR | Status: AC
  Administered 2016-03-31: 25 mg via INTRAVENOUS
  Filled 2016-03-31: qty 1

## 2016-03-31 MED ORDER — KETOROLAC TROMETHAMINE 15 MG/ML IJ SOLN
15.0000 mg | Freq: Once | INTRAMUSCULAR | Status: AC
  Administered 2016-03-31: 15 mg via INTRAVENOUS
  Filled 2016-03-31: qty 1

## 2016-03-31 MED ORDER — SODIUM CHLORIDE 0.9 % IV SOLN
INTRAVENOUS | Status: DC
  Administered 2016-03-31: 20:00:00 via INTRAVENOUS

## 2016-03-31 MED ORDER — SODIUM CHLORIDE 0.9 % IV BOLUS (SEPSIS)
1000.0000 mL | Freq: Once | INTRAVENOUS | Status: AC
  Administered 2016-03-31: 1000 mL via INTRAVENOUS

## 2016-03-31 MED ORDER — PROMETHAZINE HCL 25 MG/ML IJ SOLN
12.5000 mg | Freq: Once | INTRAMUSCULAR | Status: AC
  Administered 2016-03-31: 12.5 mg via INTRAVENOUS
  Filled 2016-03-31 (×2): qty 1

## 2016-03-31 NOTE — ED Notes (Signed)
Patient requested this RN not give remaining phenergan. (half dose given. Med was diluted with 10 ml of ns) patient states fluids and meds are burning. Iv was assessed, no swelling, redness or warmth noted. Patient asked if the area was tender. Patient stated "its fine, I just don't want the rest of the medicine."

## 2016-03-31 NOTE — ED Provider Notes (Signed)
[facility]-EMERGENCY DEPT Provider Note   CSN: [id number] Arrival date & time: 03/31/16  1841     History   Chief Complaint Chief Complaint  Patient presents with  . Migraine    HPI Dustin Norris is a 41 y.o. male.  Patient seen frequently in the emergency department for complaint of migraine and chest pain. Patient states that onset of typical migraine for head area behind the eyes earlier today. Patient's migraines are usually improved with Tylenol. But he said he took Tylenol did not get any better. Associated with photophobia no nausea vomiting. No fevers no neck stiffness.       Past Medical History:  Diagnosis Date  . Anxiety   . Asthma   . Cocaine abuse   . Homelessness   . Migraine   . Tobacco abuse     Patient Active Problem List   Diagnosis Date Noted  . Chest pain 11/23/2015  . Asthma 11/23/2015  . Tobacco abuse 11/23/2015  . Elevated lactic acid level 11/23/2015  . Cocaine abuse   . Concern about STD in male without diagnosis   . Paranoid ideation (HCC) 02/22/2015    History reviewed. No pertinent surgical history.     Home Medications    Prior to Admission medications   Medication Sig Start Date End Date Taking? Authorizing Provider  acetaminophen (TYLENOL) 325 MG tablet Take 2 tablets (650 mg total) by mouth every 6 (six) hours as needed. 03/23/16  Yes Barrett Henle, PA-C  albuterol (PROVENTIL HFA;VENTOLIN HFA) 108 (90 Base) MCG/ACT inhaler Inhale 1-2 puffs into the lungs every 6 (six) hours as needed for wheezing or shortness of breath. 03/21/16  Yes Arby Barrette, MD  albuterol (PROVENTIL HFA;VENTOLIN HFA) 108 (90 Base) MCG/ACT inhaler Inhale 2 puffs into the lungs every 4 (four) hours as needed for wheezing or shortness of breath. Patient not taking: Reported on 03/22/2016 08/15/15   Jerelyn , MD  ibuprofen (ADVIL,MOTRIN) 200 MG tablet Take 400 mg by mouth every 6 (six) hours as needed (migraines).    Historical Provider, MD     Family History History reviewed. No pertinent family history.  Social History Social History  Substance Use Topics  . Smoking status: Current Every Day Smoker    Packs/day: 0.00    Years: 0.00    Types: Cigarettes, Cigars  . Smokeless tobacco: Never Used  . Alcohol use No     Allergies   Patient has no known allergies.   Review of Systems Review of Systems  Constitutional: Negative for fever.  HENT: Negative for congestion.   Eyes: Positive for photophobia.  Respiratory: Negative for shortness of breath.   Cardiovascular: Negative for chest pain.  Gastrointestinal: Negative for abdominal pain, nausea and vomiting.  Genitourinary: Negative for dysuria.  Musculoskeletal: Negative for neck pain and neck stiffness.  Skin: Negative for rash.  Neurological: Positive for headaches.  Hematological: Does not bruise/bleed easily.  Psychiatric/Behavioral: Negative for confusion.     Physical Exam Updated Vital Signs BP 138/83 (BP Location: Left Arm)   Pulse 68   Temp 98.1 F (36.7 C) (Oral)   Resp 20   Ht 5\' 7"  (1.702 m)   Wt 69.9 kg   SpO2 97%   BMI 24.12 kg/m   Physical Exam  Constitutional: He is oriented to person, place, and time. He appears well-developed and well-nourished. No distress.  HENT:  Head: Normocephalic and atraumatic.  Mouth/Throat: Oropharynx is clear and moist.  Eyes: Conjunctivae and EOM are normal. Pupils are equal,  round, and reactive to light.  Neck: Normal range of motion. Neck supple.  Cardiovascular: Normal rate and regular rhythm.   Pulmonary/Chest: Effort normal and breath sounds normal. No respiratory distress.  Abdominal: Soft. Bowel sounds are normal. There is no tenderness.  Musculoskeletal: Normal range of motion.  Neurological: He is alert and oriented to person, place, and time. No cranial nerve deficit or sensory deficit. He exhibits normal muscle tone. Coordination normal.  Skin: Skin is warm.  Nursing note and vitals  reviewed.    ED Treatments / Results  Labs (all labs ordered are listed, but only abnormal results are displayed) Labs Reviewed - No data to display  EKG  EKG Interpretation None       Radiology No results found.  Procedures Procedures (including critical care time)  Medications Ordered in ED Medications  0.9 %  sodium chloride infusion ( Intravenous New Bag/Given 03/31/16 2021)  sodium chloride 0.9 % bolus 1,000 mL (1,000 mLs Intravenous New Bag/Given 03/31/16 2021)  ketorolac (TORADOL) 15 MG/ML injection 15 mg (15 mg Intravenous Given 03/31/16 2018)  promethazine (PHENERGAN) injection 12.5 mg (12.5 mg Intravenous Given 03/31/16 1200)  diphenhydrAMINE (BENADRYL) injection 25 mg (25 mg Intravenous Given 03/31/16 2018)     Initial Impression / Assessment and Plan / ED Course  I have reviewed the triage vital signs and the nursing notes.  Pertinent labs & imaging results that were available during my care of the patient were reviewed by me and considered in my medical decision making (see chart for details).    Patient seen frequently for complaint of headache deemed to be migraines for him. Usually improves with Tylenol. This time patient took Tylenol prior to arrival without significant improvement. Patient given some IV fluids as well as Phenergan and Toradol with significant improvement in the headache.  Patient without any neuro focal deficits on presentation other than some mild photophobia.   Final Clinical Impressions(s) / ED Diagnoses   Final diagnoses:  Migraine without status migrainosus, not intractable, unspecified migraine type    New Prescriptions New Prescriptions   No medications on file     Vanetta MuldersScott Danner Paulding, MD 03/31/16 2305

## 2016-03-31 NOTE — Discharge Instructions (Signed)
Return for any new or worse symptoms. °

## 2016-03-31 NOTE — ED Triage Notes (Signed)
Patient c/o migraine headache today. Patient states he took Tylenol today at 1400 with no relief. Patient c/o blurred vision and sensitivity to light. Patient reports that he has been having frequent headaches. Patient denies nausea or vomiting.

## 2016-04-01 ENCOUNTER — Emergency Department (HOSPITAL_COMMUNITY)
Admission: EM | Admit: 2016-04-01 | Discharge: 2016-04-01 | Disposition: A | Discharge status: Home / Self Care | Payer: Self-pay | Attending: Emergency Medicine | Admitting: Emergency Medicine

## 2016-04-01 ENCOUNTER — Encounter (HOSPITAL_COMMUNITY): Payer: Self-pay

## 2016-04-01 ENCOUNTER — Encounter (HOSPITAL_COMMUNITY): Payer: Self-pay | Admitting: Emergency Medicine

## 2016-04-01 ENCOUNTER — Emergency Department (HOSPITAL_COMMUNITY): Payer: Self-pay

## 2016-04-01 DIAGNOSIS — Z5321 Procedure and treatment not carried out due to patient leaving prior to being seen by health care provider: Secondary | ICD-10-CM | POA: Insufficient documentation

## 2016-04-01 DIAGNOSIS — R0602 Shortness of breath: Secondary | ICD-10-CM | POA: Insufficient documentation

## 2016-04-01 DIAGNOSIS — R079 Chest pain, unspecified: Secondary | ICD-10-CM | POA: Insufficient documentation

## 2016-04-01 DIAGNOSIS — J45909 Unspecified asthma, uncomplicated: Secondary | ICD-10-CM | POA: Insufficient documentation

## 2016-04-01 DIAGNOSIS — Z79899 Other long term (current) drug therapy: Secondary | ICD-10-CM | POA: Insufficient documentation

## 2016-04-01 DIAGNOSIS — F17210 Nicotine dependence, cigarettes, uncomplicated: Secondary | ICD-10-CM | POA: Insufficient documentation

## 2016-04-01 DIAGNOSIS — R0789 Other chest pain: Secondary | ICD-10-CM | POA: Insufficient documentation

## 2016-04-01 NOTE — ED Notes (Signed)
Pt outside smoking, x ray unable to take pt for chest xray

## 2016-04-01 NOTE — ED Triage Notes (Signed)
Weapons assessment preformed. Pt denied any weapons however what appears to be a knife noted in the right pocket. Noted by tech as well. Pt no longer present in the lobby at this time, but will have security paged if he is seen again.

## 2016-04-01 NOTE — ED Triage Notes (Signed)
[facility]- pt coming from [employer] with c/o shortness of breath and chest pain that began today at 0800. Lung sounds clear. 98% SPO2 on room air. 324mg  of ASA prior to arrival. Pt refused IV start. Vitals stable.

## 2016-04-01 NOTE — ED Notes (Signed)
Pt called several times without response 

## 2016-04-01 NOTE — ED Triage Notes (Signed)
Per EMS, [facility]  Pt was seen there but decided not to stay due to wait times.  Pt c/o SOB.  Hx asthma.  No treatment in route.  Lung sounds clear.  Symptoms started at 8 am.  Chest discomfort with deep breath.  Does not have his inhaler.  Doesn't take his meds.  Vitals:  156/100, hr 90, 97% ra, cbg 113

## 2016-04-01 NOTE — ED Notes (Signed)
Pt refused to have lab work drawn 

## 2016-04-01 NOTE — ED Notes (Signed)
PATIENT DID NOT ANSWER WHEN CALLED FOR BLOOD COLLECTIONS

## 2016-04-01 NOTE — ED Notes (Signed)
Pt called for v/s recheck, no response from lobby 

## 2016-04-02 ENCOUNTER — Emergency Department (HOSPITAL_COMMUNITY)
Admission: EM | Admit: 2016-04-02 | Discharge: 2016-04-02 | Disposition: A | Discharge status: Home / Self Care | Payer: Self-pay | Attending: Emergency Medicine | Admitting: Emergency Medicine

## 2016-04-02 ENCOUNTER — Encounter (HOSPITAL_COMMUNITY): Payer: Self-pay | Admitting: Emergency Medicine

## 2016-04-02 DIAGNOSIS — R519 Headache, unspecified: Secondary | ICD-10-CM

## 2016-04-02 DIAGNOSIS — F17210 Nicotine dependence, cigarettes, uncomplicated: Secondary | ICD-10-CM | POA: Insufficient documentation

## 2016-04-02 DIAGNOSIS — R51 Headache: Secondary | ICD-10-CM | POA: Insufficient documentation

## 2016-04-02 DIAGNOSIS — J45909 Unspecified asthma, uncomplicated: Secondary | ICD-10-CM | POA: Insufficient documentation

## 2016-04-02 MED ORDER — BUTALBITAL-APAP-CAFFEINE 50-325-40 MG PO TABS
1.0000 | ORAL_TABLET | Freq: Once | ORAL | Status: AC
  Administered 2016-04-02: 1 via ORAL
  Filled 2016-04-02: qty 1

## 2016-04-02 MED ORDER — BUTALBITAL-APAP-CAFFEINE 50-325-40 MG PO TABS: 1.0000 | ORAL_TABLET | Freq: Four times a day (QID) | ORAL | 0 refills | Status: DC | PRN

## 2016-04-02 NOTE — ED Triage Notes (Signed)
Pt c/o migraine that began this am at 10; describes pain as throbbing; seen yesterday for same; pt got shot yesterday that improved headache; denies numbness weakness, photosensitivity, and other neuro symptoms

## 2016-04-02 NOTE — ED Provider Notes (Signed)
[facility]-EMERG[facility]Y DEPT Provider Note   CSN: [id number] Arrival date & time: 04/02/16  2033 By signing my name below, I, Levon Hedger, attest that this documentation has been prepared under the direction and in the presence of non-physician practitioner, Fayrene Helper, PA-C. Electronically Signed: Levon Hedger, Scribe. 04/02/2016. 10:03 PM.   History   Chief Complaint Chief Complaint  Patient presents with  . Migraine   HPI Dustin Norris is a 41 y.o. male with a PMHx of migraine who presents to the Emergency Department complaining of gradual onset, gradually worsening frontal headache onset this morning. Pt describes this as constant, 10/10, throbbing pain. Pt notes associated photophobia. He has taken OTC medication today with no relief. No alleviating factors noted. He was seen for the same yesterday where he was given Phenergan and Toradol with significant improvement. Pt denies any regular caffeine intake. He denies any nausea, vomiting, numbness, or weakness. Pt has no other complaints or symptoms at this time.  The history is provided by the patient. No language interpreter was used.   Past Medical History:  Diagnosis Date  . Anxiety   . Asthma   . Cocaine abuse   . Homelessness   . Migraine   . Tobacco abuse    Patient Active Problem List   Diagnosis Date Noted  . Chest pain 11/23/2015  . Asthma 11/23/2015  . Tobacco abuse 11/23/2015  . Elevated lactic acid level 11/23/2015  . Cocaine abuse   . Concern about STD in male without diagnosis   . Paranoid ideation (HCC) 02/22/2015    History reviewed. No pertinent surgical history.    Home Medications    Prior to Admission medications   Medication Sig Start Date End Date Taking? Authorizing Provider  acetaminophen (TYLENOL) 325 MG tablet Take 2 tablets (650 mg total) by mouth every 6 (six) hours as needed. 03/23/16   Barrett Henle, PA-C  albuterol (PROVENTIL HFA;VENTOLIN HFA) 108 (90 Base) MCG/ACT inhaler  Inhale 2 puffs into the lungs every 4 (four) hours as needed for wheezing or shortness of breath. Patient not taking: Reported on 03/22/2016 08/15/15   Jerelyn Scott, MD  albuterol (PROVENTIL HFA;VENTOLIN HFA) 108 (90 Base) MCG/ACT inhaler Inhale 1-2 puffs into the lungs every 6 (six) hours as needed for wheezing or shortness of breath. 03/21/16   Arby Barrette, MD  ibuprofen (ADVIL,MOTRIN) 200 MG tablet Take 400 mg by mouth every 6 (six) hours as needed (migraines).    Historical Provider, MD    Family History No family history on file.  Social History Social History  Substance Use Topics  . Smoking status: Current Every Day Smoker    Packs/day: 1.00    Years: 0.00    Types: Cigarettes, Cigars  . Smokeless tobacco: Never Used  . Alcohol use No    Allergies   Patient has no known allergies.  Review of Systems Review of Systems  Eyes: Positive for photophobia.  Gastrointestinal: Negative for nausea and vomiting.  Neurological: Positive for headaches. Negative for weakness and numbness.   Physical Exam Updated Vital Signs BP 146/88 (BP Location: Left Arm)   Pulse 87   Temp 98.8 F (37.1 C) (Oral)   Resp 18   Ht 5\' 7"  (1.702 m)   Wt 133 lb 12.8 oz (60.7 kg)   SpO2 100%   BMI 20.96 kg/m   Physical Exam  Constitutional: He is oriented to person, place, and time. He appears well-developed and well-nourished. No distress.  HENT:  Head: Normocephalic and atraumatic.  No nuchal rigidity.   Eyes: Conjunctivae and EOM are normal. Pupils are equal, round, and reactive to light.  Cardiovascular: Normal rate.   Pulmonary/Chest: Effort normal.  Abdominal: He exhibits no distension.  Neurological: He is alert and oriented to person, place, and time.  CN 3-12 grossly intact. Normal finger to nose and heel to shin coordination.   Skin: Skin is warm and dry.  Psychiatric: He has a normal mood and affect.  Nursing note and vitals reviewed.  ED Treatments / Results  DIAGNOSTIC  STUDIES:  Oxygen Saturation is 100% on RA, normal by my interpretation.    COORDINATION OF CARE:  9:49 PM Discussed treatment plan with pt at bedside and pt agreed to plan.   Labs (all labs ordered are listed, but only abnormal results are displayed) Labs Reviewed - No data to display  EKG  EKG Interpretation None      Radiology Dg Chest 2 View  Result Date: 04/01/2016 CLINICAL DATA:  Chest pain and shortness of breath. EXAM: CHEST  2 VIEW COMPARISON:  03/27/2016 chest radiograph FINDINGS: Stable heart size and mediastinal contours are within normal limits. Both lungs are clear. Mild thoracic levocurvature. IMPRESSION: No active cardiopulmonary disease. Electronically Signed   By: Mitzi HansenLance  Furusawa-Stratton M.D.   On: 04/01/2016 20:12    Procedures Procedures (including critical care time)  Medications Ordered in ED Medications  butalbital-acetaminophen-caffeine (FIORICET, ESGIC) 50-325-40 MG per tablet 1 tablet (not administered)     Initial Impression / Assessment and Plan / ED Course  I have reviewed the triage vital signs and the nursing notes.  Pertinent labs & imaging results that were available during my care of the patient were reviewed by me and considered in my medical decision making (see chart for details).     BP 146/88 (BP Location: Left Arm)   Pulse 87   Temp 98.8 F (37.1 C) (Oral)   Resp 18   Ht 5\' 7"  (1.702 m)   Wt 60.7 kg   SpO2 100%   BMI 20.96 kg/m    Final Clinical Impressions(s) / ED Diagnoses   Final diagnoses:  Bad headache    New Prescriptions New Prescriptions   BUTALBITAL-ACETAMINOPHEN-CAFFEINE (FIORICET, ESGIC) 50-325-40 MG TABLET    Take 1-2 tablets by mouth every 6 (six) hours as needed for headache.   I personally performed the services described in this documentation, which was scribed in my presence. The recorded information has been reviewed and is accurate.   Headache similar to previous, no fever, neck stiffness, neuro  findings or new symptoms to suggest more serious etiology.  I don't think SAH, ICH, meningitis, encephalitis, mass at this time.  No recent trauma.  I don't feel imaging necessary at this time.  Plan to control symptoms.    Fayrene HelperBowie Mackenize Delgadillo, PA-C 04/02/16 2243    Melene Planan Floyd, DO 04/02/16 2300

## 2016-04-08 ENCOUNTER — Emergency Department (HOSPITAL_COMMUNITY)
Admission: EM | Admit: 2016-04-08 | Discharge: 2016-04-08 | Disposition: A | Discharge status: Home / Self Care | Payer: Self-pay | Attending: Emergency Medicine | Admitting: Emergency Medicine

## 2016-04-08 ENCOUNTER — Encounter (HOSPITAL_COMMUNITY): Payer: Self-pay

## 2016-04-08 ENCOUNTER — Emergency Department (HOSPITAL_COMMUNITY): Payer: Self-pay

## 2016-04-08 ENCOUNTER — Encounter (HOSPITAL_COMMUNITY): Payer: Self-pay | Admitting: Emergency Medicine

## 2016-04-08 DIAGNOSIS — F17210 Nicotine dependence, cigarettes, uncomplicated: Secondary | ICD-10-CM | POA: Insufficient documentation

## 2016-04-08 DIAGNOSIS — R51 Headache: Secondary | ICD-10-CM | POA: Insufficient documentation

## 2016-04-08 DIAGNOSIS — Z79899 Other long term (current) drug therapy: Secondary | ICD-10-CM | POA: Insufficient documentation

## 2016-04-08 DIAGNOSIS — J45909 Unspecified asthma, uncomplicated: Secondary | ICD-10-CM | POA: Insufficient documentation

## 2016-04-08 DIAGNOSIS — R6889 Other general symptoms and signs: Secondary | ICD-10-CM

## 2016-04-08 DIAGNOSIS — R509 Fever, unspecified: Secondary | ICD-10-CM | POA: Insufficient documentation

## 2016-04-08 NOTE — ED Notes (Signed)
Patient refused rapid strep test.

## 2016-04-08 NOTE — ED Triage Notes (Addendum)
Per [facility], PT is coming from home with fatigue, fever x 1 day. Denies cough, congestion, N/V. Reports slight diarrhea that started today. 100.3 F noted by EMS. 110 ST. Pt is alert and oriented x4. Ambulatory 148/98, 110 HR, 20 RR, 98% on RA.   1000 mg Tylenol given at 1515

## 2016-04-08 NOTE — ED Triage Notes (Signed)
Per [facility]: Pt c/o headache and SOB - was seen here this morning for same and discharged after breathing treatment. Lung sounds clear, resp e/u. Pt denies CP. Given 324 ASA en route.

## 2016-04-08 NOTE — Discharge Instructions (Signed)
Take tylenol and ibuprofen as needed for fever and body aches. Be sure you are drinking plenty of fluids to stay hydrated.

## 2016-04-08 NOTE — ED Provider Notes (Signed)
[facility]-EMERGENCY DEPT Provider Note   CSN: [id number] Arrival date & time: 04/08/16  1527  By signing my name below, I, [name], attest that this documentation has been prepared under the direction and in the presence of  [facility]. Damian LeavellNeese, NP. Electronically Signed: [name], ED Scribe. 04/08/16. 4:37 PM.    History   Chief Complaint Chief Complaint  Patient presents with  . Fever    HPI Dustin Norris is a 41 y.o. male brought in by ambulance with h/o homelessness who presents to the Emergency Department complaining of waxing and waning fever (Tmax 100.3 en route) that began this morning with associated generalized myalgias, sore throat, diarrhea. Pt states he was given Tylenol with some relief of his fever by EMS en route. No worsening factors noted. He denies cough, ear pain, nausea, vomiting, abdominal pain, eye drainage, vision changes.     The history is provided by the patient. No language interpreter was used.  Fever   This is a new problem. The current episode started 6 to 12 hours ago. The problem occurs constantly. The problem has not changed since onset.The maximum temperature noted was 100 to 100.9 F. The temperature was taken using an oral thermometer. Associated symptoms include diarrhea, sore throat and muscle aches. Pertinent negatives include no vomiting and no cough. He has tried acetaminophen for the symptoms. The treatment provided mild relief.    Past Medical History:  Diagnosis Date  . Anxiety   . Asthma   . Cocaine abuse   . Homelessness   . Migraine   . Tobacco abuse     Patient Active Problem List   Diagnosis Date Noted  . Chest pain 11/23/2015  . Asthma 11/23/2015  . Tobacco abuse 11/23/2015  . Elevated lactic acid level 11/23/2015  . Cocaine abuse   . Concern about STD in male without diagnosis   . Paranoid ideation (HCC) 02/22/2015    History reviewed. No pertinent surgical history.     Home Medications    Prior to Admission  medications   Medication Sig Start Date End Date Taking? Authorizing Provider  acetaminophen (TYLENOL) 325 MG tablet Take 2 tablets (650 mg total) by mouth every 6 (six) hours as needed. 03/23/16   Barrett HenleNicole Elizabeth Nadeau, PA-C  albuterol (PROVENTIL HFA;VENTOLIN HFA) 108 (90 Base) MCG/ACT inhaler Inhale 2 puffs into the lungs every 4 (four) hours as needed for wheezing or shortness of breath. Patient not taking: Reported on 03/22/2016 08/15/15   Jerelyn ScottMartha Linker, MD  albuterol (PROVENTIL HFA;VENTOLIN HFA) 108 (90 Base) MCG/ACT inhaler Inhale 1-2 puffs into the lungs every 6 (six) hours as needed for wheezing or shortness of breath. 03/21/16   Arby BarretteMarcy Pfeiffer, MD  butalbital-acetaminophen-caffeine (FIORICET, ESGIC) 367-326-622250-325-40 MG tablet Take 1-2 tablets by mouth every 6 (six) hours as needed for headache. 04/02/16 04/02/17  [name], PA-C  ibuprofen (ADVIL,MOTRIN) 200 MG tablet Take 400 mg by mouth every 6 (six) hours as needed (migraines).    Historical Provider, MD    Family History No family history on file.  Social History Social History  Substance Use Topics  . Smoking status: Current Every Day Smoker    Packs/day: 1.00    Years: 0.00    Types: Cigarettes, Cigars  . Smokeless tobacco: Never Used  . Alcohol use No     Allergies   Patient has no known allergies.   Review of Systems Review of Systems  Constitutional: Positive for fever.  HENT: Positive for sore throat. Negative for ear  pain.   Eyes: Negative for discharge and visual disturbance.  Respiratory: Negative for cough.   Gastrointestinal: Positive for diarrhea. Negative for abdominal pain, nausea and vomiting.  All other systems reviewed and are negative.    Physical Exam Updated Vital Signs BP 142/86 (BP Location: Left Arm)   Pulse 110   Temp 97.9 F (36.6 C) (Oral)   Resp 17   Ht 5\' 7"  (1.702 m)   Wt 61.2 kg   SpO2 98%   BMI 21.14 kg/m   Physical Exam  Constitutional: He appears well-developed and  well-nourished.  Temp 99 on exam  HENT:  Head: Normocephalic and atraumatic.  Right Ear: Tympanic membrane normal.  Left Ear: Tympanic membrane normal.  Mouth/Throat: Uvula is midline and mucous membranes are normal. Posterior oropharyngeal erythema present. No posterior oropharyngeal edema.  Mild erythema, no edema.   Eyes: EOM are normal. Pupils are equal, round, and reactive to light. No scleral icterus.  Neck: Normal range of motion.  Cardiovascular: Normal rate and regular rhythm.   Pulmonary/Chest: Effort normal and breath sounds normal. No respiratory distress. He has no wheezes. He has no rales.  Abdominal: Soft. Bowel sounds are normal. There is no tenderness.  Musculoskeletal: Normal range of motion.  Lymphadenopathy:    He has cervical adenopathy (mild).  Neurological: He is alert.  Skin: Skin is warm and dry.  Nursing note and vitals reviewed.    ED Treatments / Results   DIAGNOSTIC STUDIES: Oxygen Saturation is 98% on RA, normal by my interpretation.    COORDINATION OF CARE: 4:35 PM Discussed treatment plan with pt at bedside which includes symptomatic therapy and pt agreed to plan.     Labs (all labs ordered are listed, but only abnormal results are displayed)Radiology No results found.  Procedures Procedures (including critical care time)  Medications Ordered in ED Medications - No data to display   Initial Impression / Assessment and Plan / ED Course  I have reviewed the triage vital signs and the nursing notes.  Patient with symptoms consistent with influenza.  Vitals are stable, low-grade fever.  No signs of dehydration, tolerating PO's.  Lungs are clear. Due to patient's presentation and physical exam a chest x-ray was not ordered bc likely diagnosis of flu. Pt offered rapid strep, but refuses any additional medical screening as he needs to get back to the shelter before 5 PM and states his throat does not really hurt that much. The pt has had multiple  visits in the ER for various complaints. Patient will be discharged with instructions to orally hydrate, rest, and use over-the-counter medications such as anti-inflammatories ibuprofen and Aleve for muscle aches and Tylenol for fever.    Final Clinical Impressions(s) / ED Diagnoses   Final diagnoses:  Flu-like symptoms    New Prescriptions Discharge Medication List as of 04/08/2016  4:45 PM     I personally performed the services described in this documentation, which was scribed in my presence. The recorded information has been reviewed and is accurate.    [address] [place], NP 04/08/16 1819    Geoffery Lyons, MD 04/08/16 [phone]

## 2016-04-09 ENCOUNTER — Emergency Department (HOSPITAL_COMMUNITY)
Admission: EM | Admit: 2016-04-09 | Discharge: 2016-04-09 | Disposition: A | Discharge status: Home / Self Care | Payer: Self-pay | Attending: Emergency Medicine | Admitting: Emergency Medicine

## 2016-04-09 ENCOUNTER — Encounter (HOSPITAL_COMMUNITY): Payer: Self-pay | Admitting: Emergency Medicine

## 2016-04-09 ENCOUNTER — Emergency Department (HOSPITAL_COMMUNITY)
Admission: EM | Admit: 2016-04-09 | Discharge: 2016-04-10 | Disposition: A | Discharge status: Home / Self Care | Payer: Self-pay | Attending: Emergency Medicine | Admitting: Emergency Medicine

## 2016-04-09 DIAGNOSIS — Z79899 Other long term (current) drug therapy: Secondary | ICD-10-CM | POA: Insufficient documentation

## 2016-04-09 DIAGNOSIS — R519 Headache, unspecified: Secondary | ICD-10-CM

## 2016-04-09 DIAGNOSIS — R51 Headache: Secondary | ICD-10-CM

## 2016-04-09 DIAGNOSIS — B353 Tinea pedis: Secondary | ICD-10-CM | POA: Insufficient documentation

## 2016-04-09 DIAGNOSIS — F17210 Nicotine dependence, cigarettes, uncomplicated: Secondary | ICD-10-CM | POA: Insufficient documentation

## 2016-04-09 DIAGNOSIS — J45909 Unspecified asthma, uncomplicated: Secondary | ICD-10-CM | POA: Insufficient documentation

## 2016-04-09 DIAGNOSIS — L84 Corns and callosities: Secondary | ICD-10-CM | POA: Insufficient documentation

## 2016-04-09 MED ORDER — IBUPROFEN 400 MG PO TABS
600.0000 mg | ORAL_TABLET | Freq: Once | ORAL | Status: AC
  Administered 2016-04-09: 600 mg via ORAL
  Filled 2016-04-09: qty 1

## 2016-04-09 MED ORDER — SODIUM CHLORIDE 0.9 % IV BOLUS (SEPSIS): 1000.0000 mL | Freq: Once | INTRAVENOUS | Status: DC

## 2016-04-09 MED ORDER — ACETAMINOPHEN 500 MG PO TABS
1000.0000 mg | ORAL_TABLET | Freq: Once | ORAL | Status: AC
  Administered 2016-04-09: 1000 mg via ORAL
  Filled 2016-04-09: qty 2

## 2016-04-09 NOTE — ED Notes (Signed)
Patient  Is sleeping

## 2016-04-09 NOTE — ED Provider Notes (Signed)
[facility]-EMER[facility]Y DEPT Provider Note   CSN: [id number] Arrival date & time: 04/09/16  2246  By signing my name below, I, Dustin Norris, attest that this documentation has been prepared under the direction and in the presence of  [facility]. Damian LeavellNeese, NP. Electronically Signed: Doreatha MartinEva Norris, ED Scribe. 04/09/16. 11:53 PM.    History   Chief Complaint Chief Complaint  Patient presents with  . Foot Pain    HPI Dustin Norris is a 41 y.o. male with h/o homelessness who presents to the Emergency Department complaining of moderate, gradually worsening bilateral foot pain that began a month ago with associated foul odor, blistering. He states he is homeless and walks frequently, which exacerbates his pain. He reports he changes socks frequently and got new shoes a week ago. Pt states [facility]. He denies fever.   The history is provided by the patient. No language interpreter was used.  Foot Pain  This is a new problem. The current episode started more than 1 week ago. The problem occurs constantly. The problem has been gradually worsening. The symptoms are aggravated by walking. Nothing relieves the symptoms. He has tried nothing for the symptoms. The treatment provided no relief.    Past Medical History:  Diagnosis Date  . Anxiety   . Asthma   . Cocaine abuse   . Homelessness   . Migraine   . Tobacco abuse     Patient Active Problem List   Diagnosis Date Noted  . Chest pain 11/23/2015  . Asthma 11/23/2015  . Tobacco abuse 11/23/2015  . Elevated lactic acid level 11/23/2015  . Cocaine abuse   . Concern about STD in male without diagnosis   . Paranoid ideation (HCC) 02/22/2015    History reviewed. No pertinent surgical history.     Home Medications    Prior to Admission medications   Medication Sig Start Date End Date Taking? Authorizing Provider  acetaminophen (TYLENOL) 325 MG tablet Take 2 tablets (650 mg total) by mouth every 6 (six) hours as  needed. Patient not taking: Reported on 04/09/2016 03/23/16   Barrett HenleNicole Elizabeth Nadeau, PA-C  albuterol (PROVENTIL HFA;VENTOLIN HFA) 108 [phone](90 Base) MCG/ACT inhaler Inhale 2 puffs into the lungs every 4 (four) hours as needed for wheezing or shortness of breath. Patient not taking: Reported on 03/22/2016 08/15/15   Jerelyn ScottMartha Linker, MD  albuterol (PROVENTIL HFA;VENTOLIN HFA) 108 (90 Base) MCG/ACT inhaler Inhale 1-2 puffs into the lungs every 6 (six) hours as needed for wheezing or shortness of breath. Patient not taking: Reported on 04/09/2016 03/21/16   Arby BarretteMarcy Pfeiffer, MD  butalbital-acetaminophen-caffeine (FIORICET, ESGIC) [phone] MG tablet Take 1-2 tablets by mouth every 6 (six) hours as needed for headache. Patient not taking: Reported on 04/09/2016 04/02/16 04/02/17  [name], PA-C    Family History No family history on file.  Social History Social History  Substance Use Topics  . Smoking status: Current Every Day Smoker    Packs/day: 1.00    Years: 0.00    Types: Cigarettes, Cigars  . Smokeless tobacco: Never Used  . Alcohol use No     Allergies   Patient has no known allergies.   Review of Systems Review of Systems  Constitutional: Negative for fever.  Musculoskeletal: Positive for arthralgias.       Bilateral foot pain  Skin: Positive for wound.    Physical Exam Updated Vital Signs BP 144/84 (BP Location: Left Arm)   Pulse 97   Temp 97.4 F (36.3  C) (Oral)   Resp 22   SpO2 99%   Physical Exam  Constitutional: He appears well-developed and well-nourished. No distress.  HENT:  Head: Normocephalic.  Eyes: EOM are normal.  Neck: Neck supple.  Cardiovascular: Normal rate.   Pulmonary/Chest: Effort normal.  Musculoskeletal: Normal range of motion. He exhibits tenderness.  Bilateral feet with moist cracking areas between the toes and callous area to the plantar aspect of the feet.   Neurological: He is alert.  Skin: Skin is warm and dry.  Psychiatric: He has a normal mood  and affect. His behavior is normal.  Nursing note and vitals reviewed.   ED Treatments / Results   DIAGNOSTIC STUDIES: Oxygen Saturation is 99% on RA, normal by my interpretation.    COORDINATION OF CARE: 11:39 PM Since patient is homeless and now place to shower or do foot care discussed treatment plan with pt at bedside which includes shower, foot care and pt agreed to plan.   Procedures Procedures (including critical care time)  Medications Ordered in ED Medications  nystatin (MYCOSTATIN/NYSTOP) topical powder ( Topical Given 04/10/16 0144)     Initial Impression / Assessment and Plan / ED Course  I have reviewed the triage vital signs and the nursing notes.   Pts symptoms consistent with athletes foot. Patient will be sent home with Nystatin Powder. Conservative therapies discussed and recommended. Patient advised to follow up with PCP as needed, or with worsening symptoms. Patient appears stable for discharge at this time. Return precautions discussed and outlined in discharge paperwork. Patient is agreeable to plan.   Final Clinical Impressions(s) / ED Diagnoses   Final diagnoses:  Tinea pedis of both feet  Foot callus    New Prescriptions Discharge Medication List as of 04/10/2016  2:01 AM     I personally performed the services described in this documentation, which was scribed in my presence. The recorded information has been reviewed and is accurate.    [place], [place] 04/10/16 1610    April Palumbo, MD 04/10/16 [phone]

## 2016-04-09 NOTE — ED Triage Notes (Signed)
C/o pain, foul odor, and blisters to bilateral feet x 1 week.  States he believes he has athlete's feet.  States he is changing socks often.

## 2016-04-09 NOTE — Discharge Instructions (Signed)
Return here as needed.  Follow-up with the primary care doctor or an urgent care.  Increase your fluid intake

## 2016-04-09 NOTE — ED Notes (Signed)
Pt states "My body is just worn down." when asked how pt unable to give a direct sign or symptom. Pt then denied blood work. Pt did not voice any actual complaints to this RN.

## 2016-04-09 NOTE — ED Notes (Signed)
Delay in lab draw,  PA examining pt. 

## 2016-04-09 NOTE — ED Notes (Addendum)
Pt refuse blood draw,  notified nurse

## 2016-04-09 NOTE — ED Notes (Signed)
Pt refusing IV access.

## 2016-04-09 NOTE — ED Provider Notes (Signed)
[facility] Provider Note   CSN: [id number] Arrival date & time: 04/08/16  2247     History   Chief Complaint Chief Complaint  Patient presents with  . Headache  . Shortness of Breath    HPI Dustin Norris is a 41 y.o. male.  HPI Patient presents to the emergency department with headache that started earlier today.  The patient states that he has not had any shortness of breath or other symptoms.  He states that he was recently seen for a flulike illness.  He states that the symptoms seem to have improved, but complains of headache.  He states that nothing seems make her condition better or worse.  He states he did not take any medications other than Tylenol prior to arrival. The patient denies chest pain, shortness of breath,blurred vision, neck pain, fever, cough, weakness, numbness, dizziness, anorexia, edema, abdominal pain, nausea, vomiting, diarrhea, rash, back pain, dysuria, hematemesis, bloody stool, near syncope, or syncope. Past Medical History:  Diagnosis Date  . Anxiety   . Asthma   . Cocaine abuse   . Homelessness   . Migraine   . Tobacco abuse     Patient Active Problem List   Diagnosis Date Noted  . Chest pain 11/23/2015  . Asthma 11/23/2015  . Tobacco abuse 11/23/2015  . Elevated lactic acid level 11/23/2015  . Cocaine abuse   . Concern about STD in male without diagnosis   . Paranoid ideation (HCC) 02/22/2015    History reviewed. No pertinent surgical history.     Home Medications    Prior to Admission medications   Medication Sig Start Date End Date Taking? Authorizing Provider  acetaminophen (TYLENOL) 325 MG tablet Take 2 tablets (650 mg total) by mouth every 6 (six) hours as needed. Patient not taking: Reported on 04/09/2016 03/23/16   Barrett HenleNicole Elizabeth Nadeau, PA-C  albuterol (PROVENTIL HFA;VENTOLIN HFA) 108 [phone](90 Base) MCG/ACT inhaler Inhale 2 puffs into the lungs every 4 (four) hours as needed for wheezing or shortness of  breath. Patient not taking: Reported on 03/22/2016 08/15/15   Jerelyn ScottMartha Linker, MD  albuterol (PROVENTIL HFA;VENTOLIN HFA) 108 (90 Base) MCG/ACT inhaler Inhale 1-2 puffs into the lungs every 6 (six) hours as needed for wheezing or shortness of breath. Patient not taking: Reported on 04/09/2016 03/21/16   Arby BarretteMarcy Pfeiffer, MD  butalbital-acetaminophen-caffeine (FIORICET, ESGIC) [phone] MG tablet Take 1-2 tablets by mouth every 6 (six) hours as needed for headache. Patient not taking: Reported on 04/09/2016 04/02/16 04/02/17  [name], PA-C    Family History No family history on file.  Social History Social History  Substance Use Topics  . Smoking status: Current Every Day Smoker    Packs/day: 1.00    Years: 0.00    Types: Cigarettes, Cigars  . Smokeless tobacco: Never Used  . Alcohol use No     Allergies   Patient has no known allergies.   Review of Systems Review of Systems All other systems negative except as documented in the HPI. All pertinent positives and negatives as reviewed in the HPI.  Physical Exam Updated Vital Signs BP (!) 104/53 (BP Location: Right Arm)   Pulse 72   Temp 98.8 F (37.1 C) (Oral)   Resp 16   SpO2 100%   Physical Exam  Constitutional: He is oriented to person, place, and time. He appears well-developed and well-nourished. No distress.  HENT:  Head: Normocephalic and atraumatic.  Mouth/Throat: Oropharynx is clear and moist.  Eyes: Pupils are equal, round, and  reactive to light.  Neck: Normal range of motion. Neck supple.  Cardiovascular: Normal rate, regular rhythm and normal heart sounds.  Exam reveals no gallop and no friction rub.   No murmur heard. Pulmonary/Chest: Effort normal and breath sounds normal. No respiratory distress. He has no wheezes.  Abdominal: Soft. Bowel sounds are normal. He exhibits no distension. There is no tenderness.  Neurological: He is alert and oriented to person, place, and time. He exhibits normal muscle tone.  Coordination normal.  Skin: Skin is warm and dry. Capillary refill takes less than 2 seconds. No rash noted. No erythema.  Psychiatric: He has a normal mood and affect. His behavior is normal.  Nursing note and vitals reviewed.    ED Treatments / Results  Labs (all labs ordered are listed, but only abnormal results are displayed) Labs Reviewed  BASIC METABOLIC PANEL  CBC  I-STAT TROPOININ, ED    EKG  EKG Interpretation  Date/Time:  Thursday April 08 2016 22:56:45 EST Ventricular Rate:  79 PR Interval:  134 QRS Duration: 84 QT Interval:  372 QTC Calculation: 426 R Axis:   83 Text Interpretation:  Normal sinus rhythm Moderate voltage criteria for LVH, may be normal variant Borderline ECG No significant change since last tracing Confirmed by POLLINA  MD, Jackquline Branca [phone]) on 04/09/2016 12:57:11 AM       Radiology Dg Chest 2 View  Result Date: 04/08/2016 CLINICAL DATA:  Headache, dyspnea, vertigo. EXAM: CHEST  2 VIEW COMPARISON:  04/01/2016 FINDINGS: The heart size and mediastinal contours are within normal limits. Both lungs are clear. The visualized skeletal structures are unremarkable. IMPRESSION: No active cardiopulmonary disease. Electronically Signed   By: Ellery Plunk M.D.   On: 04/08/2016 23:22    Procedures Procedures (including critical care time)  Medications Ordered in ED Medications  sodium chloride 0.9 % bolus 1,000 mL (1,000 mLs Intravenous Refused 04/09/16 0141)  ibuprofen (ADVIL,MOTRIN) tablet 600 mg (600 mg Oral Given 04/09/16 0242)  acetaminophen (TYLENOL) tablet 1,000 mg (1,000 mg Oral Given 04/09/16 0242)     Initial Impression / Assessment and Plan / ED Course  I have reviewed the triage vital signs and the nursing notes.  Pertinent labs & imaging results that were available during my care of the patient were reviewed by me and considered in my medical decision making (see chart for details).     Patient is refusing any lab draw for further  testing.  Patient will be given treatment for the headache.  Told to return here as needed.  Patient agrees the plan and all shins were answered.  Patient mainly requests food and something to drink  Final Clinical Impressions(s) / ED Diagnoses   Final diagnoses:  None    New Prescriptions New Prescriptions   No medications on file     Charlestine Night, PA-C 04/09/16 0539    Gilda Crease, MD 04/09/16 [phone]

## 2016-04-10 MED ORDER — NYSTATIN 100000 UNIT/GM EX POWD
Freq: Two times a day (BID) | CUTANEOUS | Status: DC
  Administered 2016-04-10: 02:00:00 via TOPICAL
  Filled 2016-04-10: qty 15

## 2016-04-10 NOTE — Discharge Instructions (Signed)
Use the powder 2 times a day.

## 2016-04-10 NOTE — ED Notes (Signed)
Patient able to ambulate independently.  Pt requested food and drink before d/c.  Given [place] sandwich bag with sprite.  Pt also provided warm blanket "for the lobby".

## 2016-07-11 ENCOUNTER — Encounter (HOSPITAL_COMMUNITY): Payer: Self-pay

## 2016-07-11 DIAGNOSIS — J069 Acute upper respiratory infection, unspecified: Secondary | ICD-10-CM | POA: Insufficient documentation

## 2016-07-11 DIAGNOSIS — J45909 Unspecified asthma, uncomplicated: Secondary | ICD-10-CM | POA: Insufficient documentation

## 2016-07-11 DIAGNOSIS — Z791 Long term (current) use of non-steroidal anti-inflammatories (NSAID): Secondary | ICD-10-CM | POA: Insufficient documentation

## 2016-07-11 DIAGNOSIS — F17210 Nicotine dependence, cigarettes, uncomplicated: Secondary | ICD-10-CM | POA: Insufficient documentation

## 2016-07-11 NOTE — ED Triage Notes (Signed)
States EMS pt homeless and while walking on cell phone called 911 that he was short of breath and chest pain states he just got back in town.

## 2016-07-11 NOTE — ED Notes (Signed)
Pt stated he just got back in [place] and [place], c/o [place].

## 2016-07-12 ENCOUNTER — Emergency Department (HOSPITAL_COMMUNITY)
Admission: EM | Admit: 2016-07-12 | Discharge: 2016-07-12 | Disposition: A | Discharge status: Home / Self Care | Payer: Self-pay | Attending: Emergency Medicine | Admitting: Emergency Medicine

## 2016-07-12 ENCOUNTER — Encounter (HOSPITAL_COMMUNITY): Payer: Self-pay | Admitting: Emergency Medicine

## 2016-07-12 ENCOUNTER — Emergency Department (HOSPITAL_COMMUNITY): Payer: Self-pay

## 2016-07-12 DIAGNOSIS — J069 Acute upper respiratory infection, unspecified: Secondary | ICD-10-CM

## 2016-07-12 DIAGNOSIS — R51 Headache: Secondary | ICD-10-CM | POA: Insufficient documentation

## 2016-07-12 DIAGNOSIS — F17210 Nicotine dependence, cigarettes, uncomplicated: Secondary | ICD-10-CM | POA: Insufficient documentation

## 2016-07-12 DIAGNOSIS — R079 Chest pain, unspecified: Secondary | ICD-10-CM

## 2016-07-12 DIAGNOSIS — J45909 Unspecified asthma, uncomplicated: Secondary | ICD-10-CM | POA: Insufficient documentation

## 2016-07-12 MED ORDER — ALBUTEROL SULFATE HFA 108 (90 BASE) MCG/ACT IN AERS
2.0000 | INHALATION_SPRAY | Freq: Once | RESPIRATORY_TRACT | Status: AC
  Administered 2016-07-12: 2 via RESPIRATORY_TRACT
  Filled 2016-07-12: qty 6.7

## 2016-07-12 NOTE — Discharge Instructions (Signed)
We discussed doing blood work to evaluate for heart attack and blood clots given your chest pain and recently returning from out of state. You declined blood work. If you change your mind or your symptoms do not improve or worsen, please return to the ER immediately.

## 2016-07-12 NOTE — ED Triage Notes (Signed)
Pt presents to ED for assessment of migraine to front of head starting upon waking this morning.  Pt denies any OTC meds.  Pt c/o blurred vision and dizziness with his headache.  Pt falling asleep during triage.

## 2016-07-12 NOTE — ED Provider Notes (Signed)
[facility]-EMERGENCY DEPT Provider Note   CSN: [id number] Arrival date & time: 07/11/16  2101  By signing my name below, I, Dustin Norris, attest that this documentation has been prepared under the direction and in the presence of Pricilla LovelessGoldston, Rindy Kollman, MD. Electronically Signed: Karren CobbleNy'Kea Norris, ED Scribe. 07/12/16. 1:28 AM.  History   Chief Complaint Chief Complaint  Patient presents with  . Chest Pain   The history is provided by the patient. No language interpreter was used.    HPI Comments: Dustin Norris is a 41 y.o. male with a PMHx of asthma, anxiety, migraine,and cocaine abuse,  brought in by ambulance, who presents to the Emergency Department complaining of sudden onset, persistent shortness of breath that began at 8:00 pm. Pt notes associated pressure-like chest pain, cough with yellow phlegm production, and nausea. Pt reports he was sitting on he curb when he began to experience chest pain and shortness of breath. Hx of asthma but reports he does not currently have his inhaler. He previously had a similar episode that was associated with his asthma. He states he got off of a plane ride coming from [place] during the afternoon yesterday. No alleviating factors noted. Denies emesis, or peripheral edema. Not other acute associated symptoms noted.  Feels like his prior asthma and is asking for an inhaler.  Past Medical History:  Diagnosis Date  . Anxiety   . Asthma   . Cocaine abuse   . Homelessness   . Migraine   . Tobacco abuse     Patient Active Problem List   Diagnosis Date Noted  . Chest pain 11/23/2015  . Asthma 11/23/2015  . Tobacco abuse 11/23/2015  . Elevated lactic acid level 11/23/2015  . Cocaine abuse   . Concern about STD in male without diagnosis   . Paranoid ideation (HCC) 02/22/2015    History reviewed. No pertinent surgical history.     Home Medications    Prior to Admission medications   Medication Sig Start Date End Date Taking? Authorizing Provider  ibuprofen  (ADVIL,MOTRIN) 200 MG tablet Take 400 mg by mouth every 6 (six) hours as needed for headache, mild pain or moderate pain.   Yes [provider]  acetaminophen (TYLENOL) 325 MG tablet Take 2 tablets (650 mg total) by mouth every 6 (six) hours as needed. Patient not taking: Reported on 04/09/2016 03/23/16   Barrett HenleNadeau, Nicole Elizabeth, PA-C  albuterol (PROVENTIL HFA;VENTOLIN HFA) 108 [phone](90 Base) MCG/ACT inhaler Inhale 2 puffs into the lungs every 4 (four) hours as needed for wheezing or shortness of breath. Patient not taking: Reported on 03/22/2016 08/15/15   Jerelyn ScottLinker, Martha, MD  albuterol (PROVENTIL HFA;VENTOLIN HFA) 108 (90 Base) MCG/ACT inhaler Inhale 1-2 puffs into the lungs every 6 (six) hours as needed for wheezing or shortness of breath. Patient not taking: Reported on 04/09/2016 03/21/16   Arby BarrettePfeiffer, Marcy, MD  butalbital-acetaminophen-caffeine (FIORICET, ESGIC) [phone] MG tablet Take 1-2 tablets by mouth every 6 (six) hours as needed for headache. Patient not taking: Reported on 04/09/2016 04/02/16 04/02/17  Fayrene Helperran, Bowie, PA-C    Family History History reviewed. No pertinent family history.  Social History Social History  Substance Use Topics  . Smoking status: Current Every Day Smoker    Packs/day: 1.00    Years: 0.00    Types: Cigarettes, Cigars  . Smokeless tobacco: Never Used  . Alcohol use No   Allergies   Patient has no known allergies.  Review of Systems Review of Systems  Respiratory: Positive for cough and shortness  of breath.   Cardiovascular: Positive for chest pain. Negative for leg swelling.  Gastrointestinal: Positive for nausea. Negative for vomiting.  All other systems reviewed and are negative.  Physical Exam Updated Vital Signs BP (!) 113/59 (BP Location: Right Arm)   Pulse 69   Temp 97.8 F (36.6 C) (Oral)   Resp 12   Ht 5\' 9"  (1.753 m)   Wt 61.2 kg (135 lb)   SpO2 98%   BMI 19.94 kg/m   Physical Exam  Constitutional: He is oriented to person, place,  and time. He appears well-developed and well-nourished.  HENT:  Head: Normocephalic and atraumatic.  Right Ear: External ear normal.  Left Ear: External ear normal.  Nose: Nose normal.  Eyes: Right eye exhibits no discharge. Left eye exhibits no discharge.  Neck: Neck supple.  Cardiovascular: Normal rate, regular rhythm and normal heart sounds.   Pulmonary/Chest: Effort normal and breath sounds normal. He has no wheezes. He exhibits no tenderness.  Abdominal: Soft. There is no tenderness.  Musculoskeletal: He exhibits no edema.  Neurological: He is alert and oriented to person, place, and time.  Skin: Skin is warm and dry.  Nursing note and vitals reviewed.  ED Treatments / Results  DIAGNOSTIC STUDIES: Oxygen Saturation is 99% on RA, normal by my interpretation.   COORDINATION OF CARE: 1:22 AM-Discussed next steps with pt. Pt verbalized understanding and is agreeable with the plan.   Labs (all labs ordered are listed, but only abnormal results are displayed) Labs Reviewed - No data to display  EKG  EKG Interpretation  Date/Time:  Sunday July 11 2016 21:46:30 EDT Ventricular Rate:  82 PR Interval:    QRS Duration: 75 QT Interval:  342 QTC Calculation: 400 R Axis:   82 Text Interpretation:  Sinus rhythm Probable left atrial enlargement ST elev, probable normal early repol pattern no significant change since Mar 2018 Confirmed by Pricilla Loveless [phone]) on 07/12/2016 12:55:21 AM      Radiology Dg Chest 2 View  Result Date: 07/12/2016 CLINICAL DATA:  Initial evaluation for acute cough. EXAM: CHEST  2 VIEW COMPARISON:  Prior radiograph from 04/27/2016. FINDINGS: Cardiac and mediastinal silhouettes are stable in size and contour, and remain within normal limits. Lungs mildly hypoinflated. Apparent hazy asymmetric opacity overlying the peripheral left lung base on frontal projection favored to be positional and/or related overlying soft tissue attenuation. No definite focal  infiltrates. No pulmonary edema or pleural effusion. No pneumothorax. No acute osseus abnormality. IMPRESSION: Shallow lung inflation with no active cardiopulmonary disease identified. Electronically Signed   By: Rise Mu M.D.   On: 07/12/2016 02:18    Procedures Procedures (including critical care time)  Medications Ordered in ED Medications  albuterol (PROVENTIL HFA;VENTOLIN HFA) 108 (90 Base) MCG/ACT inhaler 2 puff (2 puffs Inhalation Given 07/12/16 0233)    Initial Impression / Assessment and Plan / ED Course  I have reviewed the triage vital signs and the nursing notes.  Pertinent labs & imaging results that were available during my care of the patient were reviewed by me and considered in my medical decision making (see chart for details).     Given the acute cough with mild sputum production this is probably infectious in etiology. No fevers or obvious consolidation or abnormal lung sounds to suggest pneumonia. However given he also has chest pain and just came off a plane I discussed the need for workup for pulmonary embolism. Given cocaine abuse I wanted to rule out ACS as well. However  the patient is declining all blood work. I discussed that without this blood work and cannot help rule out or rule in these diseases. I discussed associated morbidity and mortality including death. He verbalized understanding but still not want testing. Patient appears to have medical decision-making capacity. He states he feels better with albuterol. At this point will treat symptomatically as a viral URI, he will take inhaler home and discussed return precautions.  Final Clinical Impressions(s) / ED Diagnoses   Final diagnoses:  Viral upper respiratory tract infection  Nonspecific chest pain    New Prescriptions Discharge Medication List as of 07/12/2016  2:48 AM     I personally performed the services described in this documentation, which was scribed in my presence. The recorded  information has been reviewed and is accurate.     Pricilla Loveless, MD 07/12/16 [phone]

## 2016-07-13 ENCOUNTER — Emergency Department (HOSPITAL_COMMUNITY)
Admission: EM | Admit: 2016-07-13 | Discharge: 2016-07-13 | Disposition: A | Discharge status: Home / Self Care | Payer: Self-pay | Attending: Emergency Medicine | Admitting: Emergency Medicine

## 2016-07-13 DIAGNOSIS — R51 Headache: Secondary | ICD-10-CM

## 2016-07-13 DIAGNOSIS — R519 Headache, unspecified: Secondary | ICD-10-CM

## 2016-07-13 MED ORDER — SODIUM CHLORIDE 0.9 % IV BOLUS (SEPSIS): 1000.0000 mL | Freq: Once | INTRAVENOUS | Status: DC

## 2016-07-13 MED ORDER — KETOROLAC TROMETHAMINE 30 MG/ML IJ SOLN
30.0000 mg | Freq: Once | INTRAMUSCULAR | Status: DC
  Filled 2016-07-13: qty 1

## 2016-07-13 MED ORDER — METHYLPREDNISOLONE SODIUM SUCC 125 MG IJ SOLR
125.0000 mg | Freq: Once | INTRAMUSCULAR | Status: DC
  Filled 2016-07-13: qty 2

## 2016-07-13 MED ORDER — ACETAMINOPHEN 500 MG PO TABS
1000.0000 mg | ORAL_TABLET | Freq: Once | ORAL | Status: AC
  Administered 2016-07-13: 1000 mg via ORAL
  Filled 2016-07-13: qty 2

## 2016-07-13 MED ORDER — METOCLOPRAMIDE HCL 5 MG/ML IJ SOLN
10.0000 mg | Freq: Once | INTRAMUSCULAR | Status: DC
  Filled 2016-07-13: qty 2

## 2016-07-13 NOTE — ED Provider Notes (Signed)
[facility]-EMERGENCY DEPT Provider Note   CSN: [id number] Arrival date & time: 07/12/16  2033     History   Chief Complaint Chief Complaint  Patient presents with  . Headache    HPI Dustin Norris is a 41 y.o. male.  HPI   Dustin Norris is a 41 y.o. male, with a history of asthma, cocaine abuse, migraines, and homelessness, presenting to the ED with a headache beginning yesterday. Bilateral frontal, throbbing, 10/10, nonradiating. Consistent with previous headaches. Has not taken any medications for his complaint. Denies illicit drug use. Denies neuro deficits, visual deficits, LOC, falls/trauma, neck pain, fever/chills, N/V, or any other complaints.       Past Medical History:  Diagnosis Date  . Anxiety   . Asthma   . Cocaine abuse   . Homelessness   . Migraine   . Tobacco abuse     Patient Active Problem List   Diagnosis Date Noted  . Chest pain 11/23/2015  . Asthma 11/23/2015  . Tobacco abuse 11/23/2015  . Elevated lactic acid level 11/23/2015  . Cocaine abuse   . Concern about STD in male without diagnosis   . Paranoid ideation (HCC) 02/22/2015    History reviewed. No pertinent surgical history.     Home Medications    Prior to Admission medications   Medication Sig Start Date End Date Taking? Authorizing Provider  acetaminophen (TYLENOL) 325 MG tablet Take 2 tablets (650 mg total) by mouth every 6 (six) hours as needed. Patient not taking: Reported on 04/09/2016 03/23/16   Barrett Henle, PA-C  albuterol (PROVENTIL HFA;VENTOLIN HFA) [phone] Base) MCG/ACT inhaler Inhale 2 puffs into the lungs every 4 (four) hours as needed for wheezing or shortness of breath. Patient not taking: Reported on 03/22/2016 08/15/15   Jerelyn Scott, MD  albuterol (PROVENTIL HFA;VENTOLIN HFA) 108 (90 Base) MCG/ACT inhaler Inhale 1-2 puffs into the lungs every 6 (six) hours as needed for wheezing or shortness of breath. Patient not taking: Reported on 04/09/2016 03/21/16    Arby Barrette, MD  butalbital-acetaminophen-caffeine (FIORICET, ESGIC) [phone] MG tablet Take 1-2 tablets by mouth every 6 (six) hours as needed for headache. Patient not taking: Reported on 04/09/2016 04/02/16 04/02/17  Fayrene Helper, PA-C  ibuprofen (ADVIL,MOTRIN) 200 MG tablet Take 400 mg by mouth every 6 (six) hours as needed for headache, mild pain or moderate pain.    [provider]    Family History History reviewed. No pertinent family history.  Social History Social History  Substance Use Topics  . Smoking status: Current Every Day Smoker    Packs/day: 1.00    Years: 0.00    Types: Cigarettes, Cigars  . Smokeless tobacco: Never Used  . Alcohol use No     Allergies   Patient has no known allergies.   Review of Systems Review of Systems  Constitutional: Negative for chills and fever.  Respiratory: Negative for shortness of breath.   Cardiovascular: Negative for chest pain.  Gastrointestinal: Negative for abdominal pain, nausea and vomiting.  Musculoskeletal: Negative for neck pain and neck stiffness.  Skin: Negative for rash.  Neurological: Positive for headaches. Negative for dizziness, weakness, light-headedness and numbness.  All other systems reviewed and are negative.    Physical Exam Updated Vital Signs BP (!) 117/59 (BP Location: Left Arm)   Pulse (!) 58   Temp 98.2 F (36.8 C) (Oral)   Resp 17   SpO2 100%   Physical Exam  Constitutional: He appears well-developed and well-nourished. No distress.  HENT:  Head: Normocephalic and atraumatic.  Mouth/Throat: Oropharynx is clear and moist.  Eyes: Conjunctivae and EOM are normal. Pupils are equal, round, and reactive to light.  Neck: Normal range of motion. Neck supple.  Cardiovascular: Normal rate, regular rhythm, normal heart sounds and intact distal pulses.   Pulmonary/Chest: Effort normal and breath sounds normal. No respiratory distress.  Abdominal: Soft. There is no tenderness. There is no  guarding.  Musculoskeletal: He exhibits no edema.  Normal motor function intact in all extremities and spine. No midline spinal tenderness.   Lymphadenopathy:    He has no cervical adenopathy.  Neurological: He is alert.  No sensory deficits. Strength 5/5 in all extremities. No gait disturbance. Coordination intact including heel to shin and finger to nose. Cranial nerves III-XII grossly intact. No facial droop.   Skin: Skin is warm and dry. No rash noted. He is not diaphoretic.  Psychiatric: He has a normal mood and affect. His behavior is normal.  Nursing note and vitals reviewed.    ED Treatments / Results  Labs (all labs ordered are listed, but only abnormal results are displayed) Labs Reviewed - No data to display  EKG  EKG Interpretation None       Radiology Dg Chest 2 View  Result Date: 07/12/2016 CLINICAL DATA:  Initial evaluation for acute cough. EXAM: CHEST  2 VIEW COMPARISON:  Prior radiograph from 04/27/2016. FINDINGS: Cardiac and mediastinal silhouettes are stable in size and contour, and remain within normal limits. Lungs mildly hypoinflated. Apparent hazy asymmetric opacity overlying the peripheral left lung base on frontal projection favored to be positional and/or related overlying soft tissue attenuation. No definite focal infiltrates. No pulmonary edema or pleural effusion. No pneumothorax. No acute osseus abnormality. IMPRESSION: Shallow lung inflation with no active cardiopulmonary disease identified. Electronically Signed   By: Rise MuBenjamin  McClintock M.D.   On: 07/12/2016 02:18    Procedures Procedures (including critical care time)  Medications Ordered in ED Medications  acetaminophen (TYLENOL) tablet 1,000 mg (not administered)     Initial Impression / Assessment and Plan / ED Course  I have reviewed the triage vital signs and the nursing notes.  Pertinent labs & imaging results that were available during my care of the patient were reviewed by me and  considered in my medical decision making (see chart for details).     Patient presents with a headache. No red flag symptoms. No neuro or functional deficits. Plan of care including IV fluids, labs, and IV medications was discussed with the patient. Patient initially agreed to the plan. When RN went to initiate treatment, patient states his headache is "not that bad." Requests tylenol and discharge. Denies additional complaints. The patient was given instructions for home care as well as return precautions. Patient voices understanding of these instructions, accepts the plan, and is comfortable with discharge.    Final Clinical Impressions(s) / ED Diagnoses   Final diagnoses:  Bad headache    New Prescriptions New Prescriptions   No medications on file     Concepcion LivingJoy, Lekia Nier C, PA-C 07/13/16 0203    Pricilla LovelessGoldston, Scott, MD 07/14/16 [phone]

## 2016-07-13 NOTE — ED Notes (Signed)
This RN went to start IV and administer pain medicine, pt refusing. He would like tylenol and "to go home" EDP aware, will admin tylenol and DC.

## 2016-07-13 NOTE — Discharge Instructions (Signed)
May take Tylenol, ibuprofen, naproxen for headache. Follow up with a primary care provider for any future management of this issue.

## 2016-07-20 ENCOUNTER — Emergency Department (HOSPITAL_COMMUNITY)
Admission: EM | Admit: 2016-07-20 | Discharge: 2016-07-20 | Disposition: A | Discharge status: Home / Self Care | Payer: Self-pay | Attending: Emergency Medicine | Admitting: Emergency Medicine

## 2016-07-20 ENCOUNTER — Encounter (HOSPITAL_COMMUNITY): Payer: Self-pay | Admitting: Emergency Medicine

## 2016-07-20 DIAGNOSIS — F17210 Nicotine dependence, cigarettes, uncomplicated: Secondary | ICD-10-CM | POA: Insufficient documentation

## 2016-07-20 DIAGNOSIS — J45909 Unspecified asthma, uncomplicated: Secondary | ICD-10-CM | POA: Insufficient documentation

## 2016-07-20 DIAGNOSIS — R51 Headache: Secondary | ICD-10-CM | POA: Insufficient documentation

## 2016-07-20 DIAGNOSIS — R519 Headache, unspecified: Secondary | ICD-10-CM

## 2016-07-20 MED ORDER — ACETAMINOPHEN 500 MG PO TABS
1000.0000 mg | ORAL_TABLET | Freq: Once | ORAL | Status: AC
  Administered 2016-07-20: 1000 mg via ORAL
  Filled 2016-07-20: qty 2

## 2016-07-20 NOTE — ED Triage Notes (Signed)
[place] [facility] pt from park, wandering outside all morning due to homeless. Pt c/o asthma exacerbation. Lungs clear in all fields. Pt states does not have inhaler.

## 2016-07-20 NOTE — ED Notes (Signed)
EDP at bedside  

## 2016-07-20 NOTE — Discharge Instructions (Signed)
Return to the ED with any concerns including fainting, chest pain, changes in vision or speech, weakness of arms or legs, difficulty breathing, decreased level of alertness/lethargy, or any other alarming symptoms

## 2016-07-20 NOTE — ED Provider Notes (Signed)
[facility]-EMERGENCY DEPT Provider Note   CSN: [id number] Arrival date & time: 07/20/16  1619     History   Chief Complaint Chief Complaint  Patient presents with  . Asthma    HPI Dustin Norris is a 41 y.o. male.  HPI  Pt with hx of asthma, homelessness, migraines presenting with c/o feeling like he was wheezing and also a headache.  He states he was walking around in the heat today and did not have his inhaler with him.  He is staying at a homeless shelter now and has the inhaler at the shelter.  He also states he developed a frontal headache today as well.  He is requesting some tylenol. He states he feels better now that he is inside in the cool and has been able to rest.  He has called the shelter and they have accepted him back for tonight.  He no longer feels like he is wheezing.  There are no other associated systemic symptoms, there are no other alleviating or modifying factors.   Past Medical History:  Diagnosis Date  . Anxiety   . Asthma   . Cocaine abuse   . Homelessness   . Migraine   . Tobacco abuse     Patient Active Problem List   Diagnosis Date Noted  . Chest pain 11/23/2015  . Asthma 11/23/2015  . Tobacco abuse 11/23/2015  . Elevated lactic acid level 11/23/2015  . Cocaine abuse   . Concern about STD in male without diagnosis   . Paranoid ideation (HCC) 02/22/2015    History reviewed. No pertinent surgical history.     Home Medications    Prior to Admission medications   Medication Sig Start Date End Date Taking? Authorizing Provider  acetaminophen (TYLENOL) 325 MG tablet Take 2 tablets (650 mg total) by mouth every 6 (six) hours as needed. Patient not taking: Reported on 04/09/2016 03/23/16   Barrett Henle, PA-C  albuterol (PROVENTIL HFA;VENTOLIN HFA) 108 [phone] Base) MCG/ACT inhaler Inhale 2 puffs into the lungs every 4 (four) hours as needed for wheezing or shortness of breath. Patient not taking: Reported on 03/22/2016 08/15/15   Jerelyn Scott, MD  albuterol (PROVENTIL HFA;VENTOLIN HFA) 108 (90 Base) MCG/ACT inhaler Inhale 1-2 puffs into the lungs every 6 (six) hours as needed for wheezing or shortness of breath. Patient not taking: Reported on 04/09/2016 03/21/16   Arby Barrette, MD  butalbital-acetaminophen-caffeine (FIORICET, ESGIC) [phone] MG tablet Take 1-2 tablets by mouth every 6 (six) hours as needed for headache. Patient not taking: Reported on 04/09/2016 04/02/16 04/02/17  Fayrene Helper, PA-C  ibuprofen (ADVIL,MOTRIN) 200 MG tablet Take 400 mg by mouth every 6 (six) hours as needed for headache, mild pain or moderate pain.    [provider]    Family History No family history on file.  Social History Social History  Substance Use Topics  . Smoking status: Current Every Day Smoker    Packs/day: 1.00    Years: 0.00    Types: Cigarettes, Cigars  . Smokeless tobacco: Never Used  . Alcohol use No     Allergies   Patient has no known allergies.   Review of Systems Review of Systems  ROS reviewed and all otherwise negative except for mentioned in HPI   Physical Exam Updated Vital Signs BP (!) 156/87   Pulse 96   Temp 98 F (36.7 C) (Oral)   Resp 18   SpO2 100%  Vitals reviewed Physical Exam Physical Examination: General appearance -  alert, well appearing, and in no distress Mental status - alert, oriented to person, place, and time Eyes - no conjunctival injection, no scleral icterus Chest - clear to auscultation, no wheezes, rales or rhonchi, symmetric air entry Heart - normal rate, regular rhythm, normal S1, S2, no murmurs, rubs, clicks or gallops Abdomen - soft, nontender, nondistended, no masses or organomegaly Neurological - alert, oriented, normal speech, normal strength and sensation in extremities x 4 Extremities - peripheral pulses normal, no pedal edema, no clubbing or cyanosis Skin - normal coloration and turgor, no rashes  ED Treatments / Results  Labs (all labs ordered are  listed, but only abnormal results are displayed) Labs Reviewed - No data to display  EKG  EKG Interpretation  Date/Time:  Tuesday July 20 2016 16:42:27 EDT Ventricular Rate:  114 PR Interval:    QRS Duration: 74 QT Interval:  308 QTC Calculation: 425 R Axis:   78 Text Interpretation:  Sinus tachycardia Since previous tracing rate faster Confirmed by Jerelyn ScottLinker, Kynlie Jane [id number]) on 07/20/2016 7:34:47 PM       Radiology No results found.  Procedures Procedures (including critical care time)  Medications Ordered in ED Medications  acetaminophen (TYLENOL) tablet 1,000 mg (1,000 mg Oral Given 07/20/16 2012)     Initial Impression / Assessment and Plan / ED Course  I have reviewed the triage vital signs and the nursing notes.  Pertinent labs & imaging results that were available during my care of the patient were reviewed by me and considered in my medical decision making (see chart for details).     Pt presenting with c/o wheezing and headache.  At the time of my evaluation wheezing has resolved and he requests only medication for headache- he states he has place back at shelter tonight and has his albuterol inhaler to use there.  He feels much better after getting in out of the heat.  Discharged with strict return precautions.  Pt agreeable with plan.  Final Clinical Impressions(s) / ED Diagnoses   Final diagnoses:  Nonintractable headache, unspecified chronicity pattern, unspecified headache type    New Prescriptions Discharge Medication List as of 07/20/2016  8:04 PM       Jerelyn ScottLinker, Aine Strycharz, MD 07/21/16 [phone]

## 2016-07-21 ENCOUNTER — Encounter (HOSPITAL_COMMUNITY): Payer: Self-pay | Admitting: *Deleted

## 2016-07-21 ENCOUNTER — Emergency Department (HOSPITAL_COMMUNITY)
Admission: EM | Admit: 2016-07-21 | Discharge: 2016-07-21 | Disposition: A | Discharge status: Home / Self Care | Payer: Self-pay | Attending: Emergency Medicine | Admitting: Emergency Medicine

## 2016-07-21 ENCOUNTER — Encounter (HOSPITAL_COMMUNITY): Payer: Self-pay

## 2016-07-21 ENCOUNTER — Emergency Department (HOSPITAL_COMMUNITY)
Admission: EM | Admit: 2016-07-21 | Discharge: 2016-07-24 | Disposition: A | Discharge status: Other Acute Inpatient Hospital | Payer: Self-pay | Attending: Physician Assistant | Admitting: Physician Assistant

## 2016-07-21 DIAGNOSIS — R45850 Homicidal ideations: Secondary | ICD-10-CM

## 2016-07-21 DIAGNOSIS — R4689 Other symptoms and signs involving appearance and behavior: Secondary | ICD-10-CM

## 2016-07-21 DIAGNOSIS — F918 Other conduct disorders: Secondary | ICD-10-CM | POA: Insufficient documentation

## 2016-07-21 DIAGNOSIS — Z5321 Procedure and treatment not carried out due to patient leaving prior to being seen by health care provider: Secondary | ICD-10-CM | POA: Insufficient documentation

## 2016-07-21 DIAGNOSIS — J0110 Acute frontal sinusitis, unspecified: Secondary | ICD-10-CM | POA: Insufficient documentation

## 2016-07-21 DIAGNOSIS — J0100 Acute maxillary sinusitis, unspecified: Secondary | ICD-10-CM | POA: Insufficient documentation

## 2016-07-21 DIAGNOSIS — J45909 Unspecified asthma, uncomplicated: Secondary | ICD-10-CM | POA: Insufficient documentation

## 2016-07-21 DIAGNOSIS — F17210 Nicotine dependence, cigarettes, uncomplicated: Secondary | ICD-10-CM | POA: Insufficient documentation

## 2016-07-21 DIAGNOSIS — R0602 Shortness of breath: Secondary | ICD-10-CM | POA: Insufficient documentation

## 2016-07-21 DIAGNOSIS — Z79899 Other long term (current) drug therapy: Secondary | ICD-10-CM | POA: Insufficient documentation

## 2016-07-21 LAB — URINALYSIS, ROUTINE W REFLEX MICROSCOPIC
Bilirubin Urine: NEGATIVE
Glucose, UA: >=500 mg/dL — AB
Hgb urine dipstick: NEGATIVE
KETONES UR: NEGATIVE mg/dL
Leukocytes, UA: NEGATIVE
Nitrite: NEGATIVE
PH: 5 (ref 5.0–8.0)
PROTEIN: NEGATIVE mg/dL
SQUAMOUS EPITHELIAL / LPF: NONE SEEN
Specific Gravity, Urine: 1.03 (ref 1.005–1.030)

## 2016-07-21 LAB — RAPID URINE DRUG SCREEN, HOSP PERFORMED
Amphetamines: NOT DETECTED
BENZODIAZEPINES: NOT DETECTED
Barbiturates: NOT DETECTED
COCAINE: POSITIVE — AB
Opiates: NOT DETECTED
Tetrahydrocannabinol: NOT DETECTED

## 2016-07-21 MED ORDER — IBUPROFEN 400 MG PO TABS: 600.0000 mg | ORAL_TABLET | Freq: Three times a day (TID) | ORAL | Status: DC | PRN

## 2016-07-21 MED ORDER — ACETAMINOPHEN 325 MG PO TABS: 650.0000 mg | ORAL_TABLET | ORAL | Status: DC | PRN

## 2016-07-21 MED ORDER — ZOLPIDEM TARTRATE 5 MG PO TABS: 5.0000 mg | ORAL_TABLET | Freq: Every evening | ORAL | Status: DC | PRN

## 2016-07-21 MED ORDER — HALOPERIDOL LACTATE 5 MG/ML IJ SOLN
5.0000 mg | Freq: Once | INTRAMUSCULAR | Status: DC
  Filled 2016-07-21: qty 1

## 2016-07-21 MED ORDER — ONDANSETRON HCL 4 MG PO TABS: 4.0000 mg | ORAL_TABLET | Freq: Three times a day (TID) | ORAL | Status: DC | PRN

## 2016-07-21 MED ORDER — ALUM & MAG HYDROXIDE-SIMETH 200-200-20 MG/5ML PO SUSP: 30.0000 mL | Freq: Four times a day (QID) | ORAL | Status: DC | PRN

## 2016-07-21 MED ORDER — ONDANSETRON 4 MG PO TBDP
4.0000 mg | ORAL_TABLET | Freq: Once | ORAL | Status: AC
  Administered 2016-07-21: 4 mg via ORAL
  Filled 2016-07-21: qty 1

## 2016-07-21 MED ORDER — KETOROLAC TROMETHAMINE 60 MG/2ML IM SOLN
30.0000 mg | Freq: Once | INTRAMUSCULAR | Status: AC
  Administered 2016-07-21: 30 mg via INTRAMUSCULAR
  Filled 2016-07-21: qty 2

## 2016-07-21 MED ORDER — DIPHENHYDRAMINE HCL 25 MG PO CAPS
25.0000 mg | ORAL_CAPSULE | Freq: Once | ORAL | Status: AC
  Administered 2016-07-21: 25 mg via ORAL
  Filled 2016-07-21: qty 1

## 2016-07-21 NOTE — ED Notes (Signed)
Security notified
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
## 2016-07-21 NOTE — ED Notes (Signed)
IVC papers complete and faxed to magistrate.

## 2016-07-21 NOTE — ED Notes (Signed)
Patient complaining of headache and asthma. Lungs clear.  Denies blurry vision, photo sensitivity, nausea, or vomiting.

## 2016-07-21 NOTE — ED Notes (Signed)
Pt resting at this time.  Patient seems to be more calm and cooperative.

## 2016-07-21 NOTE — ED Notes (Signed)
Pt has assigned room in ED, keeps walking to desk asking to call his cousin and speak to security about "shelter situation". Security present now, talking to patient. Pt states he wants to put his clothes back on and just wait for his cousin to pick him up. Dr. Effie ShyWentz notified, aware of situation. Pt stating, "i'm not that short of breath anyway, i'm just gonna go home". Left without being seen, calmly with security and all belongings.

## 2016-07-21 NOTE — ED Notes (Signed)
Original affidavit and Exam & Recommendation placed in red folder in POD F.

## 2016-07-21 NOTE — ED Notes (Signed)
EDP at bedside  

## 2016-07-21 NOTE — ED Triage Notes (Signed)
Pt was roomed in Pod E earlier to day for stating he needed a breathing tx and would like tx for a migraine.  However, he left before MD could assess him stating, "I don't know why I left.  Was meeting my cousin, but he gets off work late, so I decided to check in again".  Appears in no distress.

## 2016-07-21 NOTE — ED Notes (Signed)
Pt aware urine specimen needed. States he is unable to urinate at this time.

## 2016-07-21 NOTE — ED Notes (Signed)
Staffing stated that patient will have a sitter assigned at 11pm.
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
## 2016-07-21 NOTE — ED Triage Notes (Signed)
[facility] by EMS and pt sent to triage upon arrival. Pt reports SOB and states needs his inhaler refilled. EMS reports no labored breathing, no pain, breath sounds clear. 98% on room air.

## 2016-07-21 NOTE — ED Notes (Signed)
Patient refused blood work and urine specimen.

## 2016-07-21 NOTE — ED Notes (Signed)
IVC papers given to EDP for her to fill them out to be sent to magistrate.

## 2016-07-21 NOTE — ED Notes (Signed)
Patient still refusing blood work.  Calmer at this time, but said he didn't want it done at this.  States "maybe next shift".

## 2016-07-21 NOTE — ED Notes (Addendum)
[employer] here to serve IVC papers, security aware.

## 2016-07-21 NOTE — ED Notes (Signed)
Security and [employer] at bedside. 

## 2016-07-21 NOTE — ED Notes (Signed)
Pt given Malawiturkey sandwich, applesauce, graham crackers, peanut butter, and two sprites.

## 2016-07-22 LAB — CBC
HCT: 48.5 % (ref 39.0–52.0)
Hemoglobin: 15.3 g/dL (ref 13.0–17.0)
MCH: 28.2 pg (ref 26.0–34.0)
MCHC: 31.5 g/dL (ref 30.0–36.0)
MCV: 89.3 fL (ref 78.0–100.0)
PLATELETS: 269 10*3/uL (ref 150–400)
RBC: 5.43 MIL/uL (ref 4.22–5.81)
RDW: 14.6 % (ref 11.5–15.5)
WBC: 6.9 10*3/uL (ref 4.0–10.5)

## 2016-07-22 LAB — COMPREHENSIVE METABOLIC PANEL
ALT: 17 U/L (ref 17–63)
AST: 19 U/L (ref 15–41)
Albumin: 4.3 g/dL (ref 3.5–5.0)
Alkaline Phosphatase: 65 U/L (ref 38–126)
Anion gap: 10 (ref 5–15)
BILIRUBIN TOTAL: 0.7 mg/dL (ref 0.3–1.2)
BUN: 12 mg/dL (ref 6–20)
CHLORIDE: 101 mmol/L (ref 101–111)
CO2: 28 mmol/L (ref 22–32)
CREATININE: 1.08 mg/dL (ref 0.61–1.24)
Calcium: 9.2 mg/dL (ref 8.9–10.3)
Glucose, Bld: 87 mg/dL (ref 65–99)
POTASSIUM: 3.5 mmol/L (ref 3.5–5.1)
Sodium: 139 mmol/L (ref 135–145)
TOTAL PROTEIN: 6.9 g/dL (ref 6.5–8.1)

## 2016-07-22 LAB — ETHANOL

## 2016-07-22 LAB — SALICYLATE LEVEL

## 2016-07-22 LAB — ACETAMINOPHEN LEVEL: Acetaminophen (Tylenol), Serum: <10 ug/mL — ABNORMAL LOW (ref 10–30)

## 2016-07-22 NOTE — Progress Notes (Signed)
Patient meets criteria for inpatient treatment. CSW faxed referrals to the following inpatient facilities for review:  [facility], [facility], [facility], [facility], [facility], [address], [facility], [facility], [facility]         TTS will continue to seek bed placement.   Baldo DaubJolan Atul Delucia MSW, LCSWA CSW Disposition [phone]

## 2016-07-22 NOTE — ED Notes (Signed)
Report given to DazeyNicole in pod F.  Pt IVC paperwork taken to podF and pt with sitter moved to pod F ambulatory.  Meal tray also taken with pt.  Pt is calm and cooperative at this time

## 2016-07-22 NOTE — Progress Notes (Signed)
Other TTS machine currently in use will have wait start TTS Dmani Mizer K. Sherlon HandingHarris, LCAS-A, LPC-A, [facility]  Counselor 07/22/2016 12:13 AM
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
## 2016-07-22 NOTE — ED Notes (Addendum)
Pt denies SI/HI or hallucinations at this time. When asked if pt knew why he was in the hospital, pt responded "I don't know." Pt watching tv, calm and cooperative in room with sitter at bedside at this time.

## 2016-07-22 NOTE — ED Notes (Signed)
TTS in progress at bedside.   

## 2016-07-22 NOTE — Progress Notes (Signed)
Per Donell SievertSpencer, Simon NP meets inpatient criteria Elsie LincolnShean K. Sherlon HandingHarris, LCAS-A, LPC-A, [facility]  Counselor 07/22/2016 1:36 AM

## 2016-07-23 NOTE — ED Notes (Signed)
Pt requested graham crackers, peanut butter and coke for snack. Pt given the same with decaf coke and a cup of ice.

## 2016-07-23 NOTE — ED Notes (Signed)
Pt requesting ginger ale, informed pt of snack time hours. This tech told sitter she would ask RN just to check. Spoke to [employer] Bonnie and she said pt could have a ginger ale at this time. Pt given the same.

## 2016-07-23 NOTE — ED Notes (Addendum)
Breakfast tray order given to secretary. 

## 2016-07-23 NOTE — BH Assessment (Signed)
[facility] Assessment Progress Note  Pt reassessed this afternoon. When asked how he's feeling, pt said, "alright, a little bit". When asked to expound on what he meant, pt just repeated the same sentiment. When asked if he knew why he was in the hospital, pt said yes, then said no, then said "I was talking about killing people". Clinician asked pt if he actually felt like killing people. Pt never directly answered the question, but started saying how he didn't like [facility] (he says he's been here 7 days), how people are "disrespectful" and "hating on me" because he's a "new resident" and "different". Pt could not elaborate on any of the claims he was making and the assessment began to go nowhere as pt began ranting about people in "[place]" being broke and trying to steal from him and how he's worked everywhere in [place]", how he has 3 jobs. He also kept referencing "income taxes", but not linking the statement to anything else.   IP will continue to be recommended for pt.   Johny ShockSamantha M. Ladona Ridgelaylor, MS, NCC, LPCA Counselor

## 2016-07-23 NOTE — ED Notes (Signed)
Called [facility] shelter x 5 - per patient request - to notify that patient is in the hospital. No answer. Unable to leave a message.

## 2016-07-23 NOTE — ED Notes (Signed)
Patient was given a snack and Drink, and A Regular Diet was ordered for Lunch. 

## 2016-07-23 NOTE — ED Notes (Signed)
Dinner was ordered, and Patient was given Cookies for snack.

## 2016-07-23 NOTE — ED Notes (Signed)
TTS in progress at this time.  

## 2016-07-24 ENCOUNTER — Inpatient Hospital Stay (HOSPITAL_COMMUNITY)
Admission: AD | Admit: 2016-07-24 | Discharge: 2016-07-28 | DRG: 897 | Disposition: A | Discharge status: Home / Self Care | Payer: Federal, State, Local not specified - Other | Attending: Emergency Medicine | Admitting: Emergency Medicine

## 2016-07-24 ENCOUNTER — Encounter (HOSPITAL_COMMUNITY): Payer: Self-pay | Admitting: *Deleted

## 2016-07-24 DIAGNOSIS — T43505A Adverse effect of unspecified antipsychotics and neuroleptics, initial encounter: Secondary | ICD-10-CM

## 2016-07-24 DIAGNOSIS — Z590 Homelessness: Secondary | ICD-10-CM | POA: Diagnosis not present

## 2016-07-24 DIAGNOSIS — R45851 Suicidal ideations: Secondary | ICD-10-CM | POA: Diagnosis present

## 2016-07-24 DIAGNOSIS — R002 Palpitations: Secondary | ICD-10-CM | POA: Diagnosis present

## 2016-07-24 DIAGNOSIS — F14159 Cocaine abuse with cocaine-induced psychotic disorder, unspecified: Secondary | ICD-10-CM | POA: Diagnosis not present

## 2016-07-24 DIAGNOSIS — J45909 Unspecified asthma, uncomplicated: Secondary | ICD-10-CM | POA: Diagnosis present

## 2016-07-24 DIAGNOSIS — G4700 Insomnia, unspecified: Secondary | ICD-10-CM | POA: Diagnosis present

## 2016-07-24 DIAGNOSIS — T887XXA Unspecified adverse effect of drug or medicament, initial encounter: Secondary | ICD-10-CM

## 2016-07-24 DIAGNOSIS — G2402 Drug induced acute dystonia: Secondary | ICD-10-CM | POA: Clinically undetermined

## 2016-07-24 DIAGNOSIS — F411 Generalized anxiety disorder: Secondary | ICD-10-CM | POA: Diagnosis present

## 2016-07-24 DIAGNOSIS — F1420 Cocaine dependence, uncomplicated: Secondary | ICD-10-CM | POA: Clinically undetermined

## 2016-07-24 DIAGNOSIS — F17210 Nicotine dependence, cigarettes, uncomplicated: Secondary | ICD-10-CM | POA: Diagnosis present

## 2016-07-24 DIAGNOSIS — Z79899 Other long term (current) drug therapy: Secondary | ICD-10-CM | POA: Diagnosis not present

## 2016-07-24 DIAGNOSIS — G249 Dystonia, unspecified: Secondary | ICD-10-CM

## 2016-07-24 DIAGNOSIS — F29 Unspecified psychosis not due to a substance or known physiological condition: Secondary | ICD-10-CM | POA: Diagnosis present

## 2016-07-24 DIAGNOSIS — F14259 Cocaine dependence with cocaine-induced psychotic disorder, unspecified: Secondary | ICD-10-CM | POA: Clinically undetermined

## 2016-07-24 MED ORDER — HALOPERIDOL LACTATE 5 MG/ML IJ SOLN: 10.0000 mg | Freq: Two times a day (BID) | INTRAMUSCULAR | Status: DC

## 2016-07-24 MED ORDER — MAGNESIUM HYDROXIDE 400 MG/5ML PO SUSP: 30.0000 mL | Freq: Every day | ORAL | Status: DC | PRN

## 2016-07-24 MED ORDER — ACETAMINOPHEN 325 MG PO TABS
650.0000 mg | ORAL_TABLET | Freq: Four times a day (QID) | ORAL | Status: DC | PRN
Start: 2016-07-24 — End: 2016-07-28

## 2016-07-24 MED ORDER — DIPHENHYDRAMINE HCL 25 MG PO CAPS
50.0000 mg | ORAL_CAPSULE | Freq: Four times a day (QID) | ORAL | Status: DC | PRN
  Administered 2016-07-27: 50 mg via ORAL
  Filled 2016-07-24 (×2): qty 2

## 2016-07-24 MED ORDER — DIPHENHYDRAMINE HCL 25 MG PO CAPS
50.0000 mg | ORAL_CAPSULE | Freq: Four times a day (QID) | ORAL | Status: DC | PRN
  Administered 2016-07-24: 50 mg via ORAL
  Filled 2016-07-24: qty 2

## 2016-07-24 MED ORDER — HALOPERIDOL 5 MG PO TABS
10.0000 mg | ORAL_TABLET | Freq: Two times a day (BID) | ORAL | Status: DC
  Administered 2016-07-24: 10 mg via ORAL
  Filled 2016-07-24 (×9): qty 2

## 2016-07-24 MED ORDER — HALOPERIDOL LACTATE 5 MG/ML IJ SOLN
10.0000 mg | Freq: Two times a day (BID) | INTRAMUSCULAR | Status: DC
  Filled 2016-07-24 (×8): qty 2

## 2016-07-24 MED ORDER — TRAZODONE HCL 50 MG PO TABS
50.0000 mg | ORAL_TABLET | Freq: Every evening | ORAL | Status: DC | PRN
  Administered 2016-07-24: 50 mg via ORAL
  Filled 2016-07-24: qty 1
  Filled 2016-07-24: qty 7

## 2016-07-24 MED ORDER — HALOPERIDOL 5 MG PO TABS
10.0000 mg | ORAL_TABLET | Freq: Two times a day (BID) | ORAL | Status: DC
Start: 2016-07-24 — End: 2016-07-24
  Administered 2016-07-24: 10 mg via ORAL
  Filled 2016-07-24: qty 2

## 2016-07-24 MED ORDER — HYDROXYZINE HCL 25 MG PO TABS
25.0000 mg | ORAL_TABLET | Freq: Three times a day (TID) | ORAL | Status: DC | PRN
  Filled 2016-07-24: qty 9

## 2016-07-24 MED ORDER — ALUM & MAG HYDROXIDE-SIMETH 200-200-20 MG/5ML PO SUSP: 30.0000 mL | ORAL | Status: DC | PRN

## 2016-07-24 MED ORDER — DIPHENHYDRAMINE HCL 50 MG/ML IJ SOLN
50.0000 mg | Freq: Four times a day (QID) | INTRAMUSCULAR | Status: DC | PRN
  Administered 2016-07-26: 50 mg via INTRAMUSCULAR
  Filled 2016-07-24 (×2): qty 1

## 2016-07-24 MED ORDER — DIPHENHYDRAMINE HCL 50 MG/ML IJ SOLN
50.0000 mg | Freq: Four times a day (QID) | INTRAMUSCULAR | Status: DC | PRN
Start: 2016-07-24 — End: 2016-07-24

## 2016-07-24 NOTE — ED Triage Notes (Signed)
Pt ordered 2 meal trays this morning . Pt ate first meal but refused 2nd meal tray .

## 2016-07-24 NOTE — Tx Team (Signed)
Initial Treatment Plan 07/24/2016 5:52 PM Dustin Norris [place] [id number]    PATIENT STRESSORS: Other: Psychosis   PATIENT STRENGTHS: Average or above average intelligence Communication skills General fund of knowledge Supportive family/friends   PATIENT IDENTIFIED PROBLEMS: Psychosis  "Getting SSI Benefits"  "Going back home to [place]"                 DISCHARGE CRITERIA:  Ability to meet basic life and health needs Adequate post-discharge living arrangements Improved stabilization in mood, thinking, and/or behavior Motivation to continue treatment in a less acute level of care Need for constant or close observation no longer present Verbal commitment to aftercare and medication compliance  PRELIMINARY DISCHARGE PLAN: Outpatient therapy Placement in alternative living arrangements  PATIENT/FAMILY INVOLVEMENT: This treatment plan has been presented to and reviewed with the patient, Dustin Norris [place].  The patient and family have been given the opportunity to ask questions and make suggestions.  Carleene OverlieMiddleton, Franceska Strahm P, RN 07/24/2016, 5:52 PM
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
## 2016-07-24 NOTE — ED Notes (Signed)
TTS consult in  Progress.

## 2016-07-24 NOTE — ED Provider Notes (Signed)
Accept[facility] by Dr. Elna BreslowEappen. Records reviewed. Vitals stable. Blood work reassuring. Medically cleared for transfer for ongoing psychiatric treatment.   Lavera GuiseLiu, Caddie Randle Duo, MD 07/24/16 [id number]

## 2016-07-24 NOTE — ED Triage Notes (Signed)
TTS done this AM 

## 2016-07-24 NOTE — ED Notes (Signed)
Declined W/C at D/C and was escorted to lobby by RN. 

## 2016-07-24 NOTE — B[facility] Counselor (Signed)
Pt was re-assessed this AM.  He reported that he "felt OK" and that "I'm ready to go."  When asked why he was in the hospital, Pt responded, "I don't know, but I'm ready to go."  Pt appeared evasive in responses, given that he was able to articulate why he was in the hospital yesterday (on 07/23/16, he told TTS he was in the hospital for threatening to kill people."  Author asked Pt's plan if discharged.  "I stay at a shelter here, but I'm ready to get on [employer] and go to [place]."  Pt denied suicidal ideation, homicidal ideation, and AVH.

## 2016-07-24 NOTE — Progress Notes (Signed)
Adult Psychoeducational Group Note  Date:  07/24/2016 Time:  10:43 PM  Group Topic/Focus:  Wrap-Up Group:   The focus of this group is to help patients review their daily goal of treatment and discuss progress on daily workbooks.  Participation Level:  Did Not Attend  Participation Quality:  Did not attend  Affect:  Did not attend  Cognitive:  Did not attend  Insight: None  Engagement in Group:  Did not attend  Modes of Intervention:  Did not attend  Additional Comments:  Patient did not attend wrap up group tonight.  Johann Gascoigne L Carmelina Balducci 07/24/2016, 10:43 PM

## 2016-07-25 NOTE — Progress Notes (Signed)
Patient ID: Dustin Norris, male   DOB: 01/31/1976, 41 y.o.   MRN: [id number]   Pt was in the bed most of the morning, he woke up and went to lunch. While at lunch he started to complain of SOB and Chest Pain. Pt was assessed by nurse and NP, orders noted to send patient to the ER. Pt reported that he had never felt this way before and that he was going to sue everybody for trying to kill him. Pt was suppose to get Haldol this morning, however he refused. Pt reported that we were trying to kill him with medication. Pts vitals signs were BP130/108, HR 62, O2 Sat 100% on RA, Resp 20. Pt appeared to be in distress , per NP orders pt was put on 2L of O2 with a O2 Sat of 98%.

## 2016-07-25 NOTE — B[facility] Suicide Risk Assessment (Signed)
[facility] INPATIENT:  Family/Significant Other Suicide Prevention Education  Suicide Prevention Education:  Patient Refusal for Family/Significant Other Suicide Prevention Education: The patient Dustin Norris has refused to provide written consent for family/significant other to be provided Family/Significant Other Suicide Prevention Education during admission and/or prior to discharge.  Physician notified.  Writer provided suicide prevention education directly to patient; conversation included risk factors, warning signs and resources to contact for help. Mobile crisis services explained and  explanations given as to resources which will be included on patient's discharge paperwork. Conversation occurred following PSA before lunch.    Carney BernCatherine C Harrill 07/25/2016, 2:21 PM

## 2016-07-25 NOTE — ED Provider Notes (Signed)
[facility]-EMER[facility]ENCY DEPT Provider Note   CSN: [id number] Arrival date & time: 07/25/16  1351     History   Chief Complaint Chief Complaint  Patient presents with  . Chest Pain    HPI Dustin Norris is a 41 y.o. male.  HPI   Didn't eat breakfast, didn't eat lunch but when he got to lunch room just prior to lunch developed palpitations.  Patient reports palpitations, feeling like his heart was beating out of his chest. Reports he felt like his lips and his tongue were swelling, and like he couldn't breathe. Reports his tongue was hanging out of his mouth. Reports he believes that the doctors and the man last night who gave him his medications are trying to kill him. Reports that he thinks it's the medications that have caused these symptoms. He did receive 10mg  of haldol last night and per nursing report, the not have any breakfast or lunch today.  Patient reports that he received Benadryl with EMS en route, and at this time feels back to normal. Denies any continuing swelling. Denies ever having itching, rash, nausea, vomiting, diarrhea. Reports he just had symptoms of palpitations and like his throat was swollen and believes it was caused by the medications he was giving "because someone is trying to kill me."    Past Medical History:  Diagnosis Date  . Anxiety   . Asthma   . Cocaine abuse   . Homelessness   . Migraine   . Tobacco abuse     Patient Active Problem List   Diagnosis Date Noted  . Psychosis 07/24/2016  . Chest pain 11/23/2015  . Asthma 11/23/2015  . Tobacco abuse 11/23/2015  . Elevated lactic acid level 11/23/2015  . Cocaine abuse   . Concern about STD in male without diagnosis   . Paranoid ideation (HCC) 02/22/2015    History reviewed. No pertinent surgical history.     Home Medications    Prior to Admission medications   Medication Sig Start Date End Date Taking? Authorizing Provider  acetaminophen (TYLENOL) 325 MG tablet Take 2 tablets (650 mg  total) by mouth every 6 (six) hours as needed. Patient not taking: Reported on 04/09/2016 03/23/16   Barrett Henle, PA-C  acetaminophen (TYLENOL) 500 MG tablet Take 500 mg by mouth every 6 (six) hours as needed.    [provider]  albuterol (PROVENTIL HFA;VENTOLIN HFA) 108 (90 Base) MCG/ACT inhaler Inhale 2 puffs into the lungs every 4 (four) hours as needed for wheezing or shortness of breath. Patient not taking: Reported on 03/22/2016 08/15/15   Jerelyn Scott, MD  albuterol (PROVENTIL HFA;VENTOLIN HFA) 108 (90 Base) MCG/ACT inhaler Inhale 1-2 puffs into the lungs every 6 (six) hours as needed for wheezing or shortness of breath. Patient not taking: Reported on 04/09/2016 03/21/16   Arby Barrette, MD  butalbital-acetaminophen-caffeine (FIORICET, ESGIC) [phone] MG tablet Take 1-2 tablets by mouth every 6 (six) hours as needed for headache. Patient not taking: Reported on 04/09/2016 04/02/16 04/02/17  Fayrene Helper, PA-C    Family History History reviewed. No pertinent family history.  Social History Social History  Substance Use Topics  . Smoking status: Current Every Day Smoker    Packs/day: 1.00    Years: 0.00    Types: Cigarettes, Cigars  . Smokeless tobacco: Never Used  . Alcohol use No     Allergies   Patient has no known allergies.   Review of Systems Review of Systems  Constitutional: Negative for fever.  HENT: Positive  for trouble swallowing. Negative for sore throat.   Eyes: Negative for visual disturbance.  Respiratory: Negative for shortness of breath.   Cardiovascular: Positive for chest pain (but describes it more like "my heart was beating out of my chest") and palpitations.  Gastrointestinal: Negative for abdominal pain, nausea and vomiting.  Genitourinary: Negative for difficulty urinating.  Musculoskeletal: Negative for back pain and neck stiffness.  Skin: Negative for rash.  Neurological: Negative for syncope and headaches.    Psychiatric/Behavioral: Positive for agitation. The patient is nervous/anxious.      Physical Exam Updated Vital Signs BP 115/62   Pulse (!) 58   Temp 98.2 F (36.8 C) (Oral)   Resp 14   Ht 5\' 7"  (1.702 m)   Wt 68 kg (150 lb)   SpO2 95%   BMI 23.49 kg/m   Physical Exam  Constitutional: He is oriented to person, place, and time. He appears well-developed and well-nourished. No distress.  HENT:  Head: Normocephalic and atraumatic.  No tongue or pharyngeal swelling  Eyes: Conjunctivae and EOM are normal.  Neck: Normal range of motion.  Cardiovascular: Normal rate, regular rhythm, normal heart sounds and intact distal pulses.  Exam reveals no gallop and no friction rub.   No murmur heard. Pulmonary/Chest: Effort normal and breath sounds normal. No respiratory distress. He has no wheezes. He has no rales.  Abdominal: Soft. He exhibits no distension. There is no tenderness. There is no guarding.  Musculoskeletal: He exhibits no edema.  Neurological: He is alert and oriented to person, place, and time.  Skin: Skin is warm and dry. He is not diaphoretic.  Nursing note and vitals reviewed.    ED Treatments / Results  Labs (all labs ordered are listed, but only abnormal results are displayed) Labs Reviewed - No data to display  EKG  EKG Interpretation  Date/Time:  Sunday July 25 2016 14:33:28 EDT Ventricular Rate:  57 PR Interval:  138 QRS Duration: 78 QT Interval:  400 QTC Calculation: 389 R Axis:   80 Text Interpretation:  Sinus bradycardia RSR' or QR pattern in V1 suggests right ventricular conduction delay Cannot rule out Anterior infarct , age undetermined Abnormal ECG Since prior ECG, patient heart rate has decreased Confirmed by Alvira Monday ([id number]) on 07/25/2016 5:34:32 PM       Radiology No results found.  Procedures Procedures (including critical care time)  Medications Ordered in ED Medications  acetaminophen (TYLENOL) tablet 650 mg (not  administered)  alum & mag hydroxide-simeth (MAALOX/MYLANTA) 200-200-20 MG/5ML suspension 30 mL (not administered)  hydrOXYzine (ATARAX/VISTARIL) tablet 25 mg (not administered)  magnesium hydroxide (MILK OF MAGNESIA) suspension 30 mL (not administered)  traZODone (DESYREL) tablet 50 mg (50 mg Oral Given 07/24/16 2357)  diphenhydrAMINE (BENADRYL) capsule 50 mg (not administered)    Or  diphenhydrAMINE (BENADRYL) injection 50 mg (not administered)  haloperidol (HALDOL) tablet 10 mg (10 mg Oral Not Given 07/25/16 1728)    Or  haloperidol lactate (HALDOL) injection 10 mg ( Intramuscular See Alternative 07/25/16 1728)     Initial Impression / Assessment and Plan / ED Course  I have reviewed the triage vital signs and the nursing notes.  Pertinent labs & imaging results that were available during my care of the patient were reviewed by me and considered in my medical decision making (see chart for details).    41 year old male with a history of anxiety, substance abuse, paranoid ideations, currently under [facility] at [facility] age for psychosis, presents from [facility] with concern  for palpitations. My history, patient does not describe chest pain is much as he describes a sensation that his heart is about to burst coming coming out of his chest, with heart racing and pounding and reports associated sensation of throat closing which resolved with Benadryl in route. Patient does not have any signs of anaphylaxis on my exam, and given his symptoms completely resolution with Benadryl, have low suspicion for anaphylaxis. EKG does not show any sign of prolonged QTc, or other signs of arrhythmia, nor does it show acute ST changes. Given history of acute palpitations with other associated symptoms that resolved with Benadryl, have low suspicion this represents ACS, pulmonary embolus, aortic dissection. Symptoms may represent anxiety, or potential side effect of medication.  Patient [facility]  care.    Final Clinical Impressions(s) / ED Diagnoses   Final diagnoses:  Palpitations  Medication side effect    New Prescriptions Current Discharge Medication List       Alvira MondaySchlossman, Reona Zendejas, MD 07/25/16 [phone]

## 2016-07-25 NOTE — [facility] Group Notes (Signed)
[facility] Group Notes: (Clinical Social Work)   07/25/2016      Type of Therapy:  Group Therapy   Participation Level:  Did Not Attend despite MHT prompting   Ambrose MantleMareida Grossman-Orr, LCSW 07/25/2016, 12:54 PM
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
## 2016-07-25 NOTE — Progress Notes (Signed)
Patient ID: Dustin Norris, male   DOB: November 29, 1975, 41 y.o.   MRN: [id number]   D: [facility] he reported that he felt better. Pt was given dinner and was able to eat 2 meals. Pt refused his evening Haldol, he reported that he was going to take nothing that was going to kill him. Pt reported that his depression was a 10, his hopelessness was a 10, and his anxiety was a 0. Pt reported being negative SI/HI, no AH/VH noted. A: 15 min checks continued for patient safety. R: Pt safety maintained.

## 2016-07-25 NOTE — BHH Counselor (Signed)
Adult Comprehensive Assessment  Patient ID: Dustin Norris, male   DOB: 06/10/1975, [age] [address]   MRN: [id number]  Information Source: Information source: Patient  Current Stressors:  Educational / Learning stressors: NA Employment / Job issues: NA Family Relationships: NA Surveyor, quantityinancial / Lack of resources (include bankruptcy): Strained Housing / Lack of housing: Manufacturing systems engineerCannot afford consistent housing; [facility] Physical health (include injuries & life threatening diseases): Migraines and asthma Social relationships: Not many supports Substance abuse: Pt denies Bereavement / Loss: NA  Living/Environment/Situation:  Living Arrangements:  ([facility]) Living conditions (as described by patient or guardian): [facility] for last 7 days since return to [place] from visit with aunt in [place]; pt reports desire to stay in [place] where he has lived 6 years previously with his cousin How long has patient lived in current situation?: 1 week What is atmosphere in current home: Temporary  Family History:  Marital status: Single Are you sexually active?:  (Pt would neither confirm nor deny) What is your sexual orientation?: Unknown Has your sexual activity been affected by drugs, alcohol, medication, or emotional stress?: Unknown Does patient have children?: No  Childhood History:  By whom was/is the patient raised?: Other (Comment) Additional childhood history information: Patient vague about who he grew up with "other relatives, here and there, mostly there" Description of patient's relationship with caregiver when they were a child: "Good" Patient's description of current relationship with people who raised him/her: "Good" How were you disciplined when you got in trouble as a child/adolescent?: "regular punishments" Pt refused to elaborate Does patient have siblings?: Yes Number of Siblings: 1 Description of patient's current relationship with  siblings: Good with brother Did patient suffer any verbal/emotional/physical/sexual abuse as a child?: No Did patient suffer from severe childhood neglect?: No Has patient ever been sexually abused/assaulted/raped as an adolescent or adult?: No Was the patient ever a victim of a crime or a disaster?: No Witnessed domestic violence?: No Has patient been effected by domestic violence as an adult?: No  Education:  Highest grade of school patient has completed: 12 Currently a student?: No Learning disability?: No  Employment/Work Situation:   Employment situation: Employed 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, [employer] [place] How long has patient been employed?: 1 week Patient's job has been impacted by current illness: No What is the longest time patient has a held a job?: 2 years Where was the patient employed at that time?: "another [employer]" Has patient ever been in the [employer] and Companymilitary?: No Are There Guns or Other Weapons in Your Home?: No  Financial Resources:   Financial resources: Income from employment, Food stamps Does patient have a representative payee or guardian?: No  Alcohol/Substance Abuse:   What has been your use of drugs/alcohol within the last 12 months?: Pt denies use of any substances although UDS was positive for cocaine Alcohol/Substance Abuse Treatment Hx: Denies past history Has alcohol/substance abuse ever caused legal problems?: No  Social Support System:   Patient's Community Support System: Fair Development worker, communityDescribe Community Support System: "my cousin here. aunt in [place] and brother and I'm not telling you where he is" Type of faith/religion: Denies any affiliation How does patient's faith help to cope with current illness?: NA  Leisure/Recreation:   Leisure and Hobbies: "Nothing"  Strengths/Needs:   What things does the patient do well?: "Everything" In what areas does patient struggle / problems for patient: Stable finances and Housing  Discharge Plan:    Does  patient have access to transportation?: No Plan for no access to transportation at discharge: Bus pass Will patient be returning to same living situation after discharge?: Yes Currently receiving community mental health services: No If no, would patient like referral for services when discharged?:  (Denies desire for follow up at this time (11:30 am 07/25/16)) Does patient have financial barriers related to discharge medications?: Yes Patient description of barriers related to discharge medications: Patient is strained financially yet also is refusing med's per self report   Summary/Recommendations:   Summary and Recommendations (to be completed by the evaluator): Patient is 41 YO single employed male admitted IVC with Unspecified Psychosis. Stressors for patient include staying at [facility] for last week since he returned to [place] from family visit in [place] (patient lived with cousin before visit to [place] but states he cannot return there), missing work due to hospitalization due to recent period of psychosis and anger with homicidal ideation.  Pt reports no history of mental health concerns and is advocating for discharge. Patient will benefit from crisis stabilization, medication evaluation, group therapy and psycho education, in addition to case management for discharge planning. At discharge it is recommended that patient adhere to the established discharge plan and continue in treatment.  Carney Bern. 07/25/2016

## 2016-07-25 NOTE — ED Notes (Signed)
Pt c/o center chest pain, feeling like his heart was going to explode, swelling in throat and tongue while eating. Pt reports, "They trying to kill me. I don't understand how yesterday I came in with a migraine and ended up in behavioral health to almost dying today."

## 2016-07-25 NOTE — Progress Notes (Signed)
Pt at this time is in bed resting with eyes closed. Pt does not look to be in any distress at this time. Support, encouragement, and safe environment provided. 15-minute safety checks continue. Pt was med compliant. Pt refused to answer any question.

## 2016-07-25 NOTE — Discharge Instructions (Addendum)
Avoid Haldol--Possible dystonia.

## 2016-07-26 ENCOUNTER — Encounter (HOSPITAL_COMMUNITY): Payer: Self-pay | Admitting: *Deleted

## 2016-07-26 DIAGNOSIS — F14259 Cocaine dependence with cocaine-induced psychotic disorder, unspecified: Secondary | ICD-10-CM | POA: Clinically undetermined

## 2016-07-26 DIAGNOSIS — F14250 Cocaine dependence with cocaine-induced psychotic disorder with delusions: Secondary | ICD-10-CM

## 2016-07-26 DIAGNOSIS — F1420 Cocaine dependence, uncomplicated: Secondary | ICD-10-CM | POA: Clinically undetermined

## 2016-07-26 MED ORDER — DIPHENHYDRAMINE HCL 25 MG PO CAPS
25.0000 mg | ORAL_CAPSULE | Freq: Once | ORAL | Status: AC
  Administered 2016-07-26: 25 mg via ORAL
  Filled 2016-07-26: qty 1

## 2016-07-26 MED ORDER — LORAZEPAM 2 MG/ML IJ SOLN
INTRAMUSCULAR | Status: AC
  Administered 2016-07-26: 14:00:00
  Filled 2016-07-26: qty 1

## 2016-07-26 NOTE — Progress Notes (Signed)
Patient denies SI, HI and AVH.  Patient was noted to have a swollen tongue with his neck twisted to one side.  Patient complained of being unable to breath.  Physician was contacted and patient was transferred to the emergency room. Patient returned to the unit with reaction resolved. It was recommended that patient be given benadryl Q6hrs for 24 hours.   Assess patient for safety, offer medications as prescribed, engage patient in 1:1 staff talks, monitor for side effects.   Continue to monitor as planned.

## 2016-07-26 NOTE — Progress Notes (Signed)
Pt at the time of was in bed resting with eyes closed. Pt denied all and refused any med; "you guys are trying to kill me." Pt does not look to be in any distress at this time. Support, encouragement, and safe environment provided. 15-minute safety checks continue.

## 2016-07-26 NOTE — Tx Team (Signed)
Interdisciplinary Treatment and Diagnostic Plan Update  07/26/2016 Time of Session: 8:47 AM  Dustin Norris MRN: [id number]  Principal Diagnosis: Psychosis  Secondary Diagnoses: Principal Problem:   Psychosis   Current Medications:  Current Facility-Administered Medications  Medication Dose Route Frequency Provider Last Rate Last Dose  . acetaminophen (TYLENOL) tablet 650 mg  650 mg Oral Q6H PRN Ethelene Hal, NP      . alum & mag hydroxide-simeth (MAALOX/MYLANTA) 200-200-20 MG/5ML suspension 30 mL  30 mL Oral Q4H PRN Ethelene Hal, NP      . diphenhydrAMINE (BENADRYL) capsule 50 mg  50 mg Oral Q6H PRN Ethelene Hal, NP       Or  . diphenhydrAMINE (BENADRYL) injection 50 mg  50 mg Intramuscular Q6H PRN Ethelene Hal, NP      . haloperidol (HALDOL) tablet 10 mg  10 mg Oral BID Ethelene Hal, NP   10 mg at 07/24/16 2356   Or  . haloperidol lactate (HALDOL) injection 10 mg  10 mg Intramuscular BID Ethelene Hal, NP      . hydrOXYzine (ATARAX/VISTARIL) tablet 25 mg  25 mg Oral TID PRN Ethelene Hal, NP      . magnesium hydroxide (MILK OF MAGNESIA) suspension 30 mL  30 mL Oral Daily PRN Ethelene Hal, NP      . traZODone (DESYREL) tablet 50 mg  50 mg Oral QHS PRN Ethelene Hal, NP   50 mg at 07/24/16 2357    PTA Medications: Prescriptions Prior to Admission  Medication Sig Dispense Refill Last Dose  . acetaminophen (TYLENOL) 325 MG tablet Take 2 tablets (650 mg total) by mouth every 6 (six) hours as needed. (Patient not taking: Reported on 04/09/2016) 30 tablet 0 Not Taking at Unknown time  . acetaminophen (TYLENOL) 500 MG tablet Take 500 mg by mouth every 6 (six) hours as needed.   07/21/2016 at Unknown time  . albuterol (PROVENTIL HFA;VENTOLIN HFA) 108 (90 Base) MCG/ACT inhaler Inhale 2 puffs into the lungs every 4 (four) hours as needed for wheezing or shortness of breath. (Patient not taking: Reported on 03/22/2016)  1 Inhaler 3 Not Taking at Unknown time  . albuterol (PROVENTIL HFA;VENTOLIN HFA) 108 (90 Base) MCG/ACT inhaler Inhale 1-2 puffs into the lungs every 6 (six) hours as needed for wheezing or shortness of breath. (Patient not taking: Reported on 04/09/2016) 1 Inhaler 0 Not Taking at Unknown time  . butalbital-acetaminophen-caffeine (FIORICET, ESGIC) 50-325-40 MG tablet Take 1-2 tablets by mouth every 6 (six) hours as needed for headache. (Patient not taking: Reported on 04/09/2016) 20 tablet 0 Not Taking at Unknown time    Patient Stressors: Other: Psychosis  Patient Strengths: Average or above average intelligence Communication skills General fund of knowledge Supportive family/friends  Treatment Modalities: Medication Management, Group therapy, Case management,  1 to 1 session with clinician, Psychoeducation, Recreational therapy.   Physician Treatment Plan for Primary Diagnosis: Psychosis Long Term Goal(s): Improvement in symptoms so as ready for discharge  Short Term Goals: Ability to identify changes in lifestyle to reduce recurrence of condition will improve Ability to verbalize feelings will improve Ability to identify and develop effective coping behaviors will improve Compliance with prescribed medications will improve Ability to identify changes in lifestyle to reduce recurrence of condition will improve Ability to verbalize feelings will improve Compliance with prescribed medications will improve Ability to identify triggers associated with substance abuse/mental health issues will improve  Medication Management: Evaluate patient's response, side effects, and  tolerance of medication regimen.  Therapeutic Interventions: 1 to 1 sessions, Unit Group sessions and Medication administration.  Evaluation of Outcomes: Progressing  Physician Treatment Plan for Secondary Diagnosis: Principal Problem:   Psychosis   Long Term Goal(s): Improvement in symptoms so as ready for  discharge  Short Term Goals: Ability to identify changes in lifestyle to reduce recurrence of condition will improve Ability to verbalize feelings will improve Ability to identify and develop effective coping behaviors will improve Compliance with prescribed medications will improve Ability to identify changes in lifestyle to reduce recurrence of condition will improve Ability to verbalize feelings will improve Compliance with prescribed medications will improve Ability to identify triggers associated with substance abuse/mental health issues will improve  Medication Management: Evaluate patient's response, side effects, and tolerance of medication regimen.  Therapeutic Interventions: 1 to 1 sessions, Unit Group sessions and Medication administration.  Evaluation of Outcomes: Progressing   RN Treatment Plan for Primary Diagnosis: Psychosis Long Term Goal(s): Knowledge of disease and therapeutic regimen to maintain health will improve  Short Term Goals: Ability to remain free from injury will improve and Ability to identify and develop effective coping behaviors will improve  Medication Management: RN will administer medications as ordered by provider, will assess and evaluate patient's response and provide education to patient for prescribed medication. RN will report any adverse and/or side effects to prescribing provider.  Therapeutic Interventions: 1 on 1 counseling sessions, Psychoeducation, Medication administration, Evaluate responses to treatment, Monitor vital signs and CBGs as ordered, Perform/monitor CIWA, COWS, AIMS and Fall Risk screenings as ordered, Perform wound care treatments as ordered.  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[facility]) Long Term Goal(s): Patient will participate in recreation therapy treatment in at least 2 group sessions  without prompting from LRT  Short Term Goals: Patient will demonstrate increased ability to follow instructions, as demonstrated by ability to follow LRT instructions on first prompt during recreation therapy group sessions  Treatment Modalities: Group and Pet Therapy  Therapeutic Interventions: Psychoeducation  Evaluation of Outcomes: Progressing   LCSW Treatment Plan for Primary Diagnosis: Psychosis Long Term Goal(s): Safe transition to appropriate next level of care at discharge, Engage patient in therapeutic group addressing interpersonal concerns.  Short Term Goals: Engage patient in aftercare planning with referrals and resources  Therapeutic Interventions: Assess for all discharge needs, 1 to 1 time with Social worker, Explore available resources and support systems, Assess for adequacy in community support network, Educate family and significant other(s) on suicide prevention, Complete Psychosocial Assessment, Interpersonal group therapy.  Evaluation of Outcomes: Met  Return home, follow up [facility]   Progress in Treatment: Attending groups: No Participating in groups: No Taking medication as prescribed: Yes Toleration medication: Yes, no side effects reported at this time Family/Significant other contact made: No Patient understands diagnosis:No Limited insight Discussing patient identified problems/goals with staff: Yes Medical problems stabilized or resolved: Yes Denies suicidal/homicidal ideation: Yes Issues/concerns per patient self-inventory: None Other: N/A  New problem(s) identified:  New Short Term/Long Term Goal(s):  "I want to be discharged as soon as possible.  I have to get back to work."     Discharge Plan or Barriers:   Reason for Continuation of Hospitalization:  Medical Issues-EPS from haldol reaction Medication stabilization   Estimated Length of Stay: 5 days  Attendees: Patient: Dustin Norris 07/26/2016  8:47 AM  Physician: Ursula Alert, MD 07/26/2016  8:47 AM  Nursing: Sena Hitch, RN 07/26/2016  8:47 AM  RN Care Manager: Lars Pinks, RN 07/26/2016  8:47 AM  Social Worker: Ripley Fraise 07/26/2016  8:47 AM  Recreational Therapist: Victorino Sparrow, LRT/CTRS 07/26/2016  8:47 AM  Other: Norberto Sorenson 07/26/2016  8:47 AM  Other:  07/26/2016  8:47 AM    Scribe for Treatment Team:  Roque Lias LCSW 07/26/2016 8:47 AM

## 2016-07-26 NOTE — BHH Suicide Risk Assessment (Signed)
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 Cocaine use disorder, moderate, dependence (HCC) [F14.20] 07/26/2016  . 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 Psychosis [F29] 07/24/2016  . Chest pain [R07.9] 11/23/2015  . Asthma [J45.909] 11/23/2015  . Tobacco abuse [Z72.0] 11/23/2015  . Elevated lactic acid level [R79.89] 11/23/2015  . Cocaine abuse [F14.10]   . Concern about STD in male without diagnosis [Z71.1]   . Paranoid ideation (HCC) [F22] 02/22/2015   Subjective Data: Please see H&P as well as progress notes    Continued Clinical Symptoms:  Alcohol Use Disorder Identification Test Final Score (AUDIT): 0 The "Alcohol Use Disorders Identification Test", Guidelines for Use in Primary Care, Second Edition.  World Science writer [facility]). Score between 0-7:  no or low risk or alcohol related problems. Score between 8-15:  moderate risk of alcohol related problems. Score between 16-19:  high risk of alcohol related problems. Score 20 or above:  warrants further diagnostic evaluation for alcohol dependence and treatment.   CLINICAL FACTORS:   Alcohol/Substance Abuse/Dependencies   Musculoskeletal: Strength & Muscle Tone: within normal limits Gait & Station: normal Patient leans: N/A  Psychiatric Specialty Exam: Physical Exam  Review of Systems  Psychiatric/Behavioral: Positive for substance abuse. The patient is nervous/anxious.   All other systems reviewed and  are negative.   Blood pressure 109/77, pulse 74, temperature 97.9 F (36.6 C), temperature source Oral, resp. rate 18, height 5\' 7"  (1.702 m), weight 68 kg (150 lb), SpO2 95 %.Body mass index is 23.49 kg/m.  General Appearance: Casual  Eye Contact:  Fair  Speech:  Normal Rate  Volume:  Normal  Mood:  Anxious  Affect:  Congruent  Thought Process:  Goal Directed and Descriptions of Associations: Intact  Orientation:  Full (Time, Place, and Person)  Thought Content:  Logical  Suicidal Thoughts:  No  Homicidal Thoughts:  No  Memory:  Immediate;   Fair Recent;   Fair Remote;   Fair  Judgement:  Impaired  Insight:  Shallow  Psychomotor Activity:  Normal  Concentration:  Concentration: Fair and Attention Span: Fair  Recall:  [employer] of Knowledge:  Fair  Language:  Fair  Akathisia:  No  Handed:  Right  AIMS (if indicated):     Assets:  Desire for Improvement  ADL's:  Intact  Cognition:  WNL  Sleep:  Number of Hours: 6.72      COGNITIVE FEATURES THAT CONTRIBUTE TO RISK:  Closed-mindedness, Polarized thinking and Thought constriction (tunnel vision)    SUICIDE RISK:   Mild:  Suicidal ideation of limited frequency, intensity, duration, and specificity.  There are no identifiable plans, no associated intent, mild dysphoria and related symptoms, good self-control (both objective and subjective assessment), few other risk factors, and identifiable protective factors, including available and accessible social support.  PLAN OF CARE: pt made threatening comments like " I am going to shoot every one down, I hate [place] people.' Pt with polysubstance abuse, will observe on the unit.   I certify that inpatient services furnished can reasonably  be expected to improve the patient's condition.   Donnis Pecha, MD 07/26/2016, 1:05 PM

## 2016-07-26 NOTE — ED Provider Notes (Signed)
[facility]-EMERGENCY DEPT Provider Note   CSN: [id number] Arrival date & time: 07/25/16  1351     History   Chief Complaint Chief Complaint  Patient presents with  . Allergic Reaction    HPI Dustin Norris is a 41 y.o. male. CC:  "jaw locked up"  HPI:  41 y/o male, inpatient [facility], treated with I and PO haldol yesterday. Seen yesterday with anxiety, palpitations, and jaw tightness. Similar reaction today. Resolved. Given benadryl earlier today, but not en-route. No CP or palpitations.  Past Medical History:  Diagnosis Date  . Anxiety   . Asthma   . Cocaine abuse   . Homelessness   . Migraine   . Tobacco abuse     Patient Active Problem List   Diagnosis Date Noted  . Cocaine use disorder, moderate, dependence (HCC) 07/26/2016  . 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 Psychosis 07/24/2016  . Chest pain 11/23/2015  . Asthma 11/23/2015  . Tobacco abuse 11/23/2015  . Elevated lactic acid level 11/23/2015  . Cocaine abuse   . Concern about STD in male without diagnosis   . Paranoid ideation (HCC) 02/22/2015    History reviewed. No pertinent surgical history.     Home Medications    Prior to Admission medications   Medication Sig Start Date End Date Taking? Authorizing Provider  acetaminophen (TYLENOL) 325 MG tablet Take 2 tablets (650 mg total) by mouth every 6 (six) hours as needed. Patient not taking: Reported on 04/09/2016 03/23/16   Barrett HenleNadeau, Nicole Elizabeth, PA-C  acetaminophen (TYLENOL) 500 MG tablet Take 500 mg by mouth every 6 (six) hours as needed.    [provider]  albuterol (PROVENTIL HFA;VENTOLIN HFA) 108 (90 Base) MCG/ACT inhaler Inhale 2 puffs into the lungs every 4 (four) hours as needed for wheezing or shortness of breath. Patient not taking: Reported on 03/22/2016 08/15/15   Jerelyn ScottLinker, Martha, MD  albuterol (PROVENTIL HFA;VENTOLIN HFA) 108 (90 Base) MCG/ACT inhaler Inhale 1-2 puffs  into the lungs every 6 (six) hours as needed for wheezing or shortness of breath. Patient not taking: Reported on 04/09/2016 03/21/16   Arby BarrettePfeiffer, Marcy, MD  butalbital-acetaminophen-caffeine (FIORICET, ESGIC) [phone] MG tablet Take 1-2 tablets by mouth every 6 (six) hours as needed for headache. Patient not taking: Reported on 04/09/2016 04/02/16 04/02/17  Fayrene Helperran, Bowie, PA-C    Family History History reviewed. No pertinent family history.  Social History Social History  Substance Use Topics  . Smoking status: Current Every Day Smoker    Packs/day: 1.00    Years: 0.00    Types: Cigarettes, Cigars  . Smokeless tobacco: Never Used  . Alcohol use No     Allergies   Haldol [haloperidol lactate]   Review of Systems Review of Systems  Constitutional: Negative for appetite change, chills, diaphoresis, fatigue and fever.  HENT: Positive for trouble swallowing. Negative for mouth sores and sore throat.        Jaw pain and tightness, like it "locked up"  Eyes: Negative for visual disturbance.  Respiratory: Negative for cough, chest tightness, shortness of breath and wheezing.   Cardiovascular: Negative for chest pain.  Gastrointestinal: Negative for abdominal distention, abdominal pain, diarrhea, nausea and vomiting.  Endocrine: Negative for polydipsia, polyphagia and polyuria.  Genitourinary: Negative for dysuria, frequency and hematuria.  Musculoskeletal: Negative for gait problem.  Skin: Negative for color change, pallor and rash.  Neurological: Negative for dizziness, syncope, light-headedness and headaches.  Hematological: Does not bruise/bleed  easily.  Psychiatric/Behavioral: Negative for behavioral problems and confusion.     Physical Exam Updated Vital Signs BP (!) 158/85 (BP Location: Left Arm)   Pulse 64   Temp 97.9 F (36.6 C) (Oral)   Resp 18   Ht 5\' 7"  (1.702 m)   Wt 68 kg (150 lb)   SpO2 99%   BMI 23.49 kg/m   Physical Exam  Constitutional: He is oriented to  person, place, and time. He appears well-developed and well-nourished. No distress.  HENT:  Head: Normocephalic.  No abnormal movements of tongue, or neck. Able to protrude tongue fully. Pharynx normal. Neck exam normal.  Eyes: Conjunctivae are normal. Pupils are equal, round, and reactive to light. No scleral icterus.  Neck: Normal range of motion. Neck supple. No thyromegaly present.  Cardiovascular: Normal rate and regular rhythm.  Exam reveals no gallop and no friction rub.   No murmur heard. Pulmonary/Chest: Effort normal and breath sounds normal. No respiratory distress. He has no wheezes. He has no rales.  Abdominal: Soft. Bowel sounds are normal. He exhibits no distension. There is no tenderness. There is no rebound.  Musculoskeletal: Normal range of motion.  Neurological: He is alert and oriented to person, place, and time.  Skin: Skin is warm and dry. No rash noted.  Psychiatric: He has a normal mood and affect. His behavior is normal.     ED Treatments / Results  Labs (all labs ordered are listed, but only abnormal results are displayed) Labs Reviewed - No data to display  EKG  EKG Interpretation  Date/Time:  Sunday July 25 2016 14:33:28 EDT Ventricular Rate:  57 PR Interval:  138 QRS Duration: 78 QT Interval:  400 QTC Calculation: 389 R Axis:   80 Text Interpretation:  Sinus bradycardia RSR' or QR pattern in V1 suggests right ventricular conduction delay Cannot rule out Anterior infarct , age undetermined Abnormal ECG Since prior ECG, patient heart rate has decreased Confirmed by Alvira Monday ([id number]) on 07/25/2016 5:34:32 PM       Radiology No results found.  Procedures Procedures (including critical care time)  Medications Ordered in ED Medications  acetaminophen (TYLENOL) tablet 650 mg (not administered)  alum & mag hydroxide-simeth (MAALOX/MYLANTA) 200-200-20 MG/5ML suspension 30 mL (not administered)  hydrOXYzine (ATARAX/VISTARIL) tablet 25 mg (not  administered)  magnesium hydroxide (MILK OF MAGNESIA) suspension 30 mL (not administered)  traZODone (DESYREL) tablet 50 mg (50 mg Oral Given 07/24/16 2357)  diphenhydrAMINE (BENADRYL) capsule 50 mg ( Oral See Alternative 07/26/16 1350)    Or  diphenhydrAMINE (BENADRYL) injection 50 mg (50 mg Intramuscular Given 07/26/16 1350)  LORazepam (ATIVAN) 2 MG/ML injection (not administered)  diphenhydrAMINE (BENADRYL) capsule 25 mg (not administered)     Initial Impression / Assessment and Plan / ED Course  I have reviewed the triage vital signs and the nursing notes.  Pertinent labs & imaging results that were available during my care of the patient were reviewed by me and considered in my medical decision making (see chart for details).     Per mar no haldol since yesterday. Sx consistent with dystonia--resolved. Recommend benadryl q 6 hrs x 24, and no Haldol. Further inpatient psychiatric care per [facility] staff.  Final Clinical Impressions(s) / ED Diagnoses   Final diagnoses:  Medication side effect  Dystonia    New Prescriptions New Prescriptions   No medications on file     Rolland Porter, MD 07/26/16 1640

## 2016-07-26 NOTE — Progress Notes (Signed)
[facility] MD Progress Note  07/26/2016 12:34 PM Dustin Norris  MRN:  [id number] Subjective:  Patient states " I did not do any of that. They are exaggerating all that."  Objective:Patient seen and chart reviewed.Discussed patient with treatment team. Pt today seen in room. Reports he is doing better. Pt today tries to minimize his sx, reports he is less anxious now and is not angry. He reports he had an allergic reaction to haldol but did not know that prior to receiving it since he never took it before. 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 He reports he is working with some one to get an apartment. Pt reports he does not think he has an issue with cocaine abuse , keeps stating " Oh that was in the past .' Per staff , pt has been calm, cooperative , has not had any disruptive behaviors noted.      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 Cocaine use disorder, moderate, dependence (HCC) [F14.20] 07/26/2016  . 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 Psychosis [F29] 07/24/2016  . Chest pain [R07.9] 11/23/2015  . Asthma [J45.909] 11/23/2015  . Tobacco abuse [Z72.0] 11/23/2015  . Elevated lactic acid level [R79.89] 11/23/2015  . Cocaine abuse [F14.10]   . Concern about STD in male without diagnosis [Z71.1]   . Paranoid ideation (HCC) [F22] 02/22/2015   Total Time spent with patient: 20 minutes  Past Psychiatric History: Please see H&P.   Past Medical History:  Past Medical History:  Diagnosis Date  . Anxiety   . Asthma   . Cocaine abuse   . Homelessness   . Migraine   . Tobacco abuse    History reviewed. No pertinent surgical history. Family History: Please see H&P.  Family Psychiatric  History: Please see  H&P.  Social History:  History  Alcohol Use No     History  Drug Use No    Comment: last use October 2017    Social History   Social History  . Marital status: Single    Spouse name: N/A  . Number of children: N/A  . Years of education: N/A   Social History Main Topics  . Smoking status: Current Every Day Smoker    Packs/day: 1.00    Years: 0.00    Types: Cigarettes, Cigars  . Smokeless tobacco: Never Used  . Alcohol use No  . Drug use: No     Comment: last use October 2017  . Sexual activity: Not Asked   Other Topics Concern  . None   Social History Narrative   ** Merged History Encounter **       Additional Social History:                         Sleep: Fair  Appetite:  Fair  Current Medications: Current Facility-Administered Medications  Medication Dose Route Frequency Provider Last Rate Last Dose  . acetaminophen (TYLENOL) tablet 650 mg  650 mg Oral Q6H PRN Laveda Abbe, NP      . alum & mag hydroxide-simeth (MAALOX/MYLANTA) 200-200-20 MG/5ML suspension 30 mL  30 mL Oral Q4H PRN Laveda Abbe, NP      .  diphenhydrAMINE (BENADRYL) capsule 50 mg  50 mg Oral Q6H PRN Laveda Abbe, NP       Or  . diphenhydrAMINE (BENADRYL) injection 50 mg  50 mg Intramuscular Q6H PRN Laveda Abbe, NP      . hydrOXYzine (ATARAX/VISTARIL) tablet 25 mg  25 mg Oral TID PRN Laveda Abbe, NP      . magnesium hydroxide (MILK OF MAGNESIA) suspension 30 mL  30 mL Oral Daily PRN Laveda Abbe, NP      . traZODone (DESYREL) tablet 50 mg  50 mg Oral QHS PRN Laveda Abbe, NP   50 mg at 07/24/16 2357    Lab Results: No results found for this or any previous visit (from the past 48 hour(s)).  Blood Alcohol level:  Lab Results  Component Value Date   ETH <5 07/21/2016   ETH <5 11/23/2015    Metabolic Disorder Labs: Lab Results  Component Value Date   HGBA1C 5.3 11/23/2015   MPG 105 11/23/2015   No results  found for: PROLACTIN Lab Results  Component Value Date   CHOL 111 11/23/2015   TRIG 20 11/23/2015   HDL 61 11/23/2015   CHOLHDL 1.8 11/23/2015   VLDL 4 11/23/2015   LDLCALC 46 11/23/2015    Physical Findings: AIMS: Facial and Oral Movements Muscles of Facial Expression: None, normal Lips and Perioral Area: None, normal Jaw: None, normal Tongue: None, normal,Extremity Movements Upper (arms, wrists, hands, fingers): None, normal Lower (legs, knees, ankles, toes): None, normal, Trunk Movements Neck, shoulders, hips: None, normal, Overall Severity Severity of abnormal movements (highest score from questions above): None, normal Incapacitation due to abnormal movements: None, normal Patient's awareness of abnormal movements (rate only patient's report): No Awareness, Dental Status Current problems with teeth and/or dentures?: No Does patient usually wear dentures?: No  CIWA:    COWS:     Musculoskeletal: Strength & Muscle Tone: within normal limits Gait & Station: normal Patient leans: N/A  Psychiatric Specialty Exam: Physical Exam  Nursing note and vitals reviewed.   Review of Systems  Psychiatric/Behavioral: Positive for substance abuse. The patient is nervous/anxious.   All other systems reviewed and are negative.   Blood pressure 109/77, pulse 74, temperature 97.9 F (36.6 C), temperature source Oral, resp. rate 18, height 5\' 7"  (1.702 m), weight 68 kg (150 lb), SpO2 95 %.Body mass index is 23.49 kg/m.  General Appearance: Guarded  Eye Contact:  Fair  Speech:  Normal Rate  Volume:  Normal  Mood:  Anxious  Affect:  Congruent  Thought Process:  Goal Directed and Descriptions of Associations: Intact  Orientation:  Full (Time, Place, and Person)  Thought Content:  Logical  Suicidal Thoughts:  No  Homicidal Thoughts:  No  Memory:  Immediate;   Fair Recent;   Fair Remote;   Fair  Judgement:  Impaired  Insight:  Shallow  Psychomotor Activity:  Normal   Concentration:  Concentration: Fair and Attention Span: Fair  Recall:  [employer] of Knowledge:  Fair  Language:  Fair  Akathisia:  No  Handed:  Right  AIMS (if indicated):     Assets:  Desire for Improvement  ADL's:  Intact  Cognition:  WNL  Sleep:  Number of Hours: 6.72    Will continue today 07/26/16  plan as below except where it is noted.  Treatment Plan Summary:Patient presented with psychosis and aggression, he denies past hx of mental illness or treatment , however has a hx of substance  abuse - cocaine , cannabis , which he tries to minimize.Pt has had multiple visits to ED in the past , mostly for headaches as well as was admitted to OBS unit recently for paranoia. Will monitor closely.  Daily contact with patient to assess and evaluate symptoms and progress in treatment, Medication management and Plan see below  Will continue PRN medications for agitation/anxiety sx. Will continue Trazodone 50 mg po qhs prn for insomnia. Provided substance abuse counseling. CSW will continue to work on disposition.  Yazleemar Strassner, MD 07/26/2016, 12:34 PM

## 2016-07-26 NOTE — ED Notes (Signed)
Per EMS, [facility] and had throat swelling. Pt given 50mg  benadryl IM prior to EMS arrival. Pt ambulatory on scene, tracheal sounds clear, no stridor or wheezing. Pt did not appear to be in distress for EMS.

## 2016-07-27 DIAGNOSIS — G2402 Drug induced acute dystonia: Secondary | ICD-10-CM | POA: Clinically undetermined

## 2016-07-27 DIAGNOSIS — T43505A Adverse effect of unspecified antipsychotics and neuroleptics, initial encounter: Secondary | ICD-10-CM

## 2016-07-27 MED ORDER — DIPHENHYDRAMINE HCL 25 MG PO CAPS: 25.0000 mg | ORAL_CAPSULE | Freq: Two times a day (BID) | ORAL | Status: DC | PRN

## 2016-07-27 MED ORDER — DIPHENHYDRAMINE HCL 50 MG/ML IJ SOLN
25.0000 mg | Freq: Two times a day (BID) | INTRAMUSCULAR | Status: DC | PRN
Start: 2016-07-27 — End: 2016-07-28

## 2016-07-27 MED ORDER — DIPHENHYDRAMINE HCL 50 MG/ML IJ SOLN: 25.0000 mg | Freq: Four times a day (QID) | INTRAMUSCULAR | Status: DC | PRN

## 2016-07-27 MED ORDER — DIPHENHYDRAMINE HCL 25 MG PO CAPS
25.0000 mg | ORAL_CAPSULE | Freq: Three times a day (TID) | ORAL | Status: DC
  Administered 2016-07-27 – 2016-07-28 (×3): 25 mg via ORAL
  Filled 2016-07-27 (×6): qty 1

## 2016-07-27 MED ORDER — DIPHENHYDRAMINE HCL 25 MG PO CAPS: 25.0000 mg | ORAL_CAPSULE | Freq: Four times a day (QID) | ORAL | Status: DC | PRN

## 2016-07-27 NOTE — Progress Notes (Signed)
DAR NOTE: Patient presents with bright  affect and jovial  mood. Pt has been observed in the dayroom interacting and watching tv with peers. Denies pain, auditory and visual hallucinations.  Rates depression at 5, hopelessness at 4, and anxiety at 4.  Maintained on routine safety checks.  Medications given as prescribed.  Support and encouragement offered as needed.   Offered no complaint.

## 2016-07-27 NOTE — Progress Notes (Signed)
Recreation Therapy Notes  Animal-Assisted Activity (AAA) Program Checklist/Progress Notes Patient Eligibility Criteria Checklist & Daily Group note for Rec Tx Intervention  Date: 06.26.2018 Time: 2:45pm [address]    AAA/T Program Assumption of Risk Form signed by Patient/ or Parent Legal Guardian Yes  Patient is free of allergies or sever asthma Yes  Patient reports no fear of animals Yes  Patient reports no history of cruelty to animals Yes  Patient understands his/her participation is voluntary Yes  Behavioral Response: Did not attend.    Clinical Observations/Feedback: Patient discussed with MD for appropriateness in pet therapy session. Both LRT and MD agree patient is appropriate for participation. Patient offered participation in session, however he politely declined.   Marykay Lexenise L Maysa Lynn, LRT/CTRS       Gaetano Romberger L 07/27/2016 3:14 PM

## 2016-07-27 NOTE — Progress Notes (Signed)
Recreation Therapy Notes  INPATIENT RECREATION THERAPY ASSESSMENT  Patient Details Name: Salvatore MarvelLeonard Rolph MRN: [id number] DOB: 06-02-1975 Today's Date: 07/27/2016  Patient Stressors:  (Pt identified no stressors.)  Pt stated he was here for migraine headaches.  Coping Skills:   Avoidance, Talking, Music  Personal Challenges: Communication, Concentration, Decision-Making  Leisure Interests (2+):  Sports - Basketball  Awareness of Community Resources:  No  Patient Strengths:  Risk analystCutting hair; counting money  Patient Identified Areas of Improvement:  Making more money; eating good  Current Recreation Participation:  Every other day  Patient Goal for Hospitalization:  "Be careful"  [place]  [place]idence[place]  Current [place] (including self-harm):  No  Current HI:  No  Consent to Intern Participation: N/A   Caroll RancherMarjette Tajuan Dufault, LRT/CTRS  Caroll RancherLindsay, Jahmal Dunavant A 07/27/2016, 9:13 AM

## 2016-07-27 NOTE — Progress Notes (Signed)
[facility] MD Progress Note  07/27/2016 2:23 PM Salvatore MarvelLeonard Meares  MRN:  [id number] Subjective: Patient states " I am ok.'   Objective:Patient seen and chart reviewed.Discussed patient with treatment team.  Pt today seen as sad mostly about being in the hospital. Pt continues to report he does not need any medications, but is willing to take benadryl for his dystonic reaction. Pt had another episode of dystonia and was send to ED yesterday after giving benadryl and ativan IM. Pt currently denies any spasms , pain or rigidity. Continue to monitor.     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 Cocaine use disorder, moderate, dependence (HCC) [F14.20] 07/26/2016  . 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 Psychosis [F29] 07/24/2016  . Chest pain [R07.9] 11/23/2015  . Asthma [J45.909] 11/23/2015  . Tobacco abuse [Z72.0] 11/23/2015  . Elevated lactic acid level [R79.89] 11/23/2015  . Cocaine abuse [F14.10]   . Concern about STD in male without diagnosis [Z71.1]   . Paranoid ideation (HCC) [F22] 02/22/2015   Total Time spent with patient: 20 minutes  Past Psychiatric History: Please see H&P.   Past Medical History:  Past Medical History:  Diagnosis Date  . Anxiety   . Asthma   . Cocaine abuse   . Homelessness   . Migraine   . Tobacco abuse    History reviewed. No pertinent surgical history. Family History: Please see H&P.  Family Psychiatric  History: Please see H&P.  Social History:  History  Alcohol Use No     History  Drug Use No    Comment: last use October 2017    Social History   Social History  . Marital status: Single    Spouse name: N/A  . Number of children: N/A  . Years of education: N/A   Social History  Main Topics  . Smoking status: Current Every Day Smoker    Packs/day: 1.00    Years: 0.00    Types: Cigarettes, Cigars  . Smokeless tobacco: Never Used  . Alcohol use No  . Drug use: No     Comment: last use October 2017  . Sexual activity: Not Asked   Other Topics Concern  . None   Social History Narrative   ** Merged History Encounter **       Additional Social History:                         Sleep: Fair  Appetite:  Fair  Current Medications: Current Facility-Administered Medications  Medication Dose Route Frequency Provider Last Rate Last Dose  . acetaminophen (TYLENOL) tablet 650 mg  650 mg Oral Q6H PRN Laveda AbbeParks, Laurie Britton, NP      . alum & mag hydroxide-simeth (MAALOX/MYLANTA) 200-200-20 MG/5ML suspension 30 mL  30 mL Oral Q4H PRN Laveda AbbeParks, Laurie Britton, NP      . diphenhydrAMINE (BENADRYL) capsule 25 mg  25 mg Oral TID Jomarie LongsEappen, Rosealie Reach, MD   25 mg at 07/27/16 1204  . diphenhydrAMINE (BENADRYL) capsule 25 mg  25 mg Oral BID PRN Jomarie LongsEappen, Eda Magnussen, MD       Or  . diphenhydrAMINE (BENADRYL) injection 25 mg  25 mg Intramuscular BID PRN Ellasyn Swilling, MD      . hydrOXYzine (ATARAX/VISTARIL) tablet 25 mg  25 mg  Oral TID PRN Laveda Abbe, NP      . magnesium hydroxide (MILK OF MAGNESIA) suspension 30 mL  30 mL Oral Daily PRN Laveda Abbe, NP      . traZODone (DESYREL) tablet 50 mg  50 mg Oral QHS PRN Laveda Abbe, NP   50 mg at 07/24/16 2357    Lab Results: No results found for this or any previous visit (from the past 48 hour(s)).  Blood Alcohol level:  Lab Results  Component Value Date   ETH <5 07/21/2016   ETH <5 11/23/2015    Metabolic Disorder Labs: Lab Results  Component Value Date   HGBA1C 5.3 11/23/2015   MPG 105 11/23/2015   No results found for: PROLACTIN Lab Results  Component Value Date   CHOL 111 11/23/2015   TRIG 20 11/23/2015   HDL 61 11/23/2015   CHOLHDL 1.8 11/23/2015   VLDL 4 11/23/2015   LDLCALC 46  11/23/2015    Physical Findings: AIMS: Facial and Oral Movements Muscles of Facial Expression: None, normal Lips and Perioral Area: None, normal Jaw: None, normal Tongue: None, normal,Extremity Movements Upper (arms, wrists, hands, fingers): None, normal Lower (legs, knees, ankles, toes): None, normal, Trunk Movements Neck, shoulders, hips: None, normal, Overall Severity Severity of abnormal movements (highest score from questions above): None, normal Incapacitation due to abnormal movements: None, normal Patient's awareness of abnormal movements (rate only patient's report): No Awareness, Dental Status Current problems with teeth and/or dentures?: No Does patient usually wear dentures?: No  CIWA:    COWS:     Musculoskeletal: Strength & Muscle Tone: within normal limits Gait & Station: normal Patient leans: N/A  Psychiatric Specialty Exam: Physical Exam  Nursing note and vitals reviewed.   Review of Systems  Psychiatric/Behavioral: Positive for substance abuse. The patient is nervous/anxious.   All other systems reviewed and are negative.   Blood pressure 116/87, pulse 70, temperature 97.9 F (36.6 C), temperature source Oral, resp. rate 16, height 5\' 7"  (1.702 m), weight 68 kg (150 lb), SpO2 96 %.Body mass index is 23.49 kg/m.  General Appearance: Guarded  Eye Contact:  Fair  Speech:  Normal Rate  Volume:  Normal  Mood:  Anxious  Affect:  Congruent  Thought Process:  Goal Directed and Descriptions of Associations: Intact  Orientation:  Full (Time, Place, and Person)  Thought Content:  Logical  Suicidal Thoughts:  No  Homicidal Thoughts:  No  Memory:  Immediate;   Fair Recent;   Fair Remote;   Fair  Judgement:  Impaired  Insight:  Shallow  Psychomotor Activity:  Normal  Concentration:  Concentration: Fair and Attention Span: Fair  Recall:  [employer] of Knowledge:  Fair  Language:  Fair  Akathisia:  No  Handed:  Right  AIMS (if indicated):     Assets:   Desire for Improvement  ADL's:  Intact  Cognition:  WNL  Sleep:  Number of Hours: 6.5   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     Treatment Plan Summary: Patient presented with psychosis , cocaine and cannabis abuse , developed a dystonic reaction after receiving haldol . Pt continued to have dystonia after haldol was stopped . Hence had to be send to ED yesterday . Pt currently on benadryl q6 h , will monitor closely today and possible discharge tomorrow.   Daily contact with patient to assess  and evaluate symptoms and progress in treatment, Medication management and Plan see below Will give Benadryl 25 mg po tid today for acute dystonia - will monitor closely. Will continue PRN medications for agitation/anxiety sx. Will continue Trazodone 50 mg po qhs prn for insomnia. Provided substance abuse counseling. CSW will continue to work on disposition.  Kiora Hallberg, MD 07/27/2016, 2:23 PM

## 2016-07-27 NOTE — Progress Notes (Signed)
Adult Psychoeducational Group Note  Date:  07/27/2016 Time:  8:59 PM  Group Topic/Focus:  Wrap-Up Group:   The focus of this group is to help patients review their daily goal of treatment and discuss progress on daily workbooks.  Participation Level:  Minimal  Participation Quality:  Attentive  Affect:  Blunted  Cognitive:  Appropriate  Insight: Appropriate  Engagement in Group:  Engaged  Modes of Intervention:  Discussion  Additional Comments:  Pt stated his goal was to get on the right medications. Pt stated he is glad to be alive. Pt was encouraged to make her needs known to staff.  Caswell CorwinOwen, Savonna Birchmeier C 07/27/2016, 8:59 PM

## 2016-07-27 NOTE — [facility] Group Notes (Signed)
[facility] LCSW Group Therapy  07/27/2016 2:23 PM   Type of Therapy:  Group Therapy  Participation Level:  Active  Participation Quality:  Attentive  Affect:  Appropriate  Cognitive:  Appropriate  Insight:  Improving  Engagement in Therapy:  Engaged  Modes of Intervention:  Clarification, Education, Exploration and Socialization  Summary of Progress/Problems: Today's group focused on relapse prevention.  We defined the term, and then brainstormed on ways to prevent relapse. Did not attend.  Daryel Geraldorth, Azalynn Maxim B 07/27/2016 , 2:23 PM

## 2016-07-27 NOTE — Progress Notes (Signed)
Recreation Therapy Notes  Date: 07/27/16 Time: 1000 Location: 500 Hall Dayroom  Group Topic: Wellness  Goal Area(s) Addresses:  Patient will define components of whole wellness. Patient will verbalize benefit of whole wellness.  Intervention: Geophysicist/field seismologistmall beach ball, chairs  Activity: Seated Soccer.  Patients were seated in a semi circle.  Patients were to get the ball past the silver bar on the floor in order to gain a point.  LRT would at as the goalie and try to prevent patients from scoring the ball.  Patients had to score 30 points in order to win the game.  Education: Wellness, Building control surveyorDischarge Planning.   Education Outcome: Acknowledges education/In group clarification offered/Needs additional education.   Clinical Observations/Feedback: Pt did not attend group.   Caroll RancherMarjette Lynnix Schoneman, LRT/CTRS         Caroll RancherLindsay, Colinda Barth A 07/27/2016 12:01 PM

## 2016-07-28 ENCOUNTER — Encounter (HOSPITAL_COMMUNITY): Payer: Self-pay | Admitting: Behavioral Health

## 2016-07-28 MED ORDER — HYDROXYZINE HCL 25 MG PO TABS: 25.0000 mg | ORAL_TABLET | Freq: Three times a day (TID) | ORAL | 0 refills | Status: DC | PRN

## 2016-07-28 MED ORDER — TRAZODONE HCL 50 MG PO TABS: 50.0000 mg | ORAL_TABLET | Freq: Every evening | ORAL | 0 refills | Status: DC | PRN

## 2016-07-28 NOTE — Plan of Care (Signed)
[facility] The[facility] Goal: STG-Patient will attend/participate in Rec Therapy Group Ses STG-The Patient will attend and participate in Recreation Therapy Group Sessions  Outcome: Adequate for Discharge Pt attended goal setting group for recreation therapy session.  Caroll RancherMarjette Bronsyn Norris, LRT/CTRS

## 2016-07-28 NOTE — Progress Notes (Signed)
Recreation Therapy Notes  INPATIENT RECREATION TR PLAN  Patient Details Name: [name] MRN: [id number] DOB: 1976/01/25 Today's Date: 07/28/2016  Rec Therapy Plan Is patient appropriate for Therapeutic Recreation?: Yes Treatment times per week: about 3 days  Estimated Length of Stay: 5-7 days TR Treatment/Interventions: Group participation (Comment)  Discharge Criteria Pt will be discharged from therapy if:: Discharged Treatment plan/goals/alternatives discussed and agreed upon by:: Patient/family  Discharge Summary Short term goals set:  Patient will attend and participate in Recreation Therapy Group Sessions  Short term goals met: Adequate for discharge Progress toward goals comments: Groups attended Which groups?: Goal setting Reason goals not met: Pt attended one group. Therapeutic equipment acquired: N/A Reason patient discharged from therapy: Discharge from hospital Pt/family agrees with progress & goals achieved: Yes Date patient discharged from therapy: 07/28/16  Dustin Norris, LRT/CTRS  Ria Comment, Chyan Carnero A 07/28/2016, 11:48 AM
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
## 2016-07-28 NOTE — Progress Notes (Signed)
Patient ID: Dustin MarvelLeonard Norris, male   DOB: 04-24-1975, 41 y.o.   MRN: [id number] Patient discharged to home/self care. Patient acknowledged understanding of all discharge instructions, as well as receipt of all belongings.  Patient denies SI, HI, and AVH upon discharge.  Patient stated that he was happy to go home and would follow up his care in the community.

## 2016-07-28 NOTE — Tx Team (Signed)
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 acetaminophen (TYLENOL) tablet 650 mg  650 mg Oral Q6H PRN Ethelene Hal, NP      . alum & mag hydroxide-simeth (MAALOX/MYLANTA) 200-200-20 MG/5ML suspension 30 mL  30 mL Oral Q4H PRN Ethelene Hal, NP      . diphenhydrAMINE (BENADRYL) capsule 25 mg  25 mg Oral BID PRN Ursula Alert, MD       Or  . diphenhydrAMINE (BENADRYL) injection 25 mg  25 mg Intramuscular BID PRN Eappen, Ria Clock, MD      . hydrOXYzine (ATARAX/VISTARIL) tablet 25 mg  25 mg Oral TID PRN Ethelene Hal, NP      . magnesium hydroxide (MILK OF MAGNESIA) suspension 30 mL  30 mL Oral Daily PRN Ethelene Hal, NP      . traZODone (DESYREL) tablet 50 mg  50 mg Oral QHS PRN Ethelene Hal, NP   50 mg at 07/24/16 2357    PTA Medications: Prescriptions Prior to Admission  Medication Sig Dispense Refill Last Dose  . acetaminophen (TYLENOL) 325 MG tablet Take 2 tablets (650 mg total) by mouth every 6 (six) hours as needed. (Patient not taking: Reported on 04/09/2016) 30 tablet 0 Not Taking at Unknown time  . acetaminophen (TYLENOL) 500 MG tablet Take 500 mg by mouth every 6 (six) hours as needed.   07/21/2016 at Unknown time  . albuterol (PROVENTIL HFA;VENTOLIN HFA) 108 (90 Base) MCG/ACT inhaler Inhale 2 puffs into the lungs every 4 (four) hours as needed for wheezing or shortness of breath. (Patient not  taking: Reported on 03/22/2016) 1 Inhaler 3 Not Taking at Unknown time  . albuterol (PROVENTIL HFA;VENTOLIN HFA) 108 (90 Base) MCG/ACT inhaler Inhale 1-2 puffs into the lungs every 6 (six) hours as needed for wheezing or shortness of breath. (Patient not taking: Reported on 04/09/2016) 1 Inhaler 0 Not Taking at Unknown time  . butalbital-acetaminophen-caffeine (FIORICET, ESGIC) 50-325-40 MG tablet Take 1-2 tablets by mouth every 6 (six) hours as needed for headache. (Patient not taking: Reported on 04/09/2016) 20 tablet 0 Not Taking at Unknown time    Patient Stressors: Other: Psychosis  Patient Strengths: Average or above average intelligence Communication skills General fund of knowledge Supportive family/friends  Treatment Modalities: Medication Management, Group therapy, Case management,  1 to 1 session with clinician, Psychoeducation, Recreational therapy.   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 ([place]) Long Term Goal(s): Improvement in symptoms so as ready for discharge  Short Term Goals: Ability to identify changes in lifestyle to reduce recurrence of condition will improve Ability to verbalize feelings will improve Ability to identify and develop effective coping behaviors will improve Compliance with prescribed medications will improve Ability to identify changes in lifestyle to reduce recurrence of condition will improve Ability to verbalize feelings will improve Compliance with prescribed medications will improve Ability to identify triggers associated with substance abuse/mental health issues will improve  Medication Management: Evaluate patient's response, side effects, and tolerance of medication regimen.  Therapeutic Interventions: 1 to 1 sessions, Unit Group sessions and Medication administration.  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 (HCC) Active Problems:   Psychosis   Cocaine use disorder, moderate, dependence (HCC)   Acute neuroleptic-induced dystonia   Long Term Goal(s): Improvement in symptoms so as ready for discharge  Short Term Goals: Ability to identify changes in lifestyle to reduce recurrence of condition will improve Ability to verbalize feelings will improve Ability to identify and develop effective coping behaviors will improve Compliance with prescribed medications will improve Ability to identify changes in lifestyle to reduce recurrence of condition will improve Ability to verbalize feelings will improve Compliance with prescribed medications will improve Ability to identify triggers associated with substance abuse/mental health issues will improve  Medication Management: Evaluate patient's response, side effects, and tolerance of medication regimen.  Therapeutic Interventions: 1 to 1 sessions, Unit Group sessions and Medication administration.  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 RN will report any adverse and/or side effects to prescribing provider.  Therapeutic Interventions: 1 on 1 counseling sessions, Psychoeducation, Medication administration, Evaluate responses to treatment, Monitor vital signs and CBGs as ordered, Perform/monitor CIWA, COWS,  AIMS and Fall Risk screenings as ordered, Perform wound care treatments as ordered.  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 ([place]) Long Term Goal(s): Patient will participate in recreation therapy treatment in at least 2 group sessions without prompting from LRT  Short Term Goals: Patient will demonstrate increased ability to follow instructions, as demonstrated by ability to follow LRT instructions on first prompt during recreation therapy group sessions  Treatment Modalities: Group and Pet Therapy  Therapeutic Interventions: Psychoeducation  Evaluation of Outcomes: Adequate for Discharge   LCSW Treatment Plan for Primary Diagnosis: 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  Short Term Goals: Engage patient in aftercare planning with referrals and resources  Therapeutic Interventions: Assess for all discharge needs, 1 to 1 time with Social worker, Explore available resources and support systems, Assess for adequacy in community support network, Educate family and significant other(s) on suicide prevention, Complete Psychosocial Assessment, Interpersonal group therapy.  Evaluation of Outcomes: Met  Return home, follow up [facility]   Progress in Treatment: Attending groups: No Participating in groups: No Taking medication as prescribed: Yes Toleration medication: Yes, no side effects reported at this time Family/Significant other contact made: No Patient understands diagnosis:No Limited insight Discussing patient identified problems/goals with staff: Yes Medical problems stabilized or resolved: Yes Denies suicidal/homicidal ideation: Yes Issues/concerns per  patient self-inventory: None Other: N/A  New problem(s) identified:  New Short Term/Long Term Goal(s):  "I want to be discharged as soon as possible.  I have to get back to work."  Discharge Plan or Barriers:   Reason for Continuation of Hospitalization:   Estimated Length of Stay: D/C today  Attendees: Patient:  07/28/2016  10:21 AM  Physician: Ursula Alert, MD 07/28/2016  10:21 AM  Nursing: Sena Hitch, RN 07/28/2016  10:21 AM  RN Care Manager: Lars Pinks, RN 07/28/2016  10:21 AM  Social Worker: Ripley Fraise 07/28/2016  10:21 AM  Recreational Therapist: Victorino Sparrow, LRT/CTRS 07/28/2016  10:21 AM  Other: Norberto Sorenson 07/28/2016  10:21 AM  Other:  07/28/2016  10:21 AM    Scribe for Treatment Team:  Roque Lias LCSW 07/28/2016 10:21 AM

## 2016-07-28 NOTE — [facility] Suicide Risk Assessment (Signed)
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 Cocaine use disorder, moderate, dependence (HCC) [F14.20] 07/26/2016  . 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 Psychosis [F29] 07/24/2016  . Chest pain [R07.9] 11/23/2015  . Asthma [J45.909] 11/23/2015  . Tobacco abuse [Z72.0] 11/23/2015  . Elevated lactic acid level [R79.89] 11/23/2015  . Cocaine abuse [F14.10]   . Concern about STD in male without diagnosis [Z71.1]   . Paranoid ideation (HCC) [F22] 02/22/2015    Total Time spent with patient: 30 minutes  Musculoskeletal: Strength & Muscle Tone: within normal limits Gait & Station: normal Patient leans: N/A  Psychiatric Specialty Exam: Review of Systems  Psychiatric/Behavioral: Positive for substance abuse. Negative for depression, hallucinations and suicidal ideas.  All other systems reviewed and are negative.   Blood pressure 116/81, pulse 73, temperature 97.7 F (36.5 C), temperature source Oral, resp. rate 16, height 5\' 7"  (1.702 m), weight 68 kg (150 lb), SpO2 96 %.Body mass index is 23.49 kg/m.  General Appearance: Fairly Groomed  Patent attorneyye Contact::  Fair  Speech:  Normal [id number]  Volume:  Normal  Mood:  Euthymic  Affect:  Congruent  Thought Process:  Goal Directed and Descriptions of Associations: Intact  Orientation:  Full (Time, Place, and Person)  Thought Content:  Logical  Suicidal Thoughts:  No  Homicidal Thoughts:  No  Memory:  Immediate;   Fair Recent;   Fair Remote;   Fair  Judgement:  Fair  Insight:  Fair  Psychomotor Activity:  Normal  Concentration:  Fair  Recall:  FiservFair  Fund of Knowledge:Fair  Language: Fair  Akathisia:  No  Handed:  Right  AIMS (if  indicated):   denies dystonic [place], denies rigidity, stiffness, spasms   Assets:  Communication Skills Desire for Improvement  Sleep:  Number of Hours: 6.5  Cognition: WNL  ADL's:  Intact   Mental Status Per Nursing Assessment::   On Admission:  NA  Demographic Factors:  Male  Loss Factors: NA  Historical Factors: Impulsivity  Risk Reduction Factors:   Positive social support and Positive therapeutic relationship  Continued Clinical Symptoms:  Alcohol/Substance Abuse/Dependencies  Cognitive Features That Contribute To Risk:  None    Suicide Risk:  Minimal: No identifiable suicidal ideation.  Patients presenting with no risk factors but with morbid ruminations; may be classified as minimal risk based on the severity of the depressive symptoms  Follow-up Information    [facility] Follow up on 08/02/2016.   Specialty:  Behavioral Health Why:  Monday at 10:00 for your hospital follow up appointment Contact information: [address] [place] [address] [phone]           Plan Of Care/Follow-up recommendations:  Activity:  no restrictions Diet:  regular Tests:  as needed Other:  follow up with aftercare  Hye Trawick, MD 07/28/2016, 10:05 AM

## 2016-07-28 NOTE — Progress Notes (Signed)
  [facility] :  Will you be returning to the same living situation after discharge:  Yes,  [facility] At discharge, do you have transportation home?: Yes,  bus pass Do you have the ability to pay for your medications: Yes,  No meds  Release of information consent forms completed and in the chart;  Patient's signature needed at discharge.  Patient to Follow up at: Follow-up Information    [facility] Follow up on 08/02/2016.   Specialty:  Behavioral Health Why:  Monday at 10:00 for your hospital follow up appointment Contact information: [address] [place] [address] [phone]           Next level of care provider has access to [facility] Link:no  Safety Planning and Suicide Prevention discussed: Yes,  yes  Have you used any form of tobacco in the last 30 days? (Cigarettes, Smokeless Tobacco, Cigars, and/or Pipes): Yes  Has patient been referred to the Quitline?: Patient refused referral  Patient has been referred for addiction treatment: Pt. refused referral  Ida RogueRodney B Yaden Seith 07/28/2016, 10:22 AM

## 2016-07-28 NOTE — Progress Notes (Signed)
Recreation Therapy Notes  Date: 07/28/16 Time: 1000 Location: 500 [address] Dayroom  Group Topic: Leisure Education, Goal Setting  Goal Area(s) Addresses:  Patient will be able to identify at least 3 life goals.  Patient will be able to identify benefit of investing in life goals.  Patient will be able to identify benefit of setting life goals.   Behavioral Response:  Engaged  Intervention: Worksheet, pencils  Activity:  Life Goals.  Patients were given a worksheet with six categories (family, friends, work/school, spirituality, body and mental health).  Patients were to identify what they were good at, what they needed to improve and create a goal for each category.  Education:  Discharge Planning, Coping Skills, Life Goals   Education Outcome: Acknowledges Education/In Group Clarification Provided/Needs Additional Education  Clinical Observations:  Pt stated "you should always strive to accomplish your goals".  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   Dustin RancherMarjette Ginna Norris, LRT/CTRS         Dustin RancherLindsay, [name] 07/28/2016 12:48 PM

## 2016-07-31 ENCOUNTER — Emergency Department (HOSPITAL_COMMUNITY)
Admission: EM | Admit: 2016-07-31 | Discharge: 2016-07-31 | Disposition: A | Discharge status: Home / Self Care | Payer: Self-pay | Attending: Emergency Medicine | Admitting: Emergency Medicine

## 2016-07-31 ENCOUNTER — Encounter (HOSPITAL_COMMUNITY): Payer: Self-pay

## 2016-07-31 DIAGNOSIS — J45909 Unspecified asthma, uncomplicated: Secondary | ICD-10-CM | POA: Insufficient documentation

## 2016-07-31 DIAGNOSIS — Z79899 Other long term (current) drug therapy: Secondary | ICD-10-CM | POA: Insufficient documentation

## 2016-07-31 DIAGNOSIS — R51 Headache: Secondary | ICD-10-CM | POA: Insufficient documentation

## 2016-07-31 DIAGNOSIS — F17210 Nicotine dependence, cigarettes, uncomplicated: Secondary | ICD-10-CM | POA: Insufficient documentation

## 2016-07-31 DIAGNOSIS — Z760 Encounter for issue of repeat prescription: Secondary | ICD-10-CM | POA: Insufficient documentation

## 2016-07-31 DIAGNOSIS — R519 Headache, unspecified: Secondary | ICD-10-CM

## 2016-07-31 MED ORDER — ALBUTEROL SULFATE HFA 108 (90 BASE) MCG/ACT IN AERS: 2.0000 | INHALATION_SPRAY | RESPIRATORY_TRACT | 3 refills | Status: DC | PRN

## 2016-07-31 MED ORDER — AEROCHAMBER PLUS W/MASK MISC: 2 refills | Status: DC

## 2016-07-31 MED ORDER — ACETAMINOPHEN 500 MG PO TABS
1000.0000 mg | ORAL_TABLET | Freq: Once | ORAL | Status: AC
  Administered 2016-07-31: 1000 mg via ORAL
  Filled 2016-07-31: qty 2

## 2016-07-31 MED ORDER — ONDANSETRON 4 MG PO TBDP
4.0000 mg | ORAL_TABLET | Freq: Once | ORAL | Status: AC
  Administered 2016-07-31: 4 mg via ORAL
  Filled 2016-07-31: qty 1

## 2016-07-31 NOTE — ED Notes (Addendum)
When asked why he is here today, pt is unable to voice a complaint. However, pt states that he wants some albuterol, migraine medication, benadryl, allergy medication, anxiety medication, an EKG and to speak with an MD. He denies any pain. A&Ox4. Ambulatory.   He endorses any symptoms that are mentioned in triage, but is unable to explain or elaborate.

## 2016-07-31 NOTE — Discharge Instructions (Signed)
1. Medications: albuterol, usual home medications 2. Treatment: rest, drink plenty of fluids,  3. Follow Up: Please followup with your primary doctor in 2-3 days for discussion of your diagnoses and further evaluation after today's visit; if you do not have a primary care doctor use the resource guide provided to find one; Please return to the ER for worsening symptoms, difficulty breathing, difficulty walking, fevers, persistent vomiting

## 2016-07-31 NOTE — ED Provider Notes (Signed)
[facility]-EMERGENCY DEPT Provider Note   CSN: [id number] Arrival date & time: 07/31/16  0045     History   Chief Complaint Chief Complaint  Patient presents with  . Multiple Complaints    HPI Dustin Norris is a 41 y.o. male with a hx of anxiety, asthma, cocaine abuse, migraine headache (has never seen a neurologist), homelessness, current smoker presents to the Emergency Department complaining of gradual, persistent, progressively worsening generalized and throbbing headache onset this morning.  Pt reports this headache is the same as his previous headaches.  No thunderclap or sudden onset.  He has not taken any medications for this.  He reports some blurry vision changes and nausea standard with his headaches, but denies diplopia, numbness, tingling, weakness, gait disturbance, fever, neck pain, neck stiffness.  Pt reports no treatments PTA.  Pt reports the light hurts his eyes.  Nothing makes the symptoms better.  He requests tylenol for his headache.  Pt also requests new Rx for his albuterol as he is out of his inhaler at home.  He denies current SOB, chest pain, wheezing, URI ssx, fever, chills or sick contacts.  Pt reports he continues to smoke.     The history is provided by the patient and medical records. No language interpreter was used.    Past Medical History:  Diagnosis Date  . Anxiety   . Asthma   . Cocaine abuse   . Homelessness   . Migraine   . Tobacco abuse     Patient Active Problem List   Diagnosis Date Noted  . 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 Cocaine use disorder, moderate, dependence (HCC) 07/26/2016  . 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 Psychosis 07/24/2016  . Chest pain 11/23/2015  . Asthma 11/23/2015  . Tobacco abuse 11/23/2015  . Elevated lactic acid level 11/23/2015  . Cocaine abuse   . Concern about STD in male without diagnosis   . Paranoid ideation (HCC) 02/22/2015     History reviewed. No pertinent surgical history.     Home Medications    Prior to Admission medications   Medication Sig Start Date End Date Taking? Authorizing Provider  acetaminophen (TYLENOL) 500 MG tablet Take 500 mg by mouth every 6 (six) hours as needed.    [provider]  albuterol (PROVENTIL HFA;VENTOLIN HFA) 108 (90 Base) MCG/ACT inhaler Inhale 2 puffs into the lungs every 4 (four) hours as needed for wheezing or shortness of breath. 07/31/16   Kelse Ploch, Dahlia Client, PA-C  hydrOXYzine (ATARAX/VISTARIL) 25 MG tablet Take 1 tablet (25 mg total) by mouth 3 (three) times daily as needed for anxiety. 07/28/16   Denzil Magnuson, NP  Spacer/Aero-Holding Chambers (AEROCHAMBER PLUS WITH MASK) inhaler Use as instructed 07/31/16   Jacie Tristan, Dahlia Client, PA-C  traZODone (DESYREL) 50 MG tablet Take 1 tablet (50 mg total) by mouth at bedtime as needed for sleep. For insomnia 07/28/16   Denzil Magnuson, NP    Family History History reviewed. No pertinent family history.  Social History Social History  Substance Use Topics  . Smoking status: Current Every Day Smoker    Packs/day: 1.00    Years: 0.00    Types: Cigarettes, Cigars  . Smokeless tobacco: Never Used  . Alcohol use No     Allergies   Haldol [haloperidol lactate]   Review of Systems Review of Systems  Constitutional: Negative for appetite change, diaphoresis, fatigue, fever and unexpected weight change.  HENT: Negative for mouth sores.   Eyes:  Positive for photophobia and visual disturbance.  Respiratory: Negative for cough, chest tightness, shortness of breath and wheezing.   Cardiovascular: Negative for chest pain.  Gastrointestinal: Positive for nausea. Negative for abdominal pain, constipation, diarrhea and vomiting.  Endocrine: Negative for polydipsia, polyphagia and polyuria.  Genitourinary: Negative for dysuria, frequency, hematuria and urgency.  Musculoskeletal: Negative for back pain and neck  stiffness.  Skin: Negative for rash.  Allergic/Immunologic: Negative for immunocompromised state.  Neurological: Positive for headaches. Negative for syncope and light-headedness.  Hematological: Does not bruise/bleed easily.  Psychiatric/Behavioral: Negative for sleep disturbance. The patient is not nervous/anxious.   All other systems reviewed and are negative.    Physical Exam Updated Vital Signs BP (!) 144/85 (BP Location: Left Arm)   Pulse 85   Temp 98.9 F (37.2 C) (Oral)   Resp 14   SpO2 99%   Physical Exam  Constitutional: He is oriented to person, place, and time. He appears well-developed and well-nourished. No distress.  HENT:  Head: Normocephalic and atraumatic.  Mouth/Throat: Oropharynx is clear and moist.  Eyes: Conjunctivae and EOM are normal. Pupils are equal, round, and reactive to light. No scleral icterus.  No horizontal, vertical or rotational nystagmus  Neck: Normal range of motion. Neck supple.  Full active and passive ROM without pain No midline or paraspinal tenderness No nuchal rigidity or meningeal signs  Cardiovascular: Normal rate, regular rhythm and intact distal pulses.   Pulmonary/Chest: Effort normal and breath sounds normal. No respiratory distress. He has no wheezes. He has no rales.  Clear and equal breath sounds  Abdominal: Soft. Bowel sounds are normal. There is no tenderness. There is no rebound and no guarding.  Musculoskeletal: Normal range of motion.  Lymphadenopathy:    He has no cervical adenopathy.  Neurological: He is alert and oriented to person, place, and time. No cranial nerve deficit. He exhibits normal muscle tone. Coordination normal.  Mental Status:  Alert, oriented, thought content appropriate. Speech fluent without evidence of aphasia. Able to follow 2 step commands without difficulty.  Cranial Nerves:  II:  Peripheral visual fields grossly normal, pupils equal, round, reactive to light III,IV, VI: ptosis not present,  extra-ocular motions intact bilaterally  V,VII: smile symmetric, facial light touch sensation equal VIII: hearing grossly normal bilaterally  IX,X: midline uvula rise  XI: bilateral shoulder shrug equal and strong XII: midline tongue extension  Motor:  5/5 in upper and lower extremities bilaterally including strong and equal grip strength and dorsiflexion/plantar flexion Sensory: Pinprick and light touch normal in all extremities.  Cerebellar: normal finger-to-nose with bilateral upper extremities Gait: normal gait and balance CV: distal pulses palpable throughout   Skin: Skin is warm and dry. No rash noted. He is not diaphoretic.  Psychiatric: He has a normal mood and affect. His behavior is normal. Judgment and thought content normal.  Nursing note and vitals reviewed.    ED Treatments / Results   Procedures Procedures (including critical care time)  Medications Ordered in ED Medications  acetaminophen (TYLENOL) tablet 1,000 mg (not administered)  ondansetron (ZOFRAN-ODT) disintegrating tablet 4 mg (not administered)     Initial Impression / Assessment and Plan / ED Course  I have reviewed the triage vital signs and the nursing notes.  Pertinent labs & imaging results that were available during my care of the patient were reviewed by me and considered in my medical decision making (see chart for details).     Pt HA treated and improved while in ED.  Presentation  is like pts typical HA and non concerning for [facility], ICH, Meningitis, or temporal arteritis. Pt is afebrile with no focal neuro deficits, nuchal rigidity, or change in vision. Pt is to follow up with PCP to discuss prophylactic medication. Pt verbalizes understanding and is agreeable with plan to dc.   Pt will also be given a refill of his albuterol.  No clinical evidence of asthma exacerbation at this time.    Final Clinical Impressions(s) / ED Diagnoses   Final diagnoses:  Bad headache  Medication refill     New Prescriptions New Prescriptions   ALBUTEROL (PROVENTIL HFA;VENTOLIN HFA) 108 (90 BASE) MCG/ACT INHALER    Inhale 2 puffs into the lungs every 4 (four) hours as needed for wheezing or shortness of breath.   SPACER/AERO-HOLDING CHAMBERS (AEROCHAMBER PLUS WITH MASK) INHALER    Use as instructed     Milta DeitersMuthersbaugh, Caelen Higinbotham, PA-C 07/31/16 0442    Dione BoozeGlick, David, MD 07/31/16 [phone]

## 2016-08-01 ENCOUNTER — Encounter (HOSPITAL_COMMUNITY): Payer: Self-pay | Admitting: Emergency Medicine

## 2016-08-01 ENCOUNTER — Emergency Department (HOSPITAL_COMMUNITY)
Admission: EM | Admit: 2016-08-01 | Discharge: 2016-08-01 | Disposition: A | Discharge status: Home / Self Care | Payer: Self-pay | Attending: Emergency Medicine | Admitting: Emergency Medicine

## 2016-08-01 DIAGNOSIS — R0602 Shortness of breath: Secondary | ICD-10-CM | POA: Insufficient documentation

## 2016-08-01 DIAGNOSIS — R1110 Vomiting, unspecified: Secondary | ICD-10-CM | POA: Insufficient documentation

## 2016-08-01 DIAGNOSIS — R519 Headache, unspecified: Secondary | ICD-10-CM

## 2016-08-01 DIAGNOSIS — R51 Headache: Secondary | ICD-10-CM | POA: Insufficient documentation

## 2016-08-01 DIAGNOSIS — F17210 Nicotine dependence, cigarettes, uncomplicated: Secondary | ICD-10-CM | POA: Insufficient documentation

## 2016-08-01 DIAGNOSIS — J45909 Unspecified asthma, uncomplicated: Secondary | ICD-10-CM | POA: Insufficient documentation

## 2016-08-01 MED ORDER — ALBUTEROL SULFATE HFA 108 (90 BASE) MCG/ACT IN AERS
1.0000 | INHALATION_SPRAY | Freq: Four times a day (QID) | RESPIRATORY_TRACT | Status: DC | PRN
  Administered 2016-08-01: 1 via RESPIRATORY_TRACT
  Filled 2016-08-01: qty 6.7

## 2016-08-01 MED ORDER — ACETAMINOPHEN 325 MG PO TABS
650.0000 mg | ORAL_TABLET | Freq: Once | ORAL | Status: AC
  Administered 2016-08-01: 650 mg via ORAL
  Filled 2016-08-01: qty 2

## 2016-08-01 NOTE — ED Notes (Signed)
ED Provider at bedside. 

## 2016-08-01 NOTE — ED Triage Notes (Signed)
patient reports having headache that started this morning and vomited once. Patient reports, "probably just need some tylenol".

## 2016-08-01 NOTE — ED Provider Notes (Signed)
[facility]ER[facility] Provider Note   CSN: [id number] Arrival date & time: 08/01/16  1118     History   Chief Complaint Chief Complaint  Patient presents with  . Headache  . Emesis    HPI Dustin Norris is a 41 y.o. male.  HPI Patient presents to the emergency room with complaints of headache and shortness of breath. The patient has a history of frequent visits to the emergency room for issues with headache and dyspnea. Patient was seen in the emergency room 6 times in June. He was ecstasy in the emergency room yesterday for issues with a headache and requested a refill of his medications. 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 He states he started having a headache that he also started to feel short of breath. He called EMS. Patient states he is feeling that her now. He feels like he just needs Tylenol as like an albuterol inhaler because he does not have one. He also would like to rest for a little while. Denies any fevers. No numbness or weakness. No neck pain or stiffness. Past Medical History:  Diagnosis Date  . Anxiety   . Asthma   . Cocaine abuse   . Homelessness   . Migraine   . Tobacco abuse     Patient Active Problem List   Diagnosis Date Noted  . 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 Cocaine use disorder, moderate, dependence (HCC) 07/26/2016  . 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 Psychosis 07/24/2016  . Chest pain 11/23/2015  . Asthma 11/23/2015  . Tobacco abuse 11/23/2015  . Elevated lactic acid level 11/23/2015  . Cocaine abuse   . Concern about STD in male without diagnosis   . Paranoid ideation (HCC) 02/22/2015    History reviewed. No pertinent surgical history.     Home Medications    Prior to Admission medications   Medication Sig Start Date End Date Taking? Authorizing Provider  acetaminophen (TYLENOL) 500 MG tablet Take 500 mg by mouth every 6 (six)  hours as needed for moderate pain.     [provider]  albuterol (PROVENTIL HFA;VENTOLIN HFA) 108 (90 Base) MCG/ACT inhaler Inhale 2 puffs into the lungs every 4 (four) hours as needed for wheezing or shortness of breath. 07/31/16   Muthersbaugh, Dahlia ClientHannah, PA-C  hydrOXYzine (ATARAX/VISTARIL) 25 MG tablet Take 1 tablet (25 mg total) by mouth 3 (three) times daily as needed for anxiety. 07/28/16   Denzil Magnusonhomas, Lashunda, NP  Spacer/Aero-Holding Chambers (AEROCHAMBER PLUS WITH MASK) inhaler Use as instructed 07/31/16   Muthersbaugh, Dahlia ClientHannah, PA-C  traZODone (DESYREL) 50 MG tablet Take 1 tablet (50 mg total) by mouth at bedtime as needed for sleep. For insomnia 07/28/16   Denzil Magnusonhomas, Lashunda, NP    Family History No family history on file.  Social History Social History  Substance Use Topics  . Smoking status: Current Every Day Smoker    Packs/day: 1.00    Years: 0.00    Types: Cigarettes, Cigars  . Smokeless tobacco: Never Used  . Alcohol use No     Allergies   Haldol [haloperidol lactate]   Review of Systems Review of Systems   Physical Exam Updated Vital Signs BP (!) 139/92   Pulse (!) 103   Temp 98.2 F (36.8 C) (Oral)   Resp (!) 22   Ht 1.702 m (5\' 7" )   SpO2 100%   Physical Exam  Constitutional: He appears well-developed and well-nourished. No  distress.  HENT:  Head: Normocephalic and atraumatic.  Right Ear: External ear normal.  Left Ear: External ear normal.  Eyes: Conjunctivae are normal. Right eye exhibits no discharge. Left eye exhibits no discharge. No scleral icterus.  Neck: Neck supple. No tracheal deviation present.  Cardiovascular: Normal rate, regular rhythm and intact distal pulses.   Pulmonary/Chest: Effort normal and breath sounds normal. No stridor. No respiratory distress. He has no wheezes. He has no rales.  Abdominal: Soft. Bowel sounds are normal. He exhibits no distension. There is no tenderness. There is no rebound and no guarding.  Musculoskeletal:  He exhibits no edema or tenderness.  Neurological: He is alert. He has normal strength. No cranial nerve deficit (no facial droop, extraocular movements intact, no slurred speech) or sensory deficit. He exhibits normal muscle tone. He displays no seizure activity. Coordination normal.  Skin: Skin is warm and dry. No rash noted.  Psychiatric: He has a normal mood and affect.  Nursing note and vitals reviewed.    ED Treatments / Results  Labs (all labs ordered are listed, but only abnormal results are displayed) Labs Reviewed - No data to display   Procedures Procedures (including critical care time)  Medications Ordered in ED Medications  acetaminophen (TYLENOL) tablet 650 mg (not administered)  albuterol (PROVENTIL HFA;VENTOLIN HFA) 108 (90 Base) MCG/ACT inhaler 1-2 puff (not administered)     Initial Impression / Assessment and Plan / ED Course  I have reviewed the triage vital signs and the nursing notes.  Pertinent labs & imaging results that were available during my care of the patient were reviewed by me and considered in my medical decision making (see chart for details).   patient presents to the emergency room with complaints of headache and shortness of breath after being out in the heat. Patient's temperature is normal. No signs of severe heat stroke or heat exhaustion. No signs to suggest acute infection. Normal neurologic exam. I doubt subarachnoid hemorrhage or other emergent etiology.  Patient's lung exam is normal. He is not wheezing. He is not tachypneic and his oxygen saturation is normal. He requested an albuterol inhaler  At this time there does not appear to be any evidence of an acute emergency medical condition and the patient appears stable for discharge with appropriate outpatient follow up.  Final Clinical Impressions(s) / ED Diagnoses   Final diagnoses:  Acute nonintractable headache, unspecified headache type    New Prescriptions New Prescriptions     No medications on file     Linwood Dibbles, MD 08/01/16 1443

## 2016-08-01 NOTE — Discharge Instructions (Signed)
Continue over the counter medications, make sure to stay hydrated in the heat, follow up with a primary care doctor

## 2016-08-01 NOTE — ED Notes (Signed)
Patient given water

## 2016-08-01 NOTE — ED Triage Notes (Signed)
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 Patient ambulatory at scene [facility].  Patient wanting refill again today.  EMS reports HR 140, patient denies any recent drug use.

## 2016-08-03 ENCOUNTER — Encounter (HOSPITAL_COMMUNITY): Payer: Self-pay | Admitting: Emergency Medicine

## 2016-08-03 ENCOUNTER — Emergency Department (HOSPITAL_COMMUNITY)
Admission: EM | Admit: 2016-08-03 | Discharge: 2016-08-03 | Disposition: A | Discharge status: Home / Self Care | Payer: Self-pay | Attending: Emergency Medicine | Admitting: Emergency Medicine

## 2016-08-03 DIAGNOSIS — F17210 Nicotine dependence, cigarettes, uncomplicated: Secondary | ICD-10-CM | POA: Insufficient documentation

## 2016-08-03 DIAGNOSIS — J45909 Unspecified asthma, uncomplicated: Secondary | ICD-10-CM | POA: Insufficient documentation

## 2016-08-03 DIAGNOSIS — G8929 Other chronic pain: Secondary | ICD-10-CM

## 2016-08-03 DIAGNOSIS — R112 Nausea with vomiting, unspecified: Secondary | ICD-10-CM | POA: Insufficient documentation

## 2016-08-03 DIAGNOSIS — R51 Headache: Secondary | ICD-10-CM | POA: Insufficient documentation

## 2016-08-03 MED ORDER — ONDANSETRON HCL 8 MG PO TABS: 8.0000 mg | ORAL_TABLET | Freq: Three times a day (TID) | ORAL | 0 refills | Status: DC | PRN

## 2016-08-03 MED ORDER — IBUPROFEN 200 MG PO TABS
600.0000 mg | ORAL_TABLET | Freq: Once | ORAL | Status: AC
  Administered 2016-08-03: 600 mg via ORAL
  Filled 2016-08-03: qty 3

## 2016-08-03 MED ORDER — ONDANSETRON 8 MG PO TBDP
8.0000 mg | ORAL_TABLET | Freq: Once | ORAL | Status: AC
  Administered 2016-08-03: 8 mg via ORAL
  Filled 2016-08-03: qty 1

## 2016-08-03 NOTE — ED Notes (Signed)
Bed: WTR9 Expected date:  Expected time:  Means of arrival:  Comments: 

## 2016-08-03 NOTE — Discharge Instructions (Signed)
Alternate between tylenol and motrin as needed for pain. Use zofran as directed as needed for nausea. Stay well hydrated. Get plenty of rest. Follow up with the  and wellness center in 1 week for recheck of symptoms and ongoing management of your headaches, and to establish medical care. Return to the ER for changes or worsening symptoms.
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
## 2016-08-04 ENCOUNTER — Encounter (HOSPITAL_COMMUNITY): Payer: Self-pay | Admitting: Emergency Medicine

## 2016-08-04 ENCOUNTER — Emergency Department (HOSPITAL_COMMUNITY)
Admission: EM | Admit: 2016-08-04 | Discharge: 2016-08-04 | Disposition: A | Discharge status: Home / Self Care | Payer: Self-pay | Attending: Emergency Medicine | Admitting: Emergency Medicine

## 2016-08-04 DIAGNOSIS — Z5321 Procedure and treatment not carried out due to patient leaving prior to being seen by health care provider: Secondary | ICD-10-CM | POA: Insufficient documentation

## 2016-08-04 DIAGNOSIS — R030 Elevated blood-pressure reading, without diagnosis of hypertension: Secondary | ICD-10-CM | POA: Insufficient documentation

## 2016-08-04 DIAGNOSIS — G43809 Other migraine, not intractable, without status migrainosus: Secondary | ICD-10-CM | POA: Insufficient documentation

## 2016-08-04 DIAGNOSIS — J45909 Unspecified asthma, uncomplicated: Secondary | ICD-10-CM | POA: Insufficient documentation

## 2016-08-04 DIAGNOSIS — Z79899 Other long term (current) drug therapy: Secondary | ICD-10-CM | POA: Insufficient documentation

## 2016-08-04 DIAGNOSIS — F17210 Nicotine dependence, cigarettes, uncomplicated: Secondary | ICD-10-CM | POA: Insufficient documentation

## 2016-08-04 DIAGNOSIS — R51 Headache: Secondary | ICD-10-CM | POA: Insufficient documentation

## 2016-08-04 MED ORDER — ONDANSETRON 8 MG PO TBDP
8.0000 mg | ORAL_TABLET | Freq: Once | ORAL | Status: AC
  Administered 2016-08-04: 8 mg via ORAL
  Filled 2016-08-04: qty 1

## 2016-08-04 MED ORDER — NAPROXEN 500 MG PO TABS
500.0000 mg | ORAL_TABLET | Freq: Once | ORAL | Status: AC
  Administered 2016-08-04: 500 mg via ORAL
  Filled 2016-08-04: qty 1

## 2016-08-04 NOTE — ED Triage Notes (Signed)
Pt reports migraine for the past 2 days accompanied by photophobia. Feels the same since yesterday.

## 2016-08-04 NOTE — ED Provider Notes (Signed)
[facility] Provider Note   CSN: [id number] Arrival date & time: 08/04/16  1743     History   Chief Complaint Chief Complaint  Patient presents with  . Migraine    HPI Dustin Norris is a 41 y.o. male.  57-year-old male with past medical history including cocaine abuse, asthma, anxiety who presents with headache. Patient states for the past 2 days he has had persistent headache that is similar to previous migraine headaches. He reports associated photophobia and nausea. He denies any fevers or recent illness. He denies any recent cocaine use. No recent head injury. No extremity weakness or numbness.   The history is provided by the patient.    Past Medical History:  Diagnosis Date  . Anxiety   . Asthma   . Cocaine abuse   . Homelessness   . Migraine   . Tobacco abuse     Patient Active Problem List   Diagnosis Date Noted  . 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 Cocaine use disorder, moderate, dependence (HCC) 07/26/2016  . 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 Psychosis 07/24/2016  . Chest pain 11/23/2015  . Asthma 11/23/2015  . Tobacco abuse 11/23/2015  . Elevated lactic acid level 11/23/2015  . Cocaine abuse   . Concern about STD in male without diagnosis   . Paranoid ideation (HCC) 02/22/2015    History reviewed. No pertinent surgical history.     Home Medications    Prior to Admission medications   Medication Sig Start Date End Date Taking? Authorizing Provider  acetaminophen (TYLENOL) 500 MG tablet Take 500 mg by mouth every 6 (six) hours as needed for moderate pain.    Yes [provider]  albuterol (PROVENTIL HFA;VENTOLIN HFA) 108 (90 Base) MCG/ACT inhaler Inhale 2 puffs into the lungs every 4 (four) hours as needed for wheezing or shortness of breath. 07/31/16  Yes Muthersbaugh, Dahlia Client, PA-C  hydrOXYzine (ATARAX/VISTARIL) 25 MG tablet Take 1 tablet (25 mg total) by  mouth 3 (three) times daily as needed for anxiety. 07/28/16   Denzil Magnuson, NP  ondansetron (ZOFRAN) 8 MG tablet Take 1 tablet (8 mg total) by mouth every 8 (eight) hours as needed for nausea or vomiting. 08/03/16   Street, [place], PA-C  Spacer/Aero-Holding Chambers (AEROCHAMBER PLUS WITH MASK) inhaler Use as instructed 07/31/16   Muthersbaugh, Dahlia Client, PA-C  traZODone (DESYREL) 50 MG tablet Take 1 tablet (50 mg total) by mouth at bedtime as needed for sleep. For insomnia 07/28/16   Denzil Magnuson, NP    Family History History reviewed. No pertinent family history.  Social History Social History  Substance Use Topics  . Smoking status: Current Every Day Smoker    Packs/day: 1.00    Years: 0.00    Types: Cigarettes, Cigars  . Smokeless tobacco: Never Used  . Alcohol use No     Allergies   Haldol [haloperidol lactate]   Review of Systems Review of Systems All other systems reviewed and are negative except that which was mentioned in HPI   Physical Exam Updated Vital Signs BP (!) 159/88 (BP Location: Right Arm)   Pulse 93   Temp 98.2 F (36.8 C) (Oral)   Resp 16   SpO2 100%   Physical Exam  Constitutional: He is oriented to person, place, and time. He appears well-developed and well-nourished. No distress.  Awake, alert  HENT:  Head: Normocephalic and atraumatic.  Eyes: Conjunctivae and EOM are normal. Pupils are equal, round, and reactive  to light.  Neck: Neck supple.  Cardiovascular: Normal rate, regular rhythm and normal heart sounds.   No murmur heard. Pulmonary/Chest: Effort normal and breath sounds normal. No respiratory distress.  Abdominal: Soft. Bowel sounds are normal. He exhibits no distension. There is no tenderness.  Musculoskeletal: He exhibits no edema.  Neurological: He is alert and oriented to person, place, and time. He has normal reflexes. No cranial nerve deficit. He exhibits normal muscle tone.  Fluent speech, normal finger-to-nose testing,  negative pronator drift, no clonus 5/5 strength and normal sensation x all 4 extremities  Skin: Skin is warm and dry.  Psychiatric: He has a normal mood and affect. Judgment and thought content normal.  Nursing note and vitals reviewed.    ED Treatments / Results  Labs (all labs ordered are listed, but only abnormal results are displayed) Labs Reviewed - No data to display  EKG  EKG Interpretation None       Radiology No results found.  Procedures Procedures (including critical care time)  Medications Ordered in ED Medications  ondansetron (ZOFRAN-ODT) disintegrating tablet 8 mg (not administered)  naproxen (NAPROSYN) tablet 500 mg (not administered)     Initial Impression / Assessment and Plan / ED Course  I have reviewed the triage vital signs and the nursing notes.      Pt w/ persistent HA similar to previous headaches, has had frequent ED visits this month for the same. Neurologically intact on exam, VS notable for HTN however no findings to suggest hypertensive emergency. I discussed treatment options with him and he elected to take oral medications. Gave naprosyn and zofran. When I checked on the patient later, he stated that he had just received the medications and wanted a Leno Mathes more time to see if the medication would work. On repeat exam, the patient had eloped.  Final Clinical Impressions(s) / ED Diagnoses   Final diagnoses:  None  Migraine headache, without status migrainosis, not intractable  New Prescriptions New Prescriptions   No medications on file     [place] Mincey, Ambrose Finlandachel Morgan, MD 08/05/16 [phone]

## 2016-08-04 NOTE — ED Triage Notes (Signed)
Pt comes in with complaints of recurrent migraines.  Pt states it flared up earlier today.  Was seen earlier this evening in triage for same but left without being seen.  Pt ambulatory during triage.  States he is on migraine medicine but it is not helping.  Hypertensive during triage.  Stated "I shouldn't have went outside and smoked that cigarette"

## 2016-08-05 ENCOUNTER — Encounter (HOSPITAL_COMMUNITY): Payer: Self-pay | Admitting: *Deleted

## 2016-08-05 ENCOUNTER — Encounter (HOSPITAL_COMMUNITY): Payer: Self-pay

## 2016-08-05 ENCOUNTER — Emergency Department (HOSPITAL_COMMUNITY)
Admission: EM | Admit: 2016-08-05 | Discharge: 2016-08-05 | Disposition: A | Discharge status: Home / Self Care | Payer: Self-pay | Attending: Emergency Medicine | Admitting: Emergency Medicine

## 2016-08-05 DIAGNOSIS — Z5321 Procedure and treatment not carried out due to patient leaving prior to being seen by health care provider: Secondary | ICD-10-CM | POA: Insufficient documentation

## 2016-08-05 DIAGNOSIS — G43009 Migraine without aura, not intractable, without status migrainosus: Secondary | ICD-10-CM | POA: Insufficient documentation

## 2016-08-05 DIAGNOSIS — G43909 Migraine, unspecified, not intractable, without status migrainosus: Secondary | ICD-10-CM

## 2016-08-05 DIAGNOSIS — F17210 Nicotine dependence, cigarettes, uncomplicated: Secondary | ICD-10-CM | POA: Insufficient documentation

## 2016-08-05 DIAGNOSIS — J45909 Unspecified asthma, uncomplicated: Secondary | ICD-10-CM | POA: Insufficient documentation

## 2016-08-05 MED ORDER — NAPROXEN 500 MG PO TABS
500.0000 mg | ORAL_TABLET | Freq: Once | ORAL | Status: AC
  Administered 2016-08-05: 500 mg via ORAL
  Filled 2016-08-05: qty 1

## 2016-08-05 NOTE — ED Notes (Signed)
Pt has been sitting in the lobby all day. This is 3rd time checking in today. Notified security and that's when he decided to check in again.

## 2016-08-05 NOTE — ED Triage Notes (Signed)
Pt presents with c/o headache for the past 3 days. Pt has been seen multiple times today and previous days for the same.

## 2016-08-05 NOTE — ED Notes (Signed)
Pt refused to have vital signs taken before leaving. Pt stated "I don't have time, I have to go."
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
## 2016-08-05 NOTE — ED Notes (Signed)
Pt called from lobby with no response. 

## 2016-08-05 NOTE — ED Provider Notes (Signed)
[facility] Provider Note   CSN: [id number] Arrival date & time: 08/05/16  0549     History   Chief Complaint Chief Complaint  Patient presents with  . Migraine    HPI Dustin Norris is a 41 y.o. male.  HPI   Patient is a 41 year old male with history of anxiety, cocaine abuse, asthma, homelessness and migraine he presents the ED with complaint of headache. Patient reports over the past 2 days he has had a constant throbbing headache to his forehead and top of his head. Reports headache is consistent with his typical migraines. States he was given Tylenol at homeless shelter without relief. Pt denies fever, neck stiffness, visual changes, photophobia, abdominal pain, N/V, urinary symptoms, numbness, tingling, weakness, seizures, syncope.  Denies any recent head injury. 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  Past Medical History:  Diagnosis Date  . Anxiety   . Asthma   . Cocaine abuse   . Homelessness   . Migraine   . Tobacco abuse     Patient Active Problem List   Diagnosis Date Noted  . 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 Cocaine use disorder, moderate, dependence (HCC) 07/26/2016  . 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 Psychosis 07/24/2016  . Chest pain 11/23/2015  . Asthma 11/23/2015  . Tobacco abuse 11/23/2015  . Elevated lactic acid level 11/23/2015  . Cocaine abuse   . Concern about STD in male without diagnosis   . Paranoid ideation (HCC) 02/22/2015    History reviewed. No pertinent surgical history.     Home Medications    Prior to Admission medications   Medication Sig Start Date End Date Taking? Authorizing Provider  acetaminophen (TYLENOL) 500 MG tablet Take 500 mg by mouth every 6 (six) hours as needed for moderate pain.     [provider]  albuterol (PROVENTIL  HFA;VENTOLIN HFA) 108 (90 Base) MCG/ACT inhaler Inhale 2 puffs into the lungs every 4 (four) hours as needed for wheezing or shortness of breath. 07/31/16   Muthersbaugh, Dahlia Client, PA-C  hydrOXYzine (ATARAX/VISTARIL) 25 MG tablet Take 1 tablet (25 mg total) by mouth 3 (three) times daily as needed for anxiety. 07/28/16   Denzil Magnuson, NP  ondansetron (ZOFRAN) 8 MG tablet Take 1 tablet (8 mg total) by mouth every 8 (eight) hours as needed for nausea or vomiting. 08/03/16   Street, [place], PA-C  Spacer/Aero-Holding Chambers (AEROCHAMBER PLUS WITH MASK) inhaler Use as instructed 07/31/16   Muthersbaugh, Dahlia Client, PA-C  traZODone (DESYREL) 50 MG tablet Take 1 tablet (50 mg total) by mouth at bedtime as needed for sleep. For insomnia 07/28/16   Denzil Magnuson, NP    Family History No family history on file.  Social History Social History  Substance Use Topics  . Smoking status: Current Every Day Smoker    Packs/day: 1.00    Years: 0.00    Types: Cigarettes, Cigars  . Smokeless tobacco: Never Used  . Alcohol use No     Allergies   Haldol [haloperidol lactate]   Review of Systems Review of Systems  Neurological: Positive for headaches.  All other systems reviewed and are negative.    Physical Exam Updated Vital Signs BP 139/79 (BP Location: Left Arm)   Pulse 85   Resp 12   Ht 5\' 7"  (1.702 m)   Wt 68 kg (150 lb)   SpO2  100%   BMI 23.49 kg/m   Physical Exam  Constitutional: He is oriented to person, place, and time. He appears well-developed and well-nourished. No distress.  HENT:  Head: Normocephalic and atraumatic.  Mouth/Throat: Oropharynx is clear and moist. No oropharyngeal exudate.  Eyes: Conjunctivae and EOM are normal. Pupils are equal, round, and reactive to light. Right eye exhibits no discharge. Left eye exhibits no discharge. No scleral icterus.  Neck: Normal range of motion. Neck supple.  Cardiovascular: Normal rate, regular rhythm, normal heart sounds and intact  distal pulses.   Pulmonary/Chest: Effort normal and breath sounds normal. No respiratory distress. He has no wheezes. He has no rales. He exhibits no tenderness.  Abdominal: Soft. He exhibits no distension and no mass. There is no tenderness. There is no rebound and no guarding.  Musculoskeletal: Normal range of motion. He exhibits no edema.  Neurological: He is alert and oriented to person, place, and time. He has normal strength. No cranial nerve deficit or sensory deficit. Coordination normal.  Skin: Skin is warm and dry. He is not diaphoretic.  Nursing note and vitals reviewed.    ED Treatments / Results  Labs (all labs ordered are listed, but only abnormal results are displayed) Labs Reviewed - No data to display  EKG  EKG Interpretation None       Radiology No results found.  Procedures Procedures (including critical care time)  Medications Ordered in ED Medications  naproxen (NAPROSYN) tablet 500 mg (500 mg Oral Given 08/05/16 [id number])     Initial Impression / Assessment and Plan / ED Course  I have reviewed the triage vital signs and the nursing notes.  Pertinent labs & imaging results that were available during my care of the patient were reviewed by me and considered in my medical decision making (see chart for details).     Pt presents with mild headache consistent with his typical headaches. Denies associated sxs. VSS. Exam unremarkable. No neuro deficits. Pt given naproxen and PO fluids in the ED. On reevaluation pt is laying in bed resting comfortably in no acute distress, reports improvement of headache. I do not suspect SAH, ICH, intracranial lesion, meningitis, encephalopathy, hematoma, dissection or encephalitis and do not think that cranial imaging is needed at this time.  Plan to d/c pt home with symptomatic tx and PCP f/u. Discussed return precautions.   Final Clinical Impressions(s) / ED Diagnoses   Final diagnoses:  Migraine without aura and without  status migrainosus, not intractable    New Prescriptions New Prescriptions   No medications on file     Barrett Henleadeau, Oluwakemi Salsberry Elizabeth, Cordelia Poche-C 08/05/16 [id number]    Palumbo, April, MD 08/08/16 2318

## 2016-08-05 NOTE — Discharge Instructions (Signed)
He may take Tylenol or ibuprofen as prescribed over-the-counter as needed for pain relief. Continue to any fluids at home to remain hydrated. Please follow up with a primary care provider from the Resource Guide provided below in 1 week for follow up evaluation. Please return to the Emergency Department if symptoms worsen or new onset of fever, neck stiffness, visual changes, light sensitivity, abdominal pain, vomiting, urinary symptoms, numbness, tingling, weakness, seizures, syncope.

## 2016-08-05 NOTE — ED Notes (Signed)
Went out to lobby to get pt to take to Rm 9 but pt was not in the lobby

## 2016-08-05 NOTE — ED Notes (Signed)
Evaluated by EDPA

## 2016-08-05 NOTE — ED Notes (Signed)
Pt reports that he needs to leave to catch the bus. Aware that he will be leaving against medical advice.

## 2016-08-05 NOTE — ED Notes (Signed)
Pt called for room placement x2 without response

## 2016-12-24 ENCOUNTER — Emergency Department (HOSPITAL_COMMUNITY)
Admission: EM | Admit: 2016-12-24 | Discharge: 2016-12-25 | Disposition: A | Discharge status: Home / Self Care | Payer: Self-pay | Attending: Emergency Medicine | Admitting: Emergency Medicine

## 2016-12-24 ENCOUNTER — Encounter (HOSPITAL_COMMUNITY): Payer: Self-pay

## 2016-12-24 DIAGNOSIS — S90829A Blister (nonthermal), unspecified foot, initial encounter: Secondary | ICD-10-CM | POA: Insufficient documentation

## 2016-12-24 DIAGNOSIS — F17210 Nicotine dependence, cigarettes, uncomplicated: Secondary | ICD-10-CM | POA: Insufficient documentation

## 2016-12-24 DIAGNOSIS — X58XXXA Exposure to other specified factors, initial encounter: Secondary | ICD-10-CM | POA: Insufficient documentation

## 2016-12-24 DIAGNOSIS — Y999 Unspecified external cause status: Secondary | ICD-10-CM | POA: Insufficient documentation

## 2016-12-24 DIAGNOSIS — Y929 Unspecified place or not applicable: Secondary | ICD-10-CM | POA: Insufficient documentation

## 2016-12-24 DIAGNOSIS — J45909 Unspecified asthma, uncomplicated: Secondary | ICD-10-CM | POA: Insufficient documentation

## 2016-12-24 DIAGNOSIS — Y939 Activity, unspecified: Secondary | ICD-10-CM | POA: Insufficient documentation

## 2016-12-24 MED ORDER — IBUPROFEN 600 MG PO TABS: 600.0000 mg | ORAL_TABLET | Freq: Four times a day (QID) | ORAL | 0 refills | Status: DC | PRN

## 2016-12-24 MED ORDER — IBUPROFEN 200 MG PO TABS
600.0000 mg | ORAL_TABLET | Freq: Once | ORAL | Status: AC
  Administered 2016-12-25: 600 mg via ORAL
  Filled 2016-12-24: qty 3

## 2016-12-24 NOTE — ED Triage Notes (Signed)
Pt presents with c/o blisters on both feet. Pt reports he has athletes feet. Pt does have a strong odor coming from both of his feet. Ambulatory to triage.

## 2016-12-24 NOTE — ED Provider Notes (Signed)
[facility]-EMERGENCY DEPT Provider Note   CSN: [id number] Arrival date & time: 12/24/16  2114     History   Chief Complaint Chief Complaint  Patient presents with  . Blisters on feet    HPI Dustin Norris is a 41 y.o. male.  Patient presents with complaint of pain and swelling of both feet x 2-3 days. He states he has painful blisters to the bottom of the feet and discoloration and pain across the top of the toes. Symptoms affect both feet equally. No ankle or calf pain. No fever, opened or draining wound. He admits that he is homeless and walks most of the day.    The history is provided by the patient. No language interpreter was used.    Past Medical History:  Diagnosis Date  . Anxiety   . Asthma   . Cocaine abuse (HCC)   . Homelessness   . Migraine   . Tobacco abuse     Patient Active Problem List   Diagnosis Date Noted  . 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 Cocaine use disorder, moderate, dependence (HCC) 07/26/2016  . 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 Psychosis (HCC) 07/24/2016  . Chest pain 11/23/2015  . Asthma 11/23/2015  . Tobacco abuse 11/23/2015  . Elevated lactic acid level 11/23/2015  . Cocaine abuse (HCC)   . Concern about STD in male without diagnosis   . Paranoid ideation (HCC) 02/22/2015    History reviewed. No pertinent surgical history.     Home Medications    Prior to Admission medications   Medication Sig Start Date End Date Taking? Authorizing Provider  acetaminophen (TYLENOL) 500 MG tablet Take 500 mg by mouth every 6 (six) hours as needed for moderate pain.     [provider]  albuterol (PROVENTIL HFA;VENTOLIN HFA) 108 (90 Base) MCG/ACT inhaler Inhale 2 puffs into the lungs every 4 (four) hours as needed for wheezing or shortness of breath. 07/31/16   Muthersbaugh, Dahlia ClientHannah, PA-C  hydrOXYzine (ATARAX/VISTARIL) 25 MG tablet Take 1  tablet (25 mg total) by mouth 3 (three) times daily as needed for anxiety. 07/28/16   Denzil Magnusonhomas, Lashunda, NP  ondansetron (ZOFRAN) 8 MG tablet Take 1 tablet (8 mg total) by mouth every 8 (eight) hours as needed for nausea or vomiting. 08/03/16   Street, [place], PA-C  Spacer/Aero-Holding Chambers (AEROCHAMBER PLUS WITH MASK) inhaler Use as instructed 07/31/16   Muthersbaugh, Dahlia ClientHannah, PA-C  traZODone (DESYREL) 50 MG tablet Take 1 tablet (50 mg total) by mouth at bedtime as needed for sleep. For insomnia 07/28/16   Denzil Magnusonhomas, Lashunda, NP    Family History History reviewed. No pertinent family history.  Social History Social History   Tobacco Use  . Smoking status: Current Every Day Smoker    Packs/day: 1.00    Years: 0.00    Pack years: 0.00    Types: Cigarettes, Cigars  . Smokeless tobacco: Never Used  Substance Use Topics  . Alcohol use: No  . Drug use: No    Comment: last use October 2017     Allergies   Haldol [haloperidol lactate]   Review of Systems Review of Systems  Constitutional: Negative for chills and fever.  Musculoskeletal:       See HPI  Skin: Positive for color change.  Neurological: Negative.  Negative for weakness and numbness.     Physical Exam Updated Vital Signs BP 134/83 (BP Location: Right Arm)   Pulse 90  Temp (!) 97.3 F (36.3 C) (Oral)   Resp 18   Ht 5\' 7"  (1.702 m)   Wt 73 kg (161 lb)   SpO2 99%   BMI 25.22 kg/m   Physical Exam  Constitutional: He is oriented to person, place, and time. He appears well-developed and well-nourished. No distress.  Neck: Normal range of motion.  Pulmonary/Chest: Effort normal.  Musculoskeletal: Normal range of motion.  There are intact blisters to bilateral plantar forefoot. No redness or draining. Tender. Dorsal toes are hyperpigmented without swelling, ulceration, bleeding. No interphalangeal rash, scaling or discharge. No ankle or calf tenderness.  Neurological: He is alert and oriented to person, place, and  time.  Skin: Skin is warm and dry.  Psychiatric: He has a normal mood and affect.     ED Treatments / Results  Labs (all labs ordered are listed, but only abnormal results are displayed) Labs Reviewed - No data to display  EKG  EKG Interpretation None       Radiology No results found.  Procedures Procedures (including critical care time)  Medications Ordered in ED Medications - No data to display   Initial Impression / Assessment and Plan / ED Course  I have reviewed the triage vital signs and the nursing notes.  Pertinent labs & imaging results that were available during my care of the patient were reviewed by me and considered in my medical decision making (see chart for details).     Patient presents with painful blisters to bilateral feet from excessive walking. He now has new shoes and socks which should help keep the area clean. Feet have been cleaned in the ED and blisters have been bandaged with padded gauze. Recommended resting the feet, cleaning 2-3 times daily and ibuprofen  Final Clinical Impressions(s) / ED Diagnoses   Final diagnoses:  None   1. Plantar blisters  ED Discharge Orders    None       Elpidio AnisUpstill, Theodora Lalanne, PA-C 12/24/16 2333    Alvira MondaySchlossman, Erin, MD 12/25/16 [phone]

## 2016-12-26 ENCOUNTER — Encounter (HOSPITAL_COMMUNITY): Payer: Self-pay | Admitting: Emergency Medicine

## 2016-12-26 ENCOUNTER — Emergency Department (HOSPITAL_COMMUNITY)
Admission: EM | Admit: 2016-12-26 | Discharge: 2016-12-26 | Disposition: A | Discharge status: Home / Self Care | Payer: Self-pay | Attending: Emergency Medicine | Admitting: Emergency Medicine

## 2016-12-26 DIAGNOSIS — B353 Tinea pedis: Secondary | ICD-10-CM | POA: Insufficient documentation

## 2016-12-26 DIAGNOSIS — M79672 Pain in left foot: Secondary | ICD-10-CM | POA: Insufficient documentation

## 2016-12-26 DIAGNOSIS — F17210 Nicotine dependence, cigarettes, uncomplicated: Secondary | ICD-10-CM | POA: Insufficient documentation

## 2016-12-26 DIAGNOSIS — Z590 Homelessness: Secondary | ICD-10-CM | POA: Insufficient documentation

## 2016-12-26 DIAGNOSIS — Z79899 Other long term (current) drug therapy: Secondary | ICD-10-CM | POA: Insufficient documentation

## 2016-12-26 DIAGNOSIS — J45909 Unspecified asthma, uncomplicated: Secondary | ICD-10-CM | POA: Insufficient documentation

## 2016-12-26 DIAGNOSIS — L539 Erythematous condition, unspecified: Secondary | ICD-10-CM | POA: Insufficient documentation

## 2016-12-26 MED ORDER — CLOTRIMAZOLE 1 % EX CREA
TOPICAL_CREAM | CUTANEOUS | 0 refills | Status: DC
Start: 2016-12-26 — End: 2017-01-05

## 2016-12-26 NOTE — Discharge Instructions (Signed)
Please read the attached information regarding your condition. Apply Lotrimin cream to affected areas. Keep area dry and clean. Return to ED for injuries, falls, redness of feet or fevers.

## 2016-12-26 NOTE — ED Triage Notes (Signed)
Patient c/o athletes foot to bilateral feet. Pt states he was seen a 2 days ago and soaked his feet but is now requesting powder or a medication for them .

## 2016-12-26 NOTE — ED Provider Notes (Signed)
[facility]-EMERGENCY DEPT Provider Note   CSN: [id number] Arrival date & time: 12/26/16  1345     History   Chief Complaint Chief Complaint  Patient presents with  . Recurrent Skin Infections    HPI Dustin Norris is a 41 y.o. male with a past medical history of homelessness, anxiety, substance abuse, who presents to ED for evaluation of blisters and foul odor to bilateral feet.  He was seen here in the emergency room 2 days ago for similar symptoms but states that despite him washing his feet with soap and water he continues to have the blisters and odor.  He has not tried any lotions or creams to help with symptoms.  He denies any injuries, drainage from feet, wounds, numbness, trouble with ambulation.  HPI  Past Medical History:  Diagnosis Date  . Anxiety   . Asthma   . Cocaine abuse (HCC)   . Homelessness   . Migraine   . Tobacco abuse     Patient Active Problem List   Diagnosis Date Noted  . 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 Cocaine use disorder, moderate, dependence (HCC) 07/26/2016  . 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 Psychosis (HCC) 07/24/2016  . Chest pain 11/23/2015  . Asthma 11/23/2015  . Tobacco abuse 11/23/2015  . Elevated lactic acid level 11/23/2015  . Cocaine abuse (HCC)   . Concern about STD in male without diagnosis   . Paranoid ideation (HCC) 02/22/2015    History reviewed. No pertinent surgical history.     Home Medications    Prior to Admission medications   Medication Sig Start Date End Date Taking? Authorizing Provider  acetaminophen (TYLENOL) 500 MG tablet Take 500 mg by mouth every 6 (six) hours as needed for moderate pain.     [provider]  albuterol (PROVENTIL HFA;VENTOLIN HFA) 108 (90 Base) MCG/ACT inhaler Inhale 2 puffs into the lungs every 4 (four) hours as needed for wheezing or shortness of breath. 07/31/16   Muthersbaugh,  Dahlia ClientHannah, PA-C  clotrimazole (LOTRIMIN) 1 % cream Apply to affected area 2 times daily 12/26/16   Marcellino Fidalgo, PA-C  hydrOXYzine (ATARAX/VISTARIL) 25 MG tablet Take 1 tablet (25 mg total) by mouth 3 (three) times daily as needed for anxiety. 07/28/16   Denzil Magnusonhomas, Lashunda, NP  ibuprofen (ADVIL,MOTRIN) 600 MG tablet Take 1 tablet (600 mg total) by mouth every 6 (six) hours as needed. 12/24/16   Elpidio AnisUpstill, Shari, PA-C  ondansetron (ZOFRAN) 8 MG tablet Take 1 tablet (8 mg total) by mouth every 8 (eight) hours as needed for nausea or vomiting. 08/03/16   Street, LafeMercedes, PA-C  Spacer/Aero-Holding Chambers (AEROCHAMBER PLUS WITH MASK) inhaler Use as instructed 07/31/16   Muthersbaugh, Dahlia ClientHannah, PA-C  traZODone (DESYREL) 50 MG tablet Take 1 tablet (50 mg total) by mouth at bedtime as needed for sleep. For insomnia 07/28/16   Denzil Magnusonhomas, Lashunda, NP    Family History No family history on file.  Social History Social History   Tobacco Use  . Smoking status: Current Every Day Smoker    Packs/day: 1.00    Years: 0.00    Pack years: 0.00    Types: Cigarettes, Cigars  . Smokeless tobacco: Never Used  Substance Use Topics  . Alcohol use: No  . Drug use: No    Comment: last use October 2017     Allergies   Haldol [haloperidol lactate]   Review of Systems Review of Systems  Constitutional: Negative for  chills and fever.  Musculoskeletal: Negative for arthralgias, gait problem and myalgias.  Skin: Positive for color change and rash. Negative for wound.     Physical Exam Updated Vital Signs BP (!) 143/100 (BP Location: Right Arm)   Pulse 80   Temp 98.1 F (36.7 C) (Oral)   Resp 16   SpO2 100%   Physical Exam  Constitutional: He appears well-developed and well-nourished. No distress.  Nontoxic appearing and in no acute distress.  Disheveled appearance noted.  HENT:  Head: Normocephalic and atraumatic.  Eyes: Conjunctivae and EOM are normal. No scleral icterus.  Neck: Normal range of motion.    Pulmonary/Chest: Effort normal. No respiratory distress.  Neurological: He is alert.  Skin: Rash noted. He is not diaphoretic. There is erythema.  Blisters and crusting noted in bilateral feet on plantar aspect.  No redness, draining, swelling or ulceration noted.  There is no open wounds noted.  There is tenderness to palpation diffusely of bilateral toes.  No calf tenderness or ankle tenderness or changes in range of motion of digits or ankle.  Psychiatric: He has a normal mood and affect.  Nursing note and vitals reviewed.    ED Treatments / Results  Labs (all labs ordered are listed, but only abnormal results are displayed) Labs Reviewed - No data to display  EKG  EKG Interpretation None       Radiology No results found.  Procedures Procedures (including critical care time)  Medications Ordered in ED Medications - No data to display   Initial Impression / Assessment and Plan / ED Course  I have reviewed the triage vital signs and the nursing notes.  Pertinent labs & imaging results that were available during my care of the patient were reviewed by me and considered in my medical decision making (see chart for details).     Patient presents to ED for evaluation of blisters to bilateral feet that he states are painful and have a foul odor.  He states that he does walk a lot.  Patient is homeless which she admits to later on during my examination.  He does want a cream to help with his symptoms of possible athlete's foot.  There is some discoloration and crusting noted in bilateral feet.  Symptoms could be due to athlete's foot or poor hygiene.  Patient allowed to air his feet out here I did advise him to keep the area dry.  Will give Lotrimin to be used and advised keeping area clean with soap and water.  Patient given food and drink here in the ED.  Strict return precautions given.  Final Clinical Impressions(s) / ED Diagnoses   Final diagnoses:  Tinea pedis of both  feet    ED Discharge Orders        Ordered    clotrimazole (LOTRIMIN) 1 % cream     12/26/16 1612       Dietrich PatesKhatri, Sutton Hirsch, PA-C 12/26/16 1613    Nira Connardama, Pedro Eduardo, MD 12/26/16 1704

## 2016-12-26 NOTE — ED Notes (Signed)
Pt is homeless and new to the area pt stated.

## 2016-12-30 ENCOUNTER — Encounter (HOSPITAL_COMMUNITY): Payer: Self-pay | Admitting: Emergency Medicine

## 2016-12-30 ENCOUNTER — Emergency Department (HOSPITAL_COMMUNITY)
Admission: EM | Admit: 2016-12-30 | Discharge: 2016-12-30 | Disposition: A | Discharge status: Home / Self Care | Payer: Self-pay | Attending: Emergency Medicine | Admitting: Emergency Medicine

## 2016-12-30 DIAGNOSIS — F17210 Nicotine dependence, cigarettes, uncomplicated: Secondary | ICD-10-CM | POA: Insufficient documentation

## 2016-12-30 DIAGNOSIS — Z590 Homelessness: Secondary | ICD-10-CM | POA: Insufficient documentation

## 2016-12-30 DIAGNOSIS — J45909 Unspecified asthma, uncomplicated: Secondary | ICD-10-CM | POA: Insufficient documentation

## 2016-12-30 DIAGNOSIS — B353 Tinea pedis: Secondary | ICD-10-CM | POA: Insufficient documentation

## 2016-12-30 DIAGNOSIS — Z79899 Other long term (current) drug therapy: Secondary | ICD-10-CM | POA: Insufficient documentation

## 2016-12-30 MED ORDER — NYSTATIN 100000 UNIT/GM EX POWD: Freq: Two times a day (BID) | CUTANEOUS | Status: DC

## 2016-12-30 MED ORDER — NYSTATIN 100000 UNIT/GM EX POWD: CUTANEOUS | 0 refills | Status: DC

## 2016-12-30 MED ORDER — TOLNAFTATE 1 % EX POWD
Freq: Two times a day (BID) | CUTANEOUS | Status: DC
  Administered 2016-12-30: 22:00:00 via TOPICAL
  Filled 2016-12-30: qty 45

## 2016-12-30 NOTE — Discharge Instructions (Addendum)
Please allow your feet to dry.  Follow-up with the podiatrist if symptoms not improving.  Use over-the-counter baby powder or [employer] athlete's foot.

## 2016-12-30 NOTE — ED Provider Notes (Signed)
[facility]-EMERGENCY DEPT Provider Note   CSN: [id number] Arrival date & time: 12/30/16  1621     History   Chief Complaint Chief Complaint  Patient presents with  . Rash    HPI Dustin Norris is a 41 y.o. male.  HPI 41 year old African-American male with no pertinent past medical history presents to the emergency department today for evaluation of odor to bilateral feet, blisters, sweating.  Patient was seen in the emergency room 2 times in the past week for similar symptoms.  Patient was given Lotrimin cream last ED visit but states has not helped.  Patient states that his feet continue to be moist.  Patient is homeless and has been walking a significant amount.  Patient states that he works however he cannot afford any over-the-counter medications until he gets his paycheck.  The patient states he has not really been able to air his feet out due to the cold and being homeless.  He reports continued foul odor and blisters to his feet.  Patient denies any injuries, drainage from feet, wound, numbness, trouble with ambulation, fever, chills, redness.  Patient does report associated itching of his bilateral lower feet. Past Medical History:  Diagnosis Date  . Anxiety   . Asthma   . Cocaine abuse (HCC)   . Homelessness   . Migraine   . Tobacco abuse     Patient Active Problem List   Diagnosis Date Noted  . 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 Cocaine use disorder, moderate, dependence (HCC) 07/26/2016  . 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 Psychosis (HCC) 07/24/2016  . Chest pain 11/23/2015  . Asthma 11/23/2015  . Tobacco abuse 11/23/2015  . Elevated lactic acid level 11/23/2015  . Cocaine abuse (HCC)   . Concern about STD in male without diagnosis   . Paranoid ideation (HCC) 02/22/2015    History reviewed. No pertinent surgical history.     Home Medications    Prior to  Admission medications   Medication Sig Start Date End Date Taking? Authorizing Provider  acetaminophen (TYLENOL) 500 MG tablet Take 500 mg by mouth every 6 (six) hours as needed for moderate pain.     [provider]  albuterol (PROVENTIL HFA;VENTOLIN HFA) 108 (90 Base) MCG/ACT inhaler Inhale 2 puffs into the lungs every 4 (four) hours as needed for wheezing or shortness of breath. 07/31/16   Muthersbaugh, Dahlia ClientHannah, PA-C  clotrimazole (LOTRIMIN) 1 % cream Apply to affected area 2 times daily 12/26/16   Khatri, Hina, PA-C  hydrOXYzine (ATARAX/VISTARIL) 25 MG tablet Take 1 tablet (25 mg total) by mouth 3 (three) times daily as needed for anxiety. 07/28/16   Denzil Magnusonhomas, Lashunda, NP  ibuprofen (ADVIL,MOTRIN) 600 MG tablet Take 1 tablet (600 mg total) by mouth every 6 (six) hours as needed. 12/24/16   Elpidio AnisUpstill, Shari, PA-C  ondansetron (ZOFRAN) 8 MG tablet Take 1 tablet (8 mg total) by mouth every 8 (eight) hours as needed for nausea or vomiting. 08/03/16   Street, [place]cedes, PA-C  Spacer/Aero-Holding Chambers (AEROCHAMBER PLUS WITH MASK) inhaler Use as instructed 07/31/16   Muthersbaugh, Dahlia ClientHannah, PA-C  traZODone (DESYREL) 50 MG tablet Take 1 tablet (50 mg total) by mouth at bedtime as needed for sleep. For insomnia 07/28/16   Denzil Magnusonhomas, Lashunda, NP    Family History No family history on file.  Social History Social History   Tobacco Use  . Smoking status: Current Every Day Smoker    Packs/day: 1.00  Years: 0.00    Pack years: 0.00    Types: Cigarettes, Cigars  . Smokeless tobacco: Never Used  Substance Use Topics  . Alcohol use: No  . Drug use: No    Comment: last use October 2017     Allergies   Haldol [haloperidol lactate]   Review of Systems Review of Systems  Constitutional: Negative for chills and fever.  Gastrointestinal: Negative for nausea and vomiting.  Musculoskeletal: Negative for myalgias.  Skin: Positive for rash.  Neurological: Negative for weakness and numbness.      Physical Exam Updated Vital Signs BP (!) 140/92 (BP Location: Right Arm)   Pulse (!) 103   Temp 99.1 F (37.3 C) (Oral)   Resp 20   SpO2 99%   Physical Exam  Constitutional: He appears well-developed and well-nourished. No distress.  HENT:  Head: Normocephalic and atraumatic.  Eyes: Right eye exhibits no discharge. Left eye exhibits no discharge. No scleral icterus.  Neck: Normal range of motion.  Pulmonary/Chest: No respiratory distress.  Musculoskeletal: Normal range of motion.  Neurological: He is alert.  Skin: Skin is warm and dry. Capillary refill takes less than 2 seconds. No pallor.  Macerated skin to the plantar aspect of the bilateral feet..  No redness, draining, swelling or ulceration noted.  There is no open wounds noted.  There is no tenderness to palpation the entire foot bilaterally.  Malodorous feet bilaterally.  Poor hygiene with long toenails noted.  No ingrown nails noted..  No calf tenderness or ankle tenderness or changes in range of motion of digits or ankle.   Psychiatric: His behavior is normal. Judgment and thought content normal.  Nursing note and vitals reviewed.    ED Treatments / Results  Labs (all labs ordered are listed, but only abnormal results are displayed) Labs Reviewed - No data to display  EKG  EKG Interpretation None       Radiology No results found.  Procedures Procedures (including critical care time)  Medications Ordered in ED Medications  nystatin (MYCOSTATIN/NYSTOP) topical powder (not administered)     Initial Impression / Assessment and Plan / ED Course  I have reviewed the triage vital signs and the nursing notes.  Pertinent labs & imaging results that were available during my care of the patient were reviewed by me and considered in my medical decision making (see chart for details).     Patient presents to the ED for ongoing evaluation of foul-smelling feet, itchiness, blisters to plantar region of his  feet.  Patient has been seen several times in the ED for same in the past week.  Patient states that he is homeless and walks a lot.  Patient did try some Lotrimin cream that he was given last ED visit that it has not really helped.  On examination patient has significant moisture and odor to his bilateral feet.  No signs of infection given there is no erythema, purulent drainage, wounds.  Feet are very macerated.  Patient cannot afford any over-the-counter topical powders.  Patient symptoms seem consistent with athlete's foot.  Discussed with pharmacy.  They state that tinactin is what we have in stock in the hospital.  Will prescribe this for him.  Encourage podiatry follow-up.  Patient is able to ambulate with normal gait.  Vital signs are reassuring.  Pt is hemodynamically stable, in NAD, & able to ambulate in the ED. Evaluation does not show pathology that would require ongoing emergent intervention or inpatient treatment. I explained the diagnosis  to the patient. Pain has been managed & has no complaints prior to dc. Pt is comfortable with above plan and is stable for discharge at this time. All questions were answered prior to disposition. Strict return precautions for f/u to the ED were discussed. Encouraged follow up with PCP.   Final Clinical Impressions(s) / ED Diagnoses   Final diagnoses:  Tinea pedis of both feet    ED Discharge Orders        Ordered    nystatin (MYCOSTATIN/NYSTOP) powder     12/30/16 2113       Rise Mu, PA-C 12/30/16 2114    Rise Mu, PA-C 12/30/16 2147    Bethann Berkshire, MD 12/31/16 [phone]

## 2016-12-30 NOTE — ED Triage Notes (Signed)
Patient c/o athletes foot on bilateral feet.

## 2016-12-31 DIAGNOSIS — J45909 Unspecified asthma, uncomplicated: Secondary | ICD-10-CM | POA: Insufficient documentation

## 2016-12-31 DIAGNOSIS — R51 Headache: Secondary | ICD-10-CM | POA: Insufficient documentation

## 2016-12-31 DIAGNOSIS — F17210 Nicotine dependence, cigarettes, uncomplicated: Secondary | ICD-10-CM | POA: Insufficient documentation

## 2016-12-31 NOTE — ED Notes (Addendum)
Pt sleeping in lobby everyday for the past week. Was asked by security this afternoon to leave the property if he wasn't going to check in. Pt went outside for awhile but returned and sleeping in lobby. Pt asked again to leave or check in, pt has checked in again.

## 2017-01-01 ENCOUNTER — Other Ambulatory Visit: Payer: Self-pay

## 2017-01-01 ENCOUNTER — Emergency Department (HOSPITAL_COMMUNITY)
Admission: EM | Admit: 2017-01-01 | Discharge: 2017-01-01 | Disposition: A | Discharge status: Home / Self Care | Payer: Self-pay | Attending: Emergency Medicine | Admitting: Emergency Medicine

## 2017-01-01 ENCOUNTER — Encounter (HOSPITAL_COMMUNITY): Payer: Self-pay

## 2017-01-01 DIAGNOSIS — R519 Headache, unspecified: Secondary | ICD-10-CM

## 2017-01-01 DIAGNOSIS — R51 Headache: Secondary | ICD-10-CM

## 2017-01-01 MED ORDER — ACETAMINOPHEN 325 MG PO TABS
650.0000 mg | ORAL_TABLET | Freq: Once | ORAL | Status: AC
  Administered 2017-01-01: 650 mg via ORAL
  Filled 2017-01-01: qty 2

## 2017-01-01 NOTE — ED Notes (Signed)
When pt was called for bed pt was found to be sleeping in lobby with shoes off.

## 2017-01-01 NOTE — ED Provider Notes (Signed)
[facility]-EMERGENCY DEPT Provider Note   CSN: [id number] Arrival date & time: 12/31/16  2309     History   Chief Complaint Chief Complaint  Patient presents with  . Headache    HPI Dustin Norris is a 41 y.o. male.  HPI   Patient presents for the second time in 24 hours, and for the 17th time in 6 months. He now notes that over the past 3 days he has had a headache. No new numbness anywhere, no new confusion, disorientation, vomiting, vision changes. He has not taken any medication for relief. He has also not discussed his headache in spite of being evaluated here within the past 24 hours for other issues. Patient is homeless, though hesitant to discuss any details of his social situation.   Past Medical History:  Diagnosis Date  . Anxiety   . Asthma   . Cocaine abuse (HCC)   . Homelessness   . Migraine   . Tobacco abuse     Patient Active Problem List   Diagnosis Date Noted  . 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 Cocaine use disorder, moderate, dependence (HCC) 07/26/2016  . 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 Psychosis (HCC) 07/24/2016  . Chest pain 11/23/2015  . Asthma 11/23/2015  . Tobacco abuse 11/23/2015  . Elevated lactic acid level 11/23/2015  . Cocaine abuse (HCC)   . Concern about STD in male without diagnosis   . Paranoid ideation (HCC) 02/22/2015    History reviewed. No pertinent surgical history.     Home Medications    Prior to Admission medications   Medication Sig Start Date End Date Taking? Authorizing Provider  acetaminophen (TYLENOL) 500 MG tablet Take 500 mg by mouth every 6 (six) hours as needed for moderate pain.     [provider]  albuterol (PROVENTIL HFA;VENTOLIN HFA) 108 (90 Base) MCG/ACT inhaler Inhale 2 puffs into the lungs every 4 (four) hours as needed for wheezing or shortness of breath. 07/31/16   Muthersbaugh, Dahlia ClientHannah,  PA-C  clotrimazole (LOTRIMIN) 1 % cream Apply to affected area 2 times daily 12/26/16   Khatri, Hina, PA-C  hydrOXYzine (ATARAX/VISTARIL) 25 MG tablet Take 1 tablet (25 mg total) by mouth 3 (three) times daily as needed for anxiety. 07/28/16   Denzil Magnusonhomas, Lashunda, NP  ibuprofen (ADVIL,MOTRIN) 600 MG tablet Take 1 tablet (600 mg total) by mouth every 6 (six) hours as needed. 12/24/16   Elpidio AnisUpstill, Shari, PA-C  ondansetron (ZOFRAN) 8 MG tablet Take 1 tablet (8 mg total) by mouth every 8 (eight) hours as needed for nausea or vomiting. 08/03/16   Street, Prine[place]Mercedes, PA-C  Spacer/Aero-Holding Chambers (AEROCHAMBER PLUS WITH MASK) inhaler Use as instructed 07/31/16   Muthersbaugh, Dahlia ClientHannah, PA-C  traZODone (DESYREL) 50 MG tablet Take 1 tablet (50 mg total) by mouth at bedtime as needed for sleep. For insomnia 07/28/16   Denzil Magnusonhomas, Lashunda, NP    Family History History reviewed. No pertinent family history.  Social History Social History   Tobacco Use  . Smoking status: Current Every Day Smoker    Packs/day: 1.00    Years: 0.00    Pack years: 0.00    Types: Cigarettes, Cigars  . Smokeless tobacco: Never Used  Substance Use Topics  . Alcohol use: No  . Drug use: No    Comment: last use October 2017     Allergies   Haldol [haloperidol lactate]   Review of Systems Review of Systems  Constitutional:  Per HPI, otherwise negative  HENT:       Per HPI, otherwise negative  Respiratory:       Per HPI, otherwise negative  Cardiovascular:       Per HPI, otherwise negative  Gastrointestinal: Negative for vomiting.  Endocrine:       Negative aside from HPI  Genitourinary:       Neg aside from HPI   Musculoskeletal:       Per HPI, otherwise negative  Skin: Negative.   Neurological: Negative for syncope.     Physical Exam Updated Vital Signs BP 105/76   Pulse 64   Temp 97.7 F (36.5 C) (Oral)   Resp 16   Ht 5\' 7"  (1.702 m)   Wt 73 kg (161 lb)   SpO2 100%   BMI 25.22 kg/m   Physical  Exam  Constitutional: He is oriented to person, place, and time. He appears well-developed. No distress.  HENT:  Head: Normocephalic and atraumatic.  Eyes: Conjunctivae and EOM are normal.  Cardiovascular: Normal rate and regular rhythm.  Pulmonary/Chest: Effort normal. No stridor. No respiratory distress.  Abdominal: He exhibits no distension.  Musculoskeletal: He exhibits no edema.  Neurological: He is alert and oriented to person, place, and time. He displays no atrophy and no tremor. No cranial nerve deficit. He exhibits normal muscle tone. He displays no seizure activity. Coordination and gait normal.  Skin: Skin is warm and dry.  Psychiatric: He has a normal mood and affect.  Nursing note and vitals reviewed.    ED Treatments / Results   Procedures Procedures (including critical care time)  Medications Ordered in ED Medications  acetaminophen (TYLENOL) tablet 650 mg (not administered)     Initial Impression / Assessment and Plan / ED Course  I have reviewed the triage vital signs and the nursing notes.  Pertinent labs & imaging results that were available during my care of the patient were reviewed by me and considered in my medical decision making (see chart for details).  Patient presents with concern of ongoing headache, but no neurologic abnormalities, he is awake, alert, in no distress. Given the duration of headache, absence of other neurologic findings, low suspicion for acute intracranial pathology. Patient provided Tylenol, as well as resources for his homelessness, discharged in stable condition.  Final Clinical Impressions(s) / ED Diagnoses   Final diagnoses:  Bad headache     Gerhard MunchLockwood, Tallen Schnorr, MD 01/01/17 [phone]

## 2017-01-01 NOTE — Discharge Instructions (Signed)
As discussed, your evaluation today has been largely reassuring.  But, it is important that you monitor your condition carefully, and do not hesitate to return to the ED if you develop new, or concerning changes in your condition.  Otherwise, please follow-up with your physician for appropriate ongoing care.  HOMELESS SHELTERS  [facility]     [facility]   [address], [place]     [phone]              [facility] (women and children)       [address] [place], [place] [address] [phone] [email or web address] for application and process Application Required  [facility]   [address]    [place] [address]     [phone]                    [facility] [address] [place], [place] [address] [phone](schedule application appt.) Application Required  [facility] (women only)    [address]     [place], [place] [address]     [phone]      Intake starts 6pm daily Need valid ID, SSC, & Police report [facility] [place] [address] [place], [place] [phone] Application Required  [facility] (men only)     [address]      [place], [place]     [phone]       [facility] (Pregnant women only) [address] [place], [place] [phone]  [facility]      [address]      [place], [place] [address]     [phone]             [facility] [address] [place], [place] [phone] 90 day commitment/SA/Application process  [facility](men only)     [address]     [place], [place]     [phone]       Check-in at [facility]            [facility] [address] [place], [place] [address] [phone] Men/Women/Women and Children must be there by 7 pm  [facility] [place], [place] [phone]

## 2017-01-01 NOTE — ED Triage Notes (Signed)
Pt reports a headache after he was woken up from sleeping in the lobby. Pt was discharged earlier today and escorted out with security. When security changed shifts, he came back in and went to sleep. After sleeping for 5-6 hours, he was woken up and then checked in. Pt refused to get off the cell phone throughout triage. States that head pain is in the top of his head and eyes. No vision changes. A&Ox4. Ambulatory. No distress

## 2017-01-02 ENCOUNTER — Other Ambulatory Visit: Payer: Self-pay

## 2017-01-02 ENCOUNTER — Emergency Department (HOSPITAL_COMMUNITY)
Admission: EM | Admit: 2017-01-02 | Discharge: 2017-01-02 | Disposition: A | Discharge status: Home / Self Care | Payer: Self-pay | Attending: Emergency Medicine | Admitting: Emergency Medicine

## 2017-01-02 ENCOUNTER — Encounter (HOSPITAL_COMMUNITY): Payer: Self-pay | Admitting: Emergency Medicine

## 2017-01-02 DIAGNOSIS — J45909 Unspecified asthma, uncomplicated: Secondary | ICD-10-CM | POA: Insufficient documentation

## 2017-01-02 DIAGNOSIS — R51 Headache: Secondary | ICD-10-CM | POA: Insufficient documentation

## 2017-01-02 DIAGNOSIS — F17210 Nicotine dependence, cigarettes, uncomplicated: Secondary | ICD-10-CM | POA: Insufficient documentation

## 2017-01-02 DIAGNOSIS — R519 Headache, unspecified: Secondary | ICD-10-CM

## 2017-01-02 MED ORDER — ACETAMINOPHEN 325 MG PO TABS
650.0000 mg | ORAL_TABLET | Freq: Once | ORAL | Status: AC
  Administered 2017-01-02: 650 mg via ORAL
  Filled 2017-01-02: qty 2

## 2017-01-02 MED ORDER — IBUPROFEN 200 MG PO TABS
600.0000 mg | ORAL_TABLET | Freq: Once | ORAL | Status: AC
  Administered 2017-01-02: 600 mg via ORAL
  Filled 2017-01-02: qty 3

## 2017-01-02 NOTE — ED Notes (Signed)
After checking the pt vitals this writer told him to wait in the triage room to be triaged he then walked back to the lobby stating that he needed to charge his phone

## 2017-01-02 NOTE — Discharge Instructions (Signed)
It was my pleasure taking care of you today!   Please follow up with a primary care doctor. If you do not have one, see the information below.   Return to ER for new symptoms or additional concerns.

## 2017-01-02 NOTE — ED Notes (Addendum)
Pt does not want an IV.  Pt also seems extremely agitated and stressed.

## 2017-01-02 NOTE — ED Provider Notes (Signed)
[facility]-EMER[facility] DEPT Provider Note   CSN: [id number] Arrival date & time: 01/02/17  1654     History   Chief Complaint Chief Complaint  Patient presents with  . Migraine    HPI Dustin Norris is a 41 y.o. male.  The history is provided by the patient and medical records. No language interpreter was used.  Migraine  Associated symptoms include headaches.  Dustin Norris is a 41 y.o. male  with a PMH of asthma, anxiety, migraines, substance abuse who presents to the Emergency Department complaining of a constant, persistent "stress headache" which began today. Patient states that he has too much going on in his life and this has given him a headache. Hx of similar. Pain is across forehead. Worse with bright lights. No medications taken prior to arrival for symptoms. No alleviating factors noted. Denies visual changes, neck pain, fever/chills, abdominal pain, n/v.    Past Medical History:  Diagnosis Date  . Anxiety   . Asthma   . Cocaine abuse (HCC)   . Homelessness   . Migraine   . Tobacco abuse     Patient Active Problem List   Diagnosis Date Noted  . 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 Cocaine use disorder, moderate, dependence (HCC) 07/26/2016  . 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 Psychosis (HCC) 07/24/2016  . Chest pain 11/23/2015  . Asthma 11/23/2015  . Tobacco abuse 11/23/2015  . Elevated lactic acid level 11/23/2015  . Cocaine abuse (HCC)   . Concern about STD in male without diagnosis   . Paranoid ideation (HCC) 02/22/2015    History reviewed. No pertinent surgical history.     Home Medications    Prior to Admission medications   Medication Sig Start Date End Date Taking? Authorizing Provider  acetaminophen (TYLENOL) 500 MG tablet Take 500 mg by mouth every 6 (six) hours as needed for moderate pain.     [provider]  albuterol (PROVENTIL  HFA;VENTOLIN HFA) 108 (90 Base) MCG/ACT inhaler Inhale 2 puffs into the lungs every 4 (four) hours as needed for wheezing or shortness of breath. 07/31/16   Muthersbaugh, Dahlia ClientHannah, PA-C  clotrimazole (LOTRIMIN) 1 % cream Apply to affected area 2 times daily 12/26/16   Khatri, Hina, PA-C  hydrOXYzine (ATARAX/VISTARIL) 25 MG tablet Take 1 tablet (25 mg total) by mouth 3 (three) times daily as needed for anxiety. 07/28/16   Denzil Magnusonhomas, Lashunda, NP  ibuprofen (ADVIL,MOTRIN) 600 MG tablet Take 1 tablet (600 mg total) by mouth every 6 (six) hours as needed. 12/24/16   Elpidio AnisUpstill, Shari, PA-C  ondansetron (ZOFRAN) 8 MG tablet Take 1 tablet (8 mg total) by mouth every 8 (eight) hours as needed for nausea or vomiting. 08/03/16   Street, [place]cedes, PA-C  Spacer/Aero-Holding Chambers (AEROCHAMBER PLUS WITH MASK) inhaler Use as instructed 07/31/16   Muthersbaugh, Dahlia ClientHannah, PA-C  traZODone (DESYREL) 50 MG tablet Take 1 tablet (50 mg total) by mouth at bedtime as needed for sleep. For insomnia 07/28/16   Denzil Magnusonhomas, Lashunda, NP    Family History History reviewed. No pertinent family history.  Social History Social History   Tobacco Use  . Smoking status: Current Every Day Smoker    Packs/day: 1.00    Years: 0.00    Pack years: 0.00    Types: Cigarettes, Cigars  . Smokeless tobacco: Never Used  Substance Use Topics  . Alcohol use: No  . Drug use: No    Comment: last use October 2017  Allergies   Haldol [haloperidol lactate]   Review of Systems Review of Systems  Eyes: Positive for photophobia.  Neurological: Positive for headaches. Negative for dizziness, syncope, speech difficulty, weakness, light-headedness and numbness.  All other systems reviewed and are negative.    Physical Exam Updated Vital Signs BP (!) 145/88 (BP Location: Left Arm)   Pulse 83   Temp 98.5 F (36.9 C) (Oral)   Resp 16   SpO2 99%   Physical Exam  Constitutional: He is oriented to person, place, and time. He appears  well-developed and well-nourished. No distress.  HENT:  Head: Normocephalic and atraumatic.  Mouth/Throat: Oropharynx is clear and moist.  No tenderness of the temporal artery.  Eyes: Conjunctivae and EOM are normal. Pupils are equal, round, and reactive to light. No scleral icterus.  No nystagmus.  Neck: Normal range of motion. Neck supple.  Full active and passive ROM without pain.  No midline or paraspinal tenderness. No nuchal rigidity or meningeal signs.  Cardiovascular: Normal rate, regular rhythm, normal heart sounds and intact distal pulses.  Pulmonary/Chest: Effort normal and breath sounds normal. No respiratory distress. He has no wheezes. He has no rales.  Abdominal: Soft. Bowel sounds are normal. He exhibits no distension. There is no tenderness. There is no rebound and no guarding.  Musculoskeletal: Normal range of motion.  Lymphadenopathy:    He has no cervical adenopathy.  Neurological: He is alert and oriented to person, place, and time. He has normal reflexes. No cranial nerve deficit. Coordination normal.  Alert, oriented, thought content appropriate, able to give a coherent history. Speech is clear and goal oriented, able to follow commands.  Cranial Nerves:  II:  Peripheral visual fields grossly normal, pupils equal, round, reactive to light III, IV, VI: EOM intact bilaterally, ptosis not present V,VII: smile symmetric, eyes kept closed tightly against resistance, facial light touch sensation equal VIII: hearing grossly normal IX, X: symmetric soft palate movement, uvula elevates symmetrically  XI: bilateral shoulder shrug symmetric and strong XII: midline tongue extension 5/5 muscle strength in upper and lower extremities bilaterally including strong and equal grip strength and dorsiflexion/plantar flexion Sensory to light touch normal in all four extremities.  Normal finger-to-nose and rapid alternating movements. No drift. Steady gait.  Skin: Skin is warm and dry.  No rash noted. He is not diaphoretic.  Nursing note and vitals reviewed.    ED Treatments / Results  Labs (all labs ordered are listed, but only abnormal results are displayed) Labs Reviewed - No data to display  EKG  EKG Interpretation None       Radiology No results found.  Procedures Procedures (including critical care time)  Medications Ordered in ED Medications  acetaminophen (TYLENOL) tablet 650 mg (650 mg Oral Given 01/02/17 2121)  ibuprofen (ADVIL,MOTRIN) tablet 600 mg (600 mg Oral Given 01/02/17 2121)     Initial Impression / Assessment and Plan / ED Course  I have reviewed the triage vital signs and the nursing notes.  Pertinent labs & imaging results that were available during my care of the patient were reviewed by me and considered in my medical decision making (see chart for details).    Dustin Norris is a 41 y.o. male who presents to ED for headache c/w their typical headache. No focal neuro deficits on exam. Does not want IV or IM medications. Given Sprite, sandwich, ibuprofen and tylenol with improvement. Shelter resource guide provided. PCP follow up encouraged. All questions answered.   Final Clinical Impressions(s) /  ED Diagnoses   Final diagnoses:  Bad headache    ED Discharge Orders    None       Dustin Norris, Chase PicketJaime Pilcher, PA-C 01/02/17 2251    Loren RacerYelverton, David, MD 01/11/17 1454

## 2017-01-02 NOTE — ED Triage Notes (Signed)
Pt reports HA for the past 2 days accompanied by light and sound sensitivity. No emesis.

## 2017-01-03 ENCOUNTER — Emergency Department (HOSPITAL_COMMUNITY)
Admission: EM | Admit: 2017-01-03 | Discharge: 2017-01-03 | Disposition: A | Discharge status: Home / Self Care | Payer: Self-pay | Attending: Emergency Medicine | Admitting: Emergency Medicine

## 2017-01-03 ENCOUNTER — Encounter (HOSPITAL_COMMUNITY): Payer: Self-pay | Admitting: Emergency Medicine

## 2017-01-03 ENCOUNTER — Other Ambulatory Visit: Payer: Self-pay

## 2017-01-03 DIAGNOSIS — J45909 Unspecified asthma, uncomplicated: Secondary | ICD-10-CM | POA: Insufficient documentation

## 2017-01-03 DIAGNOSIS — F17290 Nicotine dependence, other tobacco product, uncomplicated: Secondary | ICD-10-CM | POA: Insufficient documentation

## 2017-01-03 DIAGNOSIS — F17210 Nicotine dependence, cigarettes, uncomplicated: Secondary | ICD-10-CM | POA: Insufficient documentation

## 2017-01-03 DIAGNOSIS — G43009 Migraine without aura, not intractable, without status migrainosus: Secondary | ICD-10-CM | POA: Insufficient documentation

## 2017-01-03 MED ORDER — ACETAMINOPHEN 325 MG PO TABS
650.0000 mg | ORAL_TABLET | Freq: Once | ORAL | Status: AC
  Administered 2017-01-03: 650 mg via ORAL
  Filled 2017-01-03: qty 2

## 2017-01-03 NOTE — ED Triage Notes (Signed)
Patient c/o headache since getting off work today. Seen for same yesterday. Ambulatory.

## 2017-01-03 NOTE — ED Provider Notes (Signed)
[facility]EM[facility]PT Provider Note   CSN: [id number] Arrival date & time: 01/03/17  1921     History   Chief Complaint Chief Complaint  Patient presents with  . Headache    HPI Dustin Norris is a 41 y.o. male.  The history is provided by the patient. No language interpreter was used.  Headache   This is a new problem. The current episode started more than 2 days ago. The problem occurs constantly. The headache is associated with nothing. The pain is moderate. The pain does not radiate. He has tried nothing for the symptoms. The treatment provided no relief.  Pt complains of a headache   Past Medical History:  Diagnosis Date  . Anxiety   . Asthma   . Cocaine abuse (HCC)   . Homelessness   . Migraine   . Tobacco abuse     Patient Active Problem List   Diagnosis Date Noted  . 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 Cocaine use disorder, moderate, dependence (HCC) 07/26/2016  . 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 Psychosis (HCC) 07/24/2016  . Chest pain 11/23/2015  . Asthma 11/23/2015  . Tobacco abuse 11/23/2015  . Elevated lactic acid level 11/23/2015  . Cocaine abuse (HCC)   . Concern about STD in male without diagnosis   . Paranoid ideation (HCC) 02/22/2015    History reviewed. No pertinent surgical history.     Home Medications    Prior to Admission medications   Medication Sig Start Date End Date Taking? Authorizing Provider  acetaminophen (TYLENOL) 500 MG tablet Take 500 mg by mouth every 6 (six) hours as needed for moderate pain.     [provider]  albuterol (PROVENTIL HFA;VENTOLIN HFA) 108 (90 Base) MCG/ACT inhaler Inhale 2 puffs into the lungs every 4 (four) hours as needed for wheezing or shortness of breath. 07/31/16   Muthersbaugh, Dahlia ClientHannah, PA-C  clotrimazole (LOTRIMIN) 1 % cream Apply to affected area 2 times daily 12/26/16   Khatri, Hina, PA-C    hydrOXYzine (ATARAX/VISTARIL) 25 MG tablet Take 1 tablet (25 mg total) by mouth 3 (three) times daily as needed for anxiety. 07/28/16   Denzil Magnusonhomas, Lashunda, NP  ibuprofen (ADVIL,MOTRIN) 600 MG tablet Take 1 tablet (600 mg total) by mouth every 6 (six) hours as needed. 12/24/16   Elpidio AnisUpstill, Shari, PA-C  ondansetron (ZOFRAN) 8 MG tablet Take 1 tablet (8 mg total) by mouth every 8 (eight) hours as needed for nausea or vomiting. 08/03/16   Street, [place]ady[place]Mercedes, PA-C  Spacer/Aero-Holding Chambers (AEROCHAMBER PLUS WITH MASK) inhaler Use as instructed 07/31/16   Muthersbaugh, Dahlia ClientHannah, PA-C  traZODone (DESYREL) 50 MG tablet Take 1 tablet (50 mg total) by mouth at bedtime as needed for sleep. For insomnia 07/28/16   Denzil Magnusonhomas, Lashunda, NP    Family History No family history on file.  Social History Social History   Tobacco Use  . Smoking status: Current Every Day Smoker    Packs/day: 1.00    Years: 0.00    Pack years: 0.00    Types: Cigarettes, Cigars  . Smokeless tobacco: Never Used  Substance Use Topics  . Alcohol use: No  . Drug use: No    Comment: last use October 2017     Allergies   Haldol [haloperidol lactate]   Review of Systems Review of Systems  Neurological: Positive for headaches.  All other systems reviewed and are negative.    Physical Exam Updated Vital Signs BP 125/65 (  BP Location: Right Arm)   Pulse 65   Temp 98.1 F (36.7 C) (Oral)   Resp 20   Ht 5\' 7"  (1.702 m)   Wt 73 kg (161 lb)   SpO2 100%   BMI 25.22 kg/m   Physical Exam  Constitutional: He is oriented to person, place, and time. He appears well-developed and well-nourished.  HENT:  Head: Normocephalic and atraumatic.  Eyes: Pupils are equal, round, and reactive to light.  Neck: Normal range of motion.  Cardiovascular: Normal rate.  Pulmonary/Chest: Effort normal.  Abdominal: Soft.  Musculoskeletal: Normal range of motion.  Neurological: He is alert and oriented to person, place, and time. He has normal  strength.  Skin: Skin is warm.  Psychiatric: He has a normal mood and affect.  Nursing note and vitals reviewed.    ED Treatments / Results  Labs (all labs ordered are listed, but only abnormal results are displayed) Labs Reviewed - No data to display  EKG  EKG Interpretation None       Radiology No results found.  Procedures Procedures (including critical care time)  Medications Ordered in ED Medications  acetaminophen (TYLENOL) tablet 650 mg (650 mg Oral Given 01/03/17 2144)     Initial Impression / Assessment and Plan / ED Course  I have reviewed the triage vital signs and the nursing notes.  Pertinent labs & imaging results that were available during my care of the patient were reviewed by me and considered in my medical decision making (see chart for details).       Final Clinical Impressions(s) / ED Diagnoses   Final diagnoses:  Migraine without aura and without status migrainosus, not intractable    ED Discharge Orders    None    Pt given tylenol for headache.   An After Visit Summary was printed and given to the patient.    Elson AreasSofia, Leslie K, PA-C 01/03/17 2231    Pricilla LovelessGoldston, Scott, MD 01/04/17 [phone]

## 2017-01-03 NOTE — ED Notes (Signed)
Pt is alert and oriented x 4 and is verbally responsive. Pt reports that he has a generalized HA that began yesterday,  10/10 ache pain. Pt reports that he has not taken any medication for management,

## 2017-01-05 ENCOUNTER — Encounter (HOSPITAL_COMMUNITY): Payer: Self-pay

## 2017-01-05 ENCOUNTER — Other Ambulatory Visit: Payer: Self-pay

## 2017-01-05 ENCOUNTER — Emergency Department (HOSPITAL_COMMUNITY)
Admission: EM | Admit: 2017-01-05 | Discharge: 2017-01-05 | Disposition: A | Discharge status: Home / Self Care | Payer: Self-pay | Attending: Emergency Medicine | Admitting: Emergency Medicine

## 2017-01-05 ENCOUNTER — Emergency Department (HOSPITAL_COMMUNITY)
Admission: EM | Admit: 2017-01-05 | Discharge: 2017-01-05 | Discharge status: Against Medical Advice | Payer: Self-pay | Attending: Emergency Medicine | Admitting: Emergency Medicine

## 2017-01-05 DIAGNOSIS — J45909 Unspecified asthma, uncomplicated: Secondary | ICD-10-CM | POA: Insufficient documentation

## 2017-01-05 DIAGNOSIS — F17210 Nicotine dependence, cigarettes, uncomplicated: Secondary | ICD-10-CM | POA: Insufficient documentation

## 2017-01-05 DIAGNOSIS — B353 Tinea pedis: Secondary | ICD-10-CM | POA: Insufficient documentation

## 2017-01-05 DIAGNOSIS — M79671 Pain in right foot: Secondary | ICD-10-CM | POA: Insufficient documentation

## 2017-01-05 DIAGNOSIS — M79672 Pain in left foot: Secondary | ICD-10-CM | POA: Insufficient documentation

## 2017-01-05 DIAGNOSIS — Z5329 Procedure and treatment not carried out because of patient's decision for other reasons: Secondary | ICD-10-CM | POA: Insufficient documentation

## 2017-01-05 MED ORDER — ACETAMINOPHEN 325 MG PO TABS
650.0000 mg | ORAL_TABLET | Freq: Once | ORAL | Status: AC
  Administered 2017-01-05: 650 mg via ORAL
  Filled 2017-01-05: qty 2

## 2017-01-05 MED ORDER — CLOTRIMAZOLE 1 % EX CREA: TOPICAL_CREAM | CUTANEOUS | 0 refills | Status: DC

## 2017-01-05 NOTE — ED Triage Notes (Signed)
Pt states that for the past several days ha has had blisters on his feet from athletes foot and they smell. Denies rash elsewhere

## 2017-01-05 NOTE — ED Notes (Signed)
See EDP assessment 

## 2017-01-05 NOTE — ED Triage Notes (Signed)
Pt returns today complaining of bilateral foot pain. He states that it is related to athlete's foot. He reports using powder, but wants they reevaluated. Ambulatory with a steady gait. A&Ox4.

## 2017-01-05 NOTE — ED Provider Notes (Signed)
[facility] EMERGENCY DEPARTMENT Provider Note   CSN: [id number] Arrival date & time: 01/05/17  1950     History   Chief Complaint Chief Complaint  Patient presents with  . Foot Pain    HPI Dustin Norris is a 41 y.o. male.  HPI   41 year old male presents today with complaints of foot pain.  Patient reports he has had a blisters and rash for several days.  He notes that this is very itchy.  He has had a history of the same and was diagnosed with athlete's foot.  Patient reports that he does not have any lotion or cream to place on this.  [facility] was [facility] at [facility], but refused to be evaluated in the hall.  Patient denies any other complaints today.  Patient reports that he walks frequently.      Past Medical History:  Diagnosis Date  . Anxiety   . Asthma   . Cocaine abuse (HCC)   . Homelessness   . Migraine   . Tobacco abuse     Patient Active Problem List   Diagnosis Date Noted  . 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 Cocaine use disorder, moderate, dependence (HCC) 07/26/2016  . 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 Psychosis (HCC) 07/24/2016  . Chest pain 11/23/2015  . Asthma 11/23/2015  . Tobacco abuse 11/23/2015  . Elevated lactic acid level 11/23/2015  . Cocaine abuse (HCC)   . Concern about STD in male without diagnosis   . Paranoid ideation (HCC) 02/22/2015    History reviewed. No pertinent surgical history.     Home Medications    Prior to Admission medications   Medication Sig Start Date End Date Taking? Authorizing Provider  acetaminophen (TYLENOL) 500 MG tablet Take 500 mg by mouth every 6 (six) hours as needed for moderate pain.     [provider]  albuterol (PROVENTIL HFA;VENTOLIN HFA) 108 (90 Base) MCG/ACT inhaler Inhale 2 puffs into the lungs every 4 (four) hours as needed for wheezing or shortness of breath. 07/31/16   Muthersbaugh, Dahlia ClientHannah, PA-C    clotrimazole (LOTRIMIN) 1 % cream Apply to affected area 2 times daily 01/05/17   Reginald Mangels, Tinnie GensJeffrey, PA-C  hydrOXYzine (ATARAX/VISTARIL) 25 MG tablet Take 1 tablet (25 mg total) by mouth 3 (three) times daily as needed for anxiety. 07/28/16   Denzil Magnusonhomas, Lashunda, NP  ibuprofen (ADVIL,MOTRIN) 600 MG tablet Take 1 tablet (600 mg total) by mouth every 6 (six) hours as needed. 12/24/16   Elpidio AnisUpstill, Shari, PA-C  ondansetron (ZOFRAN) 8 MG tablet Take 1 tablet (8 mg total) by mouth every 8 (eight) hours as needed for nausea or vomiting. 08/03/16   Street, [place], PA-C  Spacer/Aero-Holding Chambers (AEROCHAMBER PLUS WITH MASK) inhaler Use as instructed 07/31/16   Muthersbaugh, Dahlia ClientHannah, PA-C  traZODone (DESYREL) 50 MG tablet Take 1 tablet (50 mg total) by mouth at bedtime as needed for sleep. For insomnia 07/28/16   Denzil Magnusonhomas, Lashunda, NP    Family History No family history on file.  Social History Social History   Tobacco Use  . Smoking status: Current Every Day Smoker    Packs/day: 1.00    Years: 0.00    Pack years: 0.00    Types: Cigarettes, Cigars  . Smokeless tobacco: Never Used  Substance Use Topics  . Alcohol use: No  . Drug use: No    Comment: last use October 2017     Allergies   Haldol [haloperidol lactate]  Review of Systems Review of Systems  All other systems reviewed and are negative.    Physical Exam Updated Vital Signs BP (!) 162/107   Pulse (!) 113   Temp 98.6 F (37 C) (Oral)   Resp 20   Ht 5\' 7"  (1.702 m)   Wt 73 kg (161 lb)   SpO2 98%   BMI 25.22 kg/m   Physical Exam  Musculoskeletal:  Dry scaly irritated skin of the bilateral feet and enter digit spaces with several small blisters, no surrounding redness or edema  Nursing note and vitals reviewed.    ED Treatments / Results  Labs (all labs ordered are listed, but only abnormal results are displayed) Labs Reviewed - No data to display  EKG  EKG Interpretation None       Radiology No results  found.  Procedures Procedures (including critical care time)  Medications Ordered in ED Medications  acetaminophen (TYLENOL) tablet 650 mg (not administered)     Initial Impression / Assessment and Plan / ED Course  I have reviewed the triage vital signs and the nursing notes.  Pertinent labs & imaging results that were available during my care of the patient were reviewed by me and considered in my medical decision making (see chart for details).      Final Clinical Impressions(s) / ED Diagnoses   Final diagnoses:  Tinea pedis of both feet   Labs:   Imaging:  Consults:  Therapeutics:  Discharge Meds: Lotrimin  Assessment/Plan: Patient here with athlete's foot no signs of secondary infection.  Discharge with Lotrimin.       ED Discharge Orders        Ordered    clotrimazole (LOTRIMIN) 1 % cream     01/05/17 2040       Eyvonne MechanicHedges, Pedram Goodchild, PA-C 01/05/17 2043    Arby BarrettePfeiffer, Marcy, MD 01/06/17 [phone]

## 2017-01-05 NOTE — Discharge Instructions (Signed)
Please read attached information. If you experience any new or worsening signs or symptoms please return to the emergency room for evaluation. Please follow-up with your primary care provider or specialist as discussed. Please use medication prescribed only as directed and discontinue taking if you have any concerning signs or symptoms.   °

## 2017-01-05 NOTE — ED Notes (Signed)
Pt verbalizes understanding of d/c instructions. Pt received prescriptions. Pt ambulatory at d/c with all belongings.  

## 2017-01-05 NOTE — ED Provider Notes (Signed)
41 year old male with a h/o of tinea pedis presenting with bilateral foot pain.  He stretches his symptoms with powder, but his symptoms have not improved.   He is refusing physical examination of his feet and a hallway bed.  Discussed with the patient that no rooms are available at this time, and I can make the charge nurse aware, but he may have to return to the hallway. Charge nurse spoke with the patient, and the patient stated that he would leave go elsewhere for evaluation.   I have discussed my concerns as a provider and the possibility that this may worsen. We discussed the nature, risks and benefits, and alternatives to treatment. I have specifically discussed that without further evaluation I cannot guarantee there is not a life threatening event occuring.  Time was given to allow the opportunity to ask questions and consider the options, and after the discussion, the patient decided to refuse the offered treatment. Pt is A&Ox4, his own POA and states understanding of my concerns and the possible consequences. After refusal, I made every reasonable opportunity to treat them to the best of my ability. I have made the patient aware that this is an AMA discharge, but he may return at any time for further evaluation and treatment.       Frederik PearMcDonald, Mia A, PA-C 01/05/17 Billy [id number]    Belfi, Melanie, MD 01/05/17 2043

## 2017-01-06 ENCOUNTER — Emergency Department (HOSPITAL_COMMUNITY)
Admission: EM | Admit: 2017-01-06 | Discharge: 2017-01-06 | Disposition: A | Discharge status: Home / Self Care | Payer: Self-pay | Attending: Emergency Medicine | Admitting: Emergency Medicine

## 2017-01-06 ENCOUNTER — Other Ambulatory Visit: Payer: Self-pay

## 2017-01-06 ENCOUNTER — Encounter (HOSPITAL_COMMUNITY): Payer: Self-pay

## 2017-01-06 DIAGNOSIS — J4520 Mild intermittent asthma, uncomplicated: Secondary | ICD-10-CM

## 2017-01-06 DIAGNOSIS — G8929 Other chronic pain: Secondary | ICD-10-CM

## 2017-01-06 DIAGNOSIS — Z79899 Other long term (current) drug therapy: Secondary | ICD-10-CM | POA: Insufficient documentation

## 2017-01-06 DIAGNOSIS — Z590 Homelessness: Secondary | ICD-10-CM | POA: Insufficient documentation

## 2017-01-06 DIAGNOSIS — R51 Headache: Secondary | ICD-10-CM | POA: Insufficient documentation

## 2017-01-06 DIAGNOSIS — J4521 Mild intermittent asthma with (acute) exacerbation: Secondary | ICD-10-CM | POA: Insufficient documentation

## 2017-01-06 DIAGNOSIS — G43909 Migraine, unspecified, not intractable, without status migrainosus: Secondary | ICD-10-CM | POA: Insufficient documentation

## 2017-01-06 DIAGNOSIS — Z5321 Procedure and treatment not carried out due to patient leaving prior to being seen by health care provider: Secondary | ICD-10-CM | POA: Insufficient documentation

## 2017-01-06 DIAGNOSIS — F17210 Nicotine dependence, cigarettes, uncomplicated: Secondary | ICD-10-CM | POA: Insufficient documentation

## 2017-01-06 MED ORDER — ACETAMINOPHEN 500 MG PO TABS: 1000.0000 mg | ORAL_TABLET | Freq: Once | ORAL | Status: DC

## 2017-01-06 NOTE — ED Notes (Signed)
Patient out of room and verbally abusive to staff-calling primary RN a "bitch". Asked patient to go back in to room and patient declined. Patient declined any further treatment-given discharge papers and security escorted patient out of the ED.

## 2017-01-06 NOTE — ED Triage Notes (Signed)
Pt seen here earlier and discharge, now states that he has a migraine that has been going on all day.

## 2017-01-06 NOTE — ED Notes (Signed)
Bed: WA17 Expected date:  Expected time:  Means of arrival:  Comments: [facility] 41 [facility]/albuterol neb per EMS

## 2017-01-06 NOTE — ED Triage Notes (Signed)
Patient arrives by [facility]. [facility]. [facility]now stated to EMS his asthma was acting up. EMS states no wheezing, no SOB, patient requested an albuterol neb and EMS administered one. Patient is ambulatory and in no distress.

## 2017-01-06 NOTE — Discharge Instructions (Signed)
Tylenol 1000 mg every 6 hours as needed for pain.

## 2017-01-06 NOTE — ED Provider Notes (Signed)
[facility]-EMERGENCY DEPT Provider Note   CSN: [id number] Arrival date & time: 01/06/17  0147     History   Chief Complaint Chief Complaint  Patient presents with  . Asthma    HPI Dustin Norris is a 41 y.o. male.  Patient is a 41 year old male with history of anxiety, asthma, substance abuse, and homelessness.  He presents today for evaluation of wheezing.  [facility] after he had waited there a prolonged period of time to be seen, then called EMS and was brought here.  He was given a breathing treatment in the ambulance and his wheezing improved.  He is not complaining of headache.  This began in the absence of any injury or trauma and appears to be a chronic condition for him.  He is well-known to the emergency department as he comes here frequently.   The history is provided by the patient.  Asthma  This is a recurrent problem. The current episode started 3 to 5 hours ago. The problem occurs constantly. The problem has been rapidly improving. Associated symptoms include headaches and shortness of breath. Pertinent negatives include no chest pain. Nothing aggravates the symptoms. Relieved by: Nebulizer by EMS.    Past Medical History:  Diagnosis Date  . Anxiety   . Asthma   . Cocaine abuse (HCC)   . Homelessness   . Migraine   . Tobacco abuse     Patient Active Problem List   Diagnosis Date Noted  . 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 Cocaine use disorder, moderate, dependence (HCC) 07/26/2016  . 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 Psychosis (HCC) 07/24/2016  . Chest pain 11/23/2015  . Asthma 11/23/2015  . Tobacco abuse 11/23/2015  . Elevated lactic acid level 11/23/2015  . Cocaine abuse (HCC)   . Concern about STD in male without diagnosis   . Paranoid ideation (HCC) 02/22/2015    History reviewed. No pertinent surgical  history.     Home Medications    Prior to Admission medications   Medication Sig Start Date End Date Taking? Authorizing Provider  acetaminophen (TYLENOL) 500 MG tablet Take 500 mg by mouth every 6 (six) hours as needed for moderate pain.     [provider]  albuterol (PROVENTIL HFA;VENTOLIN HFA) 108 (90 Base) MCG/ACT inhaler Inhale 2 puffs into the lungs every 4 (four) hours as needed for wheezing or shortness of breath. 07/31/16   Muthersbaugh, Dahlia ClientHannah, PA-C  clotrimazole (LOTRIMIN) 1 % cream Apply to affected area 2 times daily 01/05/17   Hedges, Tinnie GensJeffrey, PA-C  hydrOXYzine (ATARAX/VISTARIL) 25 MG tablet Take 1 tablet (25 mg total) by mouth 3 (three) times daily as needed for anxiety. 07/28/16   Denzil Magnusonhomas, Lashunda, NP  ibuprofen (ADVIL,MOTRIN) 600 MG tablet Take 1 tablet (600 mg total) by mouth every 6 (six) hours as needed. 12/24/16   Elpidio AnisUpstill, Shari, PA-C  ondansetron (ZOFRAN) 8 MG tablet Take 1 tablet (8 mg total) by mouth every 8 (eight) hours as needed for nausea or vomiting. 08/03/16   Street, [place]cedes, PA-C  Spacer/Aero-Holding Chambers (AEROCHAMBER PLUS WITH MASK) inhaler Use as instructed 07/31/16   Muthersbaugh, Dahlia ClientHannah, PA-C  traZODone (DESYREL) 50 MG tablet Take 1 tablet (50 mg total) by mouth at bedtime as needed for sleep. For insomnia 07/28/16   Denzil Magnusonhomas, Lashunda, NP    Family History No family history on file.  Social History Social History   Tobacco Use  .  Smoking status: Current Every Day Smoker    Packs/day: 1.00    Years: 0.00    Pack years: 0.00    Types: Cigarettes, Cigars  . Smokeless tobacco: Never Used  Substance Use Topics  . Alcohol use: No  . Drug use: No    Comment: last use October 2017     Allergies   Haldol [haloperidol lactate]   Review of Systems Review of Systems  Respiratory: Positive for shortness of breath.   Cardiovascular: Negative for chest pain.  Neurological: Positive for headaches.  All other systems reviewed and are  negative.    Physical Exam Updated Vital Signs BP (!) 148/97 (BP Location: Right Arm)   Pulse (!) 103   Temp 98.7 F (37.1 C) (Oral)   Resp 18   SpO2 99%   Physical Exam  Constitutional: He is oriented to person, place, and time. He appears well-developed and well-nourished. No distress.  HENT:  Head: Normocephalic and atraumatic.  Mouth/Throat: Oropharynx is clear and moist.  Eyes: EOM are normal. Pupils are equal, round, and reactive to light.  Neck: Normal range of motion. Neck supple.  Cardiovascular: Normal rate and regular rhythm. Exam reveals no friction rub.  No murmur heard. Pulmonary/Chest: Effort normal and breath sounds normal. No respiratory distress. He has no wheezes. He has no rales.  Abdominal: Soft. Bowel sounds are normal. He exhibits no distension. There is no tenderness.  Musculoskeletal: Normal range of motion. He exhibits no edema.  Neurological: He is alert and oriented to person, place, and time. No cranial nerve deficit. He exhibits normal muscle tone. Coordination normal.  Skin: Skin is warm and dry. He is not diaphoretic.  Nursing note and vitals reviewed.    ED Treatments / Results  Labs (all labs ordered are listed, but only abnormal results are displayed) Labs Reviewed - No data to display  EKG  EKG Interpretation None       Radiology No results found.  Procedures Procedures (including critical care time)  Medications Ordered in ED Medications - No data to display   Initial Impression / Assessment and Plan / ED Course  I have reviewed the triage vital signs and the nursing notes.  Pertinent labs & imaging results that were available during my care of the patient were reviewed by me and considered in my medical decision making (see chart for details).  Patient presenting here with complaints of shortness of breath and headache, both of which are chronic problems.  I suspect the main reason the patient frequents the ER is related  to his being homeless.  I see no indication for any further workup.  He will be discharged, to follow-up as needed.  Final Clinical Impressions(s) / ED Diagnoses   Final diagnoses:  None    ED Discharge Orders    None       Geoffery Lyonselo, Yanett Conkright, MD 01/06/17 [phone]

## 2017-01-06 NOTE — ED Notes (Addendum)
Pt demanding to see doctor after he had just left the room. Told pt the doctor would be able to come back in a few minutes after seeing other patients. Pt decided to si on hallway bed to use phone provided by staff to call family. Staff asked pt to step back in his room to use the phone and that we could not allow him to hangout in the hallway. Pt refused and continued to talk on the phone loudly stating that staff was "harassing" him. Staff asked pt multiple times and pt refused to cooperate. Pt was escorted out by security.

## 2017-01-21 ENCOUNTER — Emergency Department (HOSPITAL_COMMUNITY)
Admission: EM | Admit: 2017-01-21 | Discharge: 2017-01-21 | Disposition: A | Discharge status: Home / Self Care | Payer: Self-pay | Attending: Emergency Medicine | Admitting: Emergency Medicine

## 2017-01-21 ENCOUNTER — Emergency Department (HOSPITAL_COMMUNITY): Payer: Self-pay

## 2017-01-21 ENCOUNTER — Other Ambulatory Visit: Payer: Self-pay

## 2017-01-21 ENCOUNTER — Encounter (HOSPITAL_COMMUNITY): Payer: Self-pay | Admitting: *Deleted

## 2017-01-21 DIAGNOSIS — J45909 Unspecified asthma, uncomplicated: Secondary | ICD-10-CM | POA: Insufficient documentation

## 2017-01-21 DIAGNOSIS — R51 Headache: Secondary | ICD-10-CM | POA: Insufficient documentation

## 2017-01-21 DIAGNOSIS — Z5321 Procedure and treatment not carried out due to patient leaving prior to being seen by health care provider: Secondary | ICD-10-CM | POA: Insufficient documentation

## 2017-01-21 DIAGNOSIS — F1420 Cocaine dependence, uncomplicated: Secondary | ICD-10-CM | POA: Insufficient documentation

## 2017-01-21 DIAGNOSIS — F419 Anxiety disorder, unspecified: Secondary | ICD-10-CM | POA: Insufficient documentation

## 2017-01-21 DIAGNOSIS — F17210 Nicotine dependence, cigarettes, uncomplicated: Secondary | ICD-10-CM | POA: Insufficient documentation

## 2017-01-21 DIAGNOSIS — Z590 Homelessness: Secondary | ICD-10-CM | POA: Insufficient documentation

## 2017-01-21 DIAGNOSIS — R000 Tachycardia, unspecified: Secondary | ICD-10-CM | POA: Insufficient documentation

## 2017-01-21 LAB — CBC
HEMATOCRIT: 42.1 % (ref 39.0–52.0)
HEMOGLOBIN: 13.8 g/dL (ref 13.0–17.0)
MCH: 29.2 pg (ref 26.0–34.0)
MCHC: 32.8 g/dL (ref 30.0–36.0)
MCV: 89.2 fL (ref 78.0–100.0)
Platelets: 250 10*3/uL (ref 150–400)
RBC: 4.72 MIL/uL (ref 4.22–5.81)
RDW: 14.2 % (ref 11.5–15.5)
WBC: 5.4 10*3/uL (ref 4.0–10.5)

## 2017-01-21 LAB — BASIC METABOLIC PANEL
ANION GAP: 10 (ref 5–15)
BUN: 8 mg/dL (ref 6–20)
CALCIUM: 8.7 mg/dL — AB (ref 8.9–10.3)
CO2: 23 mmol/L (ref 22–32)
Chloride: 105 mmol/L (ref 101–111)
Creatinine, Ser: 1.11 mg/dL (ref 0.61–1.24)
GLUCOSE: 142 mg/dL — AB (ref 65–99)
POTASSIUM: 3.5 mmol/L (ref 3.5–5.1)
SODIUM: 138 mmol/L (ref 135–145)

## 2017-01-21 LAB — I-STAT TROPONIN, ED: TROPONIN I, POC: 0 ng/mL (ref 0.00–0.08)

## 2017-01-21 MED ORDER — METOPROLOL TARTRATE 5 MG/5ML IV SOLN
5.0000 mg | Freq: Once | INTRAVENOUS | Status: AC
  Administered 2017-01-21: 5 mg via INTRAVENOUS
  Filled 2017-01-21: qty 5

## 2017-01-21 MED ORDER — LORAZEPAM 2 MG/ML IJ SOLN
1.0000 mg | Freq: Once | INTRAMUSCULAR | Status: DC
  Filled 2017-01-21: qty 1

## 2017-01-21 MED ORDER — SODIUM CHLORIDE 0.9 % IV BOLUS (SEPSIS)
1000.0000 mL | Freq: Once | INTRAVENOUS | Status: AC
  Administered 2017-01-21: 1000 mL via INTRAVENOUS

## 2017-01-21 NOTE — ED Triage Notes (Signed)
The pt is c/oa migraine headache and he also has asthma  He reports that he was just here earlier today for the same  And he was given a shot

## 2017-01-21 NOTE — ED Notes (Addendum)
Pt states he is allergic to ativan. EDP notified.

## 2017-01-21 NOTE — ED Triage Notes (Signed)
[facility] chest pain that began early this morning. Pt states "a little SOB" was associated with the cp and continues at this time. Pt states his head is also hurting; has hx of migraines. EMS EKG hr tachy 146bpm. EMS gave 324 ASA and 2 SL nitro PTA. Pt has no hx of a-fib. Pt is alert and oriented x4.

## 2017-01-21 NOTE — ED Provider Notes (Signed)
[facility] Provider Note   CSN: [id number] Arrival date & time: 01/21/17  1043     History   Chief Complaint Chief Complaint  Patient presents with  . Chest Pain    HPI Dustin Norris is a 41 y.o. male.  HPI Patient is a 41 year old male who presents to the emergency department with complaints of transient chest pain that is worse with movement and palpation of his anterior chest.  Denies fevers and chills.  Denies productive cough.  No prior history of cardiac disease.  Patient does have a known history of substance abuse including prior cocaine abuse.  He reports no recent use.  He admits that he is homeless and somewhat hopeless.  He has no suicidal or homicidal thoughts.  He states he is concerned about his overall well-being [place] in [place] and is thinking about relocating to [place].  He is currently homeless.  He denies chest discomfort at this time.  The chest discomfort he had earlier had no radiation.  He denies shortness of breath.   Past Medical History:  Diagnosis Date  . Anxiety   . Asthma   . Cocaine abuse (HCC)   . Homelessness   . Migraine   . Tobacco abuse     Patient Active Problem List   Diagnosis Date Noted  . 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 Cocaine use disorder, moderate, dependence (HCC) 07/26/2016  . 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 Psychosis (HCC) 07/24/2016  . Chest pain 11/23/2015  . Asthma 11/23/2015  . Tobacco abuse 11/23/2015  . Elevated lactic acid level 11/23/2015  . Cocaine abuse (HCC)   . Concern about STD in male without diagnosis   . Paranoid ideation (HCC) 02/22/2015    No past surgical history on file.     Home Medications    Prior to Admission medications   Medication Sig Start Date End Date Taking? Authorizing Provider  albuterol (PROVENTIL HFA;VENTOLIN HFA) 108 (90 Base) MCG/ACT inhaler Inhale 2  puffs into the lungs every 4 (four) hours as needed for wheezing or shortness of breath. 07/31/16  Yes Muthersbaugh, Dahlia ClientHannah, PA-C  hydrOXYzine (ATARAX/VISTARIL) 25 MG tablet Take 1 tablet (25 mg total) by mouth 3 (three) times daily as needed for anxiety. Patient not taking: Reported on 01/21/2017 07/28/16   Denzil Magnusonhomas, Lashunda, NP  ibuprofen (ADVIL,MOTRIN) 600 MG tablet Take 1 tablet (600 mg total) by mouth every 6 (six) hours as needed. Patient not taking: Reported on 01/21/2017 12/24/16   Elpidio AnisUpstill, Shari, PA-C  ondansetron (ZOFRAN) 8 MG tablet Take 1 tablet (8 mg total) by mouth every 8 (eight) hours as needed for nausea or vomiting. Patient not taking: Reported on 01/21/2017 08/03/16   Street, [place]cedes, PA-C  traZODone (DESYREL) 50 MG tablet Take 1 tablet (50 mg total) by mouth at bedtime as needed for sleep. For insomnia Patient not taking: Reported on 01/21/2017 07/28/16   Denzil Magnusonhomas, Lashunda, NP    Family History No family history on file.  Social History Social History   Tobacco Use  . Smoking status: Current Every Day Smoker    Packs/day: 1.00    Years: 0.00    Pack years: 0.00    Types: Cigarettes, Cigars  . Smokeless tobacco: Never Used  Substance Use Topics  . Alcohol use: No  . Drug use: No    Comment: last use October 2017     Allergies   Haldol [haloperidol lactate]   Review of  Systems Review of Systems  All other systems reviewed and are negative.    Physical Exam Updated Vital Signs BP (!) 140/91   Pulse 79   Resp 14   SpO2 99%   Physical Exam  Constitutional: He is oriented to person, place, and time. He appears well-developed and well-nourished.  HENT:  Head: Normocephalic and atraumatic.  Eyes: EOM are normal.  Neck: Normal range of motion.  Cardiovascular: Regular rhythm, normal heart sounds and intact distal pulses.  Tachycardia  Pulmonary/Chest: Effort normal and breath sounds normal. No respiratory distress.  Abdominal: Soft. He exhibits no  distension. There is no tenderness.  Musculoskeletal: Normal range of motion.  Neurological: He is alert and oriented to person, place, and time.  Skin: Skin is warm and dry.  Psychiatric: Judgment normal.  Anxious appearing  Nursing note and vitals reviewed.    ED Treatments / Results  Labs (all labs ordered are listed, but only abnormal results are displayed) Labs Reviewed  BASIC METABOLIC PANEL - Abnormal; Notable for the following components:      Result Value   Glucose, Bld 142 (*)    Calcium 8.7 (*)    All other components within normal limits  CBC  I-STAT TROPONIN, ED    EKG  EKG Interpretation  Date/Time:  Friday January 21 2017 10:59:39 EST Ventricular Rate:  102 PR Interval:    QRS Duration: 75 QT Interval:  335 QTC Calculation: 437 R Axis:   64 Text Interpretation:  Sinus tachycardia Probable left atrial enlargement Probable left ventricular hypertrophy No significant change was found Confirmed by Azalia Bilisampos, Varie Machamer ([id number]) on 01/21/2017 12:19:23 PM       Radiology Dg Chest 2 View  Result Date: 01/21/2017 CLINICAL DATA:  Chest pain and weakness started 2 days ago. Fever at home. EXAM: CHEST  2 VIEW COMPARISON:  07/12/2016. FINDINGS: The heart size and mediastinal contours are within normal limits. Both lungs are clear. The visualized skeletal structures are unremarkable. IMPRESSION: No active cardiopulmonary disease.  Stable chest. Electronically Signed   By: Elsie StainJohn T Curnes M.D.   On: 01/21/2017 12:01    Procedures Procedures (including critical care time)  Medications Ordered in ED Medications  sodium chloride 0.9 % bolus 1,000 mL (0 mLs Intravenous Stopped 01/21/17 1528)  metoprolol tartrate (LOPRESSOR) injection 5 mg (5 mg Intravenous Given 01/21/17 1317)     Initial Impression / Assessment and Plan / ED Course  I have reviewed the triage vital signs and the nursing notes.  Pertinent labs & imaging results that were available during my care of the  patient were reviewed by me and considered in my medical decision making (see chart for details).     Overall the patient is well-appearing.  He has a long-standing history of recurrent visits to the emergency department.  I will low suspicion for acute coronary syndrome.  Patient denies to be in either sinus rhythm or sinus tachycardia while in the emergency department.  During the time of my history and physical the patient's heart rate was consistently in the 140s but it appeared to be a sinus tachycardia.  He responded nicely to IV fluids as well as IV metoprolol.  He has been given outpatient resources for financial resources in the community as well as shelters in the community.  No suicidal or homicidal thoughts.  Patient is well-appearing.  Recommended ongoing abstinence from his substance abuse.  Patient was encouraged to return in the emergency department for any new or worsening or concerning symptoms.  Final Clinical Impressions(s) / ED Diagnoses   Final diagnoses:  Tachycardia  Anxiety    ED Discharge Orders    None       Azalia Bilis, MD 01/21/17 2309

## 2017-01-22 ENCOUNTER — Other Ambulatory Visit: Payer: Self-pay

## 2017-01-22 ENCOUNTER — Encounter (HOSPITAL_COMMUNITY): Payer: Self-pay | Admitting: Emergency Medicine

## 2017-01-22 ENCOUNTER — Emergency Department (HOSPITAL_COMMUNITY)
Admission: EM | Admit: 2017-01-22 | Discharge: 2017-01-22 | Disposition: A | Discharge status: Home / Self Care | Payer: Self-pay

## 2017-01-22 ENCOUNTER — Emergency Department (HOSPITAL_COMMUNITY)
Admission: EM | Admit: 2017-01-22 | Discharge: 2017-01-22 | Disposition: A | Discharge status: Home / Self Care | Payer: Self-pay | Attending: Emergency Medicine | Admitting: Emergency Medicine

## 2017-01-22 DIAGNOSIS — R030 Elevated blood-pressure reading, without diagnosis of hypertension: Secondary | ICD-10-CM | POA: Insufficient documentation

## 2017-01-22 DIAGNOSIS — R51 Headache: Secondary | ICD-10-CM | POA: Insufficient documentation

## 2017-01-22 DIAGNOSIS — J45909 Unspecified asthma, uncomplicated: Secondary | ICD-10-CM | POA: Insufficient documentation

## 2017-01-22 DIAGNOSIS — Z5900 Homelessness unspecified: Secondary | ICD-10-CM

## 2017-01-22 DIAGNOSIS — G4489 Other headache syndrome: Secondary | ICD-10-CM

## 2017-01-22 DIAGNOSIS — F17210 Nicotine dependence, cigarettes, uncomplicated: Secondary | ICD-10-CM | POA: Insufficient documentation

## 2017-01-22 DIAGNOSIS — Z5321 Procedure and treatment not carried out due to patient leaving prior to being seen by health care provider: Secondary | ICD-10-CM | POA: Insufficient documentation

## 2017-01-22 DIAGNOSIS — F1410 Cocaine abuse, uncomplicated: Secondary | ICD-10-CM | POA: Insufficient documentation

## 2017-01-22 DIAGNOSIS — R519 Headache, unspecified: Secondary | ICD-10-CM

## 2017-01-22 DIAGNOSIS — F419 Anxiety disorder, unspecified: Secondary | ICD-10-CM | POA: Insufficient documentation

## 2017-01-22 MED ORDER — ACETAMINOPHEN 325 MG PO TABS
650.0000 mg | ORAL_TABLET | Freq: Once | ORAL | Status: AC
  Administered 2017-01-22: 650 mg via ORAL
  Filled 2017-01-22: qty 2

## 2017-01-22 NOTE — ED Notes (Signed)
PT DISCHARGED. INSTRUCTIONS GIVEN. AAOX4. PT IN NO APPARENT DISTRESS OR PAIN. THE OPPORTUNITY TO ASK QUESTIONS WAS PROVIDED. 

## 2017-01-22 NOTE — ED Notes (Addendum)
Pt states, "I'm waiting for a couple hours before the suns up, and I can get out of here and go back to the other hospital. I haven't slept in 3 days."

## 2017-01-22 NOTE — ED Triage Notes (Signed)
Pt complains of headache, states he wanted to "stop by and get some tylenol:." Pt has no other complaints. Pain 7/10.

## 2017-01-22 NOTE — Discharge Instructions (Signed)
To find a primary care or specialty doctor please call [phone] or [phone] to access "[facility] Find a Doctor Service." ° °You may also go on the [facility] website at [email or web address] ° °There are also multiple [facility] practices throughout the [place] that are frequently accepting new patients. You may find a clinic that is close to your home and contact them. ° °[facility] -  °[address] °[place] [address] °[phone] ° ° °[facility] -  °[address] °[place] [address] °[phone] ° ° °[facility] - °[address]  °[place] [address] °[phone] ° ° °

## 2017-01-22 NOTE — ED Triage Notes (Signed)
Pt sitting in lobby, states he wants to cancel his check in.

## 2017-01-22 NOTE — ED Provider Notes (Signed)
[facility]-EMERGENCY DEPT Provider Note   CSN: [id number] Arrival date & time: 01/22/17  1816     History   Chief Complaint Chief Complaint  Patient presents with  . Headache    HPI Dustin Norris is a 41 y.o. male who is homeless and has had multiple ED visits in the [facility]. patient was here earlier today request place to sleep for a few hours. Tonight he returns to the ED with request for tylenol. Patient reports he is leaving town tonight to go back home to [place] and wanted to stop by for tylenol for a mild frontal headache before he leaves. The headache is similar to headaches in the past.  Patient states he will be moving to [place] to live with his family.  The history is provided by the patient. No language interpreter was used.  Headache   Pertinent negatives include no fever, no shortness of breath, no nausea and no vomiting.    Past Medical History:  Diagnosis Date  . Anxiety   . Asthma   . Cocaine abuse (HCC)   . Homelessness   . Migraine   . Tobacco abuse     Patient Active Problem List   Diagnosis Date Noted  . 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 Cocaine use disorder, moderate, dependence (HCC) 07/26/2016  . 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 Psychosis (HCC) 07/24/2016  . Chest pain 11/23/2015  . Asthma 11/23/2015  . Tobacco abuse 11/23/2015  . Elevated lactic acid level 11/23/2015  . Cocaine abuse (HCC)   . Concern about STD in male without diagnosis   . Paranoid ideation (HCC) 02/22/2015    No past surgical history on file.     Home Medications    Prior to Admission medications   Medication Sig Start Date End Date Taking? Authorizing Provider  albuterol (PROVENTIL HFA;VENTOLIN HFA) 108 (90 Base) MCG/ACT inhaler Inhale 2 puffs into the lungs every 4 (four) hours as needed for wheezing or shortness of breath. 07/31/16   Muthersbaugh, Dahlia ClientHannah, PA-C    hydrOXYzine (ATARAX/VISTARIL) 25 MG tablet Take 1 tablet (25 mg total) by mouth 3 (three) times daily as needed for anxiety. Patient not taking: Reported on 01/21/2017 07/28/16   Denzil Magnusonhomas, Lashunda, NP  ibuprofen (ADVIL,MOTRIN) 600 MG tablet Take 1 tablet (600 mg total) by mouth every 6 (six) hours as needed. Patient not taking: Reported on 01/21/2017 12/24/16   Elpidio AnisUpstill, Shari, PA-C  ondansetron (ZOFRAN) 8 MG tablet Take 1 tablet (8 mg total) by mouth every 8 (eight) hours as needed for nausea or vomiting. Patient not taking: Reported on 01/21/2017 08/03/16   Street, ClearwaterMercedes, PA-C  traZODone (DESYREL) 50 MG tablet Take 1 tablet (50 mg total) by mouth at bedtime as needed for sleep. For insomnia Patient not taking: Reported on 01/21/2017 07/28/16   Denzil Magnusonhomas, Lashunda, NP    Family History No family history on file.  Social History Social History   Tobacco Use  . Smoking status: Current Every Day Smoker    Packs/day: 1.00    Years: 0.00    Pack years: 0.00    Types: Cigarettes, Cigars  . Smokeless tobacco: Never Used  Substance Use Topics  . Alcohol use: No  . Drug use: No    Comment: last use October 2017     Allergies   Haldol [haloperidol lactate]   Review of Systems Review of Systems  Constitutional: Negative for chills and fever.  HENT: Negative.  Eyes: Negative for photophobia and visual disturbance.  Respiratory: Negative for shortness of breath.   Cardiovascular: Negative for chest pain.  Gastrointestinal: Negative for abdominal pain, nausea and vomiting.  Musculoskeletal: Negative for neck pain and neck stiffness.  Skin: Negative for wound.  Neurological: Positive for headaches. Negative for light-headedness.     Physical Exam Updated Vital Signs There were no vitals taken for this visit.  Physical Exam  Constitutional: He is oriented to person, place, and time. He appears well-developed and well-nourished. He does not appear ill. No distress.  HENT:  Head:  Normocephalic and atraumatic.  Nose: Nose normal.  Mouth/Throat: Uvula is midline, oropharynx is clear and moist and mucous membranes are normal.  Eyes: Conjunctivae and EOM are normal. Pupils are equal, round, and reactive to light.  Neck: Normal range of motion. Neck supple. No neck rigidity.  Cardiovascular: Normal rate and regular rhythm.  Pulmonary/Chest: Effort normal and breath sounds normal.  Musculoskeletal: Normal range of motion.  Neurological: He is alert and oriented to person, place, and time. He has normal strength. No cranial nerve deficit. He displays a negative Romberg sign. Gait normal.  Skin: Skin is warm and dry.  Psychiatric: He has a normal mood and affect.  Nursing note and vitals reviewed.    ED Treatments / Results  Labs (all labs ordered are listed, but only abnormal results are displayed) Labs Reviewed - No data to display  Radiology  Procedures Procedures (including critical care time)  Medications Ordered in ED Medications  acetaminophen (TYLENOL) tablet 650 mg (650 mg Oral Given 01/22/17 1846)     Initial Impression / Assessment and Plan / ED Course  I have reviewed the triage vital signs and the nursing notes. 41 y.o. male here for tylenol before he gets on a train at 8 pm to go home to [place] for a mild right frontal headache. Patient stable for d/c without neuro deficits, no concern for bleed or meningitis.   Final Clinical Impressions(s) / ED Diagnoses   Final diagnoses:  Sinus headache  Other headache syndrome    ED Discharge Orders    None       Kerrie Buffaloeese, Hope KysorvilleM, NP 01/22/17 1918    Charlynne PanderYao, David Hsienta, MD 01/23/17 1459

## 2017-01-22 NOTE — ED Triage Notes (Signed)
Patient is complaining that he dont feel good. [facility] and [facility]. Patient can not state was is making him feel bad.

## 2017-01-22 NOTE — ED Provider Notes (Signed)
TIME SEEN: 6:18 AM  CHIEF COMPLAINT: "I am here because I need a little bit of sleep"  HPI: Patient is a 41 year old male with history of cocaine abuse, asthma, homelessness who presents to the emergency department asking for somewhere to sleep.  He is currently homeless.  He initially begins the conversation stating that he has "minor complaints".  He states he has had "a little bit of headache", shortness of breath, abdominal pain.  None of these complaints currently.  Was just seen at [facility] last night for chest pain and had a negative troponin, clear chest x-ray.  Other blood work was unremarkable.  No vomiting or diarrhea.  No SI or HI.  ROS: See HPI Constitutional: no fever  Eyes: no drainage  ENT: no runny nose   Cardiovascular:  no chest pain  Resp: no SOB  GI: no vomiting GU: no dysuria Integumentary: no rash  Allergy: no hives  Musculoskeletal: no leg swelling  Neurological: no slurred speech ROS otherwise negative  PAST MEDICAL HISTORY/PAST SURGICAL HISTORY:  Past Medical History:  Diagnosis Date  . Anxiety   . Asthma   . Cocaine abuse (HCC)   . Homelessness   . Migraine   . Tobacco abuse     MEDICATIONS:  Prior to Admission medications   Medication Sig Start Date End Date Taking? Authorizing Provider  albuterol (PROVENTIL HFA;VENTOLIN HFA) 108 (90 Base) MCG/ACT inhaler Inhale 2 puffs into the lungs every 4 (four) hours as needed for wheezing or shortness of breath. 07/31/16   Muthersbaugh, Dahlia ClientHannah, PA-C  hydrOXYzine (ATARAX/VISTARIL) 25 MG tablet Take 1 tablet (25 mg total) by mouth 3 (three) times daily as needed for anxiety. Patient not taking: Reported on 01/21/2017 07/28/16   Denzil Magnusonhomas, Lashunda, NP  ibuprofen (ADVIL,MOTRIN) 600 MG tablet Take 1 tablet (600 mg total) by mouth every 6 (six) hours as needed. Patient not taking: Reported on 01/21/2017 12/24/16   Elpidio AnisUpstill, Shari, PA-C  ondansetron (ZOFRAN) 8 MG tablet Take 1 tablet (8 mg total) by mouth every 8 (eight)  hours as needed for nausea or vomiting. Patient not taking: Reported on 01/21/2017 08/03/16   Street, AtkinsMercedes, PA-C  traZODone (DESYREL) 50 MG tablet Take 1 tablet (50 mg total) by mouth at bedtime as needed for sleep. For insomnia Patient not taking: Reported on 01/21/2017 07/28/16   Denzil Magnusonhomas, Lashunda, NP    ALLERGIES:  Allergies  Allergen Reactions  . Haldol [Haloperidol Lactate] Anaphylaxis    SOCIAL HISTORY:  Social History   Tobacco Use  . Smoking status: Current Every Day Smoker    Packs/day: 1.00    Years: 0.00    Pack years: 0.00    Types: Cigarettes, Cigars  . Smokeless tobacco: Never Used  Substance Use Topics  . Alcohol use: No    FAMILY HISTORY: History reviewed. No pertinent family history.  EXAM: BP (!) 133/104 (BP Location: Left Arm)   Pulse 85   Temp 98.5 F (36.9 C) (Oral)   Resp 15   Ht 5\' 7"  (1.702 m)   Wt 73 kg (161 lb)   SpO2 100%   BMI 25.22 kg/m  CONSTITUTIONAL: Alert and oriented and responds appropriately to questions. Well-appearing; well-nourished HEAD: Normocephalic EYES: Conjunctivae clear, pupils appear equal, EOMI ENT: normal nose; moist mucous membranes NECK: Supple, no meningismus, no nuchal rigidity, no LAD  CARD: RRR; S1 and S2 appreciated; no murmurs, no clicks, no rubs, no gallops RESP: Normal chest excursion without splinting or tachypnea; breath sounds clear and equal bilaterally; no  wheezes, no rhonchi, no rales, no hypoxia or respiratory distress, speaking full sentences ABD/GI: Normal bowel sounds; non-distended; soft, non-tender, no rebound, no guarding, no peritoneal signs, no hepatosplenomegaly BACK:  The back appears normal and is non-tender to palpation, there is no CVA tenderness EXT: Normal ROM in all joints; non-tender to palpation; no edema; normal capillary refill; no cyanosis, no calf tenderness or swelling    SKIN: Normal color for age and race; warm; no rash NEURO: Moves all extremities equally, normal speech,  normal gait PSYCH: The patient's mood and manner are appropriate. Grooming and personal hygiene are appropriate.  MEDICAL DECISION MAKING: Patient here requesting somewhere to sleep.  I suspect that he is here because he is homeless.  He has been here 22 times in the past 6 months.  Had a [facility]up at [facility] which was unremarkable.  Was tachycardic there but this is improved and his heart rate is now 85.  He has no current complaints.  Lungs are clear.  Recent chest x-ray clear.  Abdominal exam benign.  No vomiting or diarrhea.  Discussed with him that I feel he is okay to wait in our waiting room until the buses start running.  I do not feel he needs further emergent workup.  Given outpatient resources.  Given sprite to drink in the ER.  At this time, I do not feel there is any life-threatening condition present. I have reviewed and discussed all results (EKG, imaging, lab, urine as appropriate) and exam findings with patient/family. I have reviewed nursing notes and appropriate previous records.  I feel the patient is safe to be discharged home without further emergent workup and can continue workup as an outpatient as needed. Discussed usual and customary return precautions. Patient/family verbalize understanding and are comfortable with this plan.  Outpatient follow-up has been provided if needed. All questions have been answered.       Xaiver Roskelley, Layla MawKristen N, [place]DO 01/22/17 [phone]

## 2017-01-22 NOTE — ED Notes (Signed)
Pt seen walking out of the lobby by ED staff.

## 2017-01-23 ENCOUNTER — Encounter (HOSPITAL_COMMUNITY): Payer: Self-pay

## 2017-01-23 ENCOUNTER — Other Ambulatory Visit: Payer: Self-pay

## 2017-01-23 ENCOUNTER — Emergency Department (HOSPITAL_COMMUNITY)
Admission: EM | Admit: 2017-01-23 | Discharge: 2017-01-23 | Disposition: A | Discharge status: Home / Self Care | Payer: Self-pay | Attending: Emergency Medicine | Admitting: Emergency Medicine

## 2017-01-23 ENCOUNTER — Encounter (HOSPITAL_COMMUNITY): Payer: Self-pay | Admitting: Emergency Medicine

## 2017-01-23 DIAGNOSIS — F1410 Cocaine abuse, uncomplicated: Secondary | ICD-10-CM | POA: Insufficient documentation

## 2017-01-23 DIAGNOSIS — F17210 Nicotine dependence, cigarettes, uncomplicated: Secondary | ICD-10-CM | POA: Insufficient documentation

## 2017-01-23 DIAGNOSIS — Z5321 Procedure and treatment not carried out due to patient leaving prior to being seen by health care provider: Secondary | ICD-10-CM | POA: Insufficient documentation

## 2017-01-23 DIAGNOSIS — G43909 Migraine, unspecified, not intractable, without status migrainosus: Secondary | ICD-10-CM | POA: Insufficient documentation

## 2017-01-23 DIAGNOSIS — J45909 Unspecified asthma, uncomplicated: Secondary | ICD-10-CM | POA: Insufficient documentation

## 2017-01-23 DIAGNOSIS — Z590 Homelessness: Secondary | ICD-10-CM | POA: Insufficient documentation

## 2017-01-23 DIAGNOSIS — G43809 Other migraine, not intractable, without status migrainosus: Secondary | ICD-10-CM

## 2017-01-23 MED ORDER — METOCLOPRAMIDE HCL 10 MG PO TABS
10.0000 mg | ORAL_TABLET | Freq: Once | ORAL | Status: AC
  Administered 2017-01-23: 10 mg via ORAL
  Filled 2017-01-23: qty 1

## 2017-01-23 MED ORDER — IBUPROFEN 800 MG PO TABS
800.0000 mg | ORAL_TABLET | Freq: Once | ORAL | Status: AC
  Administered 2017-01-23: 800 mg via ORAL
  Filled 2017-01-23: qty 1

## 2017-01-23 NOTE — ED Triage Notes (Signed)
Pt not answering from triage 

## 2017-01-23 NOTE — ED Notes (Signed)
Pt has not returned back to his room. Pt left AMA.

## 2017-01-23 NOTE — ED Notes (Addendum)
Pt left room and stated that he was going to smoke.

## 2017-01-23 NOTE — ED Triage Notes (Signed)
Patient c/o headache. Requesting tylenol.

## 2017-01-23 NOTE — ED Provider Notes (Signed)
[facility]-EMERGENCY DEPT Provider Note   CSN: [id number] Arrival date & time: 01/23/17  [id number]     History   Chief Complaint Chief Complaint  Patient presents with  . Eye Pain    HPI Dustin Norris is a 41 y.o. male.  HPI 41 year old male who presents with headache.  He states history of migraine headaches, and this feels the same.  Symptoms have been ongoing for 2-3 days.  Headache described as behind both of his eyes associated with photophobia.  No nausea, vomiting, fever, recent illnesses, vision or speech changes, focal numbness or weakness.  Has not taking any medication for symptoms.   Past Medical History:  Diagnosis Date  . Anxiety   . Asthma   . Cocaine abuse (HCC)   . Homelessness   . Migraine   . Tobacco abuse     Patient Active Problem List   Diagnosis Date Noted  . 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 Cocaine use disorder, moderate, dependence (HCC) 07/26/2016  . 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 Psychosis (HCC) 07/24/2016  . Chest pain 11/23/2015  . Asthma 11/23/2015  . Tobacco abuse 11/23/2015  . Elevated lactic acid level 11/23/2015  . Cocaine abuse (HCC)   . Concern about STD in male without diagnosis   . Paranoid ideation (HCC) 02/22/2015    History reviewed. No pertinent surgical history.     Home Medications    Prior to Admission medications   Medication Sig Start Date End Date Taking? Authorizing Provider  albuterol (PROVENTIL HFA;VENTOLIN HFA) 108 (90 Base) MCG/ACT inhaler Inhale 2 puffs into the lungs every 4 (four) hours as needed for wheezing or shortness of breath. 07/31/16   Muthersbaugh, Dahlia ClientHannah, PA-C  hydrOXYzine (ATARAX/VISTARIL) 25 MG tablet Take 1 tablet (25 mg total) by mouth 3 (three) times daily as needed for anxiety. Patient not taking: Reported on 01/21/2017 07/28/16   Denzil Magnusonhomas, Lashunda, NP  ibuprofen (ADVIL,MOTRIN) 600 MG tablet  Take 1 tablet (600 mg total) by mouth every 6 (six) hours as needed. Patient not taking: Reported on 01/21/2017 12/24/16   Elpidio AnisUpstill, Shari, PA-C  ondansetron (ZOFRAN) 8 MG tablet Take 1 tablet (8 mg total) by mouth every 8 (eight) hours as needed for nausea or vomiting. Patient not taking: Reported on 01/21/2017 08/03/16   Street, SeymourMercedes, PA-C  traZODone (DESYREL) 50 MG tablet Take 1 tablet (50 mg total) by mouth at bedtime as needed for sleep. For insomnia Patient not taking: Reported on 01/21/2017 07/28/16   Denzil Magnusonhomas, Lashunda, NP    Family History History reviewed. No pertinent family history.  Social History Social History   Tobacco Use  . Smoking status: Current Every Day Smoker    Packs/day: 1.00    Years: 0.00    Pack years: 0.00    Types: Cigarettes, Cigars  . Smokeless tobacco: Never Used  Substance Use Topics  . Alcohol use: No  . Drug use: No    Comment: last use October 2017     Allergies   Haldol [haloperidol lactate]   Review of Systems Review of Systems  Constitutional: Negative for fever.  HENT: Negative for congestion and sore throat.   Respiratory: Negative for shortness of breath.   Cardiovascular: Negative for chest pain.  Gastrointestinal: Negative for abdominal distention, nausea and vomiting.  Musculoskeletal: Negative for neck pain.  Skin: Negative for wound.  Allergic/Immunologic: Negative for immunocompromised state.  Neurological: Positive for headaches.  Hematological: Does not bruise/bleed easily.  Physical Exam Updated Vital Signs BP 133/88 (BP Location: Left Arm)   Pulse (!) 105   Temp 98.2 F (36.8 C) (Oral)   Ht 5\' 7"  (1.702 m)   Wt 73 kg (161 lb)   SpO2 98%   BMI 25.22 kg/m   Physical Exam Physical Exam  Nursing note and vitals reviewed. Constitutional: Well developed, well nourished, non-toxic, and in no acute distress Head: Normocephalic and atraumatic.  Mouth/Throat: Oropharynx is clear and moist.   Neck: Normal  range of motion. Neck supple. No nuchal rigidity Cardiovascular: Normal rate and regular rhythm.   Pulmonary/Chest: Effort normal and breath sounds normal.  Abdominal: Soft. There is no tenderness. There is no rebound and no guarding.  Musculoskeletal: Normal range of motion.  Skin: Skin is warm and dry.  Psychiatric: Cooperative Neurological:  Alert, oriented to person, place, time, and situation. Memory grossly in tact. Fluent speech. No dysarthria or aphasia.  Cranial nerves: VF are full.  Pupils are symmetric, and reactive to light. EOMI without nystagmus. No gaze deviation. Facial muscles symmetric with activation. Sensation to light touch over face in tact bilaterally. Hearing grossly in tact. Palate elevates symmetrically. Head turn and shoulder shrug are intact. Tongue midline.  Reflexes defered.  Muscle bulk and tone normal. No pronator drift. Moves all extremities symmetrically. Sensation to light touch is in tact throughout in bilateral upper and lower extremities. Coordination reveals no dysmetria with finger to nose. Gait is narrow-based and steady. Non-ataxic.   ED Treatments / Results  Labs (all labs ordered are listed, but only abnormal results are displayed) Labs Reviewed - No data to display  EKG  EKG Interpretation None       Radiology Dg Chest 2 View  Result Date: 01/21/2017 CLINICAL DATA:  Chest pain and weakness started 2 days ago. Fever at home. EXAM: CHEST  2 VIEW COMPARISON:  07/12/2016. FINDINGS: The heart size and mediastinal contours are within normal limits. Both lungs are clear. The visualized skeletal structures are unremarkable. IMPRESSION: No active cardiopulmonary disease.  Stable chest. Electronically Signed   By: Elsie StainJohn T Curnes M.D.   On: 01/21/2017 12:01    Procedures Procedures (including critical care time)  Medications Ordered in ED Medications  ibuprofen (ADVIL,MOTRIN) tablet 800 mg (not administered)  metoCLOPramide (REGLAN) tablet 10  mg (not administered)     Initial Impression / Assessment and Plan / ED Course  I have reviewed the triage vital signs and the nursing notes.  Pertinent labs & imaging results that were available during my care of the patient were reviewed by me and considered in my medical decision making (see chart for details).     Records reviewed. Patient presents to ED quite frequently, with his 10th visit so far this month.  History of homelessness and substance abuse.  He is well-appearing in no acute distress with stable vital signs.  Intact neurological exam.  States that this is normal for his migraine headaches but then states that he would just like to rest here in the ED. Do not suspect serious intracranial processes. Given ibuprofen and reglan. will discharge with continued supportive care.  Final Clinical Impressions(s) / ED Diagnoses   Final diagnoses:  Other migraine without status migrainosus, not intractable    ED Discharge Orders    None       Lavera GuiseLiu, [place] Flett Duo, MD 01/23/17 [phone]

## 2017-01-23 NOTE — ED Triage Notes (Signed)
Pt c/o eye irritation and pressure in ears, denies congestion.

## 2017-01-23 NOTE — ED Triage Notes (Signed)
Pt was going to room 11 and stated that he didn't want a room right now and was going to wait in the lobby

## 2017-01-23 NOTE — Discharge Instructions (Addendum)
Continue taking ibuprofen and tylenol for pain control.   Return for worsening symptoms.

## 2017-01-23 NOTE — ED Triage Notes (Signed)
Pt complains of a headache and high blood pressure, he states he's never taken blood pressure meds

## 2017-01-24 ENCOUNTER — Encounter (HOSPITAL_COMMUNITY): Payer: Self-pay | Admitting: Emergency Medicine

## 2017-01-24 ENCOUNTER — Emergency Department (HOSPITAL_COMMUNITY)
Admission: EM | Admit: 2017-01-24 | Discharge: 2017-01-24 | Disposition: A | Discharge status: Home / Self Care | Payer: Self-pay | Attending: Emergency Medicine | Admitting: Emergency Medicine

## 2017-01-24 DIAGNOSIS — Z79899 Other long term (current) drug therapy: Secondary | ICD-10-CM | POA: Insufficient documentation

## 2017-01-24 DIAGNOSIS — R519 Headache, unspecified: Secondary | ICD-10-CM

## 2017-01-24 DIAGNOSIS — R51 Headache: Secondary | ICD-10-CM | POA: Insufficient documentation

## 2017-01-24 DIAGNOSIS — F17210 Nicotine dependence, cigarettes, uncomplicated: Secondary | ICD-10-CM | POA: Insufficient documentation

## 2017-01-24 DIAGNOSIS — J45909 Unspecified asthma, uncomplicated: Secondary | ICD-10-CM | POA: Insufficient documentation

## 2017-01-24 MED ORDER — ACETAMINOPHEN 325 MG PO TABS
650.0000 mg | ORAL_TABLET | Freq: Once | ORAL | Status: AC
  Administered 2017-01-24: 650 mg via ORAL
  Filled 2017-01-24: qty 2

## 2017-01-24 NOTE — ED Triage Notes (Signed)
Patient here from lobby headache.  Hx of sam.  Symptoms have been ongoing for 2-3 days. Patient recently discharge for same, has been sitting out in lobby all day. Homeless.

## 2017-01-24 NOTE — ED Notes (Signed)
Pt denies vision changes, dizziness etc. Pt did state that he has a light sensitivity. Pt is currently playing on his cell phone.

## 2017-01-24 NOTE — ED Provider Notes (Signed)
[facility]-EMERGENCY DEPT Provider Note   CSN: [id number] Arrival date & time: 01/24/17  0114     History   Chief Complaint Chief Complaint  Patient presents with  . Headache    HPI Dustin Norris is a 41 y.o. male.  HPI Dustin Norris is a 41 y.o. male history of homelessness, migraine, cocaine abuse, asthma, presents to emergency department complaining of a headache.  Patient states he has chronic headaches.  States this headache is similar to prior.  He reports pain all over his head.  Reports photosensitivity.  Denies any nausea or vomiting.  No dizziness.  No chest pain or abdominal pain.  He has not tried any medications for this at home, states that he is homeless and does not have any money to obtain medications.  He is requesting Tylenol for his headache and something to eat and drink.  Past Medical History:  Diagnosis Date  . Anxiety   . Asthma   . Cocaine abuse (HCC)   . Homelessness   . Migraine   . Tobacco abuse     Patient Active Problem List   Diagnosis Date Noted  . 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 Cocaine use disorder, moderate, dependence (HCC) 07/26/2016  . 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 Psychosis (HCC) 07/24/2016  . Chest pain 11/23/2015  . Asthma 11/23/2015  . Tobacco abuse 11/23/2015  . Elevated lactic acid level 11/23/2015  . Cocaine abuse (HCC)   . Concern about STD in male without diagnosis   . Paranoid ideation (HCC) 02/22/2015    History reviewed. No pertinent surgical history.     Home Medications    Prior to Admission medications   Medication Sig Start Date End Date Taking? Authorizing Provider  acetaminophen (TYLENOL) 500 MG tablet Take 500 mg by mouth every 6 (six) hours as needed for mild pain or headache.   Yes [provider]  albuterol (PROVENTIL HFA;VENTOLIN HFA) 108 (90 Base) MCG/ACT inhaler Inhale 2 puffs into  the lungs every 4 (four) hours as needed for wheezing or shortness of breath. 07/31/16  Yes Muthersbaugh, Dahlia ClientHannah, PA-C  hydrOXYzine (ATARAX/VISTARIL) 25 MG tablet Take 1 tablet (25 mg total) by mouth 3 (three) times daily as needed for anxiety. Patient not taking: Reported on 01/21/2017 07/28/16   Denzil Magnusonhomas, Lashunda, NP  ibuprofen (ADVIL,MOTRIN) 600 MG tablet Take 1 tablet (600 mg total) by mouth every 6 (six) hours as needed. Patient not taking: Reported on 01/21/2017 12/24/16   Elpidio AnisUpstill, Shari, PA-C  ondansetron (ZOFRAN) 8 MG tablet Take 1 tablet (8 mg total) by mouth every 8 (eight) hours as needed for nausea or vomiting. Patient not taking: Reported on 01/21/2017 08/03/16   Street, LincolniaMercedes, PA-C  traZODone (DESYREL) 50 MG tablet Take 1 tablet (50 mg total) by mouth at bedtime as needed for sleep. For insomnia Patient not taking: Reported on 01/21/2017 07/28/16   Denzil Magnusonhomas, Lashunda, NP    Family History No family history on file.  Social History Social History   Tobacco Use  . Smoking status: Current Every Day Smoker    Packs/day: 1.00    Years: 0.00    Pack years: 0.00    Types: Cigarettes, Cigars  . Smokeless tobacco: Never Used  Substance Use Topics  . Alcohol use: No  . Drug use: No    Comment: last use October 2017     Allergies   Haldol [haloperidol lactate]   Review of Systems Review of  Systems  Constitutional: Negative for chills and fever.  Eyes: Positive for photophobia. Negative for pain and visual disturbance.  Respiratory: Negative for cough, chest tightness and shortness of breath.   Cardiovascular: Negative for chest pain, palpitations and leg swelling.  Gastrointestinal: Negative for abdominal distention, abdominal pain, diarrhea, nausea and vomiting.  Genitourinary: Negative for dysuria, frequency, hematuria and urgency.  Musculoskeletal: Negative for arthralgias, myalgias, neck pain and neck stiffness.  Skin: Negative for rash.  Allergic/Immunologic: Negative  for immunocompromised state.  Neurological: Positive for headaches. Negative for dizziness, weakness, light-headedness and numbness.  All other systems reviewed and are negative.    Physical Exam Updated Vital Signs BP 133/79 (BP Location: Right Arm)   Pulse 79   Temp 98 F (36.7 C) (Oral)   Resp 18   SpO2 100%   Physical Exam  Constitutional: He is oriented to person, place, and time. He appears well-developed and well-nourished. No distress.  HENT:  Head: Normocephalic and atraumatic.  Eyes: Conjunctivae and EOM are normal. Pupils are equal, round, and reactive to light.  Neck: Neck supple.  Cardiovascular: Normal rate, regular rhythm and normal heart sounds.  Pulmonary/Chest: Effort normal. No respiratory distress. He has no wheezes. He has no rales.  Abdominal: Soft. Bowel sounds are normal. He exhibits no distension. There is no tenderness. There is no rebound.  Musculoskeletal: He exhibits no edema.  Neurological: He is alert and oriented to person, place, and time.  Gait is normal. 5/5 and equal upper and lower extremity strength bilaterally. Equal grip strength bilaterally. Normal finger to nose and heel to shin. No pronator drift.   Skin: Skin is warm and dry.  Nursing note and vitals reviewed.    ED Treatments / Results  Labs (all labs ordered are listed, but only abnormal results are displayed) Labs Reviewed - No data to display  EKG  EKG Interpretation None       Radiology No results found.  Procedures Procedures (including critical care time)  Medications Ordered in ED Medications  acetaminophen (TYLENOL) tablet 650 mg (650 mg Oral Given 01/24/17 0417)     Initial Impression / Assessment and Plan / ED Course  I have reviewed the triage vital signs and the nursing notes.  Pertinent labs & imaging results that were available during my care of the patient were reviewed by me and considered in my medical decision making (see chart for details).       Patient in the emergency department with headaches.  He is neurovascularly intact.  Vital signs are normal.  Neurological exam normal.  Will give Tylenol.  Will give drinks and a sandwich.  Will reassess.  Feels better. Will dc home.   Vitals:   01/24/17 0211 01/24/17 0433  BP: 133/79 123/74  Pulse: 79 73  Resp: 18   Temp: 98 F (36.7 C)   TempSrc: Oral   SpO2: 100% 98%     Final Clinical Impressions(s) / ED Diagnoses   Final diagnoses:  Nonintractable headache, unspecified chronicity pattern, unspecified headache type    ED Discharge Orders    None       Jaynie CrumbleKirichenko, Eun Vermeer, PA-C 01/24/17 [id number]    Derwood KaplanNanavati, Ankit, MD 01/25/17 [phone]

## 2017-01-24 NOTE — Discharge Instructions (Signed)
Take tylenol or motrin for your headache as needed. Follow up with a primary care doctor.

## 2017-01-25 ENCOUNTER — Emergency Department (HOSPITAL_COMMUNITY)
Admission: EM | Admit: 2017-01-25 | Discharge: 2017-01-25 | Disposition: A | Discharge status: Home / Self Care | Payer: Self-pay | Attending: Emergency Medicine | Admitting: Emergency Medicine

## 2017-01-25 ENCOUNTER — Emergency Department (HOSPITAL_COMMUNITY): Payer: Self-pay

## 2017-01-25 ENCOUNTER — Encounter (HOSPITAL_COMMUNITY): Payer: Self-pay | Admitting: Emergency Medicine

## 2017-01-25 DIAGNOSIS — R0602 Shortness of breath: Secondary | ICD-10-CM | POA: Insufficient documentation

## 2017-01-25 DIAGNOSIS — J4521 Mild intermittent asthma with (acute) exacerbation: Secondary | ICD-10-CM | POA: Insufficient documentation

## 2017-01-25 DIAGNOSIS — F17210 Nicotine dependence, cigarettes, uncomplicated: Secondary | ICD-10-CM | POA: Insufficient documentation

## 2017-01-25 DIAGNOSIS — Z720 Tobacco use: Secondary | ICD-10-CM

## 2017-01-25 DIAGNOSIS — Z79899 Other long term (current) drug therapy: Secondary | ICD-10-CM | POA: Insufficient documentation

## 2017-01-25 DIAGNOSIS — R0789 Other chest pain: Secondary | ICD-10-CM

## 2017-01-25 DIAGNOSIS — Z5900 Homelessness unspecified: Secondary | ICD-10-CM

## 2017-01-25 MED ORDER — ALBUTEROL SULFATE (2.5 MG/3ML) 0.083% IN NEBU
5.0000 mg | INHALATION_SOLUTION | Freq: Once | RESPIRATORY_TRACT | Status: AC
  Administered 2017-01-25: 5 mg via RESPIRATORY_TRACT
  Filled 2017-01-25: qty 6

## 2017-01-25 MED ORDER — IPRATROPIUM BROMIDE 0.02 % IN SOLN
0.5000 mg | Freq: Once | RESPIRATORY_TRACT | Status: AC
  Administered 2017-01-25: 0.5 mg via RESPIRATORY_TRACT
  Filled 2017-01-25: qty 2.5

## 2017-01-25 NOTE — ED Notes (Signed)
Went to discharge patient, he left before receiving papers. Last seen in no distress.

## 2017-01-25 NOTE — ED Provider Notes (Signed)
[facility]-EMERGENCY DEPT Provider Note   CSN: [id number] Arrival date & time: 01/25/17  1806     History   Chief Complaint Chief Complaint  Patient presents with  . Asthma    HPI Dustin Norris is a 41 y.o. male with a PMHx of homelessness, anxiety, asthma, cocaine abuse, tobacco abuse, and other conditions listed below, who is well known to the ED with 27 visits in the last 6 months, including ED visit yesterday for headache, who presents to the ED today via EMS with complaints of central chest tightness, shortness of breath, and wheezing onset approximately 1 hour ago.  Patient states that he had walked downtown and smoked a black-n-mild, and developed 10/10 constant central chest tightness type pain which was nonradiating, worse with movement, unchanged with exertion or inspiration, and moderately improved with his home albuterol inhaler.  He states this feels like prior asthma attacks.  He reports that he felt short of breath and was wheezing, but the albuterol inhaler has helped that as well.  He was seen for similar symptoms on 01/06/17, did not have any labs/imaging/work up done, exam was benign, so he was discharged.  He has had frequent visits to the ED for multiple chronic complaints, and seems as though it's usually because of his homelessness.  He admits to smoking cigarettes.  He denies any drug use or cocaine use today.  He also denies diaphoresis, lightheadedness, fevers, chills, cough, URI symptoms, LE swelling, recent travel/surgery/immobilization, personal/family hx of DVT/PE, abd pain, N/V/D/C, hematuria, dysuria, myalgias, arthralgias, claudication, orthopnea, numbness, tingling, focal weakness, or any other complaints at this time. No known FHx of cardiac disease.     The history is provided by the patient and medical records. No language interpreter was used.  Asthma  This is a recurrent problem. The current episode started less than 1 hour ago.  The problem occurs constantly. The problem has been rapidly improving. Associated symptoms include chest pain (tightness) and shortness of breath. Pertinent negatives include no abdominal pain. Exacerbated by: movement. Relieved by: albuterol inhaler. Treatments tried: albuterol inhaler. The treatment provided moderate relief.    Past Medical History:  Diagnosis Date  . Anxiety   . Asthma   . Cocaine abuse (HCC)   . Homelessness   . Migraine   . Tobacco abuse     Patient Active Problem List   Diagnosis Date Noted  . 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 Cocaine use disorder, moderate, dependence (HCC) 07/26/2016  . 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 Psychosis (HCC) 07/24/2016  . Chest pain 11/23/2015  . Asthma 11/23/2015  . Tobacco abuse 11/23/2015  . Elevated lactic acid level 11/23/2015  . Cocaine abuse (HCC)   . Concern about STD in male without diagnosis   . Paranoid ideation (HCC) 02/22/2015    History reviewed. No pertinent surgical history.     Home Medications    Prior to Admission medications   Medication Sig Start Date End Date Taking? Authorizing Provider  acetaminophen (TYLENOL) 500 MG tablet Take 500 mg by mouth every 6 (six) hours as needed for mild pain or headache.    [provider]  albuterol (PROVENTIL HFA;VENTOLIN HFA) 108 (90 Base) MCG/ACT inhaler Inhale 2 puffs into the lungs every 4 (four) hours as needed for wheezing or shortness of breath. 07/31/16   Muthersbaugh, Dahlia Client, PA-C  hydrOXYzine (ATARAX/VISTARIL) 25 MG tablet Take 1 tablet (25 mg total) by mouth 3 (three) times  daily as needed for anxiety. Patient not taking: Reported on 01/21/2017 07/28/16   Denzil Magnuson, NP  ibuprofen (ADVIL,MOTRIN) 600 MG tablet Take 1 tablet (600 mg total) by mouth every 6 (six) hours as needed. Patient not taking: Reported on 01/21/2017 12/24/16   Elpidio Anis, PA-C  ondansetron (ZOFRAN)  8 MG tablet Take 1 tablet (8 mg total) by mouth every 8 (eight) hours as needed for nausea or vomiting. Patient not taking: Reported on 01/21/2017 08/03/16   Evanna Washinton, [place], PA-C  traZODone (DESYREL) 50 MG tablet Take 1 tablet (50 mg total) by mouth at bedtime as needed for sleep. For insomnia Patient not taking: Reported on 01/21/2017 07/28/16   Denzil Magnuson, NP    Family History No family history on file.  Social History Social History   Tobacco Use  . Smoking status: Current Every Day Smoker    Packs/day: 1.00    Years: 0.00    Pack years: 0.00    Types: Cigarettes, Cigars  . Smokeless tobacco: Never Used  Substance Use Topics  . Alcohol use: No  . Drug use: No    Comment: last use October 2017     Allergies   Haldol [haloperidol lactate]   Review of Systems Review of Systems  Constitutional: Negative for chills, diaphoresis and fever.  HENT: Negative for ear discharge, ear pain, rhinorrhea and sore throat.   Respiratory: Positive for chest tightness, shortness of breath and wheezing. Negative for cough.   Cardiovascular: Positive for chest pain (tightness). Negative for leg swelling.  Gastrointestinal: Negative for abdominal pain, constipation, diarrhea, nausea and vomiting.  Genitourinary: Negative for dysuria and hematuria.  Musculoskeletal: Negative for arthralgias and myalgias.  Skin: Negative for color change.  Allergic/Immunologic: Negative for immunocompromised state.  Neurological: Negative for weakness, light-headedness and numbness.  Psychiatric/Behavioral: Negative for confusion.   All other systems reviewed and are negative for acute change except as noted in the HPI.    Physical Exam Updated Vital Signs BP (!) 159/89 (BP Location: Left Arm)   Pulse 90   Temp 98.7 F (37.1 C)   Resp 18   SpO2 100%   Physical Exam  Constitutional: He is oriented to person, place, and time. Vital signs are normal. He appears well-developed and well-nourished.   Non-toxic appearance. No distress.  Afebrile, nontoxic, NAD  HENT:  Head: Normocephalic and atraumatic.  Mouth/Throat: Oropharynx is clear and moist and mucous membranes are normal.  Eyes: Conjunctivae and EOM are normal. Right eye exhibits no discharge. Left eye exhibits no discharge.  Neck: Normal range of motion. Neck supple.  Cardiovascular: Normal rate, regular rhythm, normal heart sounds and intact distal pulses. Exam reveals no gallop and no friction rub.  No murmur heard. RRR, nl s1/s2, no m/r/g, distal pulses intact, no pedal edema   Pulmonary/Chest: Effort normal. No respiratory distress. He has decreased breath sounds. He has no wheezes. He has no rhonchi. He has no rales. He exhibits tenderness. He exhibits no crepitus, no deformity and no retraction.  Slightly diminished lung sounds throughout although difficult to assess due to poor inspiratory effort; otherwise, CTAB in all lung fields, no w/r/r appreciated, no hypoxia or increased WOB, speaking in full sentences, SpO2 100% on RA Chest wall with mild TTP over sternum, without crepitus, deformities, or retractions     Abdominal: Soft. Normal appearance and bowel sounds are normal. He exhibits no distension. There is no tenderness. There is no rigidity, no rebound, no guarding, no CVA tenderness, no tenderness at McBurney's point  and negative Murphy's sign.  Musculoskeletal: Normal range of motion.  MAE x4 Strength and sensation grossly intact in all extremities Distal pulses intact Gait steady No pedal edema, neg homan's bilaterally   Neurological: He is alert and oriented to person, place, and time. He has normal strength. No sensory deficit.  Skin: Skin is warm, dry and intact. No rash noted.  Psychiatric: He has a normal mood and affect.  Nursing note and vitals reviewed.    ED Treatments / Results  Labs (all labs ordered are listed, but only abnormal results are displayed) Labs Reviewed - No data to display  EKG   EKG Interpretation  Date/Time:  Tuesday January 25 2017 18:53:51 EST Ventricular Rate:  79 PR Interval:    QRS Duration: 76 QT Interval:  365 QTC Calculation: 419 R Axis:   67 Text Interpretation:  Sinus rhythm Consider left ventricular hypertrophy Confirmed by Benjiman CorePickering, Nathan [phone]([id number]) on 01/25/2017 7:06:21 PM       Radiology Dg Chest 2 View  Result Date: 01/25/2017 CLINICAL DATA:  Shortness of breath, chest pain. EXAM: CHEST  2 VIEW COMPARISON:  Radiographs of January 21, 2017. FINDINGS: The heart size and mediastinal contours are within normal limits. Both lungs are clear. No pneumothorax or pleural effusion is noted. The visualized skeletal structures are unremarkable. IMPRESSION: No active cardiopulmonary disease. Electronically Signed   By: Lupita RaiderJames  Green Jr, M.D.   On: 01/25/2017 18:58    Procedures Procedures (including critical care time)  Medications Ordered in ED Medications  albuterol (PROVENTIL) (2.5 MG/3ML) 0.083% nebulizer solution 5 mg (5 mg Nebulization Given 01/25/17 1859)  ipratropium (ATROVENT) nebulizer solution 0.5 mg (0.5 mg Nebulization Given 01/25/17 1900)     Initial Impression / Assessment and Plan / ED Course  I have reviewed the triage vital signs and the nursing notes.  Pertinent labs & imaging results that were available during my care of the patient were reviewed by me and considered in my medical decision making (see chart for details).     41 y.o. male here with CP, SOB, and wheezing x1hr that improved with his albuterol inhaler. States it feels like prior asthma issues. On exam, no tachycardia or hypoxia, no pedal edema, chest wall tenderness reproducing pain, mildly diminished lung sounds although hard to tell since pt with poor inspiratory effort. Will get CXR and EKG, doubt need for labs for now, likely asthma attack; will give duoneb, will hold off on prednisone for now. Will reassess shortly.  8:29 PM Pt feeling better and lung sounds  improved after duoneb. CXR negative. EKG unchanged from prior and without acute ischemic findings. Doubt need for further emergent work up at this time, symptoms likely related to asthma. Doubt need for steroids. Will send home with instructions to use inhaler, advised use of daily antihistamine, and other OTC remedies for symptomatic relief. F/up with [facility] in 5-7 days for recheck of symptoms and to establish medical care. Smoking cessation strongly advised. List of shelters also given. I explained the diagnosis and have given explicit precautions to return to the ER including for any other new or worsening symptoms. The patient understands and accepts the medical plan as it's been dictated and I have answered their questions. Discharge instructions concerning home care and prescriptions have been given. The patient is STABLE and is discharged to home in good condition.    Final Clinical Impressions(s) / ED Diagnoses   Final diagnoses:  Mild intermittent asthma with exacerbation  Chest wall pain  Homelessness  Tobacco user    ED Discharge Orders    [address]       Jerred Zaremba, GoldenMercedes, [place] JerseyPA-C 01/25/17 2030    Benjiman CorePickering, Nathan, MD 01/25/17 [phone]

## 2017-01-25 NOTE — Discharge Instructions (Signed)
Continue to stay well-hydrated. Alternate between tylenol and motrin as needed for pain. May consider using heat or vick's vaporub to your chest to help with pain and symptoms. Use Mucinex for cough suppression/expectoration of mucus. Use over the counter antihistamines such as zyrtec, claritin, or allegra to decrease symptoms and frequency of asthma attacks. Use inhaler as directed, as needed for cough/chest congestion/wheezing/shortness of breath/etc. STOP SMOKING! [facility]-7 days for recheck of ongoing symptoms and to establish medical care. Return to emergency department for emergent changing or worsening of symptoms.   Use the list of shelters below to find a place to stay.

## 2017-01-25 NOTE — ED Triage Notes (Addendum)
Per EMS, patient c/o chest wall pain after going for a walk.   Patient reports "getting winded" after going for a walk. Reports using inhaler with some relief. Speaking in full sentences without difficulty. Hx asthma. Lung sounds clear in triage.  BP 154/94 HR 72 O2 99

## 2017-01-26 ENCOUNTER — Encounter (HOSPITAL_COMMUNITY): Payer: Self-pay

## 2017-01-26 ENCOUNTER — Emergency Department (HOSPITAL_COMMUNITY)
Admission: EM | Admit: 2017-01-26 | Discharge: 2017-01-26 | Disposition: A | Discharge status: Home / Self Care | Payer: Self-pay | Attending: Emergency Medicine | Admitting: Emergency Medicine

## 2017-01-26 ENCOUNTER — Encounter (HOSPITAL_COMMUNITY): Payer: Self-pay | Admitting: Emergency Medicine

## 2017-01-26 DIAGNOSIS — F99 Mental disorder, not otherwise specified: Secondary | ICD-10-CM | POA: Insufficient documentation

## 2017-01-26 DIAGNOSIS — F918 Other conduct disorders: Secondary | ICD-10-CM | POA: Insufficient documentation

## 2017-01-26 DIAGNOSIS — J45909 Unspecified asthma, uncomplicated: Secondary | ICD-10-CM | POA: Insufficient documentation

## 2017-01-26 DIAGNOSIS — R0789 Other chest pain: Secondary | ICD-10-CM | POA: Insufficient documentation

## 2017-01-26 DIAGNOSIS — Z79899 Other long term (current) drug therapy: Secondary | ICD-10-CM | POA: Insufficient documentation

## 2017-01-26 DIAGNOSIS — R4689 Other symptoms and signs involving appearance and behavior: Secondary | ICD-10-CM | POA: Insufficient documentation

## 2017-01-26 DIAGNOSIS — R0602 Shortness of breath: Secondary | ICD-10-CM | POA: Insufficient documentation

## 2017-01-26 DIAGNOSIS — Z5321 Procedure and treatment not carried out due to patient leaving prior to being seen by health care provider: Secondary | ICD-10-CM | POA: Insufficient documentation

## 2017-01-26 DIAGNOSIS — Z590 Homelessness: Secondary | ICD-10-CM | POA: Insufficient documentation

## 2017-01-26 DIAGNOSIS — F17210 Nicotine dependence, cigarettes, uncomplicated: Secondary | ICD-10-CM | POA: Insufficient documentation

## 2017-01-26 DIAGNOSIS — F22 Delusional disorders: Secondary | ICD-10-CM | POA: Insufficient documentation

## 2017-01-26 DIAGNOSIS — G43909 Migraine, unspecified, not intractable, without status migrainosus: Secondary | ICD-10-CM | POA: Insufficient documentation

## 2017-01-26 NOTE — ED Notes (Signed)
Patient called to be placed in treatment room with no answer. 

## 2017-01-26 NOTE — ED Triage Notes (Signed)
Pt arrives via EMS with c/o SOB all day; pt has hx of asthma; pt refused any labs on arrival; pt states he was just here today; EMS states pt is homeless; pt arrives with aggressive behavior-Monique,RN

## 2017-01-26 NOTE — ED Triage Notes (Signed)
Pt comes in with recurrent complaints of blisters on his feet.  States he needs his feet soaked because they "stink". Pt ambulatory.  Also states he needs a refill on the powder they gave him for his feet.

## 2017-01-26 NOTE — ED Notes (Signed)
Called to be placed in room x3 with no answer. 

## 2017-01-26 NOTE — ED Notes (Signed)
Called to be placed in treatment room x2 with no answer.

## 2017-01-26 NOTE — ED Triage Notes (Signed)
During Triage pt was speaking aggressively and started telling staff about he was discriminated against in [employer] because he can't breath; he then states that "[place]!" "[place] broke, my people are going to take care of me!" Pt does not have any noted psy history but may need psy evaluation. Pt c/o chest pain and sob on arrival. Pt was seen earlier today for same; Phlebotomist unable to obtain blood at this time  -[facility],RN

## 2017-01-27 ENCOUNTER — Emergency Department (HOSPITAL_COMMUNITY)
Admission: EM | Admit: 2017-01-27 | Discharge: 2017-01-27 | Disposition: A | Discharge status: Home / Self Care | Payer: Self-pay | Attending: Emergency Medicine | Admitting: Emergency Medicine

## 2017-01-27 ENCOUNTER — Emergency Department (HOSPITAL_COMMUNITY): Payer: Self-pay

## 2017-01-27 DIAGNOSIS — R079 Chest pain, unspecified: Secondary | ICD-10-CM

## 2017-01-27 DIAGNOSIS — R4689 Other symptoms and signs involving appearance and behavior: Secondary | ICD-10-CM

## 2017-01-27 LAB — COMPREHENSIVE METABOLIC PANEL
ALBUMIN: 4 g/dL (ref 3.5–5.0)
ALK PHOS: 56 U/L (ref 38–126)
ALT: 13 U/L — AB (ref 17–63)
AST: 18 U/L (ref 15–41)
Anion gap: 7 (ref 5–15)
BILIRUBIN TOTAL: 0.6 mg/dL (ref 0.3–1.2)
BUN: 9 mg/dL (ref 6–20)
CALCIUM: 9.1 mg/dL (ref 8.9–10.3)
CO2: 25 mmol/L (ref 22–32)
Chloride: 107 mmol/L (ref 101–111)
Creatinine, Ser: 0.94 mg/dL (ref 0.61–1.24)
GFR calc Af Amer: >60 mL/min (ref >60)
GFR calc non Af Amer: >60 mL/min (ref >60)
GLUCOSE: 89 mg/dL (ref 65–99)
Potassium: 3.7 mmol/L (ref 3.5–5.1)
SODIUM: 139 mmol/L (ref 135–145)
TOTAL PROTEIN: 6.6 g/dL (ref 6.5–8.1)

## 2017-01-27 LAB — ACETAMINOPHEN LEVEL: Acetaminophen (Tylenol), Serum: <10 ug/mL — ABNORMAL LOW (ref 10–30)

## 2017-01-27 LAB — SALICYLATE LEVEL

## 2017-01-27 LAB — I-STAT TROPONIN, ED: Troponin i, poc: 0 ng/mL (ref 0.00–0.08)

## 2017-01-27 LAB — ETHANOL: Alcohol, Ethyl (B): <10 mg/dL (ref <10)

## 2017-01-27 MED ORDER — OLANZAPINE 5 MG PO TBDP: 5.0000 mg | ORAL_TABLET | Freq: Three times a day (TID) | ORAL | Status: DC | PRN

## 2017-01-27 MED ORDER — LORAZEPAM 1 MG PO TABS: 1.0000 mg | ORAL_TABLET | ORAL | Status: DC | PRN

## 2017-01-27 MED ORDER — ZIPRASIDONE MESYLATE 20 MG IM SOLR: 20.0000 mg | INTRAMUSCULAR | Status: DC | PRN

## 2017-01-27 MED ORDER — NICOTINE 21 MG/24HR TD PT24: 21.0000 mg | MEDICATED_PATCH | Freq: Every day | TRANSDERMAL | Status: DC

## 2017-01-27 MED ORDER — ALUM & MAG HYDROXIDE-SIMETH 200-200-20 MG/5ML PO SUSP: 30.0000 mL | Freq: Four times a day (QID) | ORAL | Status: DC | PRN

## 2017-01-27 MED ORDER — ACETAMINOPHEN 325 MG PO TABS: 650.0000 mg | ORAL_TABLET | ORAL | Status: DC | PRN

## 2017-01-27 MED ORDER — ONDANSETRON HCL 4 MG PO TABS: 4.0000 mg | ORAL_TABLET | Freq: Three times a day (TID) | ORAL | Status: DC | PRN

## 2017-01-27 MED ORDER — OLANZAPINE 10 MG PO TBDP
10.0000 mg | ORAL_TABLET | Freq: Once | ORAL | Status: AC
  Administered 2017-01-27: 10 mg via ORAL
  Filled 2017-01-27: qty 1

## 2017-01-27 NOTE — ED Notes (Signed)
Pt states "I wish I had a rope, tie it around somebody neck and [place] [facility] til [place] shed to pieces; I hope everybody die; I get up in the morning and light a cigarette and everybody dead!"  "I haven't slept in 30 days!"-Monique,RN

## 2017-01-27 NOTE — ED Notes (Signed)
Patient transported to X-ray 

## 2017-01-27 NOTE — Progress Notes (Signed)
TTS attempted to call the cart but did not get an answer. Called back to nurse station and spoke with "Verlon AuLeslie" who stated the cart is not in the room. TTS to complete assessment once the cart has been placed in the pt's room and turned on for the assessment.  Princess BruinsAquicha Kimo Bancroft, MSW, LCSW Therapeutic Triage Specialist  [phone]

## 2017-01-27 NOTE — Progress Notes (Signed)
TTS did not receive a call from the pt's nurse. It is unknown if "Verlon AuLeslie" ever told the pt's nurse to contact TTS regarding her pt.   Princess BruinsAquicha Ashar Lewinski, MSW, LCSW Therapeutic Triage Specialist  [phone]

## 2017-01-27 NOTE — ED Provider Notes (Signed)
[facility] Provider Note   CSN: [id number] Arrival date & time: 01/26/17  2325     History   Chief Complaint Chief Complaint  Patient presents with  . Shortness of Breath  . Chest Pain    HPI Dustin Norris is a 41 y.o. male.  41 year old male with past medical history including asthma, anxiety, homelessness, migraines, substance abuse who presents with multiple complaints.  The patient checked into the ED earlier today with complaints of his feet having blisters and stinking.  He eloped and returned by EMS with complaint of SOB all day and chest pain. For me, he states he has a migraine and CP that began 20 minutes ago. He later states he is not sure why he's here. He told triage "[place] going [place]! Everyone in [place] is broke, my people are going to take care of me." He later called me a "dumbass bitch" and told me "I hope you make it home alive tonight."  LEVEL 5 CAVEAT DUE TO PSYCHIATRIC ILLNESS   The history is provided by the patient.  Shortness of Breath  Associated symptoms include chest pain.  Chest Pain   Associated symptoms include shortness of breath.    Past Medical History:  Diagnosis Date  . Anxiety   . Asthma   . Cocaine abuse (HCC)   . Homelessness   . Migraine   . Tobacco abuse     Patient Active Problem List   Diagnosis Date Noted  . 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 Cocaine use disorder, moderate, dependence (HCC) 07/26/2016  . 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 Psychosis (HCC) 07/24/2016  . Chest pain 11/23/2015  . Asthma 11/23/2015  . Tobacco abuse 11/23/2015  . Elevated lactic acid level 11/23/2015  . Cocaine abuse (HCC)   . Concern about STD in male without diagnosis   . Paranoid ideation (HCC) 02/22/2015    History reviewed. No pertinent surgical history.     Home Medications    Prior to  Admission medications   Medication Sig Start Date End Date Taking? Authorizing Provider  acetaminophen (TYLENOL) 500 MG tablet Take 500 mg by mouth every 6 (six) hours as needed for mild pain or headache.    [provider]  albuterol (PROVENTIL HFA;VENTOLIN HFA) 108 (90 Base) MCG/ACT inhaler Inhale 2 puffs into the lungs every 4 (four) hours as needed for wheezing or shortness of breath. 07/31/16   Muthersbaugh, Dahlia ClientHannah, PA-C  hydrOXYzine (ATARAX/VISTARIL) 25 MG tablet Take 1 tablet (25 mg total) by mouth 3 (three) times daily as needed for anxiety. Patient not taking: Reported on 01/21/2017 07/28/16   Denzil Magnusonhomas, Lashunda, NP  ibuprofen (ADVIL,MOTRIN) 600 MG tablet Take 1 tablet (600 mg total) by mouth every 6 (six) hours as needed. Patient not taking: Reported on 01/21/2017 12/24/16   Elpidio AnisUpstill, Shari, PA-C  ondansetron (ZOFRAN) 8 MG tablet Take 1 tablet (8 mg total) by mouth every 8 (eight) hours as needed for nausea or vomiting. Patient not taking: Reported on 01/21/2017 08/03/16   Street, GodleyMercedes, PA-C  traZODone (DESYREL) 50 MG tablet Take 1 tablet (50 mg total) by mouth at bedtime as needed for sleep. For insomnia Patient not taking: Reported on 01/21/2017 07/28/16   Denzil Magnusonhomas, Lashunda, NP    Family History History reviewed. No pertinent family history.  Social History Social History   Tobacco Use  . Smoking status: Current Every Day Smoker    Packs/day:  1.00    Years: 0.00    Pack years: 0.00    Types: Cigarettes, Cigars  . Smokeless tobacco: Never Used  Substance Use Topics  . Alcohol use: Yes  . Drug use: No    Comment: last use October 2017     Allergies   Haldol [haloperidol lactate]   Review of Systems Review of Systems  Unable to perform ROS: Psychiatric disorder  Respiratory: Positive for shortness of breath.   Cardiovascular: Positive for chest pain.     Physical Exam Updated Vital Signs BP (!) 138/98 (BP Location: Right Arm)   Pulse 89   Temp 98.4 F  (36.9 C) (Oral)   Resp 16   SpO2 98%   Physical Exam  Constitutional: He is oriented to person, place, and time. He appears well-developed and well-nourished. No distress.  HENT:  Head: Normocephalic and atraumatic.  Eyes: Conjunctivae are normal.  Neck: Neck supple.  Cardiovascular: Normal rate, regular rhythm and normal heart sounds.  No murmur heard. Pulmonary/Chest: Effort normal and breath sounds normal.  Abdominal: Soft. Bowel sounds are normal. He exhibits no distension. There is no tenderness.  Musculoskeletal: He exhibits no edema.  Neurological: He is alert and oriented to person, place, and time.  Fluent speech  Skin: Skin is warm and dry.  Psychiatric: His affect is angry and labile. His speech is rapid and/or pressured. He is agitated. Thought content is paranoid and delusional. He expresses inappropriate judgment.  Angry, aggressive behavior and gestures, yelling obscenities   Nursing note and vitals reviewed.    ED Treatments / Results  Labs (all labs ordered are listed, but only abnormal results are displayed) Labs Reviewed  COMPREHENSIVE METABOLIC PANEL - Abnormal; Notable for the following components:      Result Value   ALT 13 (*)    All other components within normal limits  ACETAMINOPHEN LEVEL - Abnormal; Notable for the following components:   Acetaminophen (Tylenol), Serum <10 (*)    All other components within normal limits  ETHANOL  SALICYLATE LEVEL  CBC  RAPID URINE DRUG SCREEN, HOSP PERFORMED  I-STAT TROPONIN, ED    EKG  EKG Interpretation  Date/Time:  Wednesday January 26 2017 23:33:27 EST Ventricular Rate:  86 PR Interval:  134 QRS Duration: 76 QT Interval:  384 QTC Calculation: 459 R Axis:   81 Text Interpretation:  Normal sinus rhythm with sinus arrhythmia Normal ECG similar to previous tracings Confirmed by Frederick PeersLittle, Melven Stockard [phone]([id number]) on 01/27/2017 2:01:31 AM Also confirmed by Frederick PeersLittle, Presly Steinruck [phone]), editor Elita QuickWatlington, [place] ([id number])   on 01/27/2017 6:43:09 AM       Radiology Dg Chest 2 View  Result Date: 01/27/2017 CLINICAL DATA:  Acute onset of generalized chest pain and shortness of breath. EXAM: CHEST  2 VIEW COMPARISON:  Chest radiograph performed 01/25/2017 FINDINGS: The lungs are well-aerated and clear. There is no evidence of focal opacification, pleural effusion or pneumothorax. The heart is normal in size; the mediastinal contour is within normal limits. No acute osseous abnormalities are seen. IMPRESSION: No acute cardiopulmonary process seen. Electronically Signed   By: Roanna RaiderJeffery  Chang M.D.   On: 01/27/2017 00:22   Dg Chest 2 View  Result Date: 01/25/2017 CLINICAL DATA:  Shortness of breath, chest pain. EXAM: CHEST  2 VIEW COMPARISON:  Radiographs of January 21, 2017. FINDINGS: The heart size and mediastinal contours are within normal limits. Both lungs are clear. No pneumothorax or pleural effusion is noted. The visualized skeletal structures are unremarkable. IMPRESSION: No active cardiopulmonary  disease. Electronically Signed   By: Lupita Raider, M.D.   On: 01/25/2017 18:58    Procedures Procedures (including critical care time)  Medications Ordered in ED Medications  OLANZapine zydis (ZYPREXA) disintegrating tablet 5 mg (not administered)    And  LORazepam (ATIVAN) tablet 1 mg (not administered)    And  ziprasidone (GEODON) injection 20 mg (not administered)  acetaminophen (TYLENOL) tablet 650 mg (not administered)  nicotine (NICODERM CQ - dosed in mg/24 hours) patch 21 mg (not administered)  alum & mag hydroxide-simeth (MAALOX/MYLANTA) 200-200-20 MG/5ML suspension 30 mL (not administered)  ondansetron (ZOFRAN) tablet 4 mg (not administered)  OLANZapine zydis (ZYPREXA) disintegrating tablet 10 mg (10 mg Oral Given 01/27/17 0405)     Initial Impression / Assessment and Plan / ED Course  I have reviewed the triage vital signs and the nursing notes.  Pertinent imaging results that were available  during my care of the patient were reviewed by me and considered in my medical decision making (see chart for details).    PT here with multiple changing complaints, at one point CP and SOB, later w/ foot problems. He was angry, aggressive, and making multiple threats to harm myself and staff. Eventually it became evident that he likely has underlying psychiatric disorder and his chart does show h/o the same. Therefore completed IVC due to concern for his threat to others. Labs unremarkable, CXR normal. Pt medically clear. I have contacted TTS. Had to give zyprexa for agitation, he was too sleepy for initial TTS eval so they will return. Dispo pending psychiatry team recommendations.  Final Clinical Impressions(s) / ED Diagnoses   Final diagnoses:  Aggressive behavior of adult  Chest pain, unspecified type    ED Discharge Orders    None       Will Schier, Ambrose Finland, MD 01/27/17 [phone]

## 2017-01-27 NOTE — Progress Notes (Signed)
TTS contacted Monique, RN and requested to set up the telepsych cart in order to complete the assessment.  Princess BruinsAquicha Shyloh Krinke, MSW, LCSW Therapeutic Triage Specialist  [phone]

## 2017-01-27 NOTE — ED Notes (Signed)
TTS in process 

## 2017-01-27 NOTE — ED Notes (Signed)
Pt discharged with belongings bags, backback and bag of lock-up items from security. All items accounted for prior to D/c. Pt requested 2 bus passes from SW, passes given to Nurse First. Pt refused to sign and refused to take discharge paperwork or resource pages. Pt was displeased with decision to d/c but eventually complied with RN instructions to leave the room. Pt now sitting in lobby. Nurse first notified of pt status and dispo.

## 2017-01-27 NOTE — Progress Notes (Signed)
TTS calling cart 1 and cart 2 multiple times repeatedly in an attempt to reach the pt. Cart 1 does not ring and a message appears on the screen indicating "cart 1 could not be reached. Please try again later." Cart 2 continues to ring for several minutes with no answer. TTS contacted the pt's nurse again to request the cart be placed in the pt's room and turned on. Verlon AuLeslie states she will check on the cart.  Princess BruinsAquicha Osborne Serio, MSW, LCSW Therapeutic Triage Specialist  [phone]

## 2017-01-27 NOTE — ED Notes (Signed)
Pt cooperative and requesting food and warm blanket. Pt able to ambulate from stretcher to new bed. Pt appears to be sleeping with normal respirations at this time.

## 2017-01-27 NOTE — ED Notes (Signed)
Patient in room talking to self; pt states "I'll kill all ya'll Mfers!"

## 2017-01-27 NOTE — BH Assessment (Signed)
Progress Note- Per Shuvon Rankin, NP pt does not meet inpatient criteria and recommended for discharge. Pt's nurse and provider notified. Resources faxed to nurse PrincetonNikki at [phone].

## 2017-01-27 NOTE — Progress Notes (Signed)
Pt continued falling asleep when TTS attempted to complete the assessment. Pt's name was called multiple times to no avail. Pt's nurse attempted to wake the pt but he did not wake up. TTS to complete assessment once the pt is alert.   Dustin Norris, MSW, LCSW Therapeutic Triage Specialist  [phone]'

## 2017-01-27 NOTE — Progress Notes (Signed)
TTS unable to complete the assessment due to the pt continuing to fall asleep. TTS attempted to contact the pt's nurse to advise that the assessment could not be completed due to the pt falling asleep and not responding when his name was called. "Verlon AuLeslie" answered the phone and stated "she does not have a phone" when TTS asked if the nurse was available. "Verlon AuLeslie" stated "I will tell her to call you."   Princess BruinsAquicha Heyli Min, MSW, LCSW Therapeutic Triage Specialist  [phone]
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
## 2017-01-27 NOTE — ED Notes (Signed)
Primary RN attempted to call TTS back but received no answer; RN resulted to reading notes document by TTS; Pt is still sleep when RN attempted to speak with him.

## 2017-01-27 NOTE — BH Assessment (Signed)
Tele Assessment Note   Patient Name: Dustin Norris MRN: [id number] Referring Physician: Laurence Spates, MD Location of Patient: [facility] Location of Provider: Behavioral Health TTS Department  Dustin Norris is an 41 y.o. male presents voluntarily to [facility] alone.   Per Dr. Ambrose Finland Little  "41 year old male with past medical history including asthma, anxiety, homelessness, migraines, substance abuse who presents with multiple complaints.  The patient checked into the ED earlier today with complaints of his feet having blisters and stinking.  He eloped and returned by EMS with complaint of SOB all day and chest pain. For me, he states he has a migraine and CP that began 20 minutes ago. He later states he is not sure why he's here. He told triage "Everyone in [place] is going to die! Everyone in [place] is broke, my people are going to take care of me." He later called me a "dumbass bitch" and told me "I hope you make it home alive tonight.""  Pt reports he has thoughts of hurting himself "sometimes", and then stated he currently has SI but denies a plan. Pt denies homicidal thoughts or physical aggression. Pt denies having access to firearms. Pt denies having any legal problems at this time. Pt denies hallucinations. Pt does not appear to be responding to internal stimuli and exhibits no delusional thought. Pt's reality testing appears to be intact. Pt denies any current substance abuse problems. Pt does not appear to be intoxicated or in withdrawal at this time. Per record pt has hs with substance abuse with cocaine. Pt reports he is homeless and states "its hard to wake up because I have nowhere to stay". Pt denies any family support. Pt reports he was inpatient a year ago "somewhere". Pt has no psychiatrist or therapist currently.   Pt is dressed in hospital gown, sleeping, oriented x4 with slurred, and low speech. Eye contact is good and Pt is tearful. Pt's mood is depressed and  affect is anxious. Thought process is coherent and relevant. Pt's insight is good and judgement is fair. There is no indication Pt is currently responding to internal stimuli or experiencing delusional thought content.       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 Anxiety   . Asthma   . Cocaine abuse (HCC)   . Homelessness   . Migraine   . Tobacco abuse     History reviewed. No pertinent surgical history.  Family History: History reviewed. No pertinent family history.  Social History:  reports that he has been smoking cigarettes and cigars.  He has been smoking about 1.00 pack per day for the past 0.00 years. he has never used smokeless tobacco. He reports that he drinks alcohol. He reports that he does not use drugs.  Additional Social History:  Alcohol / Drug Use Pain Medications: SEE MAR Prescriptions: SEE MAR Over the Counter: SEE MAR History of alcohol / drug use?: Yes Longest period of sobriety (when/how long): cocaine  CIWA: CIWA-Ar BP: (!) 152/135(pt asleep and laying on left arm. ) Pulse Rate: 65 COWS:    PATIENT STRENGTHS: (choose at least two) Average or above average intelligence General fund of knowledge  Allergies:  Allergies  Allergen Reactions  . Haldol [Haloperidol Lactate] Anaphylaxis    Home Medications:  (Not in a hospital admission)  OB/GYN Status:  No LMP for male patient.  General Assessment Data Assessment unable to be completed: Yes  Reason for not completing assessment: Pt continued falling asleep when TTS attempted to complete the assessment. Pt's name was called multiple times to no avail. Pt's nurse attempted to wake the pt but he did not wake up. TTS to complete assessment once the pt is alert.  [facility] Assessment: In system Is this a Tele or Face-to-Face Assessment?: Tele Assessment Is this an Initial Assessment  or a Re-assessment for this encounter?: Initial Assessment Marital status: Single Is patient pregnant?: No Pregnancy Status: No Living Arrangements: Other (Comment)(Homeless) Admission Status: Voluntary Is patient capable of signing voluntary admission?: Yes Referral Source: Self/Family/Friend Insurance type: None  Medical Screening Exam [facility]) Medical Exam completed: Yes  Crisis Care Plan Living Arrangements: Other (Comment)(Homeless) Name of Psychiatrist: None Name of Therapist: None  Education Status Is patient currently in school?: No Highest grade of school patient has completed: 11  Risk to self with the past 6 months Suicidal Ideation: Yes-Currently Present(Passsive thoughts of harming himself) Has patient been a risk to self within the past 6 months prior to admission? : No Suicidal Intent: No Has patient had any suicidal intent within the past 6 months prior to admission? : No Is patient at risk for suicide?: No Suicidal Plan?: No Has patient had any suicidal plan within the past 6 months prior to admission? : No Access to Means: No What has been your use of drugs/alcohol within the last 12 months?: Cocaine Previous Attempts/Gestures: No Triggers for Past Attempts: Unknown Intentional Self Injurious Behavior: None Family Suicide History: No Recent stressful life event(s): Conflict (Comment), Other (Comment)(Homeless) Persecutory voices/beliefs?: No Depression: Yes Depression Symptoms: Feeling angry/irritable, Feeling worthless/self pity, Isolating Substance abuse history and/or treatment for substance abuse?: No Suicide prevention information given to non-admitted patients: Not applicable  Risk to Others within the past 6 months Homicidal Ideation: No Does patient have any lifetime risk of violence toward others beyond the six months prior to admission? : No Thoughts of Harm to Others: No Current Homicidal Intent: No Current Homicidal Plan:  No Access to Homicidal Means: No History of harm to others?: No Assessment of Violence: On admission Violent Behavior Description: Aggresive behavior to attending Does patient have access to weapons?: No Criminal Charges Pending?: No Does patient have a court date: No Is patient on probation?: No  Psychosis Hallucinations: None noted Delusions: None noted  Mental Status Report Appearance/Hygiene: Poor hygiene, In hospital gown Eye Contact: Poor(Pt kept falling asleep) Motor Activity: Unremarkable Speech: Soft, Logical/coherent, Slurred Level of Consciousness: Drowsy, Sleeping Mood: Depressed Affect: Flat Anxiety Level: None Thought Processes: Coherent, Relevant Judgement: Partial Orientation: Person, Place, Time, Situation, Appropriate for developmental age Obsessive Compulsive Thoughts/Behaviors: None  Cognitive Functioning Concentration: Normal Memory: Recent Intact IQ: Average Insight: Good Impulse Control: Fair Appetite: Good Weight Loss: 0 Weight Gain: 0 Sleep: Decreased Total Hours of Sleep: 4 Vegetative Symptoms: None  ADLScreening [facility] Assessment Services) Patient's cognitive ability adequate to safely complete daily activities?: Yes Patient able to express need for assistance with ADLs?: Yes Independently performs ADLs?: Yes (appropriate for developmental age)  Prior Inpatient Therapy Prior Inpatient Therapy: Yes Prior Therapy Dates: 2017 Prior [facility] Reason for Treatment: Depression  Prior Outpatient Therapy Prior Outpatient Therapy: No  ADL Screening (condition at time of admission) Patient's cognitive ability adequate to safely complete daily activities?: Yes Is the patient deaf or have difficulty hearing?: No Does the patient have difficulty seeing, even when wearing glasses/contacts?: No Does the patient have difficulty concentrating, remembering,  or making decisions?: No Patient able to express need for assistance  with ADLs?: Yes Does the patient have difficulty dressing or bathing?: No Independently performs ADLs?: Yes (appropriate for developmental age) Does the patient have difficulty walking or climbing stairs?: No Weakness of Legs: None Weakness of Arms/Hands: None       Abuse/Neglect Assessment (Assessment to be complete while patient is alone) Abuse/Neglect Assessment Can Be Completed: Yes Physical Abuse: Denies Verbal Abuse: Denies Sexual Abuse: Denies Exploitation of patient/patient's resources: Denies Self-Neglect: Denies Values / Beliefs Cultural Requests During Hospitalization: None Spiritual Requests During Hospitalization: None Consults Spiritual Care Consult Needed: No Social Work Consult Needed: No Merchant navy officerAdvance Directives (For Healthcare) Does Patient Have a Medical Advance Directive?: No Would patient like information on creating a medical advance directive?: No - Patient declined Nutrition Screen- [facility] Adult/[facility]/AP Patient's home diet: Regular  Additional Information 1:1 In Past 12 Months?: No CIRT Risk: No Elopement Risk: No Does patient have medical clearance?: No     Disposition:  Disposition Initial Assessment Completed for this Encounter: Yes Disposition of Patient: Outpatient treatment Type of outpatient treatment: Adult  Per Shuvon Rankin, NP pt does not meet inpatient criteria.  This service was provided via telemedicine using a 2-way, interactive audio and video technology.  Names of all persons participating in this telemedicine service and their role in this encounter. Name: Dustin MarvelLeonard Heide Role: Pt  Name: Danae OrleansVanessa Suhaylah [place], [place], [place] Role: Therapeutic Triage Specialist  Name:  Role:   Name:  Role:     Danae OrleansVanessa  Anahit Klumb, [place], LPCA 01/27/2017 10:23 AM

## 2017-01-27 NOTE — ED Notes (Signed)
Pt changed into burgundy scrubs, all belongings taken from pt and put in a bag at the nurses station

## 2017-01-27 NOTE — Progress Notes (Signed)
CSW provided pt with two bus passes as well as with shelter resources if needed. At this time there are no further CSW needs. CSW signing off.      Claude MangesKierra S. Shayaan Parke, MSW, LCSW-A Emergency Department Clinical Social Worker [phone]
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
## 2017-01-28 ENCOUNTER — Encounter (HOSPITAL_COMMUNITY): Payer: Self-pay | Admitting: Emergency Medicine

## 2017-01-28 ENCOUNTER — Other Ambulatory Visit: Payer: Self-pay

## 2017-01-28 DIAGNOSIS — Z5321 Procedure and treatment not carried out due to patient leaving prior to being seen by health care provider: Secondary | ICD-10-CM | POA: Insufficient documentation

## 2017-01-28 NOTE — ED Triage Notes (Signed)
Reports being seen on Monday for the same.  Blisters to both feet with itching.  Putting cream on feet from previous visit.

## 2017-01-29 ENCOUNTER — Emergency Department (HOSPITAL_COMMUNITY)
Admission: EM | Admit: 2017-01-29 | Discharge: 2017-01-29 | Disposition: A | Discharge status: Home / Self Care | Payer: Self-pay | Attending: Emergency Medicine | Admitting: Emergency Medicine

## 2017-01-29 ENCOUNTER — Encounter (HOSPITAL_COMMUNITY): Payer: Self-pay

## 2017-01-29 ENCOUNTER — Encounter (HOSPITAL_COMMUNITY): Payer: Self-pay | Admitting: *Deleted

## 2017-01-29 ENCOUNTER — Other Ambulatory Visit: Payer: Self-pay

## 2017-01-29 DIAGNOSIS — F17210 Nicotine dependence, cigarettes, uncomplicated: Secondary | ICD-10-CM | POA: Insufficient documentation

## 2017-01-29 DIAGNOSIS — X58XXXA Exposure to other specified factors, initial encounter: Secondary | ICD-10-CM | POA: Insufficient documentation

## 2017-01-29 DIAGNOSIS — J45909 Unspecified asthma, uncomplicated: Secondary | ICD-10-CM | POA: Insufficient documentation

## 2017-01-29 DIAGNOSIS — Z79899 Other long term (current) drug therapy: Secondary | ICD-10-CM | POA: Insufficient documentation

## 2017-01-29 DIAGNOSIS — S90822A Blister (nonthermal), left foot, initial encounter: Secondary | ICD-10-CM | POA: Insufficient documentation

## 2017-01-29 DIAGNOSIS — Z760 Encounter for issue of repeat prescription: Secondary | ICD-10-CM | POA: Insufficient documentation

## 2017-01-29 DIAGNOSIS — Y999 Unspecified external cause status: Secondary | ICD-10-CM | POA: Insufficient documentation

## 2017-01-29 DIAGNOSIS — Y939 Activity, unspecified: Secondary | ICD-10-CM | POA: Insufficient documentation

## 2017-01-29 DIAGNOSIS — Y929 Unspecified place or not applicable: Secondary | ICD-10-CM | POA: Insufficient documentation

## 2017-01-29 MED ORDER — ACETAMINOPHEN 325 MG PO TABS
650.0000 mg | ORAL_TABLET | Freq: Once | ORAL | Status: AC
  Administered 2017-01-29: 650 mg via ORAL
  Filled 2017-01-29: qty 2

## 2017-01-29 MED ORDER — ALBUTEROL SULFATE HFA 108 (90 BASE) MCG/ACT IN AERS
2.0000 | INHALATION_SPRAY | RESPIRATORY_TRACT | Status: DC | PRN
  Administered 2017-01-29: 2 via RESPIRATORY_TRACT
  Filled 2017-01-29: qty 6.7

## 2017-01-29 MED ORDER — TERBINAFINE HCL 1 % EX CREA: 1.0000 "application " | TOPICAL_CREAM | Freq: Two times a day (BID) | CUTANEOUS | 0 refills | Status: DC

## 2017-01-29 NOTE — ED Triage Notes (Signed)
Pt presents for evaluation of "athletes foot" to bilateral feet x "couple days." pt reports pain and itching.

## 2017-01-29 NOTE — ED Triage Notes (Signed)
PT reports he does not have pain ,just needs his inhaler

## 2017-01-29 NOTE — ED Provider Notes (Signed)
[facility] Provider Note   CSN: [id number] Arrival date & time: 01/29/17  1523     History   Chief Complaint Chief Complaint  Patient presents with  . Medication Refill    HPI Dustin Norris is a 41 y.o. male.  HPI   41 year old male with history of psychosis, substance abuse, homelessness, paranoia requesting for for refill of his albuterol inhaler.  Patient report he has been without his inhaler for the past 1 day.  States he used albuterol inhaler daily.  Denies any shortness of breath or fever.  Patient was seen in the ER earlier by me for complaints of blisters.  He was treated and discharged.  Patient however threw away his discharge papers and prescription and return to the ER with this complaint.  Past Medical History:  Diagnosis Date  . Anxiety   . Asthma   . Cocaine abuse (HCC)   . Homelessness   . Migraine   . Tobacco abuse     Patient Active Problem List   Diagnosis Date Noted  . 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 Cocaine use disorder, moderate, dependence (HCC) 07/26/2016  . 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 Psychosis (HCC) 07/24/2016  . Chest pain 11/23/2015  . Asthma 11/23/2015  . Tobacco abuse 11/23/2015  . Elevated lactic acid level 11/23/2015  . Cocaine abuse (HCC)   . Concern about STD in male without diagnosis   . Paranoid ideation (HCC) 02/22/2015    History reviewed. No pertinent surgical history.     Home Medications    Prior to Admission medications   Medication Sig Start Date End Date Taking? Authorizing Provider  acetaminophen (TYLENOL) 500 MG tablet Take 500 mg by mouth every 6 (six) hours as needed for mild pain or headache.    [provider]  albuterol (PROVENTIL HFA;VENTOLIN HFA) 108 (90 Base) MCG/ACT inhaler Inhale 2 puffs into the lungs every 4 (four) hours as needed for wheezing or shortness of breath.  07/31/16   Muthersbaugh, Dahlia ClientHannah, PA-C  terbinafine (LAMISIL AT) 1 % cream Apply 1 application topically 2 (two) times daily. 01/29/17   Fayrene Helperran, Kristyna Bradstreet, PA-C    Family History No family history on file.  Social History Social History   Tobacco Use  . Smoking status: Current Every Day Smoker    Packs/day: 1.00    Years: 0.00    Pack years: 0.00    Types: Cigarettes, Cigars  . Smokeless tobacco: Never Used  Substance Use Topics  . Alcohol use: Yes  . Drug use: No    Comment: last use October 2017     Allergies   Haldol [haloperidol lactate]   Review of Systems Review of Systems  Constitutional: Negative for fatigue and fever.  Respiratory: Negative for shortness of breath.   All other systems reviewed and are negative.    Physical Exam Updated Vital Signs BP (!) 150/90 (BP Location: Right Arm)   Pulse (!) 109   Temp 99 F (37.2 C) (Oral)   Resp 16   SpO2 100%   Physical Exam  Constitutional: He is oriented to person, place, and time. He appears well-developed and well-nourished. No distress.  HENT:  Head: Atraumatic.  Eyes: Conjunctivae are normal.  Neck: Neck supple.  Cardiovascular: Normal rate and regular rhythm.  Pulmonary/Chest: Effort normal and breath sounds normal. He has no wheezes.  Neurological: He is alert and oriented to person, place, and time.  Skin: No  rash noted.  Psychiatric: He has a normal mood and affect.  Nursing note and vitals reviewed.    ED Treatments / Results  Labs (all labs ordered are listed, but only abnormal results are displayed) Labs Reviewed - No data to display  EKG  EKG Interpretation None       Radiology No results found.  Procedures Procedures (including critical care time)  Medications Ordered in ED Medications  albuterol (PROVENTIL HFA;VENTOLIN HFA) 108 (90 Base) MCG/ACT inhaler 2 puff (not administered)     Initial Impression / Assessment and Plan / ED Course  I have reviewed the triage vital signs  and the nursing notes.  Pertinent labs & imaging results that were available during my care of the patient were reviewed by me and considered in my medical decision making (see chart for details).     BP (!) 150/90 (BP Location: Right Arm)   Pulse (!) 109   Temp 99 F (37.2 C) (Oral)   Resp 16   SpO2 100%    Final Clinical Impressions(s) / ED Diagnoses   Final diagnoses:  Encounter for medication refill    ED Discharge Orders    None     4:08 PM Pt requesting for albuterol inhaler.  Pt uses inhaler daily, ran out of it yesterday.  No evidence of airway compromise.  Inhaler provided.     Fayrene Helperran, Ericia Moxley, PA-C 01/29/17 1631    Arby BarrettePfeiffer, Marcy, MD 01/30/17 [phone]

## 2017-01-29 NOTE — Discharge Instructions (Signed)
Please change your socks regularly to prevent moisture build up which can cause blisters.  If you notice itchiness between your toes then use Lamisil cream as needed to treat for potential athlete foot.

## 2017-01-29 NOTE — ED Provider Notes (Signed)
[facility] Provider Note   CSN: [id number] Arrival date & time: 01/29/17  [id number]     History   Chief Complaint Chief Complaint  Patient presents with  . Foot Pain    HPI Dustin Norris is a 41 y.o. male.  HPI   41 year old male with history of psychosis, substance abuse, homelessness, paranoia presenting complaining of foot discomfort. Pt sts he notices several blisters to the sole of his left foot for the past 2-3 days.  When asked if it's painful or itchy pt denies.  No specific treatment tried.  No new shoes.  Does admits to "walking a lot". No fever or numbness.  No rash.    Past Medical History:  Diagnosis Date  . Anxiety   . Asthma   . Cocaine abuse (HCC)   . Homelessness   . Migraine   . Tobacco abuse     Patient Active Problem List   Diagnosis Date Noted  . 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 Cocaine use disorder, moderate, dependence (HCC) 07/26/2016  . 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 Psychosis (HCC) 07/24/2016  . Chest pain 11/23/2015  . Asthma 11/23/2015  . Tobacco abuse 11/23/2015  . Elevated lactic acid level 11/23/2015  . Cocaine abuse (HCC)   . Concern about STD in male without diagnosis   . Paranoid ideation (HCC) 02/22/2015    History reviewed. No pertinent surgical history.     Home Medications    Prior to Admission medications   Medication Sig Start Date End Date Taking? Authorizing Provider  acetaminophen (TYLENOL) 500 MG tablet Take 500 mg by mouth every 6 (six) hours as needed for mild pain or headache.    [provider]  albuterol (PROVENTIL HFA;VENTOLIN HFA) 108 (90 Base) MCG/ACT inhaler Inhale 2 puffs into the lungs every 4 (four) hours as needed for wheezing or shortness of breath. 07/31/16   Muthersbaugh, Dahlia ClientHannah, PA-C  hydrOXYzine (ATARAX/VISTARIL) 25 MG tablet Take 1 tablet (25 mg total) by mouth 3 (three)  times daily as needed for anxiety. Patient not taking: Reported on 01/21/2017 07/28/16   Denzil Magnusonhomas, Lashunda, NP  ibuprofen (ADVIL,MOTRIN) 600 MG tablet Take 1 tablet (600 mg total) by mouth every 6 (six) hours as needed. Patient not taking: Reported on 01/21/2017 12/24/16   Elpidio AnisUpstill, Shari, PA-C  ondansetron (ZOFRAN) 8 MG tablet Take 1 tablet (8 mg total) by mouth every 8 (eight) hours as needed for nausea or vomiting. Patient not taking: Reported on 01/21/2017 08/03/16   Street, Sugar GroveMercedes, PA-C  traZODone (DESYREL) 50 MG tablet Take 1 tablet (50 mg total) by mouth at bedtime as needed for sleep. For insomnia Patient not taking: Reported on 01/21/2017 07/28/16   Denzil Magnusonhomas, Lashunda, NP    Family History No family history on file.  Social History Social History   Tobacco Use  . Smoking status: Current Every Day Smoker    Packs/day: 1.00    Years: 0.00    Pack years: 0.00    Types: Cigarettes, Cigars  . Smokeless tobacco: Never Used  Substance Use Topics  . Alcohol use: Yes  . Drug use: No    Comment: last use October 2017     Allergies   Haldol [haloperidol lactate]   Review of Systems Review of Systems  Constitutional: Negative for fever.  Skin: Negative for rash.     Physical Exam Updated Vital Signs BP 106/79 (BP Location: Right Arm)   Pulse (!) 59  Temp 97.9 F (36.6 C) (Oral)   Resp 16   SpO2 100%   Physical Exam  Constitutional: He appears well-developed and well-nourished. No distress.  HENT:  Head: Atraumatic.  Eyes: Conjunctivae are normal.  Neck: Neck supple.  Neurological: He is alert.  Skin: No rash noted.  L foot: small non infected blisters noted to the ball of the foot without tenderness.  Brisk cap refills to all toes, no deformity.  DP pulse palpable.   Psychiatric: He has a normal mood and affect.  Nursing note and vitals reviewed.    ED Treatments / Results  Labs (all labs ordered are listed, but only abnormal results are displayed) Labs  Reviewed - No data to display  EKG  EKG Interpretation None       Radiology No results found.  Procedures Procedures (including critical care time)  Medications Ordered in ED Medications - No data to display   Initial Impression / Assessment and Plan / ED Course  I have reviewed the triage vital signs and the nursing notes.  Pertinent labs & imaging results that were available during my care of the patient were reviewed by me and considered in my medical decision making (see chart for details).     BP 106/79 (BP Location: Right Arm)   Pulse (!) 59   Temp 97.9 F (36.6 C) (Oral)   Resp 16   SpO2 100%    Final Clinical Impressions(s) / ED Diagnoses   Final diagnoses:  Blister of left foot, initial encounter    ED Discharge Orders        Ordered    terbinafine (LAMISIL AT) 1 % cream  2 times daily     01/29/17 1013     10:18 AM Pt c/o blisters to sole of L foot.  It does not appear infected.  No significant tenderness on palpation.  Recommend change socks daily to prevent moisture build up.  Prescribe Lamisil as needed for tinea pedis.     Fayrene Helperran, Lasean Rahming, PA-C 01/29/17 1018    Arby BarrettePfeiffer, Marcy, MD 01/29/17 1029

## 2017-01-29 NOTE — ED Notes (Signed)
Pt requesting meal and bus pass. Given to patient. Pt threw discharge papers and prescription in trash upon discharge.

## 2017-01-29 NOTE — ED Notes (Signed)
Pt LWBS 

## 2017-01-29 NOTE — ED Triage Notes (Signed)
To ED with request of inhaler refill. States he has been without it for a day. Uses daily. No resp distress noted. Pt appears comfortable

## 2017-01-29 NOTE — ED Notes (Signed)
Declined W/C at D/C and was escorted to lobby by RN. 

## 2017-01-30 ENCOUNTER — Encounter (HOSPITAL_COMMUNITY): Payer: Self-pay | Admitting: Emergency Medicine

## 2017-01-30 ENCOUNTER — Emergency Department (HOSPITAL_COMMUNITY): Admission: EM | Admit: 2017-01-30 | Discharge: 2017-01-30 | Discharge status: Against Medical Advice | Payer: Self-pay

## 2017-01-30 ENCOUNTER — Emergency Department (HOSPITAL_COMMUNITY)
Admission: EM | Admit: 2017-01-30 | Discharge: 2017-01-30 | Disposition: A | Discharge status: Home / Self Care | Payer: Self-pay | Attending: Emergency Medicine | Admitting: Emergency Medicine

## 2017-01-30 ENCOUNTER — Other Ambulatory Visit: Payer: Self-pay

## 2017-01-30 DIAGNOSIS — J45909 Unspecified asthma, uncomplicated: Secondary | ICD-10-CM | POA: Insufficient documentation

## 2017-01-30 DIAGNOSIS — F17210 Nicotine dependence, cigarettes, uncomplicated: Secondary | ICD-10-CM | POA: Insufficient documentation

## 2017-01-30 DIAGNOSIS — R519 Headache, unspecified: Secondary | ICD-10-CM

## 2017-01-30 DIAGNOSIS — R51 Headache: Secondary | ICD-10-CM | POA: Insufficient documentation

## 2017-01-30 MED ORDER — ASPIRIN-ACETAMINOPHEN-CAFFEINE 250-250-65 MG PO TABS
1.0000 | ORAL_TABLET | Freq: Once | ORAL | Status: AC
  Administered 2017-01-30: 1 via ORAL
  Filled 2017-01-30: qty 1

## 2017-01-30 NOTE — ED Triage Notes (Signed)
Here for continued pain from blisters on feet.  Also c/o migraine headache.  States they gave me tylenol but I need something stronger.

## 2017-01-30 NOTE — ED Notes (Signed)
Requested security to check on this person prior to PT checking in. PT had been lying down to the lobby for some hours sleep. When speaking to the PT he stated to me that he was having chest pains, but chief complaint stating he had feet problems. Spoke to Triage RN about this as well.

## 2017-01-30 NOTE — ED Provider Notes (Signed)
[facility] Provider Note   CSN: [id number] Arrival date & time: 01/30/17  0004     History   Chief Complaint Chief Complaint  Patient presents with  . Foot Pain  . Headache    HPI Dustin Norris is a 41 y.o. male.  Patient presents to the ED with a chief complaint of headache.  He states that he has had a headache since yesterday.  He reports a history of migraines.  He reports associate photophobia and phonophobia.  States that he took some Tylenol yesterday with some relief.  He denies fevers, chills, neck stiffness, weakness, numbness, tingling, slurred speech or vision changes.  There are no other associated symptoms.   The history is provided by the patient. No language interpreter was used.    Past Medical History:  Diagnosis Date  . Anxiety   . Asthma   . Cocaine abuse (HCC)   . Homelessness   . Migraine   . Tobacco abuse     Patient Active Problem List   Diagnosis Date Noted  . 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 Cocaine use disorder, moderate, dependence (HCC) 07/26/2016  . 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 Psychosis (HCC) 07/24/2016  . Chest pain 11/23/2015  . Asthma 11/23/2015  . Tobacco abuse 11/23/2015  . Elevated lactic acid level 11/23/2015  . Cocaine abuse (HCC)   . Concern about STD in male without diagnosis   . Paranoid ideation (HCC) 02/22/2015    History reviewed. No pertinent surgical history.     Home Medications    Prior to Admission medications   Medication Sig Start Date End Date Taking? Authorizing Provider  acetaminophen (TYLENOL) 500 MG tablet Take 500 mg by mouth every 6 (six) hours as needed for mild pain or headache.    [provider]  albuterol (PROVENTIL HFA;VENTOLIN HFA) 108 (90 Base) MCG/ACT inhaler Inhale 2 puffs into the lungs every 4 (four) hours as needed for wheezing or shortness of breath.  07/31/16   Muthersbaugh, Dahlia ClientHannah, PA-C  terbinafine (LAMISIL AT) 1 % cream Apply 1 application topically 2 (two) times daily. 01/29/17   Fayrene Helperran, Bowie, PA-C    Family History No family history on file.  Social History Social History   Tobacco Use  . Smoking status: Current Every Day Smoker    Packs/day: 1.00    Years: 0.00    Pack years: 0.00    Types: Cigarettes, Cigars  . Smokeless tobacco: Never Used  Substance Use Topics  . Alcohol use: Yes  . Drug use: No    Comment: last use October 2017     Allergies   Haldol [haloperidol lactate]   Review of Systems Review of Systems  All other systems reviewed and are negative.    Physical Exam Updated Vital Signs BP (!) 146/99 (BP Location: Right Arm)   Pulse 76   Temp 98.3 F (36.8 C) (Oral)   Resp 16   Ht 5\' 7"  (1.702 m)   Wt 73 kg (161 lb)   SpO2 100%   BMI 25.22 kg/m   Physical Exam  Constitutional: He is oriented to person, place, and time. He appears well-developed and well-nourished.  HENT:  Head: Normocephalic and atraumatic.  Right Ear: External ear normal.  Left Ear: External ear normal.  Eyes: Conjunctivae and EOM are normal. Pupils are equal, round, and reactive to light.  Neck: Normal range of motion. Neck supple.  No pain with neck  flexion, no meningismus  Cardiovascular: Normal rate, regular rhythm and normal heart sounds. Exam reveals no gallop and no friction rub.  No murmur heard. Pulmonary/Chest: Effort normal and breath sounds normal. No respiratory distress. He has no wheezes. He has no rales. He exhibits no tenderness.  Abdominal: Soft. He exhibits no distension and no mass. There is no tenderness. There is no rebound and no guarding.  Musculoskeletal: Normal range of motion. He exhibits no edema or tenderness.  Normal gait.  Neurological: He is alert and oriented to person, place, and time. He has normal reflexes.  CN 3-12 intact, normal finger to nose, no pronator drift, sensation and  strength intact bilaterally.  Skin: Skin is warm and dry.  Psychiatric: He has a normal mood and affect. His behavior is normal. Judgment and thought content normal.  Nursing note and vitals reviewed.    ED Treatments / Results  Labs (all labs ordered are listed, but only abnormal results are displayed) Labs Reviewed - No data to display  EKG  EKG Interpretation None       Radiology No results found.  Procedures Procedures (including critical care time)  Medications Ordered in ED Medications  aspirin-acetaminophen-caffeine (EXCEDRIN MIGRAINE) per tablet 1 tablet (not administered)     Initial Impression / Assessment and Plan / ED Course  I have reviewed the triage vital signs and the nursing notes.  Pertinent labs & imaging results that were available during my care of the patient were reviewed by me and considered in my medical decision making (see chart for details).    Pt HA treated and improved while in ED.  Presentation is like pts typical HA and non concerning for [facility] LPAH, ICH, Meningitis, or temporal arteritis. Pt is afebrile with no focal neuro deficits, nuchal rigidity, or change in vision. Pt is to follow up with PCP to discuss prophylactic medication. Pt verbalizes understanding and is agreeable with plan to dc.    Final Clinical Impressions(s) / ED Diagnoses   Final diagnoses:  Acute nonintractable headache, unspecified headache type    ED Discharge Orders    None       Roxy HorsemanBrowning, Dezaria Methot, PA-C 01/30/17 [id number]    Geoffery Lyonselo, Douglas, MD 01/30/17 [phone]

## 2017-01-30 NOTE — ED Notes (Signed)
This tech went back into room after getting pt EKG to get pt vital signs pt was concerned about lottery tick and made the comment to this tech " man it would be nice if I would have won the lottery I would sue all of yall at the hospital I would lie and have you thrown in jail, no as a matter of fact I would have you all killed."

## 2017-02-01 ENCOUNTER — Encounter (HOSPITAL_COMMUNITY): Payer: Self-pay

## 2017-02-01 DIAGNOSIS — J069 Acute upper respiratory infection, unspecified: Secondary | ICD-10-CM | POA: Insufficient documentation

## 2017-02-01 DIAGNOSIS — F17210 Nicotine dependence, cigarettes, uncomplicated: Secondary | ICD-10-CM | POA: Insufficient documentation

## 2017-02-01 DIAGNOSIS — Z79899 Other long term (current) drug therapy: Secondary | ICD-10-CM | POA: Insufficient documentation

## 2017-02-01 DIAGNOSIS — J45909 Unspecified asthma, uncomplicated: Secondary | ICD-10-CM | POA: Insufficient documentation

## 2017-02-01 NOTE — ED Notes (Signed)
Pt complains of URI sx for two days

## 2017-02-02 ENCOUNTER — Emergency Department (HOSPITAL_COMMUNITY)
Admission: EM | Admit: 2017-02-02 | Discharge: 2017-02-02 | Disposition: A | Discharge status: Home / Self Care | Payer: Self-pay | Attending: Emergency Medicine | Admitting: Emergency Medicine

## 2017-02-02 ENCOUNTER — Encounter (HOSPITAL_COMMUNITY): Payer: Self-pay | Admitting: Emergency Medicine

## 2017-02-02 ENCOUNTER — Emergency Department (HOSPITAL_COMMUNITY): Payer: Self-pay

## 2017-02-02 ENCOUNTER — Emergency Department (HOSPITAL_COMMUNITY)
Admission: EM | Admit: 2017-02-02 | Discharge: 2017-02-03 | Disposition: A | Discharge status: Home / Self Care | Payer: Self-pay | Attending: Emergency Medicine | Admitting: Emergency Medicine

## 2017-02-02 DIAGNOSIS — B9789 Other viral agents as the cause of diseases classified elsewhere: Secondary | ICD-10-CM

## 2017-02-02 DIAGNOSIS — R0981 Nasal congestion: Secondary | ICD-10-CM | POA: Insufficient documentation

## 2017-02-02 DIAGNOSIS — Z79899 Other long term (current) drug therapy: Secondary | ICD-10-CM | POA: Insufficient documentation

## 2017-02-02 DIAGNOSIS — J069 Acute upper respiratory infection, unspecified: Secondary | ICD-10-CM

## 2017-02-02 DIAGNOSIS — F17210 Nicotine dependence, cigarettes, uncomplicated: Secondary | ICD-10-CM | POA: Insufficient documentation

## 2017-02-02 DIAGNOSIS — J029 Acute pharyngitis, unspecified: Secondary | ICD-10-CM | POA: Insufficient documentation

## 2017-02-02 DIAGNOSIS — R05 Cough: Secondary | ICD-10-CM | POA: Insufficient documentation

## 2017-02-02 DIAGNOSIS — Z590 Homelessness: Secondary | ICD-10-CM | POA: Insufficient documentation

## 2017-02-02 DIAGNOSIS — R062 Wheezing: Secondary | ICD-10-CM

## 2017-02-02 DIAGNOSIS — J45909 Unspecified asthma, uncomplicated: Secondary | ICD-10-CM | POA: Insufficient documentation

## 2017-02-02 MED ORDER — IPRATROPIUM-ALBUTEROL 0.5-2.5 (3) MG/3ML IN SOLN
3.0000 mL | Freq: Once | RESPIRATORY_TRACT | Status: AC
Start: 2017-02-02 — End: 2017-02-02
  Administered 2017-02-02: 3 mL via RESPIRATORY_TRACT
  Filled 2017-02-02: qty 3

## 2017-02-02 MED ORDER — IBUPROFEN 800 MG PO TABS: 800.0000 mg | ORAL_TABLET | Freq: Three times a day (TID) | ORAL | 0 refills | Status: DC | PRN

## 2017-02-02 MED ORDER — GUAIFENESIN ER 1200 MG PO TB12: 1.0000 | ORAL_TABLET | Freq: Two times a day (BID) | ORAL | 0 refills | Status: DC

## 2017-02-02 MED ORDER — PROMETHAZINE-DM 6.25-15 MG/5ML PO SYRP: 10.0000 mL | ORAL_SOLUTION | Freq: Four times a day (QID) | ORAL | 0 refills | Status: DC | PRN

## 2017-02-02 NOTE — Discharge Instructions (Signed)
Return here as needed.  Follow-up with a primary doctor.  Increase your fluid intake and rest as much as possible. °

## 2017-02-02 NOTE — ED Triage Notes (Signed)
Patient c/o cough and congestion x 1 week.

## 2017-02-02 NOTE — ED Provider Notes (Signed)
[facility]-EMERGENCY DEPT Provider Note   CSN: [id number] Arrival date & time: 02/01/17  2320     History   Chief Complaint Chief Complaint  Patient presents with  . URI    HPI Dustin Norris is a 42 y.o. male.  HPI  Patient presents to the emergency department with cough, nasal congestion, and sore throat over the last 2 days.  The patient states that nothing seems to make the condition better or worse.  Patient states he is out of his inhalers at home.  Patient states that he did not take any other medications prior to arrival.  The patient denies chest pain, shortness of breath, headache,blurred vision, neck pain, fever,weakness, numbness, dizziness, anorexia, edema, abdominal pain, nausea, vomiting, diarrhea, rash, back pain, dysuria, hematemesis, bloody stool, near syncope, or syncope. Past Medical History:  Diagnosis Date  . Anxiety   . Asthma   . Cocaine abuse (HCC)   . Homelessness   . Migraine   . Tobacco abuse     Patient Active Problem List   Diagnosis Date Noted  . 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 Cocaine use disorder, moderate, dependence (HCC) 07/26/2016  . 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 Psychosis (HCC) 07/24/2016  . Chest pain 11/23/2015  . Asthma 11/23/2015  . Tobacco abuse 11/23/2015  . Elevated lactic acid level 11/23/2015  . Cocaine abuse (HCC)   . Concern about STD in male without diagnosis   . Paranoid ideation (HCC) 02/22/2015    History reviewed. No pertinent surgical history.     Home Medications    Prior to Admission medications   Medication Sig Start Date End Date Taking? Authorizing Provider  acetaminophen (TYLENOL) 500 MG tablet Take 500 mg by mouth every 6 (six) hours as needed for mild pain or headache.   Yes [provider]  albuterol (PROVENTIL HFA;VENTOLIN HFA) 108 (90 Base) MCG/ACT inhaler Inhale 2 puffs into the lungs  every 4 (four) hours as needed for wheezing or shortness of breath. Patient not taking: Reported on 02/02/2017 07/31/16   Muthersbaugh, Dahlia Client, PA-C  terbinafine (LAMISIL AT) 1 % cream Apply 1 application topically 2 (two) times daily. Patient not taking: Reported on 02/02/2017 01/29/17   Fayrene Helper, PA-C    Family History History reviewed. No pertinent family history.  Social History Social History   Tobacco Use  . Smoking status: Current Every Day Smoker    Packs/day: 1.00    Years: 0.00    Pack years: 0.00    Types: Cigarettes, Cigars  . Smokeless tobacco: Never Used  Substance Use Topics  . Alcohol use: Yes  . Drug use: No    Comment: last use October 2017     Allergies   Haldol [haloperidol lactate]   Review of Systems Review of Systems All other systems negative except as documented in the HPI. All pertinent positives and negatives as reviewed in the HPI. Physical Exam Updated Vital Signs BP (!) 145/78 (BP Location: Right Arm)   Pulse 96   Temp 99.3 F (37.4 C) (Oral)   Resp 18   Ht 5\' 7"  (1.702 m)   Wt 73 kg (161 lb)   SpO2 98%   BMI 25.22 kg/m    Physical Exam  Constitutional: He is oriented to person, place, and time. He appears well-developed and well-nourished. No distress.  HENT:  Head: Normocephalic and atraumatic.  Mouth/Throat: Oropharynx is clear and moist.  Eyes: Pupils are equal, round,  and reactive to light.  Neck: Normal range of motion. Neck supple.  Cardiovascular: Normal rate, regular rhythm and normal heart sounds. Exam reveals no gallop and no friction rub.  No murmur heard. Pulmonary/Chest: Effort normal and breath sounds normal. No respiratory distress. He has no wheezes.  Abdominal: Soft. Bowel sounds are normal. He exhibits no distension. There is no tenderness.  Neurological: He is alert and oriented to person, place, and time. He exhibits normal muscle tone. Coordination normal.  Skin: Skin is warm and dry. Capillary refill takes  less than 2 seconds. No rash noted. No erythema.  Psychiatric: He has a normal mood and affect. His behavior is normal.  Nursing note and vitals reviewed.    ED Treatments / Results  Labs (all labs ordered are listed, but only abnormal results are displayed) Labs Reviewed - No data to display  EKG  EKG Interpretation None       Radiology Dg Chest 2 View  Result Date: 02/02/2017 CLINICAL DATA:  Acute onset of cough. EXAM: CHEST  2 VIEW COMPARISON:  Chest radiograph performed 01/27/2017 FINDINGS: The lungs are well-aerated and clear. There is no evidence of focal opacification, pleural effusion or pneumothorax. The heart is normal in size; the mediastinal contour is within normal limits. No acute osseous abnormalities are seen. IMPRESSION: No acute cardiopulmonary process seen. Electronically Signed   By: Roanna RaiderJeffery  Chang M.D.   On: 02/02/2017 04:11    Procedures Procedures (including critical care time)  Medications Ordered in ED Medications  ipratropium-albuterol (DUONEB) 0.5-2.5 (3) MG/3ML nebulizer solution 3 mL (3 mLs Nebulization Given 02/02/17 0426)     Initial Impression / Assessment and Plan / ED Course  I have reviewed the triage vital signs and the nursing notes.  Pertinent labs & imaging results that were available during my care of the patient were reviewed by me and considered in my medical decision making (see chart for details).     Patient will be treated for viral URI with cough.  The patient is advised to return here as needed he is also advised to increase his fluid intake and rest as much as possible patient is also advised to follow-up with a primary care doctor.  The patient does not have any hypoxia or hypotension noted here in the emergency department.  Final Clinical Impressions(s) / ED Diagnoses   Final diagnoses:  None    ED Discharge Orders    None      Charlestine NightLawyer, Kazi Reppond, PA-C 02/02/17 0631  Molpus, Jonny RuizJohn, MD 02/02/17 [phone]

## 2017-02-03 ENCOUNTER — Other Ambulatory Visit: Payer: Self-pay

## 2017-02-03 ENCOUNTER — Emergency Department (HOSPITAL_COMMUNITY)
Admission: EM | Admit: 2017-02-03 | Discharge: 2017-02-03 | Disposition: A | Discharge status: Home / Self Care | Payer: Self-pay | Attending: Emergency Medicine | Admitting: Emergency Medicine

## 2017-02-03 ENCOUNTER — Encounter (HOSPITAL_COMMUNITY): Payer: Self-pay | Admitting: *Deleted

## 2017-02-03 DIAGNOSIS — R509 Fever, unspecified: Secondary | ICD-10-CM | POA: Insufficient documentation

## 2017-02-03 DIAGNOSIS — Z5321 Procedure and treatment not carried out due to patient leaving prior to being seen by health care provider: Secondary | ICD-10-CM | POA: Insufficient documentation

## 2017-02-03 DIAGNOSIS — J069 Acute upper respiratory infection, unspecified: Secondary | ICD-10-CM | POA: Insufficient documentation

## 2017-02-03 DIAGNOSIS — Z79899 Other long term (current) drug therapy: Secondary | ICD-10-CM | POA: Insufficient documentation

## 2017-02-03 DIAGNOSIS — R51 Headache: Secondary | ICD-10-CM | POA: Insufficient documentation

## 2017-02-03 DIAGNOSIS — Z0489 Encounter for examination and observation for other specified reasons: Secondary | ICD-10-CM | POA: Insufficient documentation

## 2017-02-03 DIAGNOSIS — J45909 Unspecified asthma, uncomplicated: Secondary | ICD-10-CM | POA: Insufficient documentation

## 2017-02-03 DIAGNOSIS — J3489 Other specified disorders of nose and nasal sinuses: Secondary | ICD-10-CM | POA: Insufficient documentation

## 2017-02-03 DIAGNOSIS — F17210 Nicotine dependence, cigarettes, uncomplicated: Secondary | ICD-10-CM | POA: Insufficient documentation

## 2017-02-03 DIAGNOSIS — R0981 Nasal congestion: Secondary | ICD-10-CM | POA: Insufficient documentation

## 2017-02-03 DIAGNOSIS — B9789 Other viral agents as the cause of diseases classified elsewhere: Secondary | ICD-10-CM | POA: Insufficient documentation

## 2017-02-03 DIAGNOSIS — Z590 Homelessness: Secondary | ICD-10-CM | POA: Insufficient documentation

## 2017-02-03 DIAGNOSIS — R000 Tachycardia, unspecified: Secondary | ICD-10-CM | POA: Insufficient documentation

## 2017-02-03 MED ORDER — FLUTICASONE PROPIONATE 50 MCG/ACT NA SUSP: 2.0000 | Freq: Every day | NASAL | 2 refills | Status: DC

## 2017-02-03 MED ORDER — ALBUTEROL SULFATE HFA 108 (90 BASE) MCG/ACT IN AERS
2.0000 | INHALATION_SPRAY | RESPIRATORY_TRACT | Status: DC | PRN
  Administered 2017-02-03: 2 via RESPIRATORY_TRACT
  Filled 2017-02-03: qty 6.7

## 2017-02-03 MED ORDER — ALBUTEROL SULFATE HFA 108 (90 BASE) MCG/ACT IN AERS
2.0000 | INHALATION_SPRAY | RESPIRATORY_TRACT | 3 refills | Status: DC | PRN
Start: 2017-02-03 — End: 2017-03-20

## 2017-02-03 MED ORDER — PREDNISONE 20 MG PO TABS
40.0000 mg | ORAL_TABLET | Freq: Once | ORAL | Status: AC
  Administered 2017-02-03: 40 mg via ORAL
  Filled 2017-02-03: qty 2

## 2017-02-03 MED ORDER — GUAIFENESIN-CODEINE 100-10 MG/5ML PO SOLN: 5.0000 mL | Freq: Three times a day (TID) | ORAL | 0 refills | Status: DC | PRN

## 2017-02-03 MED ORDER — AEROCHAMBER PLUS FLO-VU MEDIUM MISC
1.0000 | Freq: Once | Status: AC
  Administered 2017-02-03: 1
  Filled 2017-02-03: qty 1

## 2017-02-03 MED ORDER — PREDNISONE 20 MG PO TABS: 40.0000 mg | ORAL_TABLET | Freq: Every day | ORAL | 0 refills | Status: DC

## 2017-02-03 MED ORDER — PREDNISONE 10 MG PO TABS: 20.0000 mg | ORAL_TABLET | Freq: Every day | ORAL | 0 refills | Status: DC

## 2017-02-03 MED ORDER — ALBUTEROL SULFATE (2.5 MG/3ML) 0.083% IN NEBU: 5.0000 mg | INHALATION_SOLUTION | Freq: Once | RESPIRATORY_TRACT | Status: DC

## 2017-02-03 MED ORDER — HYDROCOD POLST-CPM POLST ER 10-8 MG/5ML PO SUER
5.0000 mL | Freq: Once | ORAL | Status: AC
  Administered 2017-02-03: 5 mL via ORAL
  Filled 2017-02-03: qty 5

## 2017-02-03 MED ORDER — PREDNISONE 20 MG PO TABS
60.0000 mg | ORAL_TABLET | Freq: Once | ORAL | Status: AC
  Administered 2017-02-03: 60 mg via ORAL
  Filled 2017-02-03: qty 3

## 2017-02-03 NOTE — ED Provider Notes (Signed)
[facility]-EMERGENCY DEPT Provider Note   CSN: [id number] Arrival date & time: 02/02/17  2048     History   Chief Complaint Chief Complaint  Patient presents with  . Cough  . Nasal Congestion    HPI Dustin Norris is a 42 y.o. male with a hx of anxiety, asthma, cocaine abuse, homelessness, tobacco abuse presents to the Emergency Department complaining of gradual, persistent, progressively worsening URI symptoms including cough, nasal congestion, posterior nasal drip, sore throat onset 2 days ago.  Patient reports that he had a similar cold over Christmas however that improved.  No treatments prior to arrival.  No known aggravating or alleviating factors.  Patient denies fevers or chills, chest pain, shortness of breath, abdominal pain, nausea, vomiting, diarrhea.  The history is provided by the patient and medical records. No language interpreter was used.    Past Medical History:  Diagnosis Date  . Anxiety   . Asthma   . Cocaine abuse (HCC)   . Homelessness   . Migraine   . Tobacco abuse     Patient Active Problem List   Diagnosis Date Noted  . 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 Cocaine use disorder, moderate, dependence (HCC) 07/26/2016  . 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 Psychosis (HCC) 07/24/2016  . Chest pain 11/23/2015  . Asthma 11/23/2015  . Tobacco abuse 11/23/2015  . Elevated lactic acid level 11/23/2015  . Cocaine abuse (HCC)   . Concern about STD in male without diagnosis   . Paranoid ideation (HCC) 02/22/2015    History reviewed. No pertinent surgical history.     Home Medications    Prior to Admission medications   Medication Sig Start Date End Date Taking? Authorizing Provider  acetaminophen (TYLENOL) 500 MG tablet Take 500 mg by mouth every 6 (six) hours as needed for mild pain or headache.    [provider]  albuterol (PROVENTIL  HFA;VENTOLIN HFA) 108 (90 Base) MCG/ACT inhaler Inhale 2 puffs into the lungs every 4 (four) hours as needed for wheezing or shortness of breath. 02/03/17   Yahmir Sokolov, Dahlia Client, PA-C  fluticasone (FLONASE) 50 MCG/ACT nasal spray Place 2 sprays into both nostrils daily. 02/03/17   Dallas Scorsone, Dahlia Client, PA-C  Guaifenesin 1200 MG TB12 Take 1 tablet (1,200 mg total) by mouth 2 (two) times daily. 02/02/17   Lawyer, Cristal Deer, PA-C  ibuprofen (ADVIL,MOTRIN) 800 MG tablet Take 1 tablet (800 mg total) by mouth every 8 (eight) hours as needed. 02/02/17   Lawyer, Cristal Deer, PA-C  predniSONE (DELTASONE) 20 MG tablet Take 2 tablets (40 mg total) by mouth daily. 02/03/17   Leib Elahi, Dahlia Client, PA-C  promethazine-dextromethorphan (PROMETHAZINE-DM) 6.25-15 MG/5ML syrup Take 10 mLs by mouth 4 (four) times daily as needed for cough. 02/02/17   Lawyer, Cristal Deer, PA-C  terbinafine (LAMISIL AT) 1 % cream Apply 1 application topically 2 (two) times daily. Patient not taking: Reported on 02/02/2017 01/29/17   Fayrene Helper, PA-C    Family History No family history on file.  Social History Social History   Tobacco Use  . Smoking status: Current Every Day Smoker    Packs/day: 1.00    Years: 0.00    Pack years: 0.00    Types: Cigarettes, Cigars  . Smokeless tobacco: Never Used  Substance Use Topics  . Alcohol use: Yes  . Drug use: No    Comment: last use October 2017     Allergies   Haldol [haloperidol lactate]   Review of  Systems Review of Systems  Constitutional: Positive for fatigue. Negative for appetite change, chills and fever.  HENT: Positive for congestion, postnasal drip, rhinorrhea, sinus pressure and sore throat. Negative for ear discharge, ear pain and mouth sores.   Eyes: Negative for visual disturbance.  Respiratory: Positive for cough, chest tightness and wheezing. Negative for shortness of breath and stridor.   Cardiovascular: Negative for chest pain, palpitations and leg swelling.    Gastrointestinal: Negative for abdominal pain, diarrhea, nausea and vomiting.  Genitourinary: Negative for dysuria, frequency, hematuria and urgency.  Musculoskeletal: Negative for arthralgias, back pain, myalgias and neck stiffness.  Skin: Negative for rash.  Neurological: Negative for syncope, light-headedness, numbness and headaches.  Hematological: Negative for adenopathy.  Psychiatric/Behavioral: The patient is not nervous/anxious.   All other systems reviewed and are negative.    Physical Exam Updated Vital Signs BP 118/68 (BP Location: Right Arm)   Pulse 78   Temp 99 F (37.2 C) (Oral)   Resp 16   SpO2 98%   Physical Exam  Constitutional: He appears well-developed and well-nourished. No distress.  HENT:  Head: Normocephalic and atraumatic.  Right Ear: Tympanic membrane, external ear and ear canal normal.  Left Ear: Tympanic membrane, external ear and ear canal normal.  Nose: Mucosal edema and rhinorrhea present. No epistaxis. Right sinus exhibits no maxillary sinus tenderness and no frontal sinus tenderness. Left sinus exhibits no maxillary sinus tenderness and no frontal sinus tenderness.  Mouth/Throat: Uvula is midline and mucous membranes are normal. Mucous membranes are not pale and not cyanotic. No oropharyngeal exudate, posterior oropharyngeal edema, posterior oropharyngeal erythema or tonsillar abscesses.  Eyes: Conjunctivae are normal. Pupils are equal, round, and reactive to light.  Neck: Normal range of motion and full passive range of motion without pain.  Cardiovascular: Normal rate and intact distal pulses.  Pulmonary/Chest: Effort normal. No stridor. He has wheezes.  Inspiratory and expiratory wheezes throughout.  No focal breath sounds.  Abdominal: Soft. There is no tenderness.  Musculoskeletal: Normal range of motion.  Lymphadenopathy:    He has no cervical adenopathy.  Neurological: He is alert.  Skin: Skin is warm and dry. No rash noted. He is not  diaphoretic.  Psychiatric: He has a normal mood and affect.  Nursing note and vitals reviewed.    ED Treatments / Results  Labs (all labs ordered are listed, but only abnormal results are displayed) Labs Reviewed - No data to display   Radiology Dg Chest 2 View  Result Date: 02/03/2017 CLINICAL DATA:  Cough, congestion, and chest tightness for 48 hours. EXAM: CHEST  2 VIEW COMPARISON:  02/02/2017 FINDINGS: Shallow inspiration. Normal heart size and pulmonary vascularity. No focal airspace disease or consolidation in the lungs. No blunting of costophrenic angles. No pneumothorax. Mediastinal contours appear intact. IMPRESSION: No active cardiopulmonary disease. Electronically Signed   By: Burman Nieves M.D.   On: 02/03/2017 00:04   Procedures Procedures (including critical care time)  Medications Ordered in ED Medications  albuterol (PROVENTIL HFA;VENTOLIN HFA) 108 (90 Base) MCG/ACT inhaler 2 puff (2 puffs Inhalation Given 02/03/17 0258)  AEROCHAMBER PLUS FLO-VU MEDIUM MISC 1 each (1 each Other Given 02/03/17 0225)  predniSONE (DELTASONE) tablet 60 mg (60 mg Oral Given 02/03/17 0224)     Initial Impression / Assessment and Plan / ED Course  I have reviewed the triage vital signs and the nursing notes.  Pertinent labs & imaging results that were available during my care of the patient were reviewed by me and considered  in my medical decision making (see chart for details).  Clinical Course as of Feb 03 337  Thu Feb 03, 2017  0241 Breath sounds improved after MDI.  No hypoxia, fever, respiratory distress.  [HM]    Clinical Course User Index [HM] Jenalee Trevizo, Dahlia ClientHannah, PA-C    Pt CXR negative for acute infiltrate. Patients symptoms are consistent with URI, likely viral etiology. Discussed that antibiotics are not indicated for viral infections. Pt will be discharged with symptomatic treatment including albuterol and prednisone for minor asthma exacerbation secondary to URI.   Verbalizes understanding and is agreeable with plan. Pt is hemodynamically stable & in NAD prior to dc.   Final Clinical Impressions(s) / ED Diagnoses   Final diagnoses:  Viral upper respiratory tract infection  Wheezing    ED Discharge Orders        Ordered    fluticasone (FLONASE) 50 MCG/ACT nasal spray  Daily     02/03/17 0137    albuterol (PROVENTIL HFA;VENTOLIN HFA) 108 (90 Base) MCG/ACT inhaler  Every 4 hours PRN     02/03/17 0137    predniSONE (DELTASONE) 20 MG tablet  Daily     02/03/17 0137       Marveline Profeta, Dahlia ClientHannah, PA-C 02/03/17 [id number]    Zadie RhineWickline, Donald, MD 02/03/17 [phone]

## 2017-02-03 NOTE — ED Notes (Signed)
Upon entering to discharge pt he was not in room. Staff reported he walked out of facility

## 2017-02-03 NOTE — ED Triage Notes (Signed)
Pt states while he is here, he needs to get his feet checked out, pt was just here at 1900 and left prior to receiving discharge instructions and prescriptions

## 2017-02-03 NOTE — ED Triage Notes (Signed)
Returns to get more medicine for his cold, was seen and treated last night.

## 2017-02-03 NOTE — ED Provider Notes (Signed)
[facility]-EMERGENCY DEPT Provider Note   CSN: [id number] Arrival date & time: 02/03/17  [id number]     History   Chief Complaint Chief Complaint  Patient presents with  . URI    HPI Dustin Norris is a 42 y.o. male who presents to the ED with cough and congestion. Patient reports that he was here last night and treated with medication and given Rx for prednisone and he lost his Rx. Patient here requesting a dose of medication and new Rx. Patient also request a cough medicine.   HPI  Past Medical History:  Diagnosis Date  . Anxiety   . Asthma   . Cocaine abuse (HCC)   . Homelessness   . Migraine   . Tobacco abuse     Patient Active Problem List   Diagnosis Date Noted  . 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 Cocaine use disorder, moderate, dependence (HCC) 07/26/2016  . 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 Psychosis (HCC) 07/24/2016  . Chest pain 11/23/2015  . Asthma 11/23/2015  . Tobacco abuse 11/23/2015  . Elevated lactic acid level 11/23/2015  . Cocaine abuse (HCC)   . Concern about STD in male without diagnosis   . Paranoid ideation (HCC) 02/22/2015    History reviewed. No pertinent surgical history.     Home Medications    Prior to Admission medications   Medication Sig Start Date End Date Taking? Authorizing Provider  acetaminophen (TYLENOL) 500 MG tablet Take 500 mg by mouth every 6 (six) hours as needed for mild pain or headache.    [provider]  albuterol (PROVENTIL HFA;VENTOLIN HFA) 108 (90 Base) MCG/ACT inhaler Inhale 2 puffs into the lungs every 4 (four) hours as needed for wheezing or shortness of breath. 02/03/17   Muthersbaugh, Dahlia ClientHannah, PA-C  fluticasone (FLONASE) 50 MCG/ACT nasal spray Place 2 sprays into both nostrils daily. 02/03/17   Muthersbaugh, Dahlia ClientHannah, PA-C  guaiFENesin-codeine 100-10 MG/5ML syrup Take 5 mLs by mouth 3 (three) times daily as needed  for cough. 02/03/17   Janne NapoleonNeese, Qais Jowers M, NP  ibuprofen (ADVIL,MOTRIN) 800 MG tablet Take 1 tablet (800 mg total) by mouth every 8 (eight) hours as needed. 02/02/17   Lawyer, Cristal Deerhristopher, PA-C  predniSONE (DELTASONE) 10 MG tablet Take 2 tablets (20 mg total) by mouth daily. 02/03/17   Janne NapoleonNeese, Abimelec Grochowski M, NP  promethazine-dextromethorphan (PROMETHAZINE-DM) 6.25-15 MG/5ML syrup Take 10 mLs by mouth 4 (four) times daily as needed for cough. 02/02/17   Lawyer, Cristal Deerhristopher, PA-C  terbinafine (LAMISIL AT) 1 % cream Apply 1 application topically 2 (two) times daily. Patient not taking: Reported on 02/02/2017 01/29/17   Fayrene Helperran, Bowie, PA-C    Family History No family history on file.  Social History Social History   Tobacco Use  . Smoking status: Current Every Day Smoker    Packs/day: 1.00    Years: 0.00    Pack years: 0.00    Types: Cigarettes, Cigars  . Smokeless tobacco: Never Used  Substance Use Topics  . Alcohol use: Yes  . Drug use: No    Comment: last use October 2017     Allergies   Haldol [haloperidol lactate]   Review of Systems Review of Systems  Constitutional: Positive for chills and fever.  HENT: Positive for congestion, sinus pain and sore throat.   Eyes: Negative for visual disturbance.  Respiratory: Positive for cough, shortness of breath and wheezing.   Cardiovascular: Negative for chest pain.  Gastrointestinal: Negative for  abdominal pain, nausea and vomiting.  Musculoskeletal: Positive for myalgias.  Skin: Negative for rash.  Neurological: Positive for headaches.  Psychiatric/Behavioral: Negative for confusion.     Physical Exam Updated Vital Signs BP 137/90   Pulse (!) 102 Comment: moving around and coughing   Temp 99 F (37.2 C) (Oral)   Resp 20   Ht 5\' 7"  (1.702 m)   Wt 73 kg (161 lb)   SpO2 100%   BMI 25.22 kg/m   Physical Exam  Constitutional: He is oriented to person, place, and time. He appears well-developed and well-nourished. No distress.  HENT:  Head:  Normocephalic.  Right Ear: Tympanic membrane normal.  Left Ear: Tympanic membrane normal.  Nose: Mucosal edema and rhinorrhea present.  Mouth/Throat: Uvula is midline and oropharynx is clear and moist.  Eyes: Conjunctivae and EOM are normal. Pupils are equal, round, and reactive to light.  Neck: Normal range of motion. Neck supple.  Cardiovascular: Regular rhythm and normal pulses. Tachycardia present.  Pulmonary/Chest: Effort normal. Wheezes: occasional.  Musculoskeletal: Normal range of motion.  Neurological: He is alert and oriented to person, place, and time.  Skin: Skin is warm and dry.  Psychiatric: He has a normal mood and affect.  Nursing note and vitals reviewed.  Chest x-ray done earlier showed no pneumonia.   ED Treatments / Results  Labs (all labs ordered are listed, but only abnormal results are displayed) Labs Reviewed - No data to display  [facility]  Procedures Procedures (including critical care time)  Medications Ordered in ED Medications  predniSONE (DELTASONE) tablet 40 mg (40 mg Oral Given 02/03/17 1958)  chlorpheniramine-HYDROcodone (TUSSIONEX) 10-8 MG/5ML suspension 5 mL (5 mLs Oral Given 02/03/17 1958)     Initial Impression / Assessment and Plan / ED Course  I have reviewed the triage vital signs and the nursing notes. Pt CXR from earlier today negative for acute infiltrate. Patients symptoms are consistent with URI, likely viral etiology. Discussed that antibiotics are not indicated for viral infections. Pt will be discharged with symptomatic treatment. Patient Rx for prednisone written since he states he lost the one from earlier.  Pt is hemodynamically stable & in NAD prior to dc.  Final Clinical Impressions(s) / ED Diagnoses   Final diagnoses:  Viral URI with cough    ED Discharge Orders        Ordered    predniSONE (DELTASONE) 10 MG tablet  Daily     02/03/17 2017    guaiFENesin-codeine 100-10 MG/5ML syrup  3 times daily PRN     02/03/17  2017       Kerrie Buffalo [place], [place] 02/03/17 2037

## 2017-02-03 NOTE — Discharge Instructions (Signed)
1. Medications: flonase, mucinex, albuterol, prednisone, usual home medications 2. Treatment: rest, drink plenty of fluids, take tylenol or ibuprofen for fever control 3. Follow Up: Please followup with your primary doctor in 3 days for discussion of your diagnoses and further evaluation after today's visit; if you do not have a primary care doctor use the resource guide provided to find one; Return to the ER for high fevers, difficulty breathing or other concerning symptoms

## 2017-02-04 ENCOUNTER — Emergency Department (HOSPITAL_COMMUNITY)
Admission: EM | Admit: 2017-02-04 | Discharge: 2017-02-04 | Disposition: A | Discharge status: Home / Self Care | Payer: Self-pay | Attending: Emergency Medicine | Admitting: Emergency Medicine

## 2017-02-04 ENCOUNTER — Encounter (HOSPITAL_COMMUNITY): Payer: Self-pay | Admitting: Nurse Practitioner

## 2017-02-04 DIAGNOSIS — J45901 Unspecified asthma with (acute) exacerbation: Secondary | ICD-10-CM | POA: Insufficient documentation

## 2017-02-04 DIAGNOSIS — Z590 Homelessness: Secondary | ICD-10-CM | POA: Insufficient documentation

## 2017-02-04 DIAGNOSIS — Z9114 Patient's other noncompliance with medication regimen: Secondary | ICD-10-CM | POA: Insufficient documentation

## 2017-02-04 DIAGNOSIS — F17210 Nicotine dependence, cigarettes, uncomplicated: Secondary | ICD-10-CM | POA: Insufficient documentation

## 2017-02-04 DIAGNOSIS — Z79899 Other long term (current) drug therapy: Secondary | ICD-10-CM | POA: Insufficient documentation

## 2017-02-04 NOTE — ED Notes (Signed)
I called patient name to recheck vitals and no one responded 

## 2017-02-04 NOTE — ED Notes (Signed)
Pt asleep in the lobby

## 2017-02-04 NOTE — ED Triage Notes (Signed)
Pt states he would like "the cough syrup he got when he was seen yesterday."

## 2017-02-05 ENCOUNTER — Emergency Department (HOSPITAL_COMMUNITY)
Admission: EM | Admit: 2017-02-05 | Discharge: 2017-02-05 | Disposition: A | Discharge status: Home / Self Care | Payer: Self-pay | Attending: Emergency Medicine | Admitting: Emergency Medicine

## 2017-02-05 ENCOUNTER — Encounter (HOSPITAL_COMMUNITY): Payer: Self-pay | Admitting: Emergency Medicine

## 2017-02-05 ENCOUNTER — Other Ambulatory Visit: Payer: Self-pay

## 2017-02-05 ENCOUNTER — Emergency Department (HOSPITAL_COMMUNITY)
Admission: EM | Admit: 2017-02-05 | Discharge: 2017-02-06 | Disposition: A | Discharge status: Home / Self Care | Payer: Self-pay | Attending: Emergency Medicine | Admitting: Emergency Medicine

## 2017-02-05 DIAGNOSIS — Z5321 Procedure and treatment not carried out due to patient leaving prior to being seen by health care provider: Secondary | ICD-10-CM | POA: Insufficient documentation

## 2017-02-05 DIAGNOSIS — R062 Wheezing: Secondary | ICD-10-CM

## 2017-02-05 DIAGNOSIS — Z9119 Patient's noncompliance with other medical treatment and regimen: Secondary | ICD-10-CM

## 2017-02-05 DIAGNOSIS — Z91199 Patient's noncompliance with other medical treatment and regimen due to unspecified reason: Secondary | ICD-10-CM

## 2017-02-05 DIAGNOSIS — R05 Cough: Secondary | ICD-10-CM

## 2017-02-05 DIAGNOSIS — R059 Cough, unspecified: Secondary | ICD-10-CM

## 2017-02-05 MED ORDER — HYDROCOD POLST-CPM POLST ER 10-8 MG/5ML PO SUER: 5.0000 mL | Freq: Once | ORAL | Status: DC

## 2017-02-05 MED ORDER — IPRATROPIUM-ALBUTEROL 0.5-2.5 (3) MG/3ML IN SOLN
3.0000 mL | Freq: Once | RESPIRATORY_TRACT | Status: AC
  Administered 2017-02-05: 3 mL via RESPIRATORY_TRACT
  Filled 2017-02-05: qty 3

## 2017-02-05 MED ORDER — DM-GUAIFENESIN ER 30-600 MG PO TB12
1.0000 | ORAL_TABLET | Freq: Two times a day (BID) | ORAL | Status: DC
  Administered 2017-02-05: 1 via ORAL
  Filled 2017-02-05: qty 1

## 2017-02-05 MED ORDER — PREDNISONE 50 MG PO TABS
50.0000 mg | ORAL_TABLET | Freq: Once | ORAL | Status: AC
  Administered 2017-02-05: 50 mg via ORAL
  Filled 2017-02-05: qty 1

## 2017-02-05 NOTE — ED Notes (Signed)
Pt called from the lobby with no response x2 

## 2017-02-05 NOTE — ED Notes (Signed)
Pt sleeping soundly in lobby with loud snoring

## 2017-02-05 NOTE — ED Notes (Signed)
1x call back no answer

## 2017-02-05 NOTE — ED Triage Notes (Signed)
Pt reports having headache that has been ongoing for a week. Pt denies any nausea, vomiting, or dizziness. Pt reports anterior pain. Pt on phone at time of triage. No acute respiratory distress noted.

## 2017-02-05 NOTE — ED Provider Notes (Signed)
[facility]-EMERGENCY DEPT Provider Note   CSN: [id number] Arrival date & time: 02/04/17  1853   History   Chief Complaint Chief Complaint  Patient presents with  . Requesting Cough Syrup    HPI Dustin Norris is a 42 y.o. male who presents with a cough. PMH significant for anxiety, homelessness, cocaine abuse. He has been seen in the ED three times for the same symptoms. He was advised that symptoms are viral and was given prescriptions multiple times. He did not fill the prescriptions he was given last time. When asked why he just shrugs his shoulders. He reports ongoing cough and wheezing. No other complaints.  HPI  Past Medical History:  Diagnosis Date  . Anxiety   . Asthma   . Cocaine abuse (HCC)   . Homelessness   . Migraine   . Tobacco abuse     Patient Active Problem List   Diagnosis Date Noted  . 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 Cocaine use disorder, moderate, dependence (HCC) 07/26/2016  . 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 Psychosis (HCC) 07/24/2016  . Chest pain 11/23/2015  . Asthma 11/23/2015  . Tobacco abuse 11/23/2015  . Elevated lactic acid level 11/23/2015  . Cocaine abuse (HCC)   . Concern about STD in male without diagnosis   . Paranoid ideation (HCC) 02/22/2015    History reviewed. No pertinent surgical history.     Home Medications    Prior to Admission medications   Medication Sig Start Date End Date Taking? Authorizing Provider  acetaminophen (TYLENOL) 500 MG tablet Take 500 mg by mouth every 6 (six) hours as needed for mild pain or headache.    [provider]  albuterol (PROVENTIL HFA;VENTOLIN HFA) 108 (90 Base) MCG/ACT inhaler Inhale 2 puffs into the lungs every 4 (four) hours as needed for wheezing or shortness of breath. 02/03/17   Muthersbaugh, Dahlia ClientHannah, PA-C  fluticasone (FLONASE) 50 MCG/ACT nasal spray Place 2 sprays into both  nostrils daily. 02/03/17   Muthersbaugh, Dahlia ClientHannah, PA-C  guaiFENesin-codeine 100-10 MG/5ML syrup Take 5 mLs by mouth 3 (three) times daily as needed for cough. 02/03/17   Janne NapoleonNeese, Hope M, NP  ibuprofen (ADVIL,MOTRIN) 800 MG tablet Take 1 tablet (800 mg total) by mouth every 8 (eight) hours as needed. 02/02/17   Lawyer, Cristal Deerhristopher, PA-C  predniSONE (DELTASONE) 10 MG tablet Take 2 tablets (20 mg total) by mouth daily. 02/03/17   Janne NapoleonNeese, Hope M, NP  promethazine-dextromethorphan (PROMETHAZINE-DM) 6.25-15 MG/5ML syrup Take 10 mLs by mouth 4 (four) times daily as needed for cough. 02/02/17   Lawyer, Cristal Deerhristopher, PA-C  terbinafine (LAMISIL AT) 1 % cream Apply 1 application topically 2 (two) times daily. Patient not taking: Reported on 02/02/2017 01/29/17   Fayrene Helperran, [place], PA-C    Family History History reviewed. No pertinent family history.  Social History Social History   Tobacco Use  . Smoking status: Current Every Day Smoker    Packs/day: 1.00    Years: 0.00    Pack years: 0.00    Types: Cigarettes, Cigars  . Smokeless tobacco: Never Used  Substance Use Topics  . Alcohol use: Yes  . Drug use: No    Comment: last use October 2017     Allergies   Haldol [haloperidol lactate]   Review of Systems Review of Systems  Constitutional: Negative for fever.  HENT: Positive for congestion.   Respiratory: Positive for cough and wheezing.      Physical Exam Updated  Vital Signs BP 112/81 (BP Location: Right Arm)   Pulse 67   Temp 98.3 F (36.8 C) (Oral)   Resp 18   SpO2 97%   Physical Exam  Constitutional: He is oriented to person, place, and time. He appears well-developed and well-nourished. No distress.  Sleeping  HENT:  Head: Normocephalic and atraumatic.  Eyes: Conjunctivae are normal. Pupils are equal, round, and reactive to light. Right eye exhibits no discharge. Left eye exhibits no discharge. No scleral icterus.  Neck: Normal range of motion.  Cardiovascular: Normal rate and regular  rhythm. Exam reveals no gallop and no friction rub.  No murmur heard. Pulmonary/Chest: Effort normal. No stridor. No respiratory distress. He has wheezes (left upper lung fields). He has no rales. He exhibits no tenderness.  Abdominal: He exhibits no distension.  Neurological: He is alert and oriented to person, place, and time.  Skin: Skin is warm and dry.  Psychiatric: He has a normal mood and affect. His behavior is normal.  Nursing note and vitals reviewed.    ED Treatments / Results  Labs (all labs ordered are listed, but only abnormal results are displayed) Labs Reviewed - No data to display  EKG  EKG Interpretation None       Radiology No results found.  Procedures Procedures (including critical care time)  Medications Ordered in ED Medications  dextromethorphan-guaiFENesin (MUCINEX DM) 30-600 MG per 12 hr tablet 1 tablet (1 tablet Oral Given 02/05/17 0337)  ipratropium-albuterol (DUONEB) 0.5-2.5 (3) MG/3ML nebulizer solution 3 mL (3 mLs Nebulization Given 02/05/17 0314)  predniSONE (DELTASONE) tablet 50 mg (50 mg Oral Given 02/05/17 1610)     Initial Impression / Assessment and Plan / ED Course  I have reviewed the triage vital signs and the nursing notes.  Pertinent labs & imaging results that were available during my care of the patient were reviewed by me and considered in my medical decision making (see chart for details).  42 year old with frequent ED visits, noncompliance, and ongoing cough and wheezing. Vitals are normal. He was given a breathing tx and a dose of cough medicine and prednisone in the ED. He has been sleeping throughout his ED stay. He was discharged in good condition.  Final Clinical Impressions(s) / ED Diagnoses   Final diagnoses:  Cough  Wheezing  Non compliance with medical treatment    ED Discharge Orders    None       Bethel Born, PA-C 02/05/17 9604    Dione Booze, MD 02/05/17 [phone]

## 2017-02-05 NOTE — ED Notes (Signed)
Patient asleep after neb treatment

## 2017-02-06 ENCOUNTER — Encounter (HOSPITAL_COMMUNITY): Payer: Self-pay

## 2017-02-06 ENCOUNTER — Emergency Department (HOSPITAL_COMMUNITY)
Admission: EM | Admit: 2017-02-06 | Discharge: 2017-02-06 | Disposition: A | Discharge status: Home / Self Care | Payer: Self-pay | Attending: Emergency Medicine | Admitting: Emergency Medicine

## 2017-02-06 ENCOUNTER — Encounter (HOSPITAL_COMMUNITY): Payer: Self-pay | Admitting: Emergency Medicine

## 2017-02-06 ENCOUNTER — Other Ambulatory Visit: Payer: Self-pay

## 2017-02-06 DIAGNOSIS — R0789 Other chest pain: Secondary | ICD-10-CM | POA: Insufficient documentation

## 2017-02-06 DIAGNOSIS — J069 Acute upper respiratory infection, unspecified: Secondary | ICD-10-CM | POA: Insufficient documentation

## 2017-02-06 DIAGNOSIS — Z91199 Patient's noncompliance with other medical treatment and regimen due to unspecified reason: Secondary | ICD-10-CM

## 2017-02-06 DIAGNOSIS — J45909 Unspecified asthma, uncomplicated: Secondary | ICD-10-CM | POA: Insufficient documentation

## 2017-02-06 DIAGNOSIS — Z79899 Other long term (current) drug therapy: Secondary | ICD-10-CM | POA: Insufficient documentation

## 2017-02-06 DIAGNOSIS — R51 Headache: Secondary | ICD-10-CM | POA: Insufficient documentation

## 2017-02-06 DIAGNOSIS — F17210 Nicotine dependence, cigarettes, uncomplicated: Secondary | ICD-10-CM | POA: Insufficient documentation

## 2017-02-06 DIAGNOSIS — B9789 Other viral agents as the cause of diseases classified elsewhere: Secondary | ICD-10-CM | POA: Insufficient documentation

## 2017-02-06 DIAGNOSIS — Z9119 Patient's noncompliance with other medical treatment and regimen: Secondary | ICD-10-CM | POA: Insufficient documentation

## 2017-02-06 DIAGNOSIS — R05 Cough: Secondary | ICD-10-CM | POA: Insufficient documentation

## 2017-02-06 DIAGNOSIS — R059 Cough, unspecified: Secondary | ICD-10-CM

## 2017-02-06 MED ORDER — DM-GUAIFENESIN ER 30-600 MG PO TB12
1.0000 | ORAL_TABLET | Freq: Two times a day (BID) | ORAL | Status: DC
  Administered 2017-02-06: 1 via ORAL
  Filled 2017-02-06: qty 1

## 2017-02-06 MED ORDER — DEXTROMETHORPHAN POLISTIREX ER 30 MG/5ML PO SUER
10.0000 mL | Freq: Once | ORAL | Status: AC
  Administered 2017-02-06: 60 mg via ORAL
  Filled 2017-02-06: qty 10

## 2017-02-06 MED ORDER — ACETAMINOPHEN 325 MG PO TABS
650.0000 mg | ORAL_TABLET | Freq: Once | ORAL | Status: AC
  Administered 2017-02-06: 650 mg via ORAL
  Filled 2017-02-06: qty 2

## 2017-02-06 MED ORDER — LORATADINE 10 MG PO TABS
10.0000 mg | ORAL_TABLET | Freq: Every day | ORAL | Status: DC
  Administered 2017-02-06: 10 mg via ORAL
  Filled 2017-02-06: qty 1

## 2017-02-06 MED ORDER — FLUTICASONE PROPIONATE 50 MCG/ACT NA SUSP
1.0000 | Freq: Every day | NASAL | Status: DC
  Administered 2017-02-06: 1 via NASAL
  Filled 2017-02-06: qty 16

## 2017-02-06 NOTE — ED Notes (Signed)
Explained discharge papers to patient, this RN offered patient a phone to call a ride, pt got upset stating "i'm from out of town, why would you even ask me that, it don't make no sense." this RN advised I was unaware pt was out of town and was simply trying to ensure he had a ride home.

## 2017-02-06 NOTE — ED Triage Notes (Signed)
Pt brought in by EMS for c/o cold symptoms  Pt states he doesn't feel well  C/O headache and sore throat  [facility]

## 2017-02-06 NOTE — ED Notes (Signed)
Called from lobby with no response x3

## 2017-02-06 NOTE — Discharge Instructions (Signed)
Take Tylenol as needed for body aches or fevers. Use your inhaler as needed for shortness of breath or chest tightness. You likely have a viral illness.  This should be treated symptomatically. Make sure you stay well-hydrated with water. Wash your hands frequently to prevent spread of infection. Return to the emergency room if you develop chest pain, difficulty breathing, or any new or worsening symptoms.

## 2017-02-06 NOTE — ED Provider Notes (Signed)
[facility] EMERGENCY DEPARTMENT Provider Note   CSN: [id number] Arrival date & time: 02/06/17  0053     History   Chief Complaint Chief Complaint  Patient presents with  . Migraine    HPI Keyen Marban is a 42 y.o. male who presents with nasal congestion. PMH significant for homelessness and recent diagnosis of viral URI. His chief complaint in triage was a headache however he tells me that his "nose is stuffed up". He reports ongoing cold symptoms which are unchanged since I saw him last night at [facility]. He reports no improvement in symptoms. He is minimally interactive with history and physical and quickly falls asleep during interview.  HPI  Past Medical History:  Diagnosis Date  . Anxiety   . Asthma   . Cocaine abuse (HCC)   . Homelessness   . Migraine   . Tobacco abuse     Patient Active Problem List   Diagnosis Date Noted  . 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 Cocaine use disorder, moderate, dependence (HCC) 07/26/2016  . 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 Psychosis (HCC) 07/24/2016  . Chest pain 11/23/2015  . Asthma 11/23/2015  . Tobacco abuse 11/23/2015  . Elevated lactic acid level 11/23/2015  . Cocaine abuse (HCC)   . Concern about STD in male without diagnosis   . Paranoid ideation (HCC) 02/22/2015    History reviewed. No pertinent surgical history.     Home Medications    Prior to Admission medications   Medication Sig Start Date End Date Taking? Authorizing Provider  acetaminophen (TYLENOL) 500 MG tablet Take 500 mg by mouth every 6 (six) hours as needed for mild pain or headache.    [provider]  albuterol (PROVENTIL HFA;VENTOLIN HFA) 108 (90 Base) MCG/ACT inhaler Inhale 2 puffs into the lungs every 4 (four) hours as needed for wheezing or shortness of breath. 02/03/17   Muthersbaugh, Dahlia Client, PA-C  fluticasone (FLONASE) 50 MCG/ACT nasal spray Place  2 sprays into both nostrils daily. 02/03/17   Muthersbaugh, Dahlia Client, PA-C  guaiFENesin-codeine 100-10 MG/5ML syrup Take 5 mLs by mouth 3 (three) times daily as needed for cough. 02/03/17   Janne Napoleon, NP  ibuprofen (ADVIL,MOTRIN) 800 MG tablet Take 1 tablet (800 mg total) by mouth every 8 (eight) hours as needed. 02/02/17   Lawyer, Cristal Deer, PA-C  predniSONE (DELTASONE) 10 MG tablet Take 2 tablets (20 mg total) by mouth daily. 02/03/17   Janne Napoleon, NP  promethazine-dextromethorphan (PROMETHAZINE-DM) 6.25-15 MG/5ML syrup Take 10 mLs by mouth 4 (four) times daily as needed for cough. 02/02/17   Lawyer, Cristal Deer, PA-C  terbinafine (LAMISIL AT) 1 % cream Apply 1 application topically 2 (two) times daily. Patient not taking: Reported on 02/02/2017 01/29/17   Fayrene Helper, PA-C    Family History No family history on file.  Social History Social History   Tobacco Use  . Smoking status: Current Every Day Smoker    Packs/day: 1.00    Years: 0.00    Pack years: 0.00    Types: Cigarettes, Cigars  . Smokeless tobacco: Never Used  Substance Use Topics  . Alcohol use: Yes  . Drug use: No    Comment: last use October 2017     Allergies   Haldol [haloperidol lactate]   Review of Systems Review of Systems  Constitutional: Negative for fever.  HENT: Positive for congestion.   Respiratory: Positive for cough.      Physical Exam  Updated Vital Signs BP 107/67   Pulse 90   Temp 97.6 F (36.4 C) (Oral)   Resp 18   Ht 5\' 7"  (1.702 m)   Wt 73 kg (161 lb)   SpO2 96%   BMI 25.22 kg/m   Physical Exam  Constitutional: He is oriented to person, place, and time. He appears well-developed and well-nourished. No distress.  Sleeping. Arouses to touch but quickly falls back asleep even while I am talking to him  HENT:  Head: Normocephalic and atraumatic.  Eyes: Conjunctivae are normal. Pupils are equal, round, and reactive to light. Right eye exhibits no discharge. Left eye exhibits no  discharge. No scleral icterus.  Neck: Normal range of motion.  Cardiovascular: Normal rate and regular rhythm. Exam reveals no gallop and no friction rub.  No murmur heard. Pulmonary/Chest: Effort normal and breath sounds normal. No stridor. No respiratory distress. He has no wheezes. He has no rales. He exhibits no tenderness.  Abdominal: He exhibits no distension.  Neurological: He is alert and oriented to person, place, and time.  Skin: Skin is warm and dry.  Psychiatric: He has a normal mood and affect. His behavior is normal.  Nursing note and vitals reviewed.    ED Treatments / Results  Labs (all labs ordered are listed, but only abnormal results are displayed) Labs Reviewed - No data to display  EKG  EKG Interpretation None       Radiology No results found.  Procedures Procedures (including critical care time)  Medications Ordered in ED Medications  dextromethorphan-guaiFENesin (MUCINEX DM) 30-600 MG per 12 hr tablet 1 tablet (1 tablet Oral Given 02/06/17 0526)  loratadine (CLARITIN) tablet 10 mg (10 mg Oral Given 02/06/17 0526)     Initial Impression / Assessment and Plan / ED Course  I have reviewed the triage vital signs and the nursing notes.  Pertinent labs & imaging results that were available during my care of the patient were reviewed by me and considered in my medical decision making (see chart for details).  42 year old male with ongoing URI symptoms. Still likely viral. Vitals are normal. Lung exam is improved from yesterday. He will be given another dose of cough medicine and Claritin however I think he is malingering and the main reason he is here is for a place to sleep, which he has been doing. He was discharged in good condition.  Final Clinical Impressions(s) / ED Diagnoses   Final diagnoses:  Cough  Non compliance with medical treatment    ED Discharge Orders    None       Bethel BornGekas, Wm Fruchter Marie, PA-C 02/06/17 [id number]    Dione BoozeGlick, David,  MD 02/06/17 [phone]

## 2017-02-06 NOTE — ED Triage Notes (Signed)
Pt c/o migraine and sensitivity to light. No other neuro deficits noted. Pt unwilling to answer questions.

## 2017-02-06 NOTE — ED Provider Notes (Signed)
[facility] EMERGENCY DEPARTMENT Provider Note   CSN: [id number] Arrival date & time: 02/06/17  4098     History   Chief Complaint No chief complaint on file.   HPI Dustin Norris is a 42 y.o. male presenting for evaluation of cough, nasal congestion, generalized body aches.  Pt has been seen multiple times in the ER  past month, and seen multiple times in the past week for the same sxs.  Patient states initially that he is going to sue because we have not been treating him.  When I asked what has not been treated, he states that he was not given cough syrup.  I inquired about this, stating that it said that he was given cough syrup, and pt subsequently states that we have tried to help him.  He states he does not have the money to get his prescriptions filled.  He reports continued cough, nasal congestion, general his body aches.  This has been going on for the past 4 days.  He denies fevers, chills, sore throat, difficulty breathing, nausea, vomiting, abdominal pain, urinary symptoms, normal bowel movements.  He states he has a history of asthma, he has his inhaler with him in his pocket.  He denies wheezing or chest tightness at this time.  He denies sick contacts.  He states he is trying to get Claris Gower to stay with his family.  Chart review, patient had a negative x-ray since symptoms began.  HPI  Past Medical History:  Diagnosis Date  . Anxiety   . Asthma   . Cocaine abuse (HCC)   . Homelessness   . Migraine   . Tobacco abuse     Patient Active Problem List   Diagnosis Date Noted  . 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 Cocaine use disorder, moderate, dependence (HCC) 07/26/2016  . 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 Psychosis (HCC) 07/24/2016  . Chest pain 11/23/2015  . Asthma 11/23/2015  . Tobacco abuse 11/23/2015  . Elevated lactic acid level 11/23/2015  . Cocaine abuse (HCC)    . Concern about STD in male without diagnosis   . Paranoid ideation (HCC) 02/22/2015    History reviewed. No pertinent surgical history.     Home Medications    Prior to Admission medications   Medication Sig Start Date End Date Taking? Authorizing Provider  acetaminophen (TYLENOL) 500 MG tablet Take 500 mg by mouth every 6 (six) hours as needed for mild pain or headache.    [provider]  albuterol (PROVENTIL HFA;VENTOLIN HFA) 108 (90 Base) MCG/ACT inhaler Inhale 2 puffs into the lungs every 4 (four) hours as needed for wheezing or shortness of breath. 02/03/17   Muthersbaugh, Dahlia Client, PA-C  fluticasone (FLONASE) 50 MCG/ACT nasal spray Place 2 sprays into both nostrils daily. 02/03/17   Muthersbaugh, Dahlia Client, PA-C  guaiFENesin-codeine 100-10 MG/5ML syrup Take 5 mLs by mouth 3 (three) times daily as needed for cough. 02/03/17   Janne Napoleon, NP  ibuprofen (ADVIL,MOTRIN) 800 MG tablet Take 1 tablet (800 mg total) by mouth every 8 (eight) hours as needed. 02/02/17   Lawyer, Cristal Deer, PA-C  predniSONE (DELTASONE) 10 MG tablet Take 2 tablets (20 mg total) by mouth daily. 02/03/17   Janne Napoleon, NP  promethazine-dextromethorphan (PROMETHAZINE-DM) 6.25-15 MG/5ML syrup Take 10 mLs by mouth 4 (four) times daily as needed for cough. 02/02/17   Lawyer, Cristal Deer, PA-C  terbinafine (LAMISIL AT) 1 % cream Apply 1 application  topically 2 (two) times daily. Patient not taking: Reported on 02/02/2017 01/29/17   Fayrene Helper, PA-C    Family History No family history on file.  Social History Social History   Tobacco Use  . Smoking status: Current Every Day Smoker    Packs/day: 1.00    Years: 0.00    Pack years: 0.00    Types: Cigarettes, Cigars  . Smokeless tobacco: Never Used  Substance Use Topics  . Alcohol use: Yes  . Drug use: No    Comment: last use October 2017     Allergies   Haldol [haloperidol lactate]   Review of Systems Review of Systems  Constitutional: Negative for  chills and fever.  HENT: Positive for congestion.   Respiratory: Positive for cough.   Musculoskeletal: Positive for myalgias.     Physical Exam Updated Vital Signs BP 111/72 (BP Location: Right Arm)   Pulse 62   Temp 97.7 F (36.5 C) (Oral)   Resp 16   SpO2 99%   Physical Exam  Constitutional: He is oriented to person, place, and time. He appears well-developed and well-nourished. No distress.  HENT:  Head: Normocephalic and atraumatic.  Right Ear: Tympanic membrane, external ear and ear canal normal.  Left Ear: Tympanic membrane, external ear and ear canal normal.  Nose: Mucosal edema present. Right sinus exhibits no maxillary sinus tenderness and no frontal sinus tenderness. Left sinus exhibits no maxillary sinus tenderness and no frontal sinus tenderness.  Mouth/Throat: Uvula is midline, oropharynx is clear and moist and mucous membranes are normal. No tonsillar exudate.  Nasal mucosal edema, right worse than left.  Tenderness palpation of frontal sinuses.  Eyes: Conjunctivae and EOM are normal. Pupils are equal, round, and reactive to light.  Neck: Normal range of motion.  Cardiovascular: Normal rate, regular rhythm and intact distal pulses.  Pulmonary/Chest: Effort normal. He has no decreased breath sounds. He has wheezes. He has no rhonchi. He has no rales.  Pt speaking in full sentences without difficulty.  Scattered wheezes.  Good air movement in all fields.   Abdominal: Soft. He exhibits no distension and no mass. There is no tenderness. There is no guarding.  Musculoskeletal: Normal range of motion.  No leg pain or swelling  Lymphadenopathy:    He has no cervical adenopathy.  Neurological: He is alert and oriented to person, place, and time.  Skin: Skin is warm.  Psychiatric: He has a normal mood and affect.  Nursing note and vitals reviewed.    ED Treatments / Results  Labs (all labs ordered are listed, but only abnormal results are displayed) Labs Reviewed -  No data to display  EKG  EKG Interpretation None       Radiology No results found.  Procedures Procedures (including critical care time)  Medications Ordered in ED Medications  acetaminophen (TYLENOL) tablet 650 mg (650 mg Oral Given 02/06/17 1507)  dextromethorphan (DELSYM) 30 MG/5ML liquid 60 mg (60 mg Oral Given 02/06/17 1508)     Initial Impression / Assessment and Plan / ED Course  I have reviewed the triage vital signs and the nursing notes.  Pertinent labs & imaging results that were available during my care of the patient were reviewed by me and considered in my medical decision making (see chart for details).     Patient presenting with several days of URI symptoms, has been evaluated for these multiple times.  Physical exam reassuring, patient is afebrile and appears nontoxic.  Pulmonary exam reassuring, mild scattered wheezing,  pt with h/o asthma.  Doubt pneumonia, strep, other bacterial infection, or peritonsillar abscess.  Likely viral URI.  Will treat symptomatically.  Patient cannot afford prescriptions, will give Tylenol here, Flonase for him to take, and 1 dose of cough syrup ear.  Discussed that he is only supposed to return to the ER for worsening or concerning symptoms, and can follow up with [place] and wellness for persistent sxs.  At this time, patient appears safe for discharge.  Return precautions given.  Patient states he understands and agrees to plan.   Final Clinical Impressions(s) / ED Diagnoses   Final diagnoses:  Upper respiratory tract infection, unspecified type    ED Discharge Orders    None       Alveria ApleyCaccavale, Wahneta Derocher, PA-C 02/06/17 1827    Shaune PollackIsaacs, Cameron, MD 02/07/17 [phone]

## 2017-02-06 NOTE — ED Notes (Signed)
Pt ambulatory out of department at discharge, nad.

## 2017-02-06 NOTE — ED Notes (Signed)
Patient asleep in lobby at this time.

## 2017-02-06 NOTE — ED Triage Notes (Signed)
Patient complains of cold symptoms with body aches x 2 days, taking tyleniol with minimal relief, NAD

## 2017-02-07 ENCOUNTER — Emergency Department (HOSPITAL_COMMUNITY)
Admission: EM | Admit: 2017-02-07 | Discharge: 2017-02-07 | Disposition: A | Discharge status: Home / Self Care | Payer: Self-pay | Attending: Emergency Medicine | Admitting: Emergency Medicine

## 2017-02-07 DIAGNOSIS — J069 Acute upper respiratory infection, unspecified: Secondary | ICD-10-CM

## 2017-02-07 DIAGNOSIS — B9789 Other viral agents as the cause of diseases classified elsewhere: Secondary | ICD-10-CM

## 2017-02-07 NOTE — ED Notes (Signed)
Bed: WTR6 Expected date:  Expected time:  Means of arrival:  Comments: 

## 2017-02-07 NOTE — Discharge Instructions (Signed)
Do not hesitate to return to the emergency room for any new, worsening or concerning symptoms. ° °Please obtain primary care using resource guide below. Let them know that you were seen in the emergency room and that they will need to obtain records for further outpatient management. ° ° °

## 2017-02-07 NOTE — ED Triage Notes (Signed)
Patient called. Patient had been asleep on the sofa in the lobby.

## 2017-02-07 NOTE — ED Provider Notes (Signed)
[facility]-EMERGENCY DEPT Provider Note   CSN: [id number] Arrival date & time: 02/06/17  2204     History   Chief Complaint Chief Complaint  Patient presents with  . Cough    HPI   Blood pressure 120/89, pulse 75, temperature 98.2 F (36.8 C), temperature source Oral, resp. rate 16, SpO2 99 %.  Dustin Norris is a 42 y.o. male with past medical history significant for asthma, cocaine abuse, homelessness complaining of cough with some discomfort in his chest that is mostly pleuritic.  He is requesting cold medication, specifically Tylenol and a sandwich.  He denies fever, chills, shortness of breath, abdominal pain, vomiting change in bowel or bladder habits  Past Medical History:  Diagnosis Date  . Anxiety   . Asthma   . Cocaine abuse (HCC)   . Homelessness   . Migraine   . Tobacco abuse     Patient Active Problem List   Diagnosis Date Noted  . 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 Cocaine use disorder, moderate, dependence (HCC) 07/26/2016  . 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 Psychosis (HCC) 07/24/2016  . Chest pain 11/23/2015  . Asthma 11/23/2015  . Tobacco abuse 11/23/2015  . Elevated lactic acid level 11/23/2015  . Cocaine abuse (HCC)   . Concern about STD in male without diagnosis   . Paranoid ideation (HCC) 02/22/2015    History reviewed. No pertinent surgical history.     Home Medications    Prior to Admission medications   Medication Sig Start Date End Date Taking? Authorizing Provider  acetaminophen (TYLENOL) 500 MG tablet Take 500 mg by mouth every 6 (six) hours as needed for mild pain or headache.    [provider]  albuterol (PROVENTIL HFA;VENTOLIN HFA) 108 (90 Base) MCG/ACT inhaler Inhale 2 puffs into the lungs every 4 (four) hours as needed for wheezing or shortness of breath. 02/03/17   Muthersbaugh, Dahlia Client, PA-C  fluticasone (FLONASE) 50  MCG/ACT nasal spray Place 2 sprays into both nostrils daily. 02/03/17   Muthersbaugh, Dahlia Client, PA-C  guaiFENesin-codeine 100-10 MG/5ML syrup Take 5 mLs by mouth 3 (three) times daily as needed for cough. 02/03/17   Janne Napoleon, NP  ibuprofen (ADVIL,MOTRIN) 800 MG tablet Take 1 tablet (800 mg total) by mouth every 8 (eight) hours as needed. 02/02/17   Lawyer, Cristal Deer, PA-C  predniSONE (DELTASONE) 10 MG tablet Take 2 tablets (20 mg total) by mouth daily. 02/03/17   Janne Napoleon, NP  promethazine-dextromethorphan (PROMETHAZINE-DM) 6.25-15 MG/5ML syrup Take 10 mLs by mouth 4 (four) times daily as needed for cough. 02/02/17   Lawyer, Cristal Deer, PA-C  terbinafine (LAMISIL AT) 1 % cream Apply 1 application topically 2 (two) times daily. Patient not taking: Reported on 02/02/2017 01/29/17   Fayrene Helper, PA-C    Family History History reviewed. No pertinent family history.  Social History Social History   Tobacco Use  . Smoking status: Current Every Day Smoker    Packs/day: 1.00    Years: 0.00    Pack years: 0.00    Types: Cigarettes, Cigars  . Smokeless tobacco: Never Used  Substance Use Topics  . Alcohol use: Yes  . Drug use: No    Comment: last use October 2017     Allergies   Haldol [haloperidol lactate]   Review of Systems Review of Systems  A complete review of systems was obtained and all systems are negative except as noted in the HPI and PMH.  Physical Exam Updated Vital Signs BP 120/89 (BP Location: Right Arm)   Pulse 75   Temp 98.2 F (36.8 C) (Oral)   Resp 16   SpO2 99%   Physical Exam  Constitutional: He is oriented to person, place, and time. He appears well-developed and well-nourished. No distress.  HENT:  Head: Normocephalic.  Mouth/Throat: Oropharynx is clear and moist.  Eyes: Conjunctivae are normal.  Neck: Normal range of motion. No JVD present. No tracheal deviation present.  Cardiovascular: Normal rate, regular rhythm and intact distal pulses.  Radial  pulse equal bilaterally  Pulmonary/Chest: Effort normal and breath sounds normal. No stridor. No respiratory distress. He has no wheezes. He has no rales. He exhibits no tenderness.  Abdominal: Soft. He exhibits no distension and no mass. There is no tenderness. There is no rebound and no guarding.  Musculoskeletal: Normal range of motion. He exhibits no edema or tenderness.  No calf asymmetry, superficial collaterals, palpable cords, edema, Homans sign negative bilaterally.    Neurological: He is alert and oriented to person, place, and time.  Skin: Skin is warm. He is not diaphoretic.  Psychiatric: He has a normal mood and affect.  Nursing note and vitals reviewed.    ED Treatments / Results  Labs (all labs ordered are listed, but only abnormal results are displayed) Labs Reviewed - No data to display  EKG  EKG Interpretation None       Radiology No results found.  Procedures Procedures (including critical care time)  Medications Ordered in ED Medications - No data to display   Initial Impression / Assessment and Plan / ED Course  I have reviewed the triage vital signs and the nursing notes.  Pertinent labs & imaging results that were available during my care of the patient were reviewed by me and considered in my medical decision making (see chart for details).     Vitals:   02/06/17 2217 02/07/17 1016  BP: (!) 146/93 120/89  Pulse: 74 75  Resp: 20 16  Temp: 98.7 F (37.1 C) 98.2 F (36.8 C)  TempSrc: Oral Oral  SpO2: 100% 99%    Dustin Norris is 42 y.o. male presenting with cough and some pleuritic chest pain.  Lung sounds clear to auscultation.  This does not appear to be cardiac in nature.  Vital signs reassuring.  Patient given resource guide and encourage appropriate outpatient follow-up.  Evaluation does not show pathology that would require ongoing emergent intervention or inpatient treatment. Pt is hemodynamically stable and mentating  appropriately. Discussed findings and plan with patient/guardian, who agrees with care plan. All questions answered. Return precautions discussed and outpatient follow up given.    Final Clinical Impressions(s) / ED Diagnoses   Final diagnoses:  Viral URI with cough    ED Discharge Orders    None       Kaylyn Limisciotta, Jasaiah Karwowski, PA-C 02/07/17 1235    Derwood KaplanNanavati, Ankit, MD 02/08/17 [phone]

## 2017-02-07 NOTE — ED Notes (Signed)
Provider entered patient's room to discuss POC with patient and patient was found to have eloped AMA.

## 2017-02-08 ENCOUNTER — Encounter (HOSPITAL_COMMUNITY): Payer: Self-pay | Admitting: Emergency Medicine

## 2017-02-08 ENCOUNTER — Emergency Department (HOSPITAL_COMMUNITY): Admission: EM | Admit: 2017-02-08 | Discharge: 2017-02-08 | Discharge status: Against Medical Advice | Payer: Self-pay

## 2017-02-08 DIAGNOSIS — M79605 Pain in left leg: Secondary | ICD-10-CM | POA: Insufficient documentation

## 2017-02-08 DIAGNOSIS — M79604 Pain in right leg: Secondary | ICD-10-CM | POA: Insufficient documentation

## 2017-02-08 DIAGNOSIS — J45909 Unspecified asthma, uncomplicated: Secondary | ICD-10-CM | POA: Insufficient documentation

## 2017-02-08 DIAGNOSIS — F17210 Nicotine dependence, cigarettes, uncomplicated: Secondary | ICD-10-CM | POA: Insufficient documentation

## 2017-02-08 NOTE — ED Triage Notes (Signed)
Pt stated he has bilateral leg pain that began today that he rates 10/10. Pt states pain is sharp and cramping. Pt is able to ambulate with no difficulty. Pt denies injury or trauma.

## 2017-02-09 ENCOUNTER — Emergency Department (HOSPITAL_COMMUNITY)
Admission: EM | Admit: 2017-02-09 | Discharge: 2017-02-09 | Disposition: A | Discharge status: Home / Self Care | Payer: Self-pay | Attending: Emergency Medicine | Admitting: Emergency Medicine

## 2017-02-09 DIAGNOSIS — M79605 Pain in left leg: Secondary | ICD-10-CM

## 2017-02-09 DIAGNOSIS — M79604 Pain in right leg: Secondary | ICD-10-CM

## 2017-02-09 MED ORDER — IBUPROFEN 800 MG PO TABS
800.0000 mg | ORAL_TABLET | Freq: Once | ORAL | Status: AC
  Administered 2017-02-09: 800 mg via ORAL
  Filled 2017-02-09: qty 1

## 2017-02-09 MED ORDER — DICLOFENAC SODIUM 75 MG PO TBEC: 75.0000 mg | DELAYED_RELEASE_TABLET | Freq: Two times a day (BID) | ORAL | 0 refills | Status: DC

## 2017-02-09 NOTE — ED Provider Notes (Signed)
[facility]-EMERGENCY DEPT Provider Note   CSN: [id number] Arrival date & time: 02/08/17  2143     History   Chief Complaint Chief Complaint  Patient presents with  . Leg Pain    HPI Dustin Norris is a 42 y.o. male.  The history is provided by the patient. No language interpreter was used.  Leg Pain   This is a chronic problem. The current episode started more than 1 week ago. The problem occurs constantly. The pain is moderate. Pertinent negatives include full range of motion. He has tried nothing for the symptoms. The treatment provided no relief. There has been no history of extremity trauma.    Past Medical History:  Diagnosis Date  . Anxiety   . Asthma   . Cocaine abuse (HCC)   . Homelessness   . Migraine   . Tobacco abuse     Patient Active Problem List   Diagnosis Date Noted  . 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 Cocaine use disorder, moderate, dependence (HCC) 07/26/2016  . 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 Psychosis (HCC) 07/24/2016  . Chest pain 11/23/2015  . Asthma 11/23/2015  . Tobacco abuse 11/23/2015  . Elevated lactic acid level 11/23/2015  . Cocaine abuse (HCC)   . Concern about STD in male without diagnosis   . Paranoid ideation (HCC) 02/22/2015    History reviewed. No pertinent surgical history.     Home Medications    Prior to Admission medications   Medication Sig Start Date End Date Taking? Authorizing Provider  acetaminophen (TYLENOL) 500 MG tablet Take 500 mg by mouth every 8 (eight) hours as needed for mild pain or headache.    Yes [provider]  albuterol (PROVENTIL HFA;VENTOLIN HFA) 108 (90 Base) MCG/ACT inhaler Inhale 2 puffs into the lungs every 4 (four) hours as needed for wheezing or shortness of breath. 02/03/17  Yes Muthersbaugh, Dahlia ClientHannah, PA-C  fluticasone (FLONASE) 50 MCG/ACT nasal spray Place 2 sprays into both nostrils  daily. 02/03/17  Yes Muthersbaugh, Dahlia ClientHannah, PA-C  guaiFENesin-codeine 100-10 MG/5ML syrup Take 5 mLs by mouth 3 (three) times daily as needed for cough. 02/03/17  Yes Neese, Hope M, NP  ibuprofen (ADVIL,MOTRIN) 800 MG tablet Take 1 tablet (800 mg total) by mouth every 8 (eight) hours as needed. 02/02/17  Yes Lawyer, Cristal Deerhristopher, PA-C  predniSONE (DELTASONE) 10 MG tablet Take 2 tablets (20 mg total) by mouth daily. 02/03/17  Yes Neese, Hope M, NP  promethazine-dextromethorphan (PROMETHAZINE-DM) 6.25-15 MG/5ML syrup Take 10 mLs by mouth 4 (four) times daily as needed for cough. 02/02/17  Yes Lawyer, Cristal Deerhristopher, PA-C  diclofenac (VOLTAREN) 75 MG EC tablet Take 1 tablet (75 mg total) by mouth 2 (two) times daily. 02/09/17   Elson AreasSofia, Errin Whitelaw K, PA-C  terbinafine (LAMISIL AT) 1 % cream Apply 1 application topically 2 (two) times daily. Patient not taking: Reported on 02/02/2017 01/29/17   Fayrene Helperran, Bowie, PA-C    Family History No family history on file.  Social History Social History   Tobacco Use  . Smoking status: Current Every Day Smoker    Packs/day: 1.00    Years: 0.00    Pack years: 0.00    Types: Cigarettes, Cigars  . Smokeless tobacco: Never Used  Substance Use Topics  . Alcohol use: Yes  . Drug use: No    Comment: last use October 2017     Allergies   Haldol [haloperidol lactate]   Review of Systems Review  of Systems  All other systems reviewed and are negative.    Physical Exam Updated Vital Signs BP (!) 152/96 (BP Location: Left Arm)   Pulse 85   Temp 98.8 F (37.1 C) (Oral)   SpO2 95%   Physical Exam  Constitutional: He appears well-developed and well-nourished.  HENT:  Head: Normocephalic.  Cardiovascular: Normal rate.  Pulmonary/Chest: Effort normal.  Musculoskeletal: He exhibits tenderness.  Neurological: He is alert.  Skin: Skin is warm.  Psychiatric: He has a normal mood and affect.  Nursing note and vitals reviewed.    ED Treatments / Results  Labs (all labs  ordered are listed, but only abnormal results are displayed) Labs Reviewed - No data to display  EKG  EKG Interpretation None       Radiology No results found.  Procedures Procedures (including critical care time)  Medications Ordered in ED Medications  ibuprofen (ADVIL,MOTRIN) tablet 800 mg (not administered)     Initial Impression / Assessment and Plan / ED Course  I have reviewed the triage vital signs and the nursing notes.  Pertinent labs & imaging results that were available during my care of the patient were reviewed by me and considered in my medical decision making (see chart for details).     Pt is here almost daily.  He is homeless.  He has multiple complaints.  Pt reports he can fill rx for voltaren   Final Clinical Impressions(s) / ED Diagnoses   Final diagnoses:  Bilateral leg pain    ED Discharge Orders        Ordered    diclofenac (VOLTAREN) 75 MG EC tablet  2 times daily     02/09/17 0821       Elson Areas, PA-C 02/09/17 1610    Geoffery Lyons, MD 02/10/17 0345

## 2017-02-11 ENCOUNTER — Encounter (HOSPITAL_COMMUNITY): Payer: Self-pay | Admitting: Nurse Practitioner

## 2017-02-11 DIAGNOSIS — M79604 Pain in right leg: Secondary | ICD-10-CM | POA: Insufficient documentation

## 2017-02-11 DIAGNOSIS — Z5321 Procedure and treatment not carried out due to patient leaving prior to being seen by health care provider: Secondary | ICD-10-CM | POA: Insufficient documentation

## 2017-02-11 DIAGNOSIS — M79605 Pain in left leg: Secondary | ICD-10-CM | POA: Insufficient documentation

## 2017-02-11 NOTE — ED Triage Notes (Signed)
Pt is c/o bilateral leg pain, similar complaint evaluated on 02/09/17.

## 2017-02-12 ENCOUNTER — Encounter (HOSPITAL_COMMUNITY): Payer: Self-pay | Admitting: Emergency Medicine

## 2017-02-12 ENCOUNTER — Emergency Department (HOSPITAL_COMMUNITY)
Admission: EM | Admit: 2017-02-12 | Discharge: 2017-02-12 | Discharge status: Against Medical Advice | Payer: Self-pay | Attending: Emergency Medicine | Admitting: Emergency Medicine

## 2017-02-12 ENCOUNTER — Other Ambulatory Visit: Payer: Self-pay

## 2017-02-12 ENCOUNTER — Emergency Department (HOSPITAL_COMMUNITY)
Admission: EM | Admit: 2017-02-12 | Discharge: 2017-02-12 | Disposition: A | Discharge status: Home / Self Care | Payer: Self-pay | Attending: Emergency Medicine | Admitting: Emergency Medicine

## 2017-02-12 ENCOUNTER — Encounter (HOSPITAL_COMMUNITY): Payer: Self-pay

## 2017-02-12 DIAGNOSIS — F17210 Nicotine dependence, cigarettes, uncomplicated: Secondary | ICD-10-CM | POA: Insufficient documentation

## 2017-02-12 DIAGNOSIS — J45909 Unspecified asthma, uncomplicated: Secondary | ICD-10-CM | POA: Insufficient documentation

## 2017-02-12 DIAGNOSIS — Z590 Homelessness: Secondary | ICD-10-CM | POA: Insufficient documentation

## 2017-02-12 DIAGNOSIS — Z79899 Other long term (current) drug therapy: Secondary | ICD-10-CM | POA: Insufficient documentation

## 2017-02-12 DIAGNOSIS — F419 Anxiety disorder, unspecified: Secondary | ICD-10-CM | POA: Insufficient documentation

## 2017-02-12 MED ORDER — LORAZEPAM 1 MG PO TABS
1.0000 mg | ORAL_TABLET | Freq: Once | ORAL | Status: AC
  Administered 2017-02-12: 1 mg via ORAL
  Filled 2017-02-12: qty 1

## 2017-02-12 MED ORDER — ACETAMINOPHEN 325 MG PO TABS
650.0000 mg | ORAL_TABLET | Freq: Once | ORAL | Status: AC
Start: 2017-02-12 — End: 2017-02-12
  Administered 2017-02-12: 650 mg via ORAL
  Filled 2017-02-12: qty 2

## 2017-02-12 NOTE — ED Notes (Signed)
Bed: [facility]PT4 Expected date:  Expected time:  Means of arrival:  Comments: 

## 2017-02-12 NOTE — ED Provider Notes (Signed)
[facility]-EMERGENCY DEPT Provider Note   CSN: [id number] Arrival date & time: 02/12/17  1633     History   Chief Complaint Chief Complaint  Patient presents with  . Chest Pain    HPI Dustin Norris is a 42 y.o. male.  Patient c/o feeling anxious, and generally weak,  brought by EMS from [facility]. States he is homeless, and is undecided if wants to go to shelter or try to call a family member. He denies other specific physical symptoms. No chest pain or discomfort. No sob. No cough or uri c/o. No fever or chills. Normal appetite. Denies depression or thoughts of self harm. Patient is poor historian - level 5 caveat.    The history is provided by the patient and the EMS personnel. The history is limited by the condition of the patient.  Chest Pain   Pertinent negatives include no abdominal pain, no back pain, no fever, no headaches and no shortness of breath.    Past Medical History:  Diagnosis Date  . Anxiety   . Asthma   . Cocaine abuse (HCC)   . Homelessness   . Migraine   . Tobacco abuse     Patient Active Problem List   Diagnosis Date Noted  . 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 Cocaine use disorder, moderate, dependence (HCC) 07/26/2016  . 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 Psychosis (HCC) 07/24/2016  . Chest pain 11/23/2015  . Asthma 11/23/2015  . Tobacco abuse 11/23/2015  . Elevated lactic acid level 11/23/2015  . Cocaine abuse (HCC)   . Concern about STD in male without diagnosis   . Paranoid ideation (HCC) 02/22/2015    History reviewed. No pertinent surgical history.     Home Medications    Prior to Admission medications   Medication Sig Start Date End Date Taking? Authorizing Provider  acetaminophen (TYLENOL) 500 MG tablet Take 500 mg by mouth every 8 (eight) hours as needed for mild pain or headache.     [provider]  albuterol (PROVENTIL  HFA;VENTOLIN HFA) 108 (90 Base) MCG/ACT inhaler Inhale 2 puffs into the lungs every 4 (four) hours as needed for wheezing or shortness of breath. 02/03/17   Muthersbaugh, Dahlia Client, PA-C  diclofenac (VOLTAREN) 75 MG EC tablet Take 1 tablet (75 mg total) by mouth 2 (two) times daily. 02/09/17   Elson Areas, PA-C  fluticasone (FLONASE) 50 MCG/ACT nasal spray Place 2 sprays into both nostrils daily. 02/03/17   Muthersbaugh, Dahlia Client, PA-C  guaiFENesin-codeine 100-10 MG/5ML syrup Take 5 mLs by mouth 3 (three) times daily as needed for cough. 02/03/17   Janne Napoleon, NP  ibuprofen (ADVIL,MOTRIN) 800 MG tablet Take 1 tablet (800 mg total) by mouth every 8 (eight) hours as needed. Patient taking differently: Take 800 mg by mouth every 8 (eight) hours as needed (pain).  02/02/17   Lawyer, Cristal Deer, PA-C  predniSONE (DELTASONE) 10 MG tablet Take 2 tablets (20 mg total) by mouth daily. 02/03/17   Janne Napoleon, NP  promethazine-dextromethorphan (PROMETHAZINE-DM) 6.25-15 MG/5ML syrup Take 10 mLs by mouth 4 (four) times daily as needed for cough. 02/02/17   Lawyer, Cristal Deer, PA-C  terbinafine (LAMISIL AT) 1 % cream Apply 1 application topically 2 (two) times daily. Patient not taking: Reported on 02/02/2017 01/29/17   Fayrene Helper, PA-C    Family History History reviewed. No pertinent family history.  Social History Social History   Tobacco Use  . Smoking status:  Current Every Day Smoker    Packs/day: 1.00    Years: 0.00    Pack years: 0.00    Types: Cigarettes, Cigars  . Smokeless tobacco: Never Used  Substance Use Topics  . Alcohol use: Yes  . Drug use: No    Comment: last use October 2017     Allergies   Haldol [haloperidol lactate]   Review of Systems Review of Systems  Constitutional: Negative for fever.  HENT: Negative for sore throat.   Eyes: Negative for redness.  Respiratory: Negative for shortness of breath.   Cardiovascular: Negative for chest pain.  Gastrointestinal: Negative for  abdominal pain.  Genitourinary: Negative for flank pain.  Musculoskeletal: Negative for back pain and neck pain.  Skin: Negative for rash.  Neurological: Negative for headaches.  Hematological: Does not bruise/bleed easily.  Psychiatric/Behavioral: Positive for suicidal ideas. The patient is nervous/anxious.      Physical Exam Updated Vital Signs BP (!) 145/93 (BP Location: Right Arm)   Pulse (!) 131   Temp 99 F (37.2 C) (Oral)   Resp 18   Ht 1.702 m (5\' 7" )   Wt 73 kg (161 lb)   SpO2 100%   BMI 25.22 kg/m   Physical Exam  Constitutional: He appears well-developed and well-nourished. No distress.  HENT:  Head: Atraumatic.  Mouth/Throat: Oropharynx is clear and moist.  Eyes: Conjunctivae are normal. Pupils are equal, round, and reactive to light.  Neck: Neck supple. No tracheal deviation present.  Cardiovascular: Normal rate, normal heart sounds and intact distal pulses. Exam reveals no gallop and no friction rub.  No murmur heard. Pulmonary/Chest: Effort normal and breath sounds normal. No accessory muscle usage. No respiratory distress.  Abdominal: Soft. Bowel sounds are normal. He exhibits no distension. There is no tenderness.  Genitourinary:  Genitourinary Comments: No cva tenderness  Musculoskeletal: He exhibits no edema or tenderness.  Neurological: He is alert.  Speech clear/fluent. Motor grossly intact bil. Steady gait.   Skin: Skin is warm and dry. He is not diaphoretic.  Psychiatric: He has a normal mood and affect.  Nursing note and vitals reviewed.    ED Treatments / Results  Labs (all labs ordered are listed, but only abnormal results are displayed) Labs Reviewed - No data to display  EKG  EKG Interpretation  Date/Time:  Saturday February 12 2017 16:50:00 EST Ventricular Rate:  101 PR Interval:    QRS Duration: 83 QT Interval:  339 QTC Calculation: 440 R Axis:   61 Text Interpretation:  Sinus tachycardia Confirmed by Cathren LaineSteinl, Raykwon Hobbs ([id number]) on  02/12/2017 4:59:19 PM       Radiology No results found.  Procedures Procedures (including critical care time)  Medications Ordered in ED Medications - No data to display   Initial Impression / Assessment and Plan / ED Course  I have reviewed the triage vital signs and the nursing notes.  Pertinent labs & imaging results that were available during my care of the patient were reviewed by me and considered in my medical decision making (see chart for details).  Patient in sinus rhythm/sinus tachycardia on monitor.   Reviewed nursing notes and prior charts for additional history.  Is noted from review charts/labs to have hx cocaine abuse, and frequent ED visits.  Labs.   Po fluids.  Patient denies depression or thoughts of self harm. Denies pain or other physcial c/o.   Went to recheck, and pt had left ED ama prior to completion eval and tx.   Final Clinical  Impressions(s) / ED Diagnoses   Final diagnoses:  None    ED Discharge Orders    None       Cathren Laine, MD 02/13/17 [phone]

## 2017-02-12 NOTE — ED Provider Notes (Addendum)
[facility]-EMERGENCY DEPT Provider Note   CSN: [id number] Arrival date & time: 02/12/17  1830     History   Chief Complaint Chief Complaint  Patient presents with  . Migraine  . Chest Pain    HPI Dustin Norris is a 42 y.o. male.  Patient with hx anxiety, cocaine abuse, presents indicating he thinks he is having anxiety. Was in ED earlier tonight with similar symptoms. States has very mild headache, gradual onset, frontal, c/w prior headaches. Denies head injury. No nv. Pt denies chest pain or sob. No cough or uri c/o. No fever or chills. Denies depression or thoughts of self harm. Pt poor historian. xccowboys   The history is provided by the patient. The history is limited by the condition of the patient.  Migraine  Pertinent negatives include no chest pain, no abdominal pain, no headaches and no shortness of breath.  Chest Pain   Pertinent negatives include no abdominal pain, no back pain, no fever, no headaches and no shortness of breath.    Past Medical History:  Diagnosis Date  . Anxiety   . Asthma   . Cocaine abuse (HCC)   . Homelessness   . Migraine   . Tobacco abuse     Patient Active Problem List   Diagnosis Date Noted  . 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 Cocaine use disorder, moderate, dependence (HCC) 07/26/2016  . 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 Psychosis (HCC) 07/24/2016  . Chest pain 11/23/2015  . Asthma 11/23/2015  . Tobacco abuse 11/23/2015  . Elevated lactic acid level 11/23/2015  . Cocaine abuse (HCC)   . Concern about STD in male without diagnosis   . Paranoid ideation (HCC) 02/22/2015    History reviewed. No pertinent surgical history.     Home Medications    Prior to Admission medications   Medication Sig Start Date End Date Taking? Authorizing Provider  acetaminophen (TYLENOL) 500 MG tablet Take 500 mg by mouth every 8 (eight) hours  as needed for mild pain or headache.     [provider]  albuterol (PROVENTIL HFA;VENTOLIN HFA) 108 (90 Base) MCG/ACT inhaler Inhale 2 puffs into the lungs every 4 (four) hours as needed for wheezing or shortness of breath. 02/03/17   Muthersbaugh, Dahlia ClientHannah, PA-C  diclofenac (VOLTAREN) 75 MG EC tablet Take 1 tablet (75 mg total) by mouth 2 (two) times daily. 02/09/17   Elson AreasSofia, Leslie K, PA-C  fluticasone (FLONASE) 50 MCG/ACT nasal spray Place 2 sprays into both nostrils daily. 02/03/17   Muthersbaugh, Dahlia ClientHannah, PA-C  guaiFENesin-codeine 100-10 MG/5ML syrup Take 5 mLs by mouth 3 (three) times daily as needed for cough. 02/03/17   Janne NapoleonNeese, Hope M, NP  ibuprofen (ADVIL,MOTRIN) 800 MG tablet Take 1 tablet (800 mg total) by mouth every 8 (eight) hours as needed. Patient taking differently: Take 800 mg by mouth every 8 (eight) hours as needed (pain).  02/02/17   Lawyer, Cristal Deerhristopher, PA-C  predniSONE (DELTASONE) 10 MG tablet Take 2 tablets (20 mg total) by mouth daily. 02/03/17   Janne NapoleonNeese, Hope M, NP  promethazine-dextromethorphan (PROMETHAZINE-DM) 6.25-15 MG/5ML syrup Take 10 mLs by mouth 4 (four) times daily as needed for cough. 02/02/17   Lawyer, Cristal Deerhristopher, PA-C  terbinafine (LAMISIL AT) 1 % cream Apply 1 application topically 2 (two) times daily. Patient not taking: Reported on 02/02/2017 01/29/17   Fayrene Helperran, Bowie, PA-C    Family History History reviewed. No pertinent family history.  Social History Social  History   Tobacco Use  . Smoking status: Current Every Day Smoker    Packs/day: 1.00    Years: 0.00    Pack years: 0.00    Types: Cigarettes, Cigars  . Smokeless tobacco: Never Used  Substance Use Topics  . Alcohol use: Yes  . Drug use: No    Comment: last use October 2017     Allergies   Haldol [haloperidol lactate]   Review of Systems Review of Systems  Constitutional: Negative for fever.  HENT: Negative for sore throat.   Eyes: Negative for redness.  Respiratory: Negative for shortness of  breath.   Cardiovascular: Negative for chest pain.  Gastrointestinal: Negative for abdominal pain.  Genitourinary: Negative for flank pain.  Musculoskeletal: Negative for back pain and neck pain.  Skin: Negative for rash.  Neurological: Negative for headaches.  Hematological: Does not bruise/bleed easily.  Psychiatric/Behavioral: Negative for confusion and suicidal ideas. The patient is nervous/anxious.      Physical Exam Updated Vital Signs BP (!) 152/98 (BP Location: Left Arm)   Pulse 87   Temp 98.3 F (36.8 C) (Oral)   Resp 18   SpO2 100%   Physical Exam  Constitutional: He appears well-developed and well-nourished. No distress.  HENT:  Head: Atraumatic.  Mouth/Throat: Oropharynx is clear and moist.  No sinus or temporal tenderness.   Eyes: Conjunctivae are normal.  Neck: Neck supple. No tracheal deviation present.  No stiffness or rigidity.   Cardiovascular: Normal rate, regular rhythm, normal heart sounds and intact distal pulses.  Pulmonary/Chest: Effort normal and breath sounds normal. No accessory muscle usage. No respiratory distress.  Abdominal: Soft. Bowel sounds are normal. He exhibits no distension.  Musculoskeletal: He exhibits no edema.  Neurological: He is alert.  Speech clear/fluent. Motor intact bil, stre 5/5. sens grossly intact. Steady gait.   Skin: Skin is warm and dry. He is not diaphoretic.  Psychiatric:  Anxious.   Nursing note and vitals reviewed.    ED Treatments / Results  Labs (all labs ordered are listed, but only abnormal results are displayed) Labs Reviewed - No data to display  EKG  EKG Interpretation None       Radiology No results found.  Procedures Procedures (including critical care time)  Medications Ordered in ED Medications  acetaminophen (TYLENOL) tablet 650 mg (not administered)  LORazepam (ATIVAN) tablet 1 mg (not administered)     Initial Impression / Assessment and Plan / ED Course  I have reviewed the  triage vital signs and the nursing notes.  Pertinent labs & imaging results that were available during my care of the patient were reviewed by me and considered in my medical decision making (see chart for details).  Acetaminophen po.  Ativan 1 mg po.  Reviewed nursing notes and prior charts for additional history.   Pt calm and alert.   Po fluids.   Patient currently appears stable for d/c.     Final Clinical Impressions(s) / ED Diagnoses   Final diagnoses:  None    ED Discharge Orders    None           Cathren Laine, MD 02/12/17 2010

## 2017-02-12 NOTE — ED Triage Notes (Addendum)
BIB EMS for chest pain today from [employer]. Pt states he hasn't been feeling good. EMSnoted lower crackles and gave 5mg  albuterol tx en route. Pt states he called EMS because his chest hurt but he just wants to "call get out of here and call his family to come get him."  Pt has been informed that he would be leaving AMA and that if he is here he can be seen by a physcian

## 2017-02-12 NOTE — ED Notes (Signed)
Bed: WA08 Expected date: 02/12/17 Expected time:  Means of arrival:  Comments: Hold for EMS

## 2017-02-12 NOTE — ED Triage Notes (Signed)
Pt reports continued CP and migraine for the past few weeks. Denies any new or worsening symptoms. Pt seen earlier today for same.

## 2017-02-12 NOTE — ED Notes (Signed)
Upon entry to pt's room, HR seen to be 130-140. MD Stinel made aware. Pt wanting to be discharged and requesting multiple times to be discharged currently. C/o chest pain. Waiting for orders or MD to come see pt.

## 2017-02-12 NOTE — Discharge Instructions (Signed)
It was our pleasure to provide your ER care today - we hope that you feel better.  Follow up with primary care doctor in the coming week. Have blood pressure rechecked then.   Avoid any cocaine use.  Return to ER if worse, new symptoms, fevers, trouble breathing, other concern.  You were given medication in the ER - no driving for the next 6 hours.

## 2017-02-12 NOTE — ED Notes (Signed)
Pt walked out, ambulatory, to the lobby stating he does not want to be seen or treated and states he wants to call his cousin and wait until they come. Pt left before able to obtain AMA signature

## 2017-02-13 ENCOUNTER — Emergency Department (HOSPITAL_COMMUNITY)
Admission: EM | Admit: 2017-02-13 | Discharge: 2017-02-13 | Disposition: A | Discharge status: Home / Self Care | Payer: Self-pay | Attending: Emergency Medicine | Admitting: Emergency Medicine

## 2017-02-13 ENCOUNTER — Encounter (HOSPITAL_COMMUNITY): Payer: Self-pay | Admitting: Emergency Medicine

## 2017-02-13 DIAGNOSIS — G4489 Other headache syndrome: Secondary | ICD-10-CM | POA: Insufficient documentation

## 2017-02-13 DIAGNOSIS — F17210 Nicotine dependence, cigarettes, uncomplicated: Secondary | ICD-10-CM | POA: Insufficient documentation

## 2017-02-13 DIAGNOSIS — J45909 Unspecified asthma, uncomplicated: Secondary | ICD-10-CM | POA: Insufficient documentation

## 2017-02-13 NOTE — ED Provider Notes (Signed)
TIME SEEN: 6:23 AM  CHIEF COMPLAINT: Headache  HPI: Patient is a 42 year old male with history of substance abuse and homelessness who presents to the headache.  This has been an intermittent issue for him for some time.  Patient was just seen in the emergency department last night and was discharged.  He has been sleeping in the waiting room overnight and when security woke him up this morning to leave he checked back in.  No head injury.  No numbness, tingling or focal weakness.  No fever, neck pain or neck stiffness.  ROS: See HPI Constitutional: no fever  Eyes: no drainage  ENT: no runny nose   Cardiovascular:  no chest pain  Resp: no SOB  GI: no vomiting GU: no dysuria Integumentary: no rash  Allergy: no hives  Musculoskeletal: no leg swelling  Neurological: no slurred speech ROS otherwise negative  PAST MEDICAL HISTORY/PAST SURGICAL HISTORY:  Past Medical History:  Diagnosis Date  . Anxiety   . Asthma   . Cocaine abuse (HCC)   . Homelessness   . Migraine   . Tobacco abuse     MEDICATIONS:  Prior to Admission medications   Medication Sig Start Date End Date Taking? Authorizing Provider  acetaminophen (TYLENOL) 500 MG tablet Take 500 mg by mouth every 8 (eight) hours as needed for mild pain or headache.     [provider]  albuterol (PROVENTIL HFA;VENTOLIN HFA) 108 (90 Base) MCG/ACT inhaler Inhale 2 puffs into the lungs every 4 (four) hours as needed for wheezing or shortness of breath. 02/03/17   Muthersbaugh, Dahlia Client, PA-C  diclofenac (VOLTAREN) 75 MG EC tablet Take 1 tablet (75 mg total) by mouth 2 (two) times daily. 02/09/17   Elson Areas, PA-C  fluticasone (FLONASE) 50 MCG/ACT nasal spray Place 2 sprays into both nostrils daily. 02/03/17   Muthersbaugh, Dahlia Client, PA-C  guaiFENesin-codeine 100-10 MG/5ML syrup Take 5 mLs by mouth 3 (three) times daily as needed for cough. 02/03/17   Janne Napoleon, NP  ibuprofen (ADVIL,MOTRIN) 800 MG tablet Take 1 tablet (800 mg total)  by mouth every 8 (eight) hours as needed. Patient taking differently: Take 800 mg by mouth every 8 (eight) hours as needed (pain).  02/02/17   Lawyer, Cristal Deer, PA-C  predniSONE (DELTASONE) 10 MG tablet Take 2 tablets (20 mg total) by mouth daily. 02/03/17   Janne Napoleon, NP  promethazine-dextromethorphan (PROMETHAZINE-DM) 6.25-15 MG/5ML syrup Take 10 mLs by mouth 4 (four) times daily as needed for cough. 02/02/17   Lawyer, Cristal Deer, PA-C  terbinafine (LAMISIL AT) 1 % cream Apply 1 application topically 2 (two) times daily. Patient not taking: Reported on 02/02/2017 01/29/17   Fayrene Helper, PA-C    ALLERGIES:  Allergies  Allergen Reactions  . Haldol [Haloperidol Lactate] Anaphylaxis    SOCIAL HISTORY:  Social History   Tobacco Use  . Smoking status: Current Every Day Smoker    Packs/day: 1.00    Years: 0.00    Pack years: 0.00    Types: Cigarettes, Cigars  . Smokeless tobacco: Never Used  Substance Use Topics  . Alcohol use: Yes    FAMILY HISTORY: No family history on file.  EXAM: BP (!) 140/95 (BP Location: Right Arm)   Pulse 68   Temp 97.6 F (36.4 C) (Oral)   Resp 16   SpO2 100%  CONSTITUTIONAL: Alert and oriented and responds appropriately to questions. Well-appearing; well-nourished, agitated, cursing at staff HEAD: Normocephalic, atraumatic EYES: Conjunctivae clear, pupils appear equal, EOMI ENT: normal  nose; moist mucous membranes NECK: Supple, no meningismus, no nuchal rigidity, no LAD  CARD: RRR; S1 and S2 appreciated; no murmurs, no clicks, no rubs, no gallops RESP: Normal chest excursion without splinting or tachypnea; breath sounds clear and equal bilaterally; no wheezes, no rhonchi, no rales, no hypoxia or respiratory distress, speaking full sentences ABD/GI: Normal bowel sounds; non-distended; soft, non-tender, no rebound, no guarding, no peritoneal signs, no hepatosplenomegaly BACK:  The back appears normal and is non-tender to palpation, there is no CVA  tenderness EXT: Normal ROM in all joints; non-tender to palpation; no edema; normal capillary refill; no cyanosis, no calf tenderness or swelling    SKIN: Normal color for age and race; warm; no rash NEURO: Moves all extremities equally, strength 5/5 in all 4 extremities, sensation to light touch intact diffusely, cranial nerves II through XII intact, normal speech, normal gait PSYCH: Agitated and cursing at staff.  MEDICAL DECISION MAKING: Patient here with complaints of headache.  No red flag symptoms on exam.  Doubt intracranial hemorrhage, meningitis, encephalitis, stroke.  I do not feel he needs emergent head imaging.  Vital signs here are normal.  He is neurologically intact and appears to be in no distress.  Have recommended outpatient management with over-the-counter Tylenol and ibuprofen.  I suspect patient is somewhat malingering given he is homeless and was asked to leave the waiting room and that is why he checked back in.  He is well-known to our emergency department and has been here 40 times in the past 6 months.  Given patient is agitated and cursing at staff he will be escorted out with security.  At this time, I do not feel there is any life-threatening condition present. I have reviewed and discussed all results (EKG, imaging, lab, urine as appropriate) and exam findings with patient/family. I have reviewed nursing notes and appropriate previous records.  I feel the patient is safe to be discharged home without further emergent workup and can continue workup as an outpatient as needed. Discussed usual and customary return precautions. Patient/family verbalize understanding and are comfortable with this plan.  Outpatient follow-up has been provided if needed. All questions have been answered.      Dustin Norris, Layla MawKristen N, DO 02/13/17 [phone]

## 2017-02-13 NOTE — ED Notes (Signed)
Bed: [facility]2 Expected date:  Expected time:  Means of arrival:  Comments: 

## 2017-02-13 NOTE — ED Triage Notes (Signed)
Patient here from lobby with complaints of headache. Patient was seen for same last night.

## 2017-02-13 NOTE — Discharge Instructions (Signed)
You may alternate Tylenol 1000 mg every 6 hours as needed for pain and Ibuprofen 800 mg every 8 hours as needed for pain.  Please take Ibuprofen with food. ° ° ° °To find a primary care or specialty doctor please call [phone] or [phone] to access "[facility] Find a Doctor Service." ° °You may also go on the [facility] website at [email or web address] ° °There are also multiple [facility], [facility] practices throughout the [place] that are frequently accepting new patients. You may find a clinic that is close to your home and contact them. ° °[facility] -  °[address] °[place] [address] °[phone] ° ° °[facility] -  °[address] °[place] [address] °[phone] ° ° °[facility] - °[address]  °[place] [address] °[phone] ° ° °

## 2017-02-15 ENCOUNTER — Emergency Department (HOSPITAL_COMMUNITY)
Admission: EM | Admit: 2017-02-15 | Discharge: 2017-02-16 | Disposition: A | Discharge status: Home / Self Care | Payer: Self-pay | Attending: Emergency Medicine | Admitting: Emergency Medicine

## 2017-02-15 ENCOUNTER — Encounter (HOSPITAL_COMMUNITY): Payer: Self-pay

## 2017-02-15 ENCOUNTER — Other Ambulatory Visit: Payer: Self-pay

## 2017-02-15 DIAGNOSIS — J45909 Unspecified asthma, uncomplicated: Secondary | ICD-10-CM | POA: Insufficient documentation

## 2017-02-15 DIAGNOSIS — Z5900 Homelessness unspecified: Secondary | ICD-10-CM

## 2017-02-15 DIAGNOSIS — R51 Headache: Secondary | ICD-10-CM | POA: Insufficient documentation

## 2017-02-15 DIAGNOSIS — F17210 Nicotine dependence, cigarettes, uncomplicated: Secondary | ICD-10-CM | POA: Insufficient documentation

## 2017-02-15 DIAGNOSIS — R519 Headache, unspecified: Secondary | ICD-10-CM

## 2017-02-15 MED ORDER — ACETAMINOPHEN 325 MG PO TABS: 650.0000 mg | ORAL_TABLET | Freq: Once | ORAL | Status: DC

## 2017-02-15 NOTE — ED Provider Notes (Signed)
[facility]   CSN: [id number] Arrival date & time: 02/15/17  2117     History   Chief Complaint Chief Complaint  Patient presents with  . Migraine    HPI Salvatore MarvelLeonard Serafin is a 42 y.o. male.   The history is provided by the patient.   Migraine   He is homeless and has a long history of cocaine abuse, asthma, migraines, psychosis and comes in complaining of a bifrontal headache for the last week.  He describes the pain is throbbing.  Nothing makes it better, nothing makes it worse.  He has not taken anything for it.  He rates the pain at 8/10.  I did ask him if he was here primarily because he is homeless and it is cold outside, and he does admit that he is here at least partly because it is cold outside.  Past Medical History:  Diagnosis Date  . Anxiety   . Asthma   . Cocaine abuse (HCC)   . Homelessness   . Migraine   . Tobacco abuse     Patient Active Problem List   Diagnosis Date Noted  . 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 Cocaine use disorder, moderate, dependence (HCC) 07/26/2016  . 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 Psychosis (HCC) 07/24/2016  . Chest pain 11/23/2015  . Asthma 11/23/2015  . Tobacco abuse 11/23/2015  . Elevated lactic acid level 11/23/2015  . Cocaine abuse (HCC)   . Concern about STD in male without diagnosis   . Paranoid ideation (HCC) 02/22/2015    History reviewed. No pertinent surgical history.     Home Medications    Prior to Admission medications   Medication Sig Start Date End Date Taking? Authorizing Provider  acetaminophen (TYLENOL) 500 MG tablet Take 500 mg by mouth every 8 (eight) hours as needed for mild pain or headache.     [provider]  albuterol (PROVENTIL HFA;VENTOLIN HFA) 108 (90 Base) MCG/ACT inhaler Inhale 2 puffs into the lungs every 4 (four) hours as needed for wheezing or  shortness of breath. 02/03/17   Muthersbaugh, Dahlia ClientHannah, PA-C  diclofenac (VOLTAREN) 75 MG EC tablet Take 1 tablet (75 mg total) by mouth 2 (two) times daily. 02/09/17   Elson AreasSofia, Leslie K, PA-C  fluticasone (FLONASE) 50 MCG/ACT nasal spray Place 2 sprays into both nostrils daily. 02/03/17   Muthersbaugh, Dahlia ClientHannah, PA-C  guaiFENesin-codeine 100-10 MG/5ML syrup Take 5 mLs by mouth 3 (three) times daily as needed for cough. 02/03/17   Janne NapoleonNeese, Hope M, NP  ibuprofen (ADVIL,MOTRIN) 800 MG tablet Take 1 tablet (800 mg total) by mouth every 8 (eight) hours as needed. Patient taking differently: Take 800 mg by mouth every 8 (eight) hours as needed (pain).  02/02/17   Lawyer, Cristal Deerhristopher, PA-C  predniSONE (DELTASONE) 10 MG tablet Take 2 tablets (20 mg total) by mouth daily. 02/03/17   Janne NapoleonNeese, Hope M, NP  promethazine-dextromethorphan (PROMETHAZINE-DM) 6.25-15 MG/5ML syrup Take 10 mLs by mouth 4 (four) times daily as needed for cough. 02/02/17   Lawyer, Cristal Deerhristopher, PA-C  terbinafine (LAMISIL AT) 1 % cream Apply 1 application topically 2 (two) times daily. Patient not taking: Reported on 02/02/2017 01/29/17   Fayrene Helperran, Bowie, PA-C    Family History No family history on file.  Social History Social History   Tobacco Use  . Smoking status: Current Every Day Smoker    Packs/day: 1.00    Years: 0.00  Pack years: 0.00    Types: Cigarettes, Cigars  . Smokeless tobacco: Never Used  Substance Use Topics  . Alcohol use: Yes  . Drug use: No    Comment: last use October 2017     Allergies   Haldol [haloperidol lactate]   Review of Systems Review of Systems  All other systems reviewed and are negative.    Physical Exam Updated Vital Signs BP (!) 147/104   Pulse 85   Temp 98.4 F (36.9 C) (Oral)   SpO2 99%   Physical Exam  Nursing note and vitals reviewed.  42 year old male, resting comfortably and in no acute distress. Vital signs are significant for hypertension. Oxygen saturation is 99%, which is normal. Head  is normocephalic and atraumatic. PERRLA, EOMI. Oropharynx is clear. Neck is nontender and supple without adenopathy or JVD. Back is nontender and there is no CVA tenderness. Lungs are clear without rales, wheezes, or rhonchi. Chest is nontender. Heart has regular rate and rhythm without murmur. Abdomen is soft, flat, nontender without masses or hepatosplenomegaly and peristalsis is normoactive. Extremities have no cyanosis or edema, full range of motion is present. Skin is warm and dry without rash. Neurologic: Mental status is normal, cranial nerves are intact, there are no motor or sensory deficits.  ED Treatments / Results   Procedures Procedures (including critical care time)  Medications Ordered in ED Medications  acetaminophen (TYLENOL) tablet 650 mg (not administered)     Initial Impression / Assessment and Plan / ED Course  I have reviewed the triage vital signs and the nursing notes.  Pertinent labs & imaging results that were available during my care of the patient were reviewed by me and considered in my medical decision making (see chart for details).  Bifrontal headache in a patient who is in no distress whatsoever.  He had to be awakened in order to get history.  No physical findings to suggest serious causes of headache.  Old records are reviewed, and he has 40 ED visits in the last 6 months, and this is his 15th ED visit this month.  Headache has been treated in prior visits with acetaminophen or ibuprofen.  He will be given acetaminophen.  I suspect that his main reason for coming is to get a warm place to sleep.  Following above-noted treatment, patient is sleeping comfortably.  My suspicion is that he is here entirely to get out of the cold.  He is discharged with instructions to take over-the-counter analgesics as needed for pain.  Given financial resources and list of shelters.  Final Clinical Impressions(s) / ED Diagnoses   Final diagnoses:  Headache,  unspecified headache type  Homeless    ED Discharge Orders    None      Dione Booze, MD 02/16/17 [phone]

## 2017-02-15 NOTE — Discharge Instructions (Signed)
Take acetaminophen or ibuprofen as needed for headache

## 2017-02-15 NOTE — ED Notes (Signed)
Pt reports h/a with photosensitivity.  He also admits to being here to get out of the cold tonight.  As soon as pt was brought to the bed in the hallway, pt laid down and started snoring.

## 2017-02-15 NOTE — ED Triage Notes (Signed)
Pt here with c/o of migraine that has been going on for a week with photosensitivity. Seen for same several times this week

## 2017-02-16 NOTE — ED Notes (Signed)
Pt is resting with eyes closed.  Snoring heard.  Rise and fall of chest noted.

## 2017-02-17 ENCOUNTER — Emergency Department (HOSPITAL_COMMUNITY)
Admission: EM | Admit: 2017-02-17 | Discharge: 2017-02-18 | Disposition: A | Discharge status: Home / Self Care | Payer: Self-pay | Attending: Emergency Medicine | Admitting: Emergency Medicine

## 2017-02-17 ENCOUNTER — Encounter (HOSPITAL_COMMUNITY): Payer: Self-pay | Admitting: Emergency Medicine

## 2017-02-17 DIAGNOSIS — F17210 Nicotine dependence, cigarettes, uncomplicated: Secondary | ICD-10-CM | POA: Insufficient documentation

## 2017-02-17 DIAGNOSIS — J45909 Unspecified asthma, uncomplicated: Secondary | ICD-10-CM | POA: Insufficient documentation

## 2017-02-17 DIAGNOSIS — R51 Headache: Secondary | ICD-10-CM | POA: Insufficient documentation

## 2017-02-17 DIAGNOSIS — Z590 Homelessness: Secondary | ICD-10-CM | POA: Insufficient documentation

## 2017-02-17 DIAGNOSIS — Z79899 Other long term (current) drug therapy: Secondary | ICD-10-CM | POA: Insufficient documentation

## 2017-02-17 DIAGNOSIS — G8929 Other chronic pain: Secondary | ICD-10-CM

## 2017-02-17 NOTE — ED Notes (Signed)
Called to triage, no answer

## 2017-02-17 NOTE — ED Triage Notes (Signed)
Pt reports HA onset this AM, has hx of seen. Seen here frequently for same d/t homelessness.

## 2017-02-18 ENCOUNTER — Encounter (HOSPITAL_COMMUNITY): Payer: Self-pay | Admitting: Emergency Medicine

## 2017-02-18 ENCOUNTER — Emergency Department (HOSPITAL_COMMUNITY)
Admission: EM | Admit: 2017-02-18 | Discharge: 2017-02-19 | Disposition: A | Discharge status: Home / Self Care | Payer: Self-pay | Attending: Emergency Medicine | Admitting: Emergency Medicine

## 2017-02-18 ENCOUNTER — Other Ambulatory Visit: Payer: Self-pay

## 2017-02-18 DIAGNOSIS — R519 Headache, unspecified: Secondary | ICD-10-CM

## 2017-02-18 DIAGNOSIS — J45909 Unspecified asthma, uncomplicated: Secondary | ICD-10-CM | POA: Insufficient documentation

## 2017-02-18 DIAGNOSIS — R51 Headache: Secondary | ICD-10-CM | POA: Insufficient documentation

## 2017-02-18 DIAGNOSIS — F17210 Nicotine dependence, cigarettes, uncomplicated: Secondary | ICD-10-CM | POA: Insufficient documentation

## 2017-02-18 MED ORDER — METHYLPREDNISOLONE SODIUM SUCC 125 MG IJ SOLR: 125.0000 mg | Freq: Once | INTRAMUSCULAR | Status: DC

## 2017-02-18 MED ORDER — SODIUM CHLORIDE 0.9 % IV BOLUS (SEPSIS): 1000.0000 mL | Freq: Once | INTRAVENOUS | Status: DC

## 2017-02-18 MED ORDER — IBUPROFEN 800 MG PO TABS
800.0000 mg | ORAL_TABLET | Freq: Once | ORAL | Status: AC
  Administered 2017-02-18: 800 mg via ORAL
  Filled 2017-02-18: qty 1

## 2017-02-18 MED ORDER — IPRATROPIUM-ALBUTEROL 0.5-2.5 (3) MG/3ML IN SOLN: 3.0000 mL | Freq: Once | RESPIRATORY_TRACT | Status: DC

## 2017-02-18 NOTE — ED Provider Notes (Signed)
TIME SEEN: 1:12 AM  CHIEF COMPLAINT: headache  HPI: Patient is a 42 year old male with history of anxiety, asthma, substance abuse, chronic headaches, homelessness who is well-known to our emergency department with 42 visits in the past 6 months who presents today with complaints of generalized throbbing headache.  Headache is been ongoing for several weeks.  He denies aggravating or relieving factors.  No head injury.  Not on antiplatelets or anticoagulants.  No nausea or vomiting.  No sensitivity to light or sound.  Has had similar headaches before.  Denies that it was sudden onset, thunderclap.  No numbness, tingling or focal weakness.  No fever.  No neck pain or neck stiffness.  ROS: See HPI Constitutional: no fever  Eyes: no drainage  ENT: no runny nose   Cardiovascular:  no chest pain  Resp: no SOB  GI: no vomiting GU: no dysuria Integumentary: no rash  Allergy: no hives  Musculoskeletal: no leg swelling  Neurological: no slurred speech ROS otherwise negative  PAST MEDICAL HISTORY/PAST SURGICAL HISTORY:  Past Medical History:  Diagnosis Date  . Anxiety   . Asthma   . Cocaine abuse (HCC)   . Homelessness   . Migraine   . Tobacco abuse     MEDICATIONS:  Prior to Admission medications   Medication Sig Start Date End Date Taking? Authorizing Provider  acetaminophen (TYLENOL) 500 MG tablet Take 500 mg by mouth every 8 (eight) hours as needed for mild pain or headache.     [provider]  albuterol (PROVENTIL HFA;VENTOLIN HFA) 108 (90 Base) MCG/ACT inhaler Inhale 2 puffs into the lungs every 4 (four) hours as needed for wheezing or shortness of breath. 02/03/17   Muthersbaugh, Dahlia ClientHannah, PA-C  diclofenac (VOLTAREN) 75 MG EC tablet Take 1 tablet (75 mg total) by mouth 2 (two) times daily. 02/09/17   Elson AreasSofia, Leslie K, PA-C  fluticasone (FLONASE) 50 MCG/ACT nasal spray Place 2 sprays into both nostrils daily. 02/03/17   Muthersbaugh, Dahlia ClientHannah, PA-C  guaiFENesin-codeine 100-10 MG/5ML  syrup Take 5 mLs by mouth 3 (three) times daily as needed for cough. 02/03/17   Janne NapoleonNeese, Hope M, NP  ibuprofen (ADVIL,MOTRIN) 800 MG tablet Take 1 tablet (800 mg total) by mouth every 8 (eight) hours as needed. Patient taking differently: Take 800 mg by mouth every 8 (eight) hours as needed (pain).  02/02/17   Lawyer, Cristal Deerhristopher, PA-C  predniSONE (DELTASONE) 10 MG tablet Take 2 tablets (20 mg total) by mouth daily. 02/03/17   Janne NapoleonNeese, Hope M, NP  promethazine-dextromethorphan (PROMETHAZINE-DM) 6.25-15 MG/5ML syrup Take 10 mLs by mouth 4 (four) times daily as needed for cough. 02/02/17   Lawyer, Cristal Deerhristopher, PA-C  terbinafine (LAMISIL AT) 1 % cream Apply 1 application topically 2 (two) times daily. Patient not taking: Reported on 02/02/2017 01/29/17   Fayrene Helperran, Bowie, PA-C    ALLERGIES:  Allergies  Allergen Reactions  . Haldol [Haloperidol Lactate] Anaphylaxis    SOCIAL HISTORY:  Social History   Tobacco Use  . Smoking status: Current Every Day Smoker    Packs/day: 1.00    Years: 0.00    Pack years: 0.00    Types: Cigarettes, Cigars  . Smokeless tobacco: Never Used  Substance Use Topics  . Alcohol use: Yes    FAMILY HISTORY: No family history on file.  EXAM: BP (!) 149/98 (BP Location: Right Arm)   Pulse 68   Temp 98.2 F (36.8 C) (Oral)   Resp 18   Ht 5\' 7"  (1.702 m)   Wt 72.6  kg (160 lb)   SpO2 99%   BMI 25.06 kg/m  CONSTITUTIONAL: Alert and oriented and responds appropriately to questions. Well-appearing; well-nourished HEAD: Normocephalic EYES: Conjunctivae clear, pupils appear equal, EOMI ENT: normal nose; moist mucous membranes NECK: Supple, no meningismus, no nuchal rigidity, no LAD  CARD: RRR; S1 and S2 appreciated; no murmurs, no clicks, no rubs, no gallops RESP: Normal chest excursion without splinting or tachypnea; breath sounds clear and equal bilaterally; no wheezes, no rhonchi, no rales, no hypoxia or respiratory distress, speaking full sentences ABD/GI: Normal bowel  sounds; non-distended; soft, non-tender, no rebound, no guarding, no peritoneal signs, no hepatosplenomegaly BACK:  The back appears normal and is non-tender to palpation, there is no CVA tenderness EXT: Normal ROM in all joints; non-tender to palpation; no edema; normal capillary refill; no cyanosis, no calf tenderness or swelling    SKIN: Normal color for age and race; warm; no rash NEURO: Moves all extremities equally; Normal sensation diffusely, cranial nerves II through XII intact, normal speech, normal gait PSYCH: The patient's mood and manner are appropriate. Grooming and personal hygiene are appropriate.  MEDICAL DECISION MAKING: Patient here with typical HA.  Patient has a history of the same.  Denies neurologic deficits, fever, neck pain or neck stiffness, head injury.  Neurologically intact here, afebrile, well-appearing.  Have offered him IV and IM medications but he declined stating he will take oral medication.  Will give ibuprofen.  I do not feel he needs emergent head imaging.  I feel he is safe to be discharged.   At this time, I do not feel there is any life-threatening condition present. I have reviewed and discussed all results (EKG, imaging, lab, urine as appropriate) and exam findings with patient/family. I have reviewed nursing notes and appropriate previous records.  I feel the patient is safe to be discharged home without further emergent workup and can continue workup as an outpatient as needed. Discussed usual and customary return precautions. Patient/family verbalize understanding and are comfortable with this plan.  Outpatient follow-up has been provided if needed. All questions have been answered.      Francisca Langenderfer, Layla Maw, DO 02/18/17 Earle Gell

## 2017-02-18 NOTE — ED Triage Notes (Signed)
Pt reports headache ongoing since seen this morning, thinks its from his eye glasses.

## 2017-02-18 NOTE — Discharge Instructions (Signed)
You may alternate Tylenol 1000 mg every 6 hours as needed for pain and Ibuprofen 800 mg every 8 hours as needed for pain.  Please take Ibuprofen with food. ° °

## 2017-02-18 NOTE — ED Notes (Signed)
Patient given Sprite / 2 [place] sandwich bag . No bus pass available for pt.

## 2017-02-19 ENCOUNTER — Encounter (HOSPITAL_COMMUNITY): Payer: Self-pay | Admitting: *Deleted

## 2017-02-19 ENCOUNTER — Emergency Department (HOSPITAL_COMMUNITY)
Admission: EM | Admit: 2017-02-19 | Discharge: 2017-02-19 | Disposition: A | Discharge status: Home / Self Care | Payer: Self-pay | Attending: Emergency Medicine | Admitting: Emergency Medicine

## 2017-02-19 ENCOUNTER — Other Ambulatory Visit: Payer: Self-pay

## 2017-02-19 ENCOUNTER — Encounter (HOSPITAL_COMMUNITY): Payer: Self-pay | Admitting: Emergency Medicine

## 2017-02-19 DIAGNOSIS — Z5900 Homelessness unspecified: Secondary | ICD-10-CM

## 2017-02-19 DIAGNOSIS — R109 Unspecified abdominal pain: Secondary | ICD-10-CM | POA: Insufficient documentation

## 2017-02-19 DIAGNOSIS — Z5321 Procedure and treatment not carried out due to patient leaving prior to being seen by health care provider: Secondary | ICD-10-CM | POA: Insufficient documentation

## 2017-02-19 DIAGNOSIS — R51 Headache: Secondary | ICD-10-CM | POA: Insufficient documentation

## 2017-02-19 DIAGNOSIS — J322 Chronic ethmoidal sinusitis: Secondary | ICD-10-CM | POA: Insufficient documentation

## 2017-02-19 DIAGNOSIS — F17210 Nicotine dependence, cigarettes, uncomplicated: Secondary | ICD-10-CM | POA: Insufficient documentation

## 2017-02-19 DIAGNOSIS — Z79899 Other long term (current) drug therapy: Secondary | ICD-10-CM | POA: Insufficient documentation

## 2017-02-19 DIAGNOSIS — J45909 Unspecified asthma, uncomplicated: Secondary | ICD-10-CM | POA: Insufficient documentation

## 2017-02-19 DIAGNOSIS — R1084 Generalized abdominal pain: Secondary | ICD-10-CM | POA: Insufficient documentation

## 2017-02-19 MED ORDER — IBUPROFEN 200 MG PO TABS
600.0000 mg | ORAL_TABLET | Freq: Once | ORAL | Status: AC
  Administered 2017-02-19: 600 mg via ORAL
  Filled 2017-02-19: qty 1

## 2017-02-19 MED ORDER — GI COCKTAIL ~~LOC~~
30.0000 mL | Freq: Once | ORAL | Status: AC
  Administered 2017-02-19: 30 mL via ORAL
  Filled 2017-02-19: qty 30

## 2017-02-19 NOTE — ED Notes (Signed)
Patient refused blood work he stated he had it done yesterday

## 2017-02-19 NOTE — Discharge Instructions (Signed)
Return ot the Emergency Department for any worsening headache, fever, neck pain ,difficulty walking, numbness/weakness of your arms or legs.

## 2017-02-19 NOTE — ED Triage Notes (Signed)
Pt c/o continuing HA and abd pain.  Pt d/c for same @ 2 hours ago.  Pt states he needs tylenol and rest.  Denies n/v/d.

## 2017-02-19 NOTE — ED Provider Notes (Signed)
[facility] EMERGENCY DEPARTMENT Provider Note   CSN: [id number] Arrival date & time: 02/19/17  0123     History   Chief Complaint No chief complaint on file.   HPI Dustin Norris is a 42 y.o. male.  Patient returns again to the ER tonight.  He was seen earlier today.  He states that he wants somewhere to rest.  This is his chief complaint.  When questioned further, he states that he also has a headache and stomach ache.  He asks for a Tylenol.  He denies CP, SOB, fever, chills, n/v/d.  He is well known to this ED.   The history is provided by the patient. No language interpreter was used.    Past Medical History:  Diagnosis Date  . Anxiety   . Asthma   . Cocaine abuse (HCC)   . Homelessness   . Migraine   . Tobacco abuse     Patient Active Problem List   Diagnosis Date Noted  . 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 Cocaine use disorder, moderate, dependence (HCC) 07/26/2016  . 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 Psychosis (HCC) 07/24/2016  . Chest pain 11/23/2015  . Asthma 11/23/2015  . Tobacco abuse 11/23/2015  . Elevated lactic acid level 11/23/2015  . Cocaine abuse (HCC)   . Concern about STD in male without diagnosis   . Paranoid ideation (HCC) 02/22/2015    No past surgical history on file.     Home Medications    Prior to Admission medications   Medication Sig Start Date End Date Taking? Authorizing Provider  acetaminophen (TYLENOL) 500 MG tablet Take 500 mg by mouth every 8 (eight) hours as needed for mild pain or headache.     [provider]  albuterol (PROVENTIL HFA;VENTOLIN HFA) 108 (90 Base) MCG/ACT inhaler Inhale 2 puffs into the lungs every 4 (four) hours as needed for wheezing or shortness of breath. 02/03/17   Muthersbaugh, Dahlia Client, PA-C  diclofenac (VOLTAREN) 75 MG EC tablet Take 1 tablet (75 mg total) by mouth 2 (two) times daily. 02/09/17   Elson Areas, PA-C  fluticasone (FLONASE) 50 MCG/ACT nasal spray Place 2 sprays into both nostrils daily. 02/03/17   Muthersbaugh, Dahlia Client, PA-C  guaiFENesin-codeine 100-10 MG/5ML syrup Take 5 mLs by mouth 3 (three) times daily as needed for cough. 02/03/17   Janne Napoleon, NP  ibuprofen (ADVIL,MOTRIN) 800 MG tablet Take 1 tablet (800 mg total) by mouth every 8 (eight) hours as needed. Patient taking differently: Take 800 mg by mouth every 8 (eight) hours as needed (pain).  02/02/17   Lawyer, Cristal Deer, PA-C  predniSONE (DELTASONE) 10 MG tablet Take 2 tablets (20 mg total) by mouth daily. 02/03/17   Janne Napoleon, NP  promethazine-dextromethorphan (PROMETHAZINE-DM) 6.25-15 MG/5ML syrup Take 10 mLs by mouth 4 (four) times daily as needed for cough. 02/02/17   Lawyer, Cristal Deer, PA-C  terbinafine (LAMISIL AT) 1 % cream Apply 1 application topically 2 (two) times daily. Patient not taking: Reported on 02/02/2017 01/29/17   Fayrene Helper, PA-C    Family History No family history on file.  Social History Social History   Tobacco Use  . Smoking status: Current Every Day Smoker    Packs/day: 1.00    Years: 0.00    Pack years: 0.00    Types: Cigarettes, Cigars  . Smokeless tobacco: Never Used  Substance Use Topics  . Alcohol use: Yes  . Drug use: No  Comment: last use October 2017     Allergies   Haldol [haloperidol lactate]   Review of Systems Review of Systems  All other systems reviewed and are negative.    Physical Exam Updated Vital Signs There were no vitals taken for this visit.  Physical Exam  Constitutional: He is oriented to person, place, and time. No distress.  HENT:  Head: Normocephalic and atraumatic.  Eyes: Conjunctivae and EOM are normal. Pupils are equal, round, and reactive to light.  Neck: No tracheal deviation present.  Cardiovascular: Normal rate and regular rhythm.  Pulmonary/Chest: Effort normal. No respiratory distress.  Abdominal: Soft.  No focal abdominal  tenderness, no RLQ tenderness or pain at McBurney's point, no RUQ tenderness or Murphy's sign, no left-sided abdominal tenderness, no fluid wave, or signs of peritonitis   Musculoskeletal: Normal range of motion.  Neurological: He is alert and oriented to person, place, and time.  CN 3-12 intact Moves all extremities No ataxia  Skin: Skin is warm and dry. He is not diaphoretic.  Psychiatric: Judgment normal.  Nursing note and vitals reviewed.    ED Treatments / Results  Labs (all labs ordered are listed, but only abnormal results are displayed) Labs Reviewed - No data to display  EKG  EKG Interpretation None       Radiology No results found.  Procedures Procedures (including critical care time)  Medications Ordered in ED Medications - No data to display   Initial Impression / Assessment and Plan / ED Course  I have reviewed the triage vital signs and the nursing notes.  Pertinent labs & imaging results that were available during my care of the patient were reviewed by me and considered in my medical decision making (see chart for details).     Patient is homeless.  Seen earlier tonight.  Asks if he can have somewhere to rest.  Discussed that this is inappropriate use of emergency facilities, but patient was invited to wait in the waiting room until morning.  VSS.  NAD.  Well appearing.  Low suspicion for emergent condition.  Final Clinical Impressions(s) / ED Diagnoses   Final diagnoses:  Homelessness    ED Discharge Orders    None       Roxy HorsemanBrowning, Ranier Coach, PA-C 02/19/17 0248    Nira Connardama, Pedro Eduardo, MD 02/24/17 [phone]

## 2017-02-19 NOTE — ED Notes (Signed)
Pt c/o headaches and abdominal pain onset around 10:00.

## 2017-02-19 NOTE — ED Triage Notes (Signed)
Patient complaining of abdominal pain and headache. Patient states his headache is throbbing. He states his abdominal pain is throbbing also. Patient is falling asleep while trying to triage patient.

## 2017-02-19 NOTE — ED Triage Notes (Addendum)
Has returned with same type of headache and abd pain he always has. Verified by him pain in no different than when he comes in with dame complaints any other time. He just left [facility] earlier this morning then come here.

## 2017-02-19 NOTE — ED Notes (Signed)
Called pt multiple times to be seen, pt seen walking away form facility by Clinical research associatewriter and tech, removing pt from system.

## 2017-02-19 NOTE — ED Provider Notes (Signed)
[facility] EMERGENCY DEPARTMENT Provider Note   CSN: [id number] Arrival date & time: 02/18/17  2015     History   Chief Complaint Chief Complaint  Patient presents with  . Headache    HPI Dustin Norris is a 42 y.o. male Past medical history of anxiety, asthma, substance abuse, chronic headaches who presents for evaluation of generalized headache that began today.  Patient describes headache as a "throbbing sensation."  He states that he has taken over-the-counter Tylenol without any improvement in symptoms.  He denies any preceding trauma, fall, injury.  He has been able to ambulate without any difficulty.  No other alleviating or aggravating factors.  Patient states that he gets frequent headaches and that this feels similar to his previous headaches.  No changes in symptoms.  Patient denies any blood thinner use.  Patient denies any numbness/weakness of his arms or legs, CP, SOB, difficulty ambulating, vision changes, nausea/vomiting phobia, fever, neck pain.  The history is provided by the patient.    Past Medical History:  Diagnosis Date  . Anxiety   . Asthma   . Cocaine abuse (HCC)   . Homelessness   . Migraine   . Tobacco abuse     Patient Active Problem List   Diagnosis Date Noted  . 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 Cocaine use disorder, moderate, dependence (HCC) 07/26/2016  . 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 Psychosis (HCC) 07/24/2016  . Chest pain 11/23/2015  . Asthma 11/23/2015  . Tobacco abuse 11/23/2015  . Elevated lactic acid level 11/23/2015  . Cocaine abuse (HCC)   . Concern about STD in male without diagnosis   . Paranoid ideation (HCC) 02/22/2015    History reviewed. No pertinent surgical history.     Home Medications    Prior to Admission medications   Medication Sig Start Date End Date Taking? Authorizing Provider  acetaminophen (TYLENOL) 500 MG  tablet Take 500 mg by mouth every 8 (eight) hours as needed for mild pain or headache.     [provider]  albuterol (PROVENTIL HFA;VENTOLIN HFA) 108 (90 Base) MCG/ACT inhaler Inhale 2 puffs into the lungs every 4 (four) hours as needed for wheezing or shortness of breath. 02/03/17   Muthersbaugh, Dahlia Client, PA-C  diclofenac (VOLTAREN) 75 MG EC tablet Take 1 tablet (75 mg total) by mouth 2 (two) times daily. 02/09/17   Elson Areas, PA-C  fluticasone (FLONASE) 50 MCG/ACT nasal spray Place 2 sprays into both nostrils daily. 02/03/17   Muthersbaugh, Dahlia Client, PA-C  guaiFENesin-codeine 100-10 MG/5ML syrup Take 5 mLs by mouth 3 (three) times daily as needed for cough. 02/03/17   Janne Napoleon, NP  ibuprofen (ADVIL,MOTRIN) 800 MG tablet Take 1 tablet (800 mg total) by mouth every 8 (eight) hours as needed. Patient taking differently: Take 800 mg by mouth every 8 (eight) hours as needed (pain).  02/02/17   Lawyer, Cristal Deer, PA-C  predniSONE (DELTASONE) 10 MG tablet Take 2 tablets (20 mg total) by mouth daily. 02/03/17   Janne Napoleon, NP  promethazine-dextromethorphan (PROMETHAZINE-DM) 6.25-15 MG/5ML syrup Take 10 mLs by mouth 4 (four) times daily as needed for cough. 02/02/17   Lawyer, Cristal Deer, PA-C  terbinafine (LAMISIL AT) 1 % cream Apply 1 application topically 2 (two) times daily. Patient not taking: Reported on 02/02/2017 01/29/17   Fayrene Helper, PA-C    Family History History reviewed. No pertinent family history.  Social History Social History   Tobacco  Use  . Smoking status: Current Every Day Smoker    Packs/day: 1.00    Years: 0.00    Pack years: 0.00    Types: Cigarettes, Cigars  . Smokeless tobacco: Never Used  Substance Use Topics  . Alcohol use: Yes  . Drug use: No    Comment: last use October 2017     Allergies   Haldol [haloperidol lactate]   Review of Systems Review of Systems  Constitutional: Negative for fever.  Eyes: Negative for photophobia and visual disturbance.   Respiratory: Negative for cough and shortness of breath.   Cardiovascular: Negative for chest pain.  Gastrointestinal: Negative for abdominal pain, nausea and vomiting.  Genitourinary: Negative for dysuria and hematuria.  Musculoskeletal: Negative for neck pain.  Neurological: Positive for headaches. Negative for weakness and numbness.     Physical Exam Updated Vital Signs BP 134/79 (BP Location: Right Arm)   Pulse (!) 104   Temp 98.3 F (36.8 C) (Oral)   Resp 16   Ht 5\' 7"  (1.702 m)   Wt 73 kg (161 lb)   SpO2 100%   BMI 25.22 kg/m   Physical Exam  Constitutional: He is oriented to person, place, and time. He appears well-developed and well-nourished.  HENT:  Head: Normocephalic and atraumatic.  Mouth/Throat: Oropharynx is clear and moist and mucous membranes are normal.  Patient with hat on and will not let me take the hat off for examination. No tenderness to palpation of the skull. No deformity or crepitus noted.   Eyes: Conjunctivae, EOM and lids are normal. Pupils are equal, round, and reactive to light.  Neck: Full passive range of motion without pain. Neck supple.  Full flexion/extension and lateral movement of neck fully intact. No bony midline tenderness. No deformities or crepitus. Neck is supple.   Cardiovascular: Normal rate, regular rhythm, normal heart sounds and normal pulses. Exam reveals no gallop and no friction rub.  No murmur heard. Pulmonary/Chest: Effort normal and breath sounds normal.  Abdominal: Soft. Normal appearance. There is no tenderness. There is no rigidity and no guarding.  Musculoskeletal: Normal range of motion.  Neurological: He is alert and oriented to person, place, and time. GCS eye subscore is 4. GCS verbal subscore is 5. GCS motor subscore is 6.  Cranial nerves III-XII intact Follows commands, Moves all extremities  5/5 strength to BUE and BLE  Sensation intact throughout all major nerve distributions Normal finger to nose. No  dysdiadochokinesia. No pronator drift. No gait abnormalities  No slurred speech. No facial droop.   Skin: Skin is warm and dry. Capillary refill takes less than 2 seconds.  Psychiatric: He has a normal mood and affect. His speech is normal.  Nursing note and vitals reviewed.    ED Treatments / Results  Labs (all labs ordered are listed, but only abnormal results are displayed) Labs Reviewed - No data to display  EKG  EKG Interpretation None       Radiology No results found.  Procedures Procedures (including critical care time)  Medications Ordered in ED Medications  ibuprofen (ADVIL,MOTRIN) tablet 600 mg (600 mg Oral Given 02/19/17 0024)     Initial Impression / Assessment and Plan / ED Course  I have reviewed the triage vital signs and the nursing notes.  Pertinent labs & imaging results that were available during my care of the patient were reviewed by me and considered in my medical decision making (see chart for details).     42 y.o. male past  medical history of anxiety, asthma, substance abuse who presents for evaluation of generalized headache that began today.  Seen last night in the emergency department for similar symptoms.  Patient is well-known to the emergency department.  He denies any changes from his normal headaches.  Denies any preceding trauma, injury, fall, blood thinner use.  Has taken Tylenol with minimal improvement. Patient is afebrile, non-toxic appearing, sitting comfortably on examination table. Vital signs reviewed and stable. No neuro deficits noted on exam. Neck is supple and nontender.  History/physical exam is not concerning for meningitis. History/physical exam are not concerning for CVA, ICH.  No indication for CT imaging at this time.  Suspect that symptoms are due to patient's chronic headache.  At the end of my examination, patient states, "I just need you to let me stay overnight because I'm homeless and its cold." Explained to patient that  he will not be able to stay in the department room after discharge but during ED course and evaluation, is more than welcome to stay lobby.  Offered IV or IM analgesics for evaluation of headache.  Patient declines medications.  He states that he prefers to have oral medications.  Will give 1 dose of ibuprofen.  Patient asking if he can leave and go back into the waiting room as he does not want to stay here anymore.  Patient is walking in the department without any difficulty.  No evidence of neuro deficits.  Patient stable for discharge at this time. Patient had ample opportunity for questions and discussion. All patient's questions were answered with full understanding. Strict return precautions discussed. Patient expresses understanding and agreement to plan.    Final Clinical Impressions(s) / ED Diagnoses   Final diagnoses:  Generalized headache    ED Discharge Orders    None       Maxwell Caul, PA-C 02/19/17 0120    Derwood Kaplan, MD 02/19/17 [phone]

## 2017-02-19 NOTE — ED Notes (Signed)
Called pt for room placement no response and saw pt walking away from the facility.

## 2017-02-19 NOTE — ED Notes (Signed)
Pt c/o headache has been seen several times

## 2017-02-20 ENCOUNTER — Emergency Department (HOSPITAL_COMMUNITY): Payer: Self-pay

## 2017-02-20 ENCOUNTER — Encounter (HOSPITAL_COMMUNITY): Payer: Self-pay | Admitting: Emergency Medicine

## 2017-02-20 ENCOUNTER — Emergency Department (HOSPITAL_COMMUNITY)
Admission: EM | Admit: 2017-02-20 | Discharge: 2017-02-20 | Disposition: A | Discharge status: Home / Self Care | Payer: Self-pay | Attending: Emergency Medicine | Admitting: Emergency Medicine

## 2017-02-20 ENCOUNTER — Emergency Department (HOSPITAL_COMMUNITY): Admission: EM | Admit: 2017-02-20 | Discharge: 2017-02-20 | Discharge status: Absent without leave | Payer: Self-pay

## 2017-02-20 DIAGNOSIS — R1084 Generalized abdominal pain: Secondary | ICD-10-CM

## 2017-02-20 DIAGNOSIS — Z590 Homelessness: Secondary | ICD-10-CM | POA: Insufficient documentation

## 2017-02-20 DIAGNOSIS — B353 Tinea pedis: Secondary | ICD-10-CM | POA: Insufficient documentation

## 2017-02-20 DIAGNOSIS — M79671 Pain in right foot: Secondary | ICD-10-CM | POA: Insufficient documentation

## 2017-02-20 DIAGNOSIS — M79672 Pain in left foot: Secondary | ICD-10-CM | POA: Insufficient documentation

## 2017-02-20 DIAGNOSIS — J322 Chronic ethmoidal sinusitis: Secondary | ICD-10-CM

## 2017-02-20 DIAGNOSIS — F17210 Nicotine dependence, cigarettes, uncomplicated: Secondary | ICD-10-CM | POA: Insufficient documentation

## 2017-02-20 LAB — CBC
HCT: 42.6 % (ref 39.0–52.0)
HEMOGLOBIN: 13.7 g/dL (ref 13.0–17.0)
MCH: 29 pg (ref 26.0–34.0)
MCHC: 32.2 g/dL (ref 30.0–36.0)
MCV: 90.3 fL (ref 78.0–100.0)
Platelets: 264 10*3/uL (ref 150–400)
RBC: 4.72 MIL/uL (ref 4.22–5.81)
RDW: 13.9 % (ref 11.5–15.5)
WBC: 5.4 10*3/uL (ref 4.0–10.5)

## 2017-02-20 LAB — COMPREHENSIVE METABOLIC PANEL
ALT: 14 U/L — ABNORMAL LOW (ref 17–63)
ANION GAP: 5 (ref 5–15)
AST: 14 U/L — ABNORMAL LOW (ref 15–41)
Albumin: 3.7 g/dL (ref 3.5–5.0)
Alkaline Phosphatase: 60 U/L (ref 38–126)
BUN: 12 mg/dL (ref 6–20)
CALCIUM: 8.7 mg/dL — AB (ref 8.9–10.3)
CHLORIDE: 106 mmol/L (ref 101–111)
CO2: 26 mmol/L (ref 22–32)
Creatinine, Ser: 0.86 mg/dL (ref 0.61–1.24)
GFR calc non Af Amer: >60 mL/min (ref >60)
Glucose, Bld: 132 mg/dL — ABNORMAL HIGH (ref 65–99)
Potassium: 3.8 mmol/L (ref 3.5–5.1)
SODIUM: 137 mmol/L (ref 135–145)
Total Bilirubin: 0.5 mg/dL (ref 0.3–1.2)
Total Protein: 6.2 g/dL — ABNORMAL LOW (ref 6.5–8.1)

## 2017-02-20 LAB — LIPASE, BLOOD: LIPASE: 35 U/L (ref 11–51)

## 2017-02-20 MED ORDER — TERBINAFINE HCL 1 % EX CREA: 1.0000 "application " | TOPICAL_CREAM | Freq: Two times a day (BID) | CUTANEOUS | 0 refills | Status: DC

## 2017-02-20 NOTE — ED Notes (Signed)
I provided pt with 3 pairs of socks and 1 clean pair to wear upon discharge.

## 2017-02-20 NOTE — ED Triage Notes (Signed)
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 Requesting cream for bilateral feet.

## 2017-02-20 NOTE — ED Provider Notes (Signed)
[facility]-EMERGENCY DEPT Provider Note   CSN: [id number] Arrival date & time: 02/19/17  2220     History   Chief Complaint Chief Complaint  Patient presents with  . Abdominal Pain  . Headache    HPI Dustin Norris is a 42 y.o. male.  Patient with frequent visits for headache.  Presents this time with complaint of headache and abdominal pain.  Last formally evaluated on 18 January.  Patient states the headache is generalized mostly on top of the head.  And down around the sides.  Abdominal pain is all over.  Denies any nausea or vomiting.  No diarrhea.  No fevers.  Patient appears in no acute distress.      Past Medical History:  Diagnosis Date  . Anxiety   . Asthma   . Cocaine abuse (HCC)   . Homelessness   . Migraine   . Tobacco abuse     Patient Active Problem List   Diagnosis Date Noted  . 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 Cocaine use disorder, moderate, dependence (HCC) 07/26/2016  . 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 Psychosis (HCC) 07/24/2016  . Chest pain 11/23/2015  . Asthma 11/23/2015  . Tobacco abuse 11/23/2015  . Elevated lactic acid level 11/23/2015  . Cocaine abuse (HCC)   . Concern about STD in male without diagnosis   . Paranoid ideation (HCC) 02/22/2015    History reviewed. No pertinent surgical history.     Home Medications    Prior to Admission medications   Medication Sig Start Date End Date Taking? Authorizing Provider  acetaminophen (TYLENOL) 500 MG tablet Take 500 mg by mouth every 8 (eight) hours as needed for mild pain or headache.     [provider]  albuterol (PROVENTIL HFA;VENTOLIN HFA) 108 (90 Base) MCG/ACT inhaler Inhale 2 puffs into the lungs every 4 (four) hours as needed for wheezing or shortness of breath. 02/03/17   Muthersbaugh, Dahlia Client, PA-C  diclofenac (VOLTAREN) 75 MG EC tablet Take 1 tablet (75 mg total) by mouth 2  (two) times daily. 02/09/17   Elson Areas, PA-C  fluticasone (FLONASE) 50 MCG/ACT nasal spray Place 2 sprays into both nostrils daily. 02/03/17   Muthersbaugh, Dahlia Client, PA-C  guaiFENesin-codeine 100-10 MG/5ML syrup Take 5 mLs by mouth 3 (three) times daily as needed for cough. 02/03/17   Janne Napoleon, NP  ibuprofen (ADVIL,MOTRIN) 800 MG tablet Take 1 tablet (800 mg total) by mouth every 8 (eight) hours as needed. Patient taking differently: Take 800 mg by mouth every 8 (eight) hours as needed (pain).  02/02/17   Lawyer, Cristal Deer, PA-C  predniSONE (DELTASONE) 10 MG tablet Take 2 tablets (20 mg total) by mouth daily. 02/03/17   Janne Napoleon, NP  promethazine-dextromethorphan (PROMETHAZINE-DM) 6.25-15 MG/5ML syrup Take 10 mLs by mouth 4 (four) times daily as needed for cough. 02/02/17   Lawyer, Cristal Deer, PA-C  terbinafine (LAMISIL AT) 1 % cream Apply 1 application topically 2 (two) times daily. Patient not taking: Reported on 02/02/2017 01/29/17   Fayrene Helper, PA-C    Family History History reviewed. No pertinent family history.  Social History Social History   Tobacco Use  . Smoking status: Current Every Day Smoker    Packs/day: 1.00    Years: 0.00    Pack years: 0.00    Types: Cigarettes, Cigars  . Smokeless tobacco: Never Used  Substance Use Topics  . Alcohol use: Yes  . Drug use: No  Comment: last use October 2017     Allergies   Haldol [haloperidol lactate]   Review of Systems Review of Systems  Constitutional: Negative for fever.  HENT: Negative for congestion.   Eyes: Negative for redness.  Respiratory: Negative for shortness of breath.   Cardiovascular: Negative for chest pain.  Gastrointestinal: Positive for abdominal pain. Negative for diarrhea, nausea and vomiting.  Genitourinary: Negative for dysuria.  Musculoskeletal: Negative for back pain.  Neurological: Positive for headaches. Negative for weakness and numbness.  Hematological: Does not bruise/bleed easily.    Psychiatric/Behavioral: Negative for confusion.     Physical Exam Updated Vital Signs BP 133/84 (BP Location: Left Arm)   Pulse 67   Temp 97.8 F (36.6 C) (Oral)   Resp 18   Ht 1.702 m (5\' 7" )   Wt 75.3 kg (166 lb 1.6 oz)   SpO2 96%   BMI 26.01 kg/m   Physical Exam  Constitutional: He is oriented to person, place, and time. He appears well-developed and well-nourished.  HENT:  Head: Normocephalic and atraumatic.  Mouth/Throat: Oropharynx is clear and moist.  Eyes: EOM are normal. Pupils are equal, round, and reactive to light.  Neck: Normal range of motion. Neck supple.  Cardiovascular: Normal rate and regular rhythm.  Pulmonary/Chest: Effort normal and breath sounds normal.  Abdominal: Soft. Normal appearance, normal aorta and bowel sounds are normal. There is no tenderness.  Musculoskeletal: Normal range of motion.  Neurological: He is alert and oriented to person, place, and time. No cranial nerve deficit or sensory deficit. He exhibits normal muscle tone. Coordination normal.  Skin: Skin is warm.  Nursing note and vitals reviewed.    ED Treatments / Results  Labs (all labs ordered are listed, but only abnormal results are displayed) Labs Reviewed  COMPREHENSIVE METABOLIC PANEL - Abnormal; Notable for the following components:      Result Value   Glucose, Bld 132 (*)    Calcium 8.7 (*)    Total Protein 6.2 (*)    AST 14 (*)    ALT 14 (*)    All other components within normal limits  LIPASE, BLOOD  CBC  URINALYSIS, ROUTINE W REFLEX MICROSCOPIC    EKG  EKG Interpretation None       Radiology Ct Head Wo Contrast  Result Date: 02/20/2017 CLINICAL DATA:  Chronic headache EXAM: CT HEAD WITHOUT CONTRAST TECHNIQUE: Contiguous axial images were obtained from the base of the skull through the vertex without intravenous contrast. COMPARISON:  April 10, 2016 FINDINGS: Brain: The ventricles are normal in size and configuration. There is no intracranial mass,  hemorrhage, extra-axial fluid collection, or midline shift. Gray-white compartments are normal. No acute infarct evident. Vascular: No hyperdense vessel. No vascular calcifications are evident. Skull: Bony calvarium appears intact. Sinuses/Orbits: There is mucosal thickening in multiple ethmoid air cells as well as in the visualized right maxillary antrum. Other visualized paranasal sinuses are clear. Orbits appear symmetric bilaterally. Other: Mastoid air cells are clear. IMPRESSION: Areas of paranasal sinus disease.  Study otherwise unremarkable. Electronically Signed   By: Bretta BangWilliam  Woodruff III M.D.   On: 02/20/2017 09:52    Procedures Procedures (including critical care time)  Medications Ordered in ED Medications - No data to display   Initial Impression / Assessment and Plan / ED Course  I have reviewed the triage vital signs and the nursing notes.  Pertinent labs & imaging results that were available during my care of the patient were reviewed by me and considered in  my medical decision making (see chart for details).     Workup here CT head was done because patient is never really had a CT of head despite multiple complaints for headache.  Does show evidence of chronic sinusitis probably explains his headaches.  Abdominal pain workup based on labs without any acute findings.  Patient's abdomen is soft no acute abdominal process.  Patient stable for discharge home.  Can take anti-inflammatory medicines like Motrin for the chronic sinus pain.  Patient given referral to wellness for follow-up.  Final Clinical Impressions(s) / ED Diagnoses   Final diagnoses:  Generalized abdominal pain  Chronic ethmoidal sinusitis    ED Discharge Orders    None       Vanetta Mulders, MD 02/20/17 1047

## 2017-02-20 NOTE — Discharge Instructions (Addendum)
Use medication as prescribed. Follow attached handout. Follow up with wellness center.

## 2017-02-20 NOTE — ED Provider Notes (Signed)
[facility]-EMERGENCY DEPT Provider Note   CSN: [id number] Arrival date & time: 02/20/17  1426     History   Chief Complaint Chief Complaint  Patient presents with  . Foot Pain    HPI Dustin Norris is a 42 y.o. male with a history of anxiety, asthma, homelessness who is well-known to the emergency department with 46 visits in the last 6 months presenting to the emergency department today for foot irritation.  Patient states that he has a history of athlete's foot.  He notes that he has not been able to change his socks as of late and has noticed swelling maceration between some of his toes.  He denies any injury to these areas.  He is able to move the joints without pain.  There is no associated fever or redness.  Patient has not tried any interventions for this.  He is requesting cream for tx.   HPI  Past Medical History:  Diagnosis Date  . Anxiety   . Asthma   . Cocaine abuse (HCC)   . Homelessness   . Migraine   . Tobacco abuse     Patient Active Problem List   Diagnosis Date Noted  . 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 Cocaine use disorder, moderate, dependence (HCC) 07/26/2016  . 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 Psychosis (HCC) 07/24/2016  . Chest pain 11/23/2015  . Asthma 11/23/2015  . Tobacco abuse 11/23/2015  . Elevated lactic acid level 11/23/2015  . Cocaine abuse (HCC)   . Concern about STD in male without diagnosis   . Paranoid ideation (HCC) 02/22/2015    History reviewed. No pertinent surgical history.     Home Medications    Prior to Admission medications   Medication Sig Start Date End Date Taking? Authorizing Provider  acetaminophen (TYLENOL) 500 MG tablet Take 500 mg by mouth every 8 (eight) hours as needed for mild pain or headache.     [provider]  albuterol (PROVENTIL HFA;VENTOLIN HFA) 108 (90 Base) MCG/ACT inhaler Inhale 2 puffs into  the lungs every 4 (four) hours as needed for wheezing or shortness of breath. 02/03/17   Muthersbaugh, Dahlia Client, PA-C  diclofenac (VOLTAREN) 75 MG EC tablet Take 1 tablet (75 mg total) by mouth 2 (two) times daily. 02/09/17   Elson Areas, PA-C  fluticasone (FLONASE) 50 MCG/ACT nasal spray Place 2 sprays into both nostrils daily. 02/03/17   Muthersbaugh, Dahlia Client, PA-C  guaiFENesin-codeine 100-10 MG/5ML syrup Take 5 mLs by mouth 3 (three) times daily as needed for cough. 02/03/17   Janne Napoleon, NP  ibuprofen (ADVIL,MOTRIN) 800 MG tablet Take 1 tablet (800 mg total) by mouth every 8 (eight) hours as needed. Patient taking differently: Take 800 mg by mouth every 8 (eight) hours as needed (pain).  02/02/17   Lawyer, Cristal Deer, PA-C  predniSONE (DELTASONE) 10 MG tablet Take 2 tablets (20 mg total) by mouth daily. 02/03/17   Janne Napoleon, NP  promethazine-dextromethorphan (PROMETHAZINE-DM) 6.25-15 MG/5ML syrup Take 10 mLs by mouth 4 (four) times daily as needed for cough. 02/02/17   Lawyer, Cristal Deer, PA-C  terbinafine (LAMISIL AT) 1 % cream Apply 1 application topically 2 (two) times daily. Patient not taking: Reported on 02/02/2017 01/29/17   Fayrene Helper, PA-C    Family History No family history on file.  Social History Social History   Tobacco Use  . Smoking status: Current Every Day Smoker    Packs/day: 1.00  Years: 0.00    Pack years: 0.00    Types: Cigarettes, Cigars  . Smokeless tobacco: Never Used  Substance Use Topics  . Alcohol use: Yes  . Drug use: No    Comment: last use October 2017     Allergies   Haldol [haloperidol lactate]   Review of Systems Review of Systems  All other systems reviewed and are negative.    Physical Exam Updated Vital Signs BP (!) 155/87 (BP Location: Right Arm)   Pulse 88   Temp 98.5 F (36.9 C) (Oral)   Resp 20   SpO2 99%   Physical Exam  Constitutional: He appears well-developed and well-nourished.  HENT:  Head: Normocephalic and  atraumatic.  Right Ear: External ear normal.  Left Ear: External ear normal.  Eyes: Conjunctivae are normal. Right eye exhibits no discharge. Left eye exhibits no discharge. No scleral icterus.  Cardiovascular:  Pulses:      Dorsalis pedis pulses are 2+ on the right side, and 2+ on the left side.       Posterior tibial pulses are 2+ on the right side, and 2+ on the left side.  No lower extremity swelling or edema.  Calves are nontender to palpation bilaterally.  Pulmonary/Chest: Effort normal. No respiratory distress.  Musculoskeletal:       Right ankle: Normal.       Left ankle: Normal.  Patient with interdigital tinea pedis with small maceration of the tissue.  There is no surrounding erythema, discharge, or streaking.  No changes of toenails.  Cap refill less than 2 seconds.  He is able to move all digits without pain.  There is no swelling over any of the digits.  There is large areas of callus on the dorsal heel of bilateral feet.  No evidence of ulceration or injury.  No tenderness palpation of the ankles, feet or toes. DP and PT pulses 2+.   Neurological: He is alert.  Skin: Skin is warm and dry. Capillary refill takes less than 2 seconds. No erythema. No pallor.  Psychiatric: He has a normal mood and affect.  Nursing note and vitals reviewed.    ED Treatments / Results  Labs (all labs ordered are listed, but only abnormal results are displayed) Labs Reviewed - No data to display  EKG  EKG Interpretation None       Radiology Ct Head Wo Contrast  Result Date: 02/20/2017 CLINICAL DATA:  Chronic headache EXAM: CT HEAD WITHOUT CONTRAST TECHNIQUE: Contiguous axial images were obtained from the base of the skull through the vertex without intravenous contrast. COMPARISON:  April 10, 2016 FINDINGS: Brain: The ventricles are normal in size and configuration. There is no intracranial mass, hemorrhage, extra-axial fluid collection, or midline shift. Gray-white compartments are  normal. No acute infarct evident. Vascular: No hyperdense vessel. No vascular calcifications are evident. Skull: Bony calvarium appears intact. Sinuses/Orbits: There is mucosal thickening in multiple ethmoid air cells as well as in the visualized right maxillary antrum. Other visualized paranasal sinuses are clear. Orbits appear symmetric bilaterally. Other: Mastoid air cells are clear. IMPRESSION: Areas of paranasal sinus disease.  Study otherwise unremarkable. Electronically Signed   By: Bretta Bang III M.D.   On: 02/20/2017 09:52    Procedures Procedures (including critical care time)  Medications Ordered in ED Medications - No data to display   Initial Impression / Assessment and Plan / ED Course  I have reviewed the triage vital signs and the nursing notes.  Pertinent labs & imaging  results that were available during my care of the patient were reviewed by me and considered in my medical decision making (see chart for details).     This is a 42 year old male who is well known to the ED presenting with tinea pedis. There does not appear to be overlying evidence of cellulitis. Do not suspect septic joint based on exam. No history of injury of bony ttp that would require xray. Will treat the patient with Lamisil and follow up with PCP. Information given for wellness center. Strict return precautions given. Patient appears safe for discharge.   Final Clinical Impressions(s) / ED Diagnoses   Final diagnoses:  Tinea pedis of both feet    ED Discharge Orders        Ordered    terbinafine (LAMISIL AT) 1 % cream  2 times daily     02/20/17 1946       Princella PellegriniMaczis, Myquan Schaumburg M, PA-C 02/20/17 Gwendalyn [id number]    Cook, Brian, MD 02/23/17 1013

## 2017-02-20 NOTE — Discharge Instructions (Signed)
Workup for the abdominal pain without any acute findings in the labs.  CT of head shows evidence of chronic sinusitis which may explain the persistent and recurrent headaches.  Follow-up as needed.  Referral information wellness clinic provided. Continue with the Motrin as needed for the headaches.

## 2017-02-28 DIAGNOSIS — R1013 Epigastric pain: Secondary | ICD-10-CM | POA: Insufficient documentation

## 2017-02-28 DIAGNOSIS — F17210 Nicotine dependence, cigarettes, uncomplicated: Secondary | ICD-10-CM | POA: Insufficient documentation

## 2017-02-28 DIAGNOSIS — J45909 Unspecified asthma, uncomplicated: Secondary | ICD-10-CM | POA: Insufficient documentation

## 2017-03-01 ENCOUNTER — Other Ambulatory Visit: Payer: Self-pay

## 2017-03-01 ENCOUNTER — Encounter (HOSPITAL_COMMUNITY): Payer: Self-pay

## 2017-03-01 ENCOUNTER — Emergency Department (HOSPITAL_COMMUNITY)
Admission: EM | Admit: 2017-03-01 | Discharge: 2017-03-01 | Disposition: A | Discharge status: Home / Self Care | Payer: Self-pay | Attending: Emergency Medicine | Admitting: Emergency Medicine

## 2017-03-01 DIAGNOSIS — R1013 Epigastric pain: Secondary | ICD-10-CM

## 2017-03-01 LAB — CBC
HEMATOCRIT: 43.2 % (ref 39.0–52.0)
HEMOGLOBIN: 14.1 g/dL (ref 13.0–17.0)
MCH: 28.7 pg (ref 26.0–34.0)
MCHC: 32.6 g/dL (ref 30.0–36.0)
MCV: 88 fL (ref 78.0–100.0)
Platelets: 291 10*3/uL (ref 150–400)
RBC: 4.91 MIL/uL (ref 4.22–5.81)
RDW: 13.2 % (ref 11.5–15.5)
WBC: 7 10*3/uL (ref 4.0–10.5)

## 2017-03-01 LAB — COMPREHENSIVE METABOLIC PANEL
ALT: 15 U/L — ABNORMAL LOW (ref 17–63)
ANION GAP: 7 (ref 5–15)
AST: 23 U/L (ref 15–41)
Albumin: 4.3 g/dL (ref 3.5–5.0)
Alkaline Phosphatase: 73 U/L (ref 38–126)
BUN: 12 mg/dL (ref 6–20)
CHLORIDE: 106 mmol/L (ref 101–111)
CO2: 27 mmol/L (ref 22–32)
Calcium: 9.2 mg/dL (ref 8.9–10.3)
Creatinine, Ser: 0.85 mg/dL (ref 0.61–1.24)
GFR calc Af Amer: >60 mL/min (ref >60)
GFR calc non Af Amer: >60 mL/min (ref >60)
Glucose, Bld: 143 mg/dL — ABNORMAL HIGH (ref 65–99)
POTASSIUM: 4.1 mmol/L (ref 3.5–5.1)
Sodium: 140 mmol/L (ref 135–145)
TOTAL PROTEIN: 7.3 g/dL (ref 6.5–8.1)
Total Bilirubin: 0.2 mg/dL — ABNORMAL LOW (ref 0.3–1.2)

## 2017-03-01 LAB — URINALYSIS, ROUTINE W REFLEX MICROSCOPIC
Bilirubin Urine: NEGATIVE
Glucose, UA: NEGATIVE mg/dL
Hgb urine dipstick: NEGATIVE
KETONES UR: NEGATIVE mg/dL
LEUKOCYTES UA: NEGATIVE
Nitrite: NEGATIVE
PH: 8 (ref 5.0–8.0)
Protein, ur: NEGATIVE mg/dL
SPECIFIC GRAVITY, URINE: 1.012 (ref 1.005–1.030)

## 2017-03-01 LAB — LIPASE, BLOOD: LIPASE: 28 U/L (ref 11–51)

## 2017-03-01 MED ORDER — PANTOPRAZOLE SODIUM 40 MG PO TBEC
40.0000 mg | DELAYED_RELEASE_TABLET | Freq: Once | ORAL | Status: AC
  Administered 2017-03-01: 40 mg via ORAL
  Filled 2017-03-01: qty 1

## 2017-03-01 MED ORDER — OMEPRAZOLE 20 MG PO CPDR: 20.0000 mg | DELAYED_RELEASE_CAPSULE | Freq: Every day | ORAL | 0 refills | Status: DC

## 2017-03-01 MED ORDER — GI COCKTAIL ~~LOC~~
30.0000 mL | Freq: Once | ORAL | Status: AC
  Administered 2017-03-01: 30 mL via ORAL
  Filled 2017-03-01: qty 30

## 2017-03-01 NOTE — ED Triage Notes (Signed)
Pt reports 10/10 abd pain. Pt denies n/v, diarrhea, and constipation. Pt A+OX4, NAD.

## 2017-03-01 NOTE — ED Notes (Signed)
I gave patient a cup of water to drank

## 2017-03-01 NOTE — Discharge Instructions (Signed)
He was seen today for abdominal pain.  Your workup is reassuring.  This may be related to reflux/gastritis.  Take omeprazole as needed.

## 2017-03-01 NOTE — ED Provider Notes (Signed)
[facility]-EMERGENCY DEPT Provider Note   CSN: [id number] Arrival date & time: 02/28/17  2203     History   Chief Complaint Chief Complaint  Patient presents with  . Abdominal Pain    HPI Dustin Norris is a 42 y.o. male.  HPI  This is a 42 year old male who presents with abdominal pain.  Patient is well-known to the emergency department with 47 visits over the last 6.  History of homelessness and cocaine abuse.  Patient reports 1 day history of worsening epigastric pain.  He reports that it is worse after eating.  He describes it as burning and 10 out of 10.  Nonradiating.  Denies any alcohol use.  He has not taken anything for his symptoms.  He denies any nausea, vomiting, constipation, diarrhea.  He denies any fevers or chest pain.  Past Medical History:  Diagnosis Date  . Anxiety   . Asthma   . Cocaine abuse (HCC)   . Homelessness   . Migraine   . Tobacco abuse     Patient Active Problem List   Diagnosis Date Noted  . 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 Cocaine use disorder, moderate, dependence (HCC) 07/26/2016  . 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 Psychosis (HCC) 07/24/2016  . Chest pain 11/23/2015  . Asthma 11/23/2015  . Tobacco abuse 11/23/2015  . Elevated lactic acid level 11/23/2015  . Cocaine abuse (HCC)   . Concern about STD in male without diagnosis   . Paranoid ideation (HCC) 02/22/2015    History reviewed. No pertinent surgical history.     Home Medications    Prior to Admission medications   Medication Sig Start Date End Date Taking? Authorizing Provider  albuterol (PROVENTIL HFA;VENTOLIN HFA) 108 (90 Base) MCG/ACT inhaler Inhale 2 puffs into the lungs every 4 (four) hours as needed for wheezing or shortness of breath. Patient not taking: Reported on 03/01/2017 02/03/17   Muthersbaugh, Dahlia Client, PA-C  diclofenac (VOLTAREN) 75 MG EC tablet Take 1 tablet  (75 mg total) by mouth 2 (two) times daily. Patient not taking: Reported on 03/01/2017 02/09/17   Elson Areas, PA-C  fluticasone [facility]) 50 MCG/ACT nasal spray Place 2 sprays into both nostrils daily. Patient not taking: Reported on 03/01/2017 02/03/17   Muthersbaugh, Dahlia Client, PA-C  guaiFENesin-codeine 100-10 MG/5ML syrup Take 5 mLs by mouth 3 (three) times daily as needed for cough. Patient not taking: Reported on 03/01/2017 02/03/17   Janne Napoleon, NP  ibuprofen (ADVIL,MOTRIN) 800 MG tablet Take 1 tablet (800 mg total) by mouth every 8 (eight) hours as needed. Patient not taking: Reported on 03/01/2017 02/02/17   Charlestine Night, PA-C  omeprazole (PRILOSEC) 20 MG capsule Take 1 capsule (20 mg total) by mouth daily. 03/01/17   Chidinma Clites, Mayer Masker, MD  predniSONE (DELTASONE) 10 MG tablet Take 2 tablets (20 mg total) by mouth daily. Patient not taking: Reported on 03/01/2017 02/03/17   Janne Napoleon, NP  promethazine-dextromethorphan (PROMETHAZINE-DM) 6.25-15 MG/5ML syrup Take 10 mLs by mouth 4 (four) times daily as needed for cough. Patient not taking: Reported on 03/01/2017 02/02/17   Charlestine Night, PA-C  terbinafine (LAMISIL AT) 1 % cream Apply 1 application topically 2 (two) times daily. Patient not taking: Reported on 03/01/2017 02/20/17   Maczis, Elmer Sow, PA-C  metoCLOPramide (REGLAN) 10 MG tablet Take 1 tablet (10 mg total) by mouth every 6 (six) hours as needed for nausea (or headache). Patient not taking: Reported on  04/11/2015 03/26/15 07/04/15  Dione Booze, MD    Family History History reviewed. No pertinent family history.  Social History Social History   Tobacco Use  . Smoking status: Current Every Day Smoker    Packs/day: 1.00    Years: 0.00    Pack years: 0.00    Types: Cigarettes, Cigars  . Smokeless tobacco: Never Used  Substance Use Topics  . Alcohol use: Yes  . Drug use: No    Comment: last use October 2017     Allergies   Haldol [haloperidol lactate]   Review of  Systems Review of Systems  Constitutional: Negative for fever.  Respiratory: Negative for shortness of breath.   Cardiovascular: Negative for chest pain.  Gastrointestinal: Positive for abdominal pain. Negative for constipation, nausea and vomiting.  Musculoskeletal: Negative for back pain.  All other systems reviewed and are negative.    Physical Exam Updated Vital Signs BP (!) 136/94 (BP Location: Right Arm)   Pulse 71   Temp 98.5 F (36.9 C) (Oral)   Resp 16   SpO2 100%   Physical Exam  Constitutional: He is oriented to person, place, and time. He appears well-developed and well-nourished. He does not appear ill.  HENT:  Head: Normocephalic and atraumatic.  Neck: Neck supple.  Cardiovascular: Normal rate, regular rhythm and normal heart sounds.  No murmur heard. Pulmonary/Chest: Effort normal and breath sounds normal. No respiratory distress. He has no wheezes.  Abdominal: Soft. Bowel sounds are normal. There is no tenderness. There is no rebound and no guarding.  Musculoskeletal: He exhibits no edema.  Lymphadenopathy:    He has no cervical adenopathy.  Neurological: He is alert and oriented to person, place, and time.  Skin: Skin is warm and dry.  Psychiatric: He has a normal mood and affect.  Nursing note and vitals reviewed.    ED Treatments / Results  Labs (all labs ordered are listed, but only abnormal results are displayed) Labs Reviewed  COMPREHENSIVE METABOLIC PANEL - Abnormal; Notable for the following components:      Result Value   Glucose, Bld 143 (*)    ALT 15 (*)    Total Bilirubin 0.2 (*)    All other components within normal limits  LIPASE, BLOOD  CBC  URINALYSIS, ROUTINE W REFLEX MICROSCOPIC    EKG  EKG Interpretation None       Radiology No results found.  Procedures Procedures (including critical care time)  Medications Ordered in ED Medications  gi cocktail (Maalox,Lidocaine,Donnatal) (not administered)  pantoprazole  (PROTONIX) EC tablet 40 mg (not administered)     Initial Impression / Assessment and Plan / ED Course  I have reviewed the triage vital signs and the nursing notes.  Pertinent labs & imaging results that were available during my care of the patient were reviewed by me and considered in my medical decision making (see chart for details).     Patient presents with abdominal pain.  Nontoxic on exam.  Vital signs are reassuring and within normal limits.  He has no reproducible tenderness on exam.  His lab work was reviewed with normal LFTs and normal lipase.  He denies any risk factors for gastritis.  Patient was given a GI cocktail and Protonix.  Suspect he may have some reflux.  Do not feel he needs further imaging or workup at this time.  After history, exam, and medical workup I feel the patient has been appropriately medically screened and is safe for discharge home. Pertinent diagnoses were  discussed with the patient. Patient was given return precautions.   Final Clinical Impressions(s) / ED Diagnoses   Final diagnoses:  Epigastric pain    ED Discharge Orders        Ordered    omeprazole (PRILOSEC) 20 MG capsule  Daily     03/01/17 0501       Shon BatonHorton, Jayleena Stille F, MD 03/01/17 [phone]

## 2017-03-02 ENCOUNTER — Encounter (HOSPITAL_COMMUNITY): Payer: Self-pay | Admitting: *Deleted

## 2017-03-02 ENCOUNTER — Emergency Department (HOSPITAL_COMMUNITY)
Admission: EM | Admit: 2017-03-02 | Discharge: 2017-03-02 | Disposition: A | Discharge status: Home / Self Care | Payer: Self-pay | Attending: Emergency Medicine | Admitting: Emergency Medicine

## 2017-03-02 ENCOUNTER — Other Ambulatory Visit: Payer: Self-pay

## 2017-03-02 DIAGNOSIS — R51 Headache: Secondary | ICD-10-CM | POA: Insufficient documentation

## 2017-03-02 DIAGNOSIS — Z5321 Procedure and treatment not carried out due to patient leaving prior to being seen by health care provider: Secondary | ICD-10-CM | POA: Insufficient documentation

## 2017-03-02 NOTE — ED Notes (Signed)
Pt called for times two/no answer

## 2017-03-02 NOTE — ED Triage Notes (Signed)
Pt reports having a headache for several days, no relief with tylenol at home. Denies n/v but does have sensitivity to light.

## 2017-03-04 DIAGNOSIS — F17210 Nicotine dependence, cigarettes, uncomplicated: Secondary | ICD-10-CM | POA: Insufficient documentation

## 2017-03-04 DIAGNOSIS — R51 Headache: Secondary | ICD-10-CM | POA: Insufficient documentation

## 2017-03-05 ENCOUNTER — Other Ambulatory Visit: Payer: Self-pay

## 2017-03-05 ENCOUNTER — Emergency Department (HOSPITAL_COMMUNITY)
Admission: EM | Admit: 2017-03-05 | Discharge: 2017-03-05 | Disposition: A | Discharge status: Home / Self Care | Payer: Self-pay | Attending: Emergency Medicine | Admitting: Emergency Medicine

## 2017-03-05 ENCOUNTER — Encounter (HOSPITAL_COMMUNITY): Payer: Self-pay

## 2017-03-05 ENCOUNTER — Encounter (HOSPITAL_COMMUNITY): Payer: Self-pay | Admitting: *Deleted

## 2017-03-05 ENCOUNTER — Encounter (HOSPITAL_COMMUNITY): Payer: Self-pay | Admitting: Emergency Medicine

## 2017-03-05 ENCOUNTER — Emergency Department (HOSPITAL_COMMUNITY)
Admission: EM | Admit: 2017-03-05 | Discharge: 2017-03-06 | Disposition: A | Discharge status: Home / Self Care | Payer: Self-pay | Attending: Emergency Medicine | Admitting: Emergency Medicine

## 2017-03-05 DIAGNOSIS — F17210 Nicotine dependence, cigarettes, uncomplicated: Secondary | ICD-10-CM | POA: Insufficient documentation

## 2017-03-05 DIAGNOSIS — R05 Cough: Secondary | ICD-10-CM | POA: Insufficient documentation

## 2017-03-05 DIAGNOSIS — F1410 Cocaine abuse, uncomplicated: Secondary | ICD-10-CM | POA: Insufficient documentation

## 2017-03-05 DIAGNOSIS — J45909 Unspecified asthma, uncomplicated: Secondary | ICD-10-CM | POA: Insufficient documentation

## 2017-03-05 DIAGNOSIS — R51 Headache: Secondary | ICD-10-CM | POA: Insufficient documentation

## 2017-03-05 DIAGNOSIS — R519 Headache, unspecified: Secondary | ICD-10-CM

## 2017-03-05 DIAGNOSIS — Z590 Homelessness: Secondary | ICD-10-CM | POA: Insufficient documentation

## 2017-03-05 DIAGNOSIS — R059 Cough, unspecified: Secondary | ICD-10-CM

## 2017-03-05 MED ORDER — IBUPROFEN 800 MG PO TABS
800.0000 mg | ORAL_TABLET | Freq: Once | ORAL | Status: AC
  Administered 2017-03-05: 800 mg via ORAL
  Filled 2017-03-05: qty 1

## 2017-03-05 MED ORDER — ACETAMINOPHEN 325 MG PO TABS
650.0000 mg | ORAL_TABLET | Freq: Once | ORAL | Status: AC
Start: 2017-03-05 — End: 2017-03-05
  Administered 2017-03-05: 650 mg via ORAL
  Filled 2017-03-05: qty 2

## 2017-03-05 NOTE — ED Triage Notes (Signed)
Pt here requesting tylenol for his headache before he gets on the bus, pt does not want IV and meds, pt denies visual changes, pt reports ongoing headache for 2 days, with hx of Headache, A&O c4

## 2017-03-05 NOTE — ED Provider Notes (Signed)
[facility]-EMERGENCY DEPT Provider Note   CSN: [id number] Arrival date & time: 03/04/17  2344     History   Chief Complaint Chief Complaint  Patient presents with  . Headache    HPI Dustin Norris is a 42 y.o. male.  The history is provided by the patient.  Headache   This is a new problem. The current episode started more than 2 days ago. The problem occurs constantly. The problem has not changed since onset.The headache is associated with nothing. The pain is located in the frontal region. The quality of the pain is described as dull. The pain is moderate. The pain does not radiate. Pertinent negatives include no anorexia, no fever, no malaise/fatigue, no chest pressure, no near-syncope, no orthopnea, no palpitations, no syncope, no shortness of breath, no nausea and no vomiting. He has tried nothing for the symptoms. The treatment provided mild relief.    Past Medical History:  Diagnosis Date  . Anxiety   . Asthma   . Cocaine abuse (HCC)   . Homelessness   . Migraine   . Tobacco abuse     Patient Active Problem List   Diagnosis Date Noted  . 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 Cocaine use disorder, moderate, dependence (HCC) 07/26/2016  . 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 Psychosis (HCC) 07/24/2016  . Chest pain 11/23/2015  . Asthma 11/23/2015  . Tobacco abuse 11/23/2015  . Elevated lactic acid level 11/23/2015  . Cocaine abuse (HCC)   . Concern about STD in male without diagnosis   . Paranoid ideation (HCC) 02/22/2015    History reviewed. No pertinent surgical history.     Home Medications    Prior to Admission medications   Medication Sig Start Date End Date Taking? Authorizing Provider  albuterol (PROVENTIL HFA;VENTOLIN HFA) 108 (90 Base) MCG/ACT inhaler Inhale 2 puffs into the lungs every 4 (four) hours as needed for wheezing or shortness of breath. Patient  not taking: Reported on 03/01/2017 02/03/17   Muthersbaugh, Dahlia Client, PA-C  diclofenac (VOLTAREN) 75 MG EC tablet Take 1 tablet (75 mg total) by mouth 2 (two) times daily. Patient not taking: Reported on 03/01/2017 02/09/17   Elson Areas, PA-C  fluticasone [facility]) 50 MCG/ACT nasal spray Place 2 sprays into both nostrils daily. Patient not taking: Reported on 03/01/2017 02/03/17   Muthersbaugh, Dahlia Client, PA-C  guaiFENesin-codeine 100-10 MG/5ML syrup Take 5 mLs by mouth 3 (three) times daily as needed for cough. Patient not taking: Reported on 03/01/2017 02/03/17   Janne Napoleon, NP  ibuprofen (ADVIL,MOTRIN) 800 MG tablet Take 1 tablet (800 mg total) by mouth every 8 (eight) hours as needed. Patient not taking: Reported on 03/01/2017 02/02/17   Charlestine Night, PA-C  omeprazole (PRILOSEC) 20 MG capsule Take 1 capsule (20 mg total) by mouth daily. 03/01/17   Horton, Mayer Masker, MD  predniSONE (DELTASONE) 10 MG tablet Take 2 tablets (20 mg total) by mouth daily. Patient not taking: Reported on 03/01/2017 02/03/17   Janne Napoleon, NP  promethazine-dextromethorphan (PROMETHAZINE-DM) 6.25-15 MG/5ML syrup Take 10 mLs by mouth 4 (four) times daily as needed for cough. Patient not taking: Reported on 03/01/2017 02/02/17   Charlestine Night, PA-C  terbinafine (LAMISIL AT) 1 % cream Apply 1 application topically 2 (two) times daily. Patient not taking: Reported on 03/01/2017 02/20/17   Maczis, Elmer Sow, PA-C    Family History History reviewed. No pertinent family history.  Social History Social History  Tobacco Use  . Smoking status: Current Every Day Smoker    Packs/day: 1.00    Years: 0.00    Pack years: 0.00    Types: Cigarettes, Cigars  . Smokeless tobacco: Never Used  Substance Use Topics  . Alcohol use: Yes  . Drug use: No    Comment: last use October 2017     Allergies   Haldol [haloperidol lactate]   Review of Systems Review of Systems  Constitutional: Negative for fever and  malaise/fatigue.  Eyes: Negative for photophobia.  Respiratory: Negative for shortness of breath.   Cardiovascular: Negative for palpitations, orthopnea, syncope and near-syncope.  Gastrointestinal: Negative for anorexia, nausea and vomiting.  Musculoskeletal: Negative for myalgias, neck pain and neck stiffness.  Neurological: Positive for headaches. Negative for tremors, seizures, syncope, facial asymmetry, speech difficulty, weakness and numbness.  All other systems reviewed and are negative.    Physical Exam Updated Vital Signs BP 131/85 (BP Location: Left Arm)   Pulse 74   Temp 98.4 F (36.9 C) (Oral)   Resp 18   SpO2 99%   Physical Exam  Constitutional: He is oriented to person, place, and time. He appears well-developed and well-nourished. No distress.  HENT:  Head: Normocephalic and atraumatic.  Nose: Nose normal.  Mouth/Throat: Oropharynx is clear and moist. No oropharyngeal exudate.  Eyes: EOM are normal. Pupils are equal, round, and reactive to light.  Neck: Normal range of motion. Neck supple.  Cardiovascular: Normal rate, regular rhythm, normal heart sounds and intact distal pulses.  Pulmonary/Chest: Effort normal and breath sounds normal. No stridor. No respiratory distress.  Abdominal: Soft. Bowel sounds are normal. He exhibits no mass. There is no tenderness. There is no rebound and no guarding. No hernia.  Musculoskeletal: Normal range of motion.  Neurological: He is alert and oriented to person, place, and time.  Skin: Skin is warm and dry. Capillary refill takes less than 2 seconds.  Nursing note and vitals reviewed.    ED Treatments / Results  Labs (all labs ordered are listed, but only abnormal results are displayed) Labs Reviewed - No data to display  EKG  EKG Interpretation None       Radiology No results found.  Procedures Procedures (including critical care time)  Medications Ordered in ED Medications  ibuprofen (ADVIL,MOTRIN) tablet  800 mg (800 mg Oral Given 03/05/17 0421)      Final Clinical Impressions(s) / ED Diagnoses   Final diagnoses:  Nonintractable episodic headache, unspecified headache type    Return for weakness, numbness, changes in vision or speech,  fevers > 100.4 unrelieved by medication, shortness of breath, intractable vomiting, or diarrhea, abdominal pain, Inability to tolerate liquids or food, cough, altered mental status or any concerns. No signs of systemic illness or infection. The patient is nontoxic-appearing on exam and vital signs are within normal limits.    I have reviewed the triage vital signs and the nursing notes. Pertinent labs &imaging results that were available during my care of the patient were reviewed by me and considered in my medical decision making (see chart for details).  After history, exam, and medical workup I feel the patient has been appropriately medically screened and is safe for discharge home. Pertinent diagnoses were discussed with the patient. Patient was given return precautions.   Poetry Cerro, MD 03/05/17 [id number]

## 2017-03-05 NOTE — ED Triage Notes (Signed)
Pt complains of a headache for two days 

## 2017-03-05 NOTE — ED Notes (Signed)
Bed: [facility] Expected date:  Expected time:  Means of arrival:  Comments: No Bed 

## 2017-03-05 NOTE — ED Notes (Signed)
Declined W/C at D/C and was escorted to lobby by RN. 

## 2017-03-05 NOTE — Discharge Instructions (Signed)
It was my pleasure taking care of you today! ° °Drink plenty of fluids at home. This will help with your headache.  °Please follow up with your primary care doctor.   ° °Fortunately, your evaluation today is reassuring with no apparent emergent cause for your headache at this time. With that being said, it is VERY important that you monitor your symptoms at home. If you develop worsening headache, new fever, new neck stiffness, rash, weakness, numbness, trouble with your speech, trouble walking, new or worsening symptoms or any concerning symptoms, please return to the ED immediately.  °

## 2017-03-05 NOTE — ED Triage Notes (Signed)
[place]" pt states he is out of his albuterol. Per EMS [facility] clear, Pt d/c from [facility] 3 hours ago.

## 2017-03-05 NOTE — Discharge Instructions (Signed)
Keep feet dry. Use washes and dry thoroughly. Change socks frequently.

## 2017-03-05 NOTE — ED Provider Notes (Signed)
[facility] EMERGENCY DEPARTMENT Provider Note   CSN: [id number] Arrival date & time: 03/05/17  1523     History   Chief Complaint Chief Complaint  Patient presents with  . Headache    HPI Dustin Norris is a 42 y.o. male.  The history is provided by the patient and medical records. No language interpreter was used.  Headache   Pertinent negatives include no fever.   Dustin Norris is a 42 y.o. male  with a PMH of headaches, homelessness who presents to the Emergency Department complaining of generalized headache c/w his typical headaches.  He reports that he would just like a Tylenol so that his headache will go away. He has not taken anything for his pain. He states that he cannot afford medication and came here to get it. Denies fever, chills, visual changes, weakness, numbness, neck pain or any additional complaints.   Past Medical History:  Diagnosis Date  . Anxiety   . Asthma   . Cocaine abuse (HCC)   . Homelessness   . Migraine   . Tobacco abuse     Patient Active Problem List   Diagnosis Date Noted  . 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 Cocaine use disorder, moderate, dependence (HCC) 07/26/2016  . 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 Psychosis (HCC) 07/24/2016  . Chest pain 11/23/2015  . Asthma 11/23/2015  . Tobacco abuse 11/23/2015  . Elevated lactic acid level 11/23/2015  . Cocaine abuse (HCC)   . Concern about STD in male without diagnosis   . Paranoid ideation (HCC) 02/22/2015    History reviewed. No pertinent surgical history.     Home Medications    Prior to Admission medications   Medication Sig Start Date End Date Taking? Authorizing Provider  albuterol (PROVENTIL HFA;VENTOLIN HFA) 108 (90 Base) MCG/ACT inhaler Inhale 2 puffs into the lungs every 4 (four) hours as needed for wheezing or shortness of breath. Patient not taking: Reported on 03/01/2017  02/03/17   Muthersbaugh, Dahlia Client, PA-C  diclofenac (VOLTAREN) 75 MG EC tablet Take 1 tablet (75 mg total) by mouth 2 (two) times daily. Patient not taking: Reported on 03/01/2017 02/09/17   Elson Areas, PA-C  fluticasone [facility]) 50 MCG/ACT nasal spray Place 2 sprays into both nostrils daily. Patient not taking: Reported on 03/01/2017 02/03/17   Muthersbaugh, Dahlia Client, PA-C  guaiFENesin-codeine 100-10 MG/5ML syrup Take 5 mLs by mouth 3 (three) times daily as needed for cough. Patient not taking: Reported on 03/01/2017 02/03/17   Janne Napoleon, NP  ibuprofen (ADVIL,MOTRIN) 800 MG tablet Take 1 tablet (800 mg total) by mouth every 8 (eight) hours as needed. Patient not taking: Reported on 03/01/2017 02/02/17   Charlestine Night, PA-C  omeprazole (PRILOSEC) 20 MG capsule Take 1 capsule (20 mg total) by mouth daily. 03/01/17   Horton, Mayer Masker, MD  predniSONE (DELTASONE) 10 MG tablet Take 2 tablets (20 mg total) by mouth daily. Patient not taking: Reported on 03/01/2017 02/03/17   Janne Napoleon, NP  promethazine-dextromethorphan (PROMETHAZINE-DM) 6.25-15 MG/5ML syrup Take 10 mLs by mouth 4 (four) times daily as needed for cough. Patient not taking: Reported on 03/01/2017 02/02/17   Charlestine Night, PA-C  terbinafine (LAMISIL AT) 1 % cream Apply 1 application topically 2 (two) times daily. Patient not taking: Reported on 03/01/2017 02/20/17   Jacinto Halim, PA-C    Family History No family history on file.  Social History Social History   Tobacco Use  .  Smoking status: Current Every Day Smoker    Packs/day: 1.00    Years: 0.00    Pack years: 0.00    Types: Cigarettes, Cigars  . Smokeless tobacco: Never Used  Substance Use Topics  . Alcohol use: Yes  . Drug use: No    Comment: last use October 2017     Allergies   Haldol [haloperidol lactate]   Review of Systems Review of Systems  Constitutional: Negative for chills and fever.  Eyes: Negative for visual disturbance.  Musculoskeletal:  Negative for neck pain.  Neurological: Positive for headaches. Negative for dizziness, weakness and numbness.     Physical Exam Updated Vital Signs BP (!) 156/89 (BP Location: Right Arm)   Pulse 99   Temp 98.7 F (37.1 C) (Oral)   Resp 16   SpO2 100%   Physical Exam  Constitutional: He appears well-developed and well-nourished. No distress.  HENT:  Head: Normocephalic and atraumatic.  Neck: Neck supple.  Cardiovascular: Normal rate, regular rhythm and normal heart sounds.  No murmur heard. Pulmonary/Chest: Effort normal and breath sounds normal. No respiratory distress. He has no wheezes. He has no rales.  Musculoskeletal: Normal range of motion.  Neurological: He is alert.  Speech clear and goal oriented. CN 2-12 grossly intact. Normal finger-to-nose and rapid alternating movements. No drift. Strength and sensation intact. Steady gait.   Skin: Skin is warm and dry.  Nursing note and vitals reviewed.    ED Treatments / Results  Labs (all labs ordered are listed, but only abnormal results are displayed) Labs Reviewed - No data to display  EKG  EKG Interpretation None       Radiology No results found.  Procedures Procedures (including critical care time)  Medications Ordered in ED Medications  acetaminophen (TYLENOL) tablet 650 mg (not administered)     Initial Impression / Assessment and Plan / ED Course  I have reviewed the triage vital signs and the nursing notes.  Pertinent labs & imaging results that were available during my care of the patient were reviewed by me and considered in my medical decision making (see chart for details).    Dustin Norris is a [age] y.o. male who presents to ED for headache. No focal neuro deficits on exam. Patient requesting Tylenol, stating this typically works for his headaches, but he cannot afford to get it at the store. Tylenol given. Evaluation does not show pathology that would require ongoing emergent intervention or  inpatient treatment. Reasons to return to ER for headache discussed. All questions answered.   Final Clinical Impressions(s) / ED Diagnoses   Final diagnoses:  Bad headache    ED Discharge Orders    None       Ashani Pumphrey, Chase PicketJaime Pilcher, PA-C 03/05/17 1631    Wynetta FinesMessick, Peter C, MD 03/06/17 [phone]

## 2017-03-05 NOTE — ED Provider Notes (Signed)
[facility]-EMERGENCY DEPT Provider Note   CSN: [id number] Arrival date & time: 03/05/17  1954     History   Chief Complaint Chief Complaint  Patient presents with  . Headache    HPI Dustin Norris is a 42 y.o. male here for evaluation of "bad headache and athlete's feet". Admits he is homeless and cant afford tylenol or foot powder, asking for some. Pt was seen at [facility] ED 3 hours ago for headache and given tylenol. States headache is the same it always is. Denies fevers, chills, vision changes, head trauma, neck pain or stiffness, rash, numbness or weakness distally. Unfortunately, per ER nurse pt was arrested for overusing ED last week.   HPI  Past Medical History:  Diagnosis Date  . Anxiety   . Asthma   . Cocaine abuse (HCC)   . Homelessness   . Migraine   . Tobacco abuse     Patient Active Problem List   Diagnosis Date Noted  . 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 Cocaine use disorder, moderate, dependence (HCC) 07/26/2016  . 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 Psychosis (HCC) 07/24/2016  . Chest pain 11/23/2015  . Asthma 11/23/2015  . Tobacco abuse 11/23/2015  . Elevated lactic acid level 11/23/2015  . Cocaine abuse (HCC)   . Concern about STD in male without diagnosis   . Paranoid ideation (HCC) 02/22/2015    History reviewed. No pertinent surgical history.     Home Medications    Prior to Admission medications   Medication Sig Start Date End Date Taking? Authorizing Provider  albuterol (PROVENTIL HFA;VENTOLIN HFA) 108 (90 Base) MCG/ACT inhaler Inhale 2 puffs into the lungs every 4 (four) hours as needed for wheezing or shortness of breath. Patient not taking: Reported on 03/01/2017 02/03/17   Muthersbaugh, Dahlia Client, PA-C  diclofenac (VOLTAREN) 75 MG EC tablet Take 1 tablet (75 mg total) by mouth 2 (two) times daily. Patient not taking: Reported on 03/01/2017 02/09/17    Elson Areas, PA-C  fluticasone [facility]) 50 MCG/ACT nasal spray Place 2 sprays into both nostrils daily. Patient not taking: Reported on 03/01/2017 02/03/17   Muthersbaugh, Dahlia Client, PA-C  guaiFENesin-codeine 100-10 MG/5ML syrup Take 5 mLs by mouth 3 (three) times daily as needed for cough. Patient not taking: Reported on 03/01/2017 02/03/17   Janne Napoleon, NP  ibuprofen (ADVIL,MOTRIN) 800 MG tablet Take 1 tablet (800 mg total) by mouth every 8 (eight) hours as needed. Patient not taking: Reported on 03/01/2017 02/02/17   Charlestine Night, PA-C  omeprazole (PRILOSEC) 20 MG capsule Take 1 capsule (20 mg total) by mouth daily. 03/01/17   Horton, Mayer Masker, MD  predniSONE (DELTASONE) 10 MG tablet Take 2 tablets (20 mg total) by mouth daily. Patient not taking: Reported on 03/01/2017 02/03/17   Janne Napoleon, NP  promethazine-dextromethorphan (PROMETHAZINE-DM) 6.25-15 MG/5ML syrup Take 10 mLs by mouth 4 (four) times daily as needed for cough. Patient not taking: Reported on 03/01/2017 02/02/17   Charlestine Night, PA-C  terbinafine (LAMISIL AT) 1 % cream Apply 1 application topically 2 (two) times daily. Patient not taking: Reported on 03/01/2017 02/20/17   Maczis, Elmer Sow, PA-C    Family History History reviewed. No pertinent family history.  Social History Social History   Tobacco Use  . Smoking status: Current Every Day Smoker    Packs/day: 1.00    Years: 0.00    Pack years: 0.00    Types: Cigarettes, Cigars  .  Smokeless tobacco: Never Used  Substance Use Topics  . Alcohol use: Yes  . Drug use: No    Comment: last use October 2017     Allergies   Haldol [haloperidol lactate]   Review of Systems Review of Systems  Skin: Positive for color change (athlete's feet).  Neurological: Positive for headaches.  All other systems reviewed and are negative.    Physical Exam Updated Vital Signs BP (!) 150/93   Pulse 87   Temp 98.6 F (37 C) (Oral)   Resp 18   SpO2 100%   Physical  Exam  Constitutional: He is oriented to person, place, and time. He appears well-developed and well-nourished. No distress.  NAD.  HENT:  Head: Normocephalic and atraumatic.  Right Ear: External ear normal.  Left Ear: External ear normal.  Nose: Nose normal.  Atraumatic. No tenderness to temporal arteries.   Eyes: Conjunctivae and EOM are normal. No scleral icterus.  PERRL and EOMs intact. No conjunctival/scleral injection   Neck: Normal range of motion. Neck supple.  No cervical spine tenderness. Full PROM of neck without rigidity   Cardiovascular: Normal rate, regular rhythm, normal heart sounds and intact distal pulses.  No murmur heard. Pulmonary/Chest: Effort normal and breath sounds normal. He has no wheezes.  Musculoskeletal: Normal range of motion. He exhibits no deformity.  Neurological: He is alert and oriented to person, place, and time.  Alert and oriented to self, place, time and event.  Speech is fluent without aphasia. Strength 5/5 with hand grip and ankle F/E.   Sensation to light touch intact in hands and feet. Normal gait. CN I and VIII not tested. CN II-XII intact bilaterally.   Skin: Skin is warm and dry. Capillary refill takes less than 2 seconds.  Moist skin to bilateral feet. Hyperkeratotic and hyperpigmented toe nails, worse at big toes. No erythema, edema, warmth, fluctuance or signs of cellulitis to bilateral feet.   Psychiatric: He has a normal mood and affect. His behavior is normal. Judgment and thought content normal.  Nursing note and vitals reviewed.    ED Treatments / Results  Labs (all labs ordered are listed, but only abnormal results are displayed) Labs Reviewed - No data to display  EKG  EKG Interpretation None       Radiology No results found.  Procedures Procedures (including critical care time)  Medications Ordered in ED Medications  ibuprofen (ADVIL,MOTRIN) tablet 800 mg (800 mg Oral Given 03/05/17 2125)     Initial  Impression / Assessment and Plan / ED Course  I have reviewed the triage vital signs and the nursing notes.  Pertinent labs & imaging results that were available during my care of the patient were reviewed by me and considered in my medical decision making (see chart for details).     Well known to ED. I was told pt was arrested last week for overuse of ED services.  Gave pt benefit of the doubt, and gave him goodie bag with hygiene supplies for athlete's foot. Ibuprofen prior to dc.  No signs of cellulitis or septic arthritis to feet. No red flag symptoms of headache. Patient is without high-risk features of headache including: sudden onset/thunderclap HA, AMS, seizure, headache with exertion, age > 50, history of immunocompromise, neck rigidity, fever, use of anticoagulation, trauma, h/o CVA/TIA or HTN. On exam VS are WNL, pt is well-appearing w/ no meningismus, nystagmus, focal neuro deficits, pain over temporal arteries.    Given reassuring hx and exam, emergent imaging or  labs not indicated given. Low suspicion for emergent intracranial or vascular etiology.   Final Clinical Impressions(s) / ED Diagnoses   Final diagnoses:  Bad headache    ED Discharge Orders    None       Jerrell MylarGibbons, Rigby Swamy J, PA-C 03/05/17 2130    Shaune PollackIsaacs, Cameron, MD 03/05/17 [phone]

## 2017-03-05 NOTE — ED Triage Notes (Signed)
Pt is c/o headache today  Pt states it is throbbing

## 2017-03-06 ENCOUNTER — Encounter (HOSPITAL_COMMUNITY): Payer: Self-pay | Admitting: Emergency Medicine

## 2017-03-06 DIAGNOSIS — Z79899 Other long term (current) drug therapy: Secondary | ICD-10-CM | POA: Insufficient documentation

## 2017-03-06 DIAGNOSIS — Z590 Homelessness: Secondary | ICD-10-CM | POA: Insufficient documentation

## 2017-03-06 DIAGNOSIS — F17210 Nicotine dependence, cigarettes, uncomplicated: Secondary | ICD-10-CM | POA: Insufficient documentation

## 2017-03-06 DIAGNOSIS — J45909 Unspecified asthma, uncomplicated: Secondary | ICD-10-CM | POA: Insufficient documentation

## 2017-03-06 DIAGNOSIS — R51 Headache: Secondary | ICD-10-CM | POA: Insufficient documentation

## 2017-03-06 MED ORDER — ALBUTEROL SULFATE HFA 108 (90 BASE) MCG/ACT IN AERS
2.0000 | INHALATION_SPRAY | Freq: Once | RESPIRATORY_TRACT | Status: AC
  Administered 2017-03-06: 2 via RESPIRATORY_TRACT
  Filled 2017-03-06: qty 6.7

## 2017-03-06 NOTE — ED Notes (Signed)
Called patient for vitals recheck. No answer.

## 2017-03-06 NOTE — ED Provider Notes (Signed)
[facility] EMERGENCY DEPARTMENT Provider Note   CSN: [id number] Arrival date & time: 03/05/17  2317     History   Chief Complaint Chief Complaint  Patient presents with  . Asthma    HPI Dustin Norris is a 42 y.o. male.  HPI  Patient is a 42 year old male with a history of asthma, anxiety, headaches, cocaine use presenting for cough, and concerns that he is "not feeling himself".  Patient reports that he feels he may be coming down with an upper respiratory infection.  Patient reports that he feels he is wheezing and has been without an inhaler due to believing he lost set at a homeless shelter.  Patient denies any fever or chills.  Patient denies sputum production, congestion, rhinorrhea, or sore throat.  No chest pain.  Patient presented at 3 prior visits to the emergency department within this 24-hour for headaches, which he is asymptomatic of at present.  Patient reported he is without stable housing at this time.  Past Medical History:  Diagnosis Date  . Anxiety   . Asthma   . Cocaine abuse (HCC)   . Homelessness   . Migraine   . Tobacco abuse     Patient Active Problem List   Diagnosis Date Noted  . 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 Cocaine use disorder, moderate, dependence (HCC) 07/26/2016  . 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 Psychosis (HCC) 07/24/2016  . Chest pain 11/23/2015  . Asthma 11/23/2015  . Tobacco abuse 11/23/2015  . Elevated lactic acid level 11/23/2015  . Cocaine abuse (HCC)   . Concern about STD in male without diagnosis   . Paranoid ideation (HCC) 02/22/2015    History reviewed. No pertinent surgical history.     Home Medications    Prior to Admission medications   Medication Sig Start Date End Date Taking? Authorizing Provider  albuterol (PROVENTIL HFA;VENTOLIN HFA) 108 (90 Base) MCG/ACT inhaler Inhale 2 puffs into the lungs every 4 (four)  hours as needed for wheezing or shortness of breath. Patient not taking: Reported on 03/01/2017 02/03/17   Muthersbaugh, Dahlia ClientHannah, PA-C  diclofenac (VOLTAREN) 75 MG EC tablet Take 1 tablet (75 mg total) by mouth 2 (two) times daily. Patient not taking: Reported on 03/01/2017 02/09/17   Elson AreasSofia, Leslie K, PA-C  fluticasone [facility](FLONASE) 50 MCG/ACT nasal spray Place 2 sprays into both nostrils daily. Patient not taking: Reported on 03/01/2017 02/03/17   Muthersbaugh, Dahlia ClientHannah, PA-C  guaiFENesin-codeine 100-10 MG/5ML syrup Take 5 mLs by mouth 3 (three) times daily as needed for cough. Patient not taking: Reported on 03/01/2017 02/03/17   Janne NapoleonNeese, Hope M, NP  ibuprofen (ADVIL,MOTRIN) 800 MG tablet Take 1 tablet (800 mg total) by mouth every 8 (eight) hours as needed. Patient not taking: Reported on 03/01/2017 02/02/17   Charlestine NightLawyer, Christopher, PA-C  omeprazole (PRILOSEC) 20 MG capsule Take 1 capsule (20 mg total) by mouth daily. 03/01/17   Horton, Mayer Maskerourtney F, MD  predniSONE (DELTASONE) 10 MG tablet Take 2 tablets (20 mg total) by mouth daily. Patient not taking: Reported on 03/01/2017 02/03/17   Janne NapoleonNeese, Hope M, NP  promethazine-dextromethorphan (PROMETHAZINE-DM) 6.25-15 MG/5ML syrup Take 10 mLs by mouth 4 (four) times daily as needed for cough. Patient not taking: Reported on 03/01/2017 02/02/17   Charlestine NightLawyer, Christopher, PA-C  terbinafine (LAMISIL AT) 1 % cream Apply 1 application topically 2 (two) times daily. Patient not taking: Reported on 03/01/2017 02/20/17   Jacinto HalimMaczis, Michael M, PA-C  Family History No family history on file.  Social History Social History   Tobacco Use  . Smoking status: Current Every Day Smoker    Packs/day: 1.00    Years: 0.00    Pack years: 0.00    Types: Cigarettes, Cigars  . Smokeless tobacco: Never Used  Substance Use Topics  . Alcohol use: Yes  . Drug use: No    Comment: last use October 2017     Allergies   Haldol [haloperidol lactate]   Review of Systems Review of Systems    Constitutional: Negative for chills and fever.  HENT: Negative for congestion, rhinorrhea and sore throat.   Respiratory: Positive for cough and wheezing.   Cardiovascular: Negative for chest pain.  Gastrointestinal: Negative for nausea and vomiting.     Physical Exam Updated Vital Signs BP 126/89 (BP Location: Left Arm)   Pulse 61   Temp 97.8 F (36.6 C) (Oral)   Resp 20   Ht 5\' 7"  (1.702 m)   Wt 74.8 kg (165 lb)   SpO2 100%   BMI 25.84 kg/m   Physical Exam  Constitutional: He appears well-developed and well-nourished. No distress.  Sitting comfortably in bed.  HENT:  Head: Normocephalic and atraumatic.  Eyes: Conjunctivae are normal. Right eye exhibits no discharge. Left eye exhibits no discharge.  EOMs normal to gross examination.  Neck: Normal range of motion.  Cardiovascular: Normal rate, regular rhythm and intact distal pulses.  Pulmonary/Chest: Effort normal and breath sounds normal. He has no wheezes. He has no rales.  Normal respiratory effort. Patient converses comfortably. No audible wheeze or stridor.  Abdominal: He exhibits no distension.  Musculoskeletal: Normal range of motion.  No lower extremity edema.  Neurological: He is alert.  Cranial nerves intact to gross observation. Patient moves extremities without difficulty.  Skin: Skin is warm and dry. He is not diaphoretic.  Psychiatric: He has a normal mood and affect. His behavior is normal. Judgment and thought content normal.  Nursing note and vitals reviewed.    ED Treatments / Results  Labs (all labs ordered are listed, but only abnormal results are displayed) Labs Reviewed - No data to display  EKG  EKG Interpretation None       Radiology No results found.  Procedures Procedures (including critical care time)  Medications Ordered in ED Medications  albuterol (PROVENTIL HFA;VENTOLIN HFA) 108 (90 Base) MCG/ACT inhaler 2 puff (2 puffs Inhalation Given 03/06/17 0126)     Initial  Impression / Assessment and Plan / ED Course  I have reviewed the triage vital signs and the nursing notes.  Pertinent labs & imaging results that were available during my care of the patient were reviewed by me and considered in my medical decision making (see chart for details).     Final Clinical Impressions(s) / ED Diagnoses   Final diagnoses:  Cough   Patient is nontoxic-appearing, afebrile, and in no acute distress.  Pulmonary exam is not significant for wheezing or other adventitious lung sounds.  Do not feel that chest x-ray or further workup is warranted at this time.  Patient's primary concern at this time is his inability to stay in the hospital.  Patient appearing to have resolution of headache that prompted 3 prior ED visits today.  No signs or symptoms to suggest asthma exacerbation or pneumonia. I discussed with patient that we will resupply his inhaler, and that I will make a case management consult to assist him with primary care and financial resource options.  Patient reports that he has previously been at the [facility], but not recently.  Patient given return precautions for any worsening chest pain, shortness of breath, cough productive of sputum, or fever or chills.  Patient is in understanding and agrees with the plan of care.  This is a supervised visit with Dr. Derwood Kaplan. Evaluation, management, and discharge planning discussed with this attending physician.  ED Discharge Orders    None       Delia Chimes 03/06/17 0315    Derwood Kaplan, MD 03/07/17 [phone]

## 2017-03-06 NOTE — ED Notes (Addendum)
Pt came in waiting area with 2 bags and has been sitting down in lobby on cellphone.  Security questioned pt about being a patient or visitor and he said he was checking in.

## 2017-03-06 NOTE — ED Triage Notes (Signed)
Reports migraine that started yesterday.  Hx of same.  Has not taken anything.

## 2017-03-06 NOTE — Discharge Instructions (Addendum)
Please see the information and instructions below regarding your visit.  Your diagnoses today include:  1. Cough      Tests performed today include: See side panel of your discharge paperwork for testing performed today. Vital signs are listed at the bottom of these instructions.   Medications prescribed:    Take any prescribed medications only as prescribed, and any over the counter medications only as directed on the packaging.  Please use your inhaler when you are short of breath.  Home care instructions:  Please follow any educational materials contained in this packet.   Follow-up instructions: Please follow-up with your primary care provider in  for further evaluation of your symptoms if they are not completely improved.   I placed a case management consult to be able to contact you and assist with getting primary care.  Return instructions:  Please return to the Emergency Department if you experience worsening symptoms.  Please return to the emergency department for any new or worsening symptoms such as passing out spells, dizziness, lightheadedness, worsening cough or shortness of breath, or worsening chest pain. Please return if you have any other emergent concerns.  Additional Information:   Your vital signs today were: BP 139/87 (BP Location: Right Arm)    Pulse 64    Temp 98.6 F (37 C) (Oral)    Resp 15    Ht 5\' 7"  (1.702 m)    Wt 74.8 kg (165 lb)    SpO2 100%    BMI 25.84 kg/m  If your blood pressure (BP) was elevated on multiple readings during this visit above 130 for the top number or above 80 for the bottom number, please have this repeated by your primary care provider within one month. --------------  Thank you for allowing us to participate in your care today.

## 2017-03-07 ENCOUNTER — Emergency Department (HOSPITAL_COMMUNITY): Admission: EM | Admit: 2017-03-07 | Discharge: 2017-03-07 | Discharge status: Against Medical Advice | Payer: Self-pay

## 2017-03-07 ENCOUNTER — Other Ambulatory Visit: Payer: Self-pay

## 2017-03-07 ENCOUNTER — Emergency Department (HOSPITAL_COMMUNITY)
Admission: EM | Admit: 2017-03-07 | Discharge: 2017-03-07 | Disposition: A | Discharge status: Home / Self Care | Payer: Self-pay | Attending: Emergency Medicine | Admitting: Emergency Medicine

## 2017-03-07 ENCOUNTER — Encounter (HOSPITAL_COMMUNITY): Payer: Self-pay | Admitting: Emergency Medicine

## 2017-03-07 DIAGNOSIS — R51 Headache: Secondary | ICD-10-CM

## 2017-03-07 DIAGNOSIS — R519 Headache, unspecified: Secondary | ICD-10-CM

## 2017-03-07 DIAGNOSIS — J45909 Unspecified asthma, uncomplicated: Secondary | ICD-10-CM | POA: Insufficient documentation

## 2017-03-07 DIAGNOSIS — F419 Anxiety disorder, unspecified: Secondary | ICD-10-CM | POA: Insufficient documentation

## 2017-03-07 DIAGNOSIS — F17210 Nicotine dependence, cigarettes, uncomplicated: Secondary | ICD-10-CM | POA: Insufficient documentation

## 2017-03-07 DIAGNOSIS — F1410 Cocaine abuse, uncomplicated: Secondary | ICD-10-CM | POA: Insufficient documentation

## 2017-03-07 DIAGNOSIS — Z79899 Other long term (current) drug therapy: Secondary | ICD-10-CM | POA: Insufficient documentation

## 2017-03-07 DIAGNOSIS — Z590 Homelessness: Secondary | ICD-10-CM | POA: Insufficient documentation

## 2017-03-07 MED ORDER — METOCLOPRAMIDE HCL 10 MG PO TABS
10.0000 mg | ORAL_TABLET | Freq: Once | ORAL | Status: AC
  Administered 2017-03-07: 10 mg via ORAL
  Filled 2017-03-07: qty 1

## 2017-03-07 MED ORDER — ACETAMINOPHEN 325 MG PO TABS
650.0000 mg | ORAL_TABLET | Freq: Once | ORAL | Status: AC
  Administered 2017-03-07: 650 mg via ORAL
  Filled 2017-03-07: qty 2

## 2017-03-07 MED ORDER — NAPROXEN 500 MG PO TABS: 500.0000 mg | ORAL_TABLET | Freq: Two times a day (BID) | ORAL | 0 refills | Status: DC

## 2017-03-07 MED ORDER — NAPROXEN 250 MG PO TABS
500.0000 mg | ORAL_TABLET | Freq: Once | ORAL | Status: AC
  Administered 2017-03-07: 500 mg via ORAL
  Filled 2017-03-07: qty 2

## 2017-03-07 NOTE — ED Notes (Signed)
Pt sleeping in waiting area ?

## 2017-03-07 NOTE — ED Notes (Signed)
Pt has been walking outside multiple times.  Informed that we need to recheck vitals.

## 2017-03-07 NOTE — Discharge Instructions (Signed)
Please return to the ER if the headache gets severe and in not improving, you have associated new one sided numbness, tingling, weakness or confusion, seizures, poor balance or poor vision. ° ° ° ° °

## 2017-03-07 NOTE — ED Notes (Signed)
Writer called for triage room, no response 

## 2017-03-07 NOTE — ED Provider Notes (Signed)
[facility]   CSN: [id number] Arrival date & time: 03/06/17  1823     History   Chief Complaint Chief Complaint  Patient presents with  . Migraine    HPI Dustin Norris is a 42 y.o. male.  HPI  42 year old comes in with chief complaint of headache.  Patient has history of cocaine abuse, migraines.  Patient states that he has been having headache for 1 day.  Patient has not taken any medicine for it.  Patient states that his headache has been quite persistent for the last several days.  Patient denies any associated nausea, vomiting, vision changes, photophobia, focal numbness or tingling.  Past Medical History:  Diagnosis Date  . Anxiety   . Asthma   . Cocaine abuse (HCC)   . Homelessness   . Migraine   . Tobacco abuse     Patient Active Problem List   Diagnosis Date Noted  . 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 Cocaine use disorder, moderate, dependence (HCC) 07/26/2016  . 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 Psychosis (HCC) 07/24/2016  . Chest pain 11/23/2015  . Asthma 11/23/2015  . Tobacco abuse 11/23/2015  . Elevated lactic acid level 11/23/2015  . Cocaine abuse (HCC)   . Concern about STD in male without diagnosis   . Paranoid ideation (HCC) 02/22/2015    History reviewed. No pertinent surgical history.     Home Medications    Prior to Admission medications   Medication Sig Start Date End Date Taking? Authorizing Provider  albuterol (PROVENTIL HFA;VENTOLIN HFA) 108 (90 Base) MCG/ACT inhaler Inhale 2 puffs into the lungs every 4 (four) hours as needed for wheezing or shortness of breath. Patient not taking: Reported on 03/01/2017 02/03/17   Muthersbaugh, Dahlia ClientHannah, PA-C  diclofenac (VOLTAREN) 75 MG EC tablet Take 1 tablet (75 mg total) by mouth 2 (two) times daily. Patient not taking: Reported on 03/01/2017 02/09/17   Elson AreasSofia, Leslie K, PA-C    fluticasone [facility](FLONASE) 50 MCG/ACT nasal spray Place 2 sprays into both nostrils daily. Patient not taking: Reported on 03/01/2017 02/03/17   Muthersbaugh, Dahlia ClientHannah, PA-C  guaiFENesin-codeine 100-10 MG/5ML syrup Take 5 mLs by mouth 3 (three) times daily as needed for cough. Patient not taking: Reported on 03/01/2017 02/03/17   Janne NapoleonNeese, Hope M, NP  ibuprofen (ADVIL,MOTRIN) 800 MG tablet Take 1 tablet (800 mg total) by mouth every 8 (eight) hours as needed. Patient not taking: Reported on 03/01/2017 02/02/17   Charlestine NightLawyer, Christopher, PA-C  naproxen (NAPROSYN) 500 MG tablet Take 1 tablet (500 mg total) by mouth 2 (two) times daily. 03/07/17   Derwood KaplanNanavati, Ruby Logiudice, MD  omeprazole (PRILOSEC) 20 MG capsule Take 1 capsule (20 mg total) by mouth daily. 03/01/17   Horton, Mayer Maskerourtney F, MD  predniSONE (DELTASONE) 10 MG tablet Take 2 tablets (20 mg total) by mouth daily. Patient not taking: Reported on 03/01/2017 02/03/17   Janne NapoleonNeese, Hope M, NP  promethazine-dextromethorphan (PROMETHAZINE-DM) 6.25-15 MG/5ML syrup Take 10 mLs by mouth 4 (four) times daily as needed for cough. Patient not taking: Reported on 03/01/2017 02/02/17   Charlestine NightLawyer, Christopher, PA-C  terbinafine (LAMISIL AT) 1 % cream Apply 1 application topically 2 (two) times daily. Patient not taking: Reported on 03/01/2017 02/20/17   Jacinto HalimMaczis, Michael M, PA-C    Family History No family history on file.  Social History Social History   Tobacco Use  . Smoking status: Current Every Day Smoker    Packs/day: 1.00  Years: 0.00    Pack years: 0.00    Types: Cigarettes, Cigars  . Smokeless tobacco: Never Used  Substance Use Topics  . Alcohol use: Yes  . Drug use: No    Comment: last use October 2017     Allergies   Haldol [haloperidol lactate]   Review of Systems Review of Systems  All other systems reviewed and are negative.    Physical Exam Updated Vital Signs BP 136/77 (BP Location: Right Arm)   Pulse 76   Temp 98.9 F (37.2 C) (Oral)   Resp 16   Ht 5\' 7"   (1.702 m)   Wt 73 kg (161 lb)   SpO2 98%   BMI 25.22 kg/m   Physical Exam  Constitutional: He is oriented to person, place, and time. He appears well-developed.  HENT:  Head: Atraumatic.  Neck: Neck supple.  Cardiovascular: Normal rate.  Pulmonary/Chest: Effort normal.  Neurological: He is alert and oriented to person, place, and time. No cranial nerve deficit. Coordination normal.  Skin: Skin is warm.  Nursing note and vitals reviewed.    ED Treatments / Results  Labs (all labs ordered are listed, but only abnormal results are displayed) Labs Reviewed - No data to display  EKG  EKG Interpretation None       Radiology No results found.  Procedures Procedures (including critical care time)  Medications Ordered in ED Medications  naproxen (NAPROSYN) tablet 500 mg (500 mg Oral Given 03/07/17 0449)  acetaminophen (TYLENOL) tablet 650 mg (650 mg Oral Given 03/07/17 0448)  metoCLOPramide (REGLAN) tablet 10 mg (10 mg Oral Given 03/07/17 0449)     Initial Impression / Assessment and Plan / ED Course  I have reviewed the triage vital signs and the nursing notes.  Pertinent labs & imaging results that were available during my care of the patient were reviewed by me and considered in my medical decision making (see chart for details).     42 year old comes in with chief complaint of headache.  He reports history of migraines, however on history patient's headaches are constant, but there is no photophobia, phonophobia, nausea and the headaches are generalized.  CT scan from January 19 reviewed, and patient did not have any tumors.  Patient has no focal neurologic deficits, he does not have any neck pain -and we do not think any further workup is needed.  Advised to follow-up with neurology of the headaches persist.  Final Clinical Impressions(s) / ED Diagnoses   Final diagnoses:  Bad headache    ED Discharge Orders        Ordered    naproxen (NAPROSYN) 500 MG tablet   2 times daily     03/07/17 0506       Derwood Kaplan, MD 03/07/17 [phone]

## 2017-03-07 NOTE — ED Triage Notes (Signed)
Pt c/o 10/10 headache and generalized body ache since yesterday not getting better, no nausea, vomiting, fever or chills.

## 2017-03-08 ENCOUNTER — Emergency Department (HOSPITAL_COMMUNITY)
Admission: EM | Admit: 2017-03-08 | Discharge: 2017-03-09 | Disposition: A | Discharge status: Home / Self Care | Payer: Self-pay | Attending: Emergency Medicine | Admitting: Emergency Medicine

## 2017-03-08 ENCOUNTER — Other Ambulatory Visit: Payer: Self-pay

## 2017-03-08 ENCOUNTER — Emergency Department (HOSPITAL_COMMUNITY)
Admission: EM | Admit: 2017-03-08 | Discharge: 2017-03-08 | Disposition: A | Discharge status: Home / Self Care | Payer: Self-pay | Attending: Emergency Medicine | Admitting: Emergency Medicine

## 2017-03-08 ENCOUNTER — Encounter (HOSPITAL_COMMUNITY): Payer: Self-pay | Admitting: Emergency Medicine

## 2017-03-08 ENCOUNTER — Encounter (HOSPITAL_COMMUNITY): Payer: Self-pay

## 2017-03-08 DIAGNOSIS — Z79899 Other long term (current) drug therapy: Secondary | ICD-10-CM | POA: Insufficient documentation

## 2017-03-08 DIAGNOSIS — J45909 Unspecified asthma, uncomplicated: Secondary | ICD-10-CM | POA: Insufficient documentation

## 2017-03-08 DIAGNOSIS — R519 Headache, unspecified: Secondary | ICD-10-CM

## 2017-03-08 DIAGNOSIS — F17210 Nicotine dependence, cigarettes, uncomplicated: Secondary | ICD-10-CM | POA: Insufficient documentation

## 2017-03-08 DIAGNOSIS — G8929 Other chronic pain: Secondary | ICD-10-CM

## 2017-03-08 DIAGNOSIS — Z590 Homelessness: Secondary | ICD-10-CM | POA: Insufficient documentation

## 2017-03-08 DIAGNOSIS — Z5321 Procedure and treatment not carried out due to patient leaving prior to being seen by health care provider: Secondary | ICD-10-CM | POA: Insufficient documentation

## 2017-03-08 DIAGNOSIS — R51 Headache: Secondary | ICD-10-CM

## 2017-03-08 MED ORDER — NAPROXEN 250 MG PO TABS
500.0000 mg | ORAL_TABLET | Freq: Once | ORAL | Status: AC
  Administered 2017-03-08: 500 mg via ORAL
  Filled 2017-03-08: qty 2

## 2017-03-08 MED ORDER — METOCLOPRAMIDE HCL 10 MG PO TABS
10.0000 mg | ORAL_TABLET | Freq: Once | ORAL | Status: AC
  Administered 2017-03-08: 10 mg via ORAL
  Filled 2017-03-08: qty 1

## 2017-03-08 MED ORDER — ACETAMINOPHEN 325 MG PO TABS
650.0000 mg | ORAL_TABLET | Freq: Once | ORAL | Status: AC
  Administered 2017-03-09: 650 mg via ORAL
  Filled 2017-03-08: qty 2

## 2017-03-08 NOTE — Discharge Instructions (Addendum)
We will request our Social Worker to contact you to see if we can help out.

## 2017-03-08 NOTE — ED Triage Notes (Signed)
Patient recently left [facility] c/o headache x 1 month. A&O x4. Patient in NAD.

## 2017-03-08 NOTE — ED Triage Notes (Signed)
Pt states generalized headache intermittently X1 month. Currently started this morning. Seen here earlier today for generalized body aches and chills.Pt ao 4. No unilateral weakness noted.

## 2017-03-08 NOTE — ED Notes (Signed)
[facility].

## 2017-03-08 NOTE — Discharge Instructions (Signed)
Please read instructions below. You can take 600 mg of Advil/ibuprofen every 6 hours as needed for headache. Schedule an appointment with your primary care provider/neurologist to follow up on your headache and discuss preventative treatment. Return to the ER for severely worsening headache, vision changes, fever, weakness or numbness, or new or concerning symptoms.

## 2017-03-08 NOTE — ED Notes (Signed)
Pt verbalizes understanding of d/c instructions. Pt ambulatory at d/c with all belongings.   

## 2017-03-08 NOTE — ED Provider Notes (Signed)
[facility]   CSN: [id number] Arrival date & time: 03/07/17  2046     History   Chief Complaint Chief Complaint  Patient presents with  . Headache    HPI Dustin Norris is a 42 y.o. male w PMHx cocaine abuse, homelessness, asthma, migraines, presenting to ED for HA. Pt seen yesterday morning for same complaint, and provided referral for neurology for chronic HA. Pt states pain is aching, generalized to entire head. Reports HA feels like his typical HA. Has not taken any medications for pain. Denies assoc N, vision changes, photophobia, neck pain/stiffness, or any other complaints. No recent head trauma. Denies recent drug use.  The history is provided by the patient.    Past Medical History:  Diagnosis Date  . Anxiety   . Asthma   . Cocaine abuse (HCC)   . Homelessness   . Migraine   . Tobacco abuse     Patient Active Problem List   Diagnosis Date Noted  . 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 Cocaine use disorder, moderate, dependence (HCC) 07/26/2016  . 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 Psychosis (HCC) 07/24/2016  . Chest pain 11/23/2015  . Asthma 11/23/2015  . Tobacco abuse 11/23/2015  . Elevated lactic acid level 11/23/2015  . Cocaine abuse (HCC)   . Concern about STD in male without diagnosis   . Paranoid ideation (HCC) 02/22/2015    History reviewed. No pertinent surgical history.     Home Medications    Prior to Admission medications   Medication Sig Start Date End Date Taking? Authorizing Provider  albuterol (PROVENTIL HFA;VENTOLIN HFA) 108 (90 Base) MCG/ACT inhaler Inhale 2 puffs into the lungs every 4 (four) hours as needed for wheezing or shortness of breath. Patient not taking: Reported on 03/01/2017 02/03/17   Muthersbaugh, Dahlia ClientHannah, PA-C  diclofenac (VOLTAREN) 75 MG EC tablet Take 1 tablet (75 mg total) by mouth 2 (two) times  daily. Patient not taking: Reported on 03/01/2017 02/09/17   Elson AreasSofia, Leslie K, PA-C  fluticasone [facility](FLONASE) 50 MCG/ACT nasal spray Place 2 sprays into both nostrils daily. Patient not taking: Reported on 03/01/2017 02/03/17   Muthersbaugh, Dahlia ClientHannah, PA-C  guaiFENesin-codeine 100-10 MG/5ML syrup Take 5 mLs by mouth 3 (three) times daily as needed for cough. Patient not taking: Reported on 03/01/2017 02/03/17   Janne NapoleonNeese, Hope M, NP  ibuprofen (ADVIL,MOTRIN) 800 MG tablet Take 1 tablet (800 mg total) by mouth every 8 (eight) hours as needed. Patient not taking: Reported on 03/01/2017 02/02/17   Charlestine NightLawyer, Christopher, PA-C  naproxen (NAPROSYN) 500 MG tablet Take 1 tablet (500 mg total) by mouth 2 (two) times daily. 03/07/17   Derwood KaplanNanavati, Ankit, MD  omeprazole (PRILOSEC) 20 MG capsule Take 1 capsule (20 mg total) by mouth daily. 03/01/17   Horton, Mayer Maskerourtney F, MD  predniSONE (DELTASONE) 10 MG tablet Take 2 tablets (20 mg total) by mouth daily. Patient not taking: Reported on 03/01/2017 02/03/17   Janne NapoleonNeese, Hope M, NP  promethazine-dextromethorphan (PROMETHAZINE-DM) 6.25-15 MG/5ML syrup Take 10 mLs by mouth 4 (four) times daily as needed for cough. Patient not taking: Reported on 03/01/2017 02/02/17   Charlestine NightLawyer, Christopher, PA-C  terbinafine (LAMISIL AT) 1 % cream Apply 1 application topically 2 (two) times daily. Patient not taking: Reported on 03/01/2017 02/20/17   Jacinto HalimMaczis, Michael M, PA-C    Family History No family history on file.  Social History Social History   Tobacco Use  . Smoking  status: Current Every Day Smoker    Packs/day: 1.00    Years: 0.00    Pack years: 0.00    Types: Cigarettes, Cigars  . Smokeless tobacco: Never Used  Substance Use Topics  . Alcohol use: Yes  . Drug use: No    Comment: last use October 2017     Allergies   Haldol [haloperidol lactate]   Review of Systems Review of Systems  Constitutional: Negative for fever.  Eyes: Negative for photophobia and visual disturbance.    Gastrointestinal: Negative for nausea.  Musculoskeletal: Negative for neck pain and neck stiffness.  Neurological: Positive for headaches.  All other systems reviewed and are negative.    Physical Exam Updated Vital Signs BP 119/70 (BP Location: Right Arm)   Pulse 81   Temp 98.6 F (37 C)   Resp 14   Ht 5\' 7"  (1.702 m)   Wt 73 kg (161 lb)   SpO2 100%   BMI 25.22 kg/m   Physical Exam  Constitutional: He is oriented to person, place, and time. He appears well-developed and well-nourished. No distress.  Sleeping upon evaluation.  HENT:  Head: Normocephalic and atraumatic.  Eyes: Conjunctivae and EOM are normal. Pupils are equal, round, and reactive to light.  Neck: Normal range of motion. Neck supple.  Cardiovascular: Normal rate, regular rhythm, normal heart sounds and intact distal pulses.  Pulmonary/Chest: Effort normal and breath sounds normal.  Abdominal: Soft.  Neurological: He is alert and oriented to person, place, and time.  Mental Status:  Alert, oriented, thought content appropriate, able to give a coherent history. Speech fluent without evidence of aphasia. Able to follow 2 step commands without difficulty.  Cranial Nerves:  II:  Peripheral visual fields grossly normal, pupils equal, round, reactive to light III,IV, VI: ptosis not present, extra-ocular motions intact bilaterally  V,VII: smile symmetric, facial light touch sensation equal VIII: hearing grossly normal to voice  X: uvula elevates symmetrically  XI: bilateral shoulder shrug symmetric and strong XII: midline tongue extension without fassiculations Motor:  Normal tone. 5/5 in upper and lower extremities bilaterally including strong and equal grip strength and dorsiflexion/plantar flexion Sensory: Pinprick and light touch normal in all extremities.  Deep Tendon Reflexes: 2+ and symmetric in the biceps and patella Cerebellar: normal finger-to-nose with bilateral upper extremities Gait: normal gait and  balance CV: distal pulses palpable throughout    Skin: Skin is warm.  Psychiatric: He has a normal mood and affect. His behavior is normal.  Nursing note and vitals reviewed.    ED Treatments / Results  Labs (all labs ordered are listed, but only abnormal results are displayed) Labs Reviewed - No data to display  EKG  EKG Interpretation None       Radiology No results found.  Procedures Procedures (including critical care time)  Medications Ordered in ED Medications  naproxen (NAPROSYN) tablet 500 mg (not administered)  metoCLOPramide (REGLAN) tablet 10 mg (10 mg Oral Given 03/08/17 0545)     Initial Impression / Assessment and Plan / ED Course  I have reviewed the triage vital signs and the nursing notes.  Pertinent labs & imaging results that were available during my care of the patient were reviewed by me and considered in my medical decision making (see chart for details).     Pt w typical chronic HA. HA treated and improved while in ED; on re-evaluation pt is sleeping and not in distress.  Presentation is like pts typical HA and non concerning for [facility],  ICH, Meningitis, or temporal arteritis. Pt is afebrile with no focal neuro deficits, nuchal rigidity, or change in vision. This is patient's 6th ED within the last 5 days, with frequent ED visits in the last 6 months totaling 54 visits. Encouraged pt to follow up with PCP to discuss prophylactic medication. Pt given neuro referral last visit, encouraged pt to follow up with neurology. Pt verbalizes understanding and is agreeable with plan to dc.   Discussed results, findings, treatment and follow up. Patient advised of return precautions. Patient verbalized understanding and agreed with plan.  Final Clinical Impressions(s) / ED Diagnoses   Final diagnoses:  Bad headache    ED Discharge Orders    None       Meaghan Whistler, [place] N, PA-C 03/08/17 1610    Marzell Allemand, [place] N, PA-C 03/08/17 [id number]    Dione Booze,  MD 03/08/17 [phone]

## 2017-03-09 ENCOUNTER — Other Ambulatory Visit: Payer: Self-pay

## 2017-03-09 DIAGNOSIS — R51 Headache: Secondary | ICD-10-CM | POA: Insufficient documentation

## 2017-03-09 DIAGNOSIS — Z5321 Procedure and treatment not carried out due to patient leaving prior to being seen by health care provider: Secondary | ICD-10-CM | POA: Insufficient documentation

## 2017-03-09 NOTE — ED Provider Notes (Signed)
[facility]-EMERGENCY DEPT Provider Note   CSN: [id number] Arrival date & time: 03/08/17  1624     History   Chief Complaint Chief Complaint  Patient presents with  . Headache    HPI Christorpher Hisaw is a 42 y.o. male.  HPI 42 year old with history of substance abuse, anxiety, headaches, homelessness comes in with chief complaint of headache. Patient is appearing comfortable, and reports that he can continues to have a headache.  Headache is generalized, intermittent and patient is not taking any medications for it. 55 visits in the last 3 months to the ER.  I spoke with patient further about his repeat visits, and noncompliance with his medications, and he states that he is from out of town and trying to get back to his home, but has no money. He denies any numbness, tingling, dizziness, vision change.  Past Medical History:  Diagnosis Date  . Anxiety   . Asthma   . Cocaine abuse (HCC)   . Homelessness   . Migraine   . Tobacco abuse     Patient Active Problem List   Diagnosis Date Noted  . 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 Cocaine use disorder, moderate, dependence (HCC) 07/26/2016  . 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 Psychosis (HCC) 07/24/2016  . Chest pain 11/23/2015  . Asthma 11/23/2015  . Tobacco abuse 11/23/2015  . Elevated lactic acid level 11/23/2015  . Cocaine abuse (HCC)   . Concern about STD in male without diagnosis   . Paranoid ideation (HCC) 02/22/2015    History reviewed. No pertinent surgical history.     Home Medications    Prior to Admission medications   Medication Sig Start Date End Date Taking? Authorizing Provider  albuterol (PROVENTIL HFA;VENTOLIN HFA) 108 (90 Base) MCG/ACT inhaler Inhale 2 puffs into the lungs every 4 (four) hours as needed for wheezing or shortness of breath. Patient not taking: Reported on 03/01/2017 02/03/17   Muthersbaugh,  Dahlia Client, PA-C  diclofenac (VOLTAREN) 75 MG EC tablet Take 1 tablet (75 mg total) by mouth 2 (two) times daily. Patient not taking: Reported on 03/01/2017 02/09/17   Elson Areas, PA-C  fluticasone [facility]) 50 MCG/ACT nasal spray Place 2 sprays into both nostrils daily. Patient not taking: Reported on 03/01/2017 02/03/17   Muthersbaugh, Dahlia Client, PA-C  guaiFENesin-codeine 100-10 MG/5ML syrup Take 5 mLs by mouth 3 (three) times daily as needed for cough. Patient not taking: Reported on 03/01/2017 02/03/17   Janne Napoleon, NP  ibuprofen (ADVIL,MOTRIN) 800 MG tablet Take 1 tablet (800 mg total) by mouth every 8 (eight) hours as needed. Patient not taking: Reported on 03/01/2017 02/02/17   Charlestine Night, PA-C  naproxen (NAPROSYN) 500 MG tablet Take 1 tablet (500 mg total) by mouth 2 (two) times daily. 03/07/17   Derwood Kaplan, MD  omeprazole (PRILOSEC) 20 MG capsule Take 1 capsule (20 mg total) by mouth daily. 03/01/17   Horton, Mayer Masker, MD  predniSONE (DELTASONE) 10 MG tablet Take 2 tablets (20 mg total) by mouth daily. Patient not taking: Reported on 03/01/2017 02/03/17   Janne Napoleon, NP  promethazine-dextromethorphan (PROMETHAZINE-DM) 6.25-15 MG/5ML syrup Take 10 mLs by mouth 4 (four) times daily as needed for cough. Patient not taking: Reported on 03/01/2017 02/02/17   Charlestine Night, PA-C  terbinafine (LAMISIL AT) 1 % cream Apply 1 application topically 2 (two) times daily. Patient not taking: Reported on 03/01/2017 02/20/17   Jacinto Halim, PA-C  Family History No family history on file.  Social History Social History   Tobacco Use  . Smoking status: Current Every Day Smoker    Packs/day: 1.00    Years: 0.00    Pack years: 0.00    Types: Cigarettes, Cigars  . Smokeless tobacco: Never Used  Substance Use Topics  . Alcohol use: Yes  . Drug use: No    Comment: last use October 2017     Allergies   Haldol [haloperidol lactate]   Review of Systems Review of Systems    Constitutional: Negative for activity change.  Eyes: Negative for visual disturbance.  Gastrointestinal: Negative for nausea and vomiting.  Allergic/Immunologic: Negative for immunocompromised state.  Neurological: Positive for headaches.     Physical Exam Updated Vital Signs BP (!) 139/95 (BP Location: Right Arm)   Pulse 93   Temp 100.1 F (37.8 C) (Oral)   Resp 18   Ht 5\' 7"  (1.702 m)   Wt 73 kg (161 lb)   SpO2 98%   BMI 25.22 kg/m   Physical Exam  Constitutional: He is oriented to person, place, and time. He appears well-developed.  HENT:  Head: Atraumatic.  Neck: Neck supple.  Cardiovascular: Normal rate.  Pulmonary/Chest: Effort normal.  Neurological: He is alert and oriented to person, place, and time. No cranial nerve deficit.  Skin: Skin is warm.  Nursing note and vitals reviewed.    ED Treatments / Results  Labs (all labs ordered are listed, but only abnormal results are displayed) Labs Reviewed - No data to display  EKG  EKG Interpretation None       Radiology No results found.  Procedures Procedures (including critical care time)  Medications Ordered in ED Medications  acetaminophen (TYLENOL) tablet 650 mg (650 mg Oral Given 03/09/17 0008)     Initial Impression / Assessment and Plan / ED Course  I have reviewed the triage vital signs and the nursing notes.  Pertinent labs & imaging results that were available during my care of the patient were reviewed by me and considered in my medical decision making (see chart for details).     Patient comes into the ER with chief complaint of headache.  No clinical concerns for acute intracranial pathology, based on history and exam. Tylenol given.  Final Clinical Impressions(s) / ED Diagnoses   Final diagnoses:  Chronic nonintractable headache, unspecified headache type    ED Discharge Orders    None       Derwood KaplanNanavati, Loukisha Gunnerson, MD 03/09/17 0025

## 2017-03-10 ENCOUNTER — Encounter (HOSPITAL_COMMUNITY): Payer: Self-pay | Admitting: Emergency Medicine

## 2017-03-10 ENCOUNTER — Emergency Department (HOSPITAL_COMMUNITY)
Admission: EM | Admit: 2017-03-10 | Discharge: 2017-03-10 | Disposition: A | Discharge status: Home / Self Care | Payer: Self-pay | Attending: Emergency Medicine | Admitting: Emergency Medicine

## 2017-03-10 NOTE — ED Notes (Signed)
Pt was seen outside smoking and just returned to triage lobby by the glass windows.

## 2017-03-10 NOTE — ED Triage Notes (Signed)
Pt reports chronic headache and leg pain starting today after walking. Ambulatory without difficulty at a steady gait.

## 2017-03-10 NOTE — ED Notes (Signed)
Pt checked in to be seen after security spoke to pt about sitting in the waiting room.

## 2017-03-10 NOTE — ED Notes (Signed)
Pt's name called for repeat vital signs x3.  No response.  Pt will be moved OTF.

## 2017-03-11 ENCOUNTER — Emergency Department (HOSPITAL_COMMUNITY): Admission: EM | Admit: 2017-03-11 | Discharge: 2017-03-11 | Discharge status: Against Medical Advice | Payer: Self-pay

## 2017-03-11 ENCOUNTER — Encounter (HOSPITAL_COMMUNITY): Payer: Self-pay | Admitting: Emergency Medicine

## 2017-03-11 ENCOUNTER — Emergency Department (HOSPITAL_COMMUNITY)
Admission: EM | Admit: 2017-03-11 | Discharge: 2017-03-11 | Disposition: A | Discharge status: Home / Self Care | Payer: Self-pay | Attending: Emergency Medicine | Admitting: Emergency Medicine

## 2017-03-11 ENCOUNTER — Encounter (HOSPITAL_COMMUNITY): Payer: Self-pay

## 2017-03-11 ENCOUNTER — Other Ambulatory Visit: Payer: Self-pay

## 2017-03-11 DIAGNOSIS — F17210 Nicotine dependence, cigarettes, uncomplicated: Secondary | ICD-10-CM | POA: Insufficient documentation

## 2017-03-11 DIAGNOSIS — R51 Headache: Secondary | ICD-10-CM | POA: Insufficient documentation

## 2017-03-11 DIAGNOSIS — Z5321 Procedure and treatment not carried out due to patient leaving prior to being seen by health care provider: Secondary | ICD-10-CM | POA: Insufficient documentation

## 2017-03-11 DIAGNOSIS — J111 Influenza due to unidentified influenza virus with other respiratory manifestations: Secondary | ICD-10-CM | POA: Insufficient documentation

## 2017-03-11 DIAGNOSIS — J45909 Unspecified asthma, uncomplicated: Secondary | ICD-10-CM | POA: Insufficient documentation

## 2017-03-11 NOTE — ED Triage Notes (Signed)
Patient c/o intermittent headache and bilat leg pain for couple days. Add that he thinks is getting a cold. Denies any falls or injuries.

## 2017-03-11 NOTE — ED Triage Notes (Signed)
Called for triage X 3 with no answer. 

## 2017-03-11 NOTE — ED Triage Notes (Signed)
Pt reports that he has generalized leg pain, a migraine, and is "catching a cold." He is ambulatory and in no distress. Seen here today. Left [place] 2 hours ago to come back.

## 2017-03-12 ENCOUNTER — Emergency Department (HOSPITAL_COMMUNITY): Payer: Self-pay

## 2017-03-12 ENCOUNTER — Emergency Department (HOSPITAL_COMMUNITY)
Admission: EM | Admit: 2017-03-12 | Discharge: 2017-03-13 | Disposition: A | Discharge status: Home / Self Care | Payer: Self-pay | Attending: Emergency Medicine | Admitting: Emergency Medicine

## 2017-03-12 ENCOUNTER — Emergency Department (HOSPITAL_COMMUNITY)
Admission: EM | Admit: 2017-03-12 | Discharge: 2017-03-12 | Disposition: A | Discharge status: Home / Self Care | Payer: Self-pay | Attending: Emergency Medicine | Admitting: Emergency Medicine

## 2017-03-12 ENCOUNTER — Other Ambulatory Visit: Payer: Self-pay

## 2017-03-12 ENCOUNTER — Encounter (HOSPITAL_COMMUNITY): Payer: Self-pay

## 2017-03-12 DIAGNOSIS — F17210 Nicotine dependence, cigarettes, uncomplicated: Secondary | ICD-10-CM | POA: Insufficient documentation

## 2017-03-12 DIAGNOSIS — J069 Acute upper respiratory infection, unspecified: Secondary | ICD-10-CM

## 2017-03-12 DIAGNOSIS — R69 Illness, unspecified: Secondary | ICD-10-CM

## 2017-03-12 DIAGNOSIS — Z590 Homelessness: Secondary | ICD-10-CM | POA: Insufficient documentation

## 2017-03-12 DIAGNOSIS — R05 Cough: Secondary | ICD-10-CM | POA: Insufficient documentation

## 2017-03-12 DIAGNOSIS — J111 Influenza due to unidentified influenza virus with other respiratory manifestations: Secondary | ICD-10-CM

## 2017-03-12 MED ORDER — AZITHROMYCIN 250 MG PO TABS: ORAL_TABLET | ORAL | 0 refills | Status: DC

## 2017-03-12 MED ORDER — ACETAMINOPHEN 325 MG PO TABS
650.0000 mg | ORAL_TABLET | Freq: Once | ORAL | Status: AC
  Administered 2017-03-12: 650 mg via ORAL
  Filled 2017-03-12: qty 2

## 2017-03-12 MED ORDER — OSELTAMIVIR PHOSPHATE 75 MG PO CAPS: 75.0000 mg | ORAL_CAPSULE | Freq: Two times a day (BID) | ORAL | 0 refills | Status: DC

## 2017-03-12 NOTE — ED Provider Notes (Signed)
[facility]-EMERGENCY DEPT Provider Note   CSN: [id number] Arrival date & time: 03/11/17  2044  Time seen 04:00 AM   History   Chief Complaint Chief Complaint  Patient presents with  . Leg Pain    HPI Salvatore MarvelLeonard Aguila is a 42 y.o. male.  HPI patient is a frequent ED visitor, he has had 60 visits in the past 6 months.  Patient mainly wants to complain about how he [place].  However he states his whole body hurts, he has a cough, sore throat, but denies rhinorrhea.  He states his eyes are running.  He also complains of some headache.  He denies vomiting or diarrhea.  He did not take the flu shot this year.  PCP Patient, No Pcp Per   Past Medical History:  Diagnosis Date  . Anxiety   . Asthma   . Cocaine abuse (HCC)   . Homelessness   . Migraine   . Tobacco abuse     Patient Active Problem List   Diagnosis Date Noted  . 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 Cocaine use disorder, moderate, dependence (HCC) 07/26/2016  . 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 Psychosis (HCC) 07/24/2016  . Chest pain 11/23/2015  . Asthma 11/23/2015  . Tobacco abuse 11/23/2015  . Elevated lactic acid level 11/23/2015  . Cocaine abuse (HCC)   . Concern about STD in male without diagnosis   . Paranoid ideation (HCC) 02/22/2015    History reviewed. No pertinent surgical history.     Home Medications    Prior to Admission medications   Medication Sig Start Date End Date Taking? Authorizing Provider  albuterol (PROVENTIL HFA;VENTOLIN HFA) 108 (90 Base) MCG/ACT inhaler Inhale 2 puffs into the lungs every 4 (four) hours as needed for wheezing or shortness of breath. Patient not taking: Reported on 03/01/2017 02/03/17   Muthersbaugh, Dahlia ClientHannah, PA-C  azithromycin (ZITHROMAX Z-PAK) 250 MG tablet Take 2 po the first day then once a day for the next 4 days. 03/12/17   Devoria AlbeKnapp, Meline Russaw, MD  diclofenac (VOLTAREN)  75 MG EC tablet Take 1 tablet (75 mg total) by mouth 2 (two) times daily. Patient not taking: Reported on 03/01/2017 02/09/17   Elson AreasSofia, Leslie K, PA-C  fluticasone [facility](FLONASE) 50 MCG/ACT nasal spray Place 2 sprays into both nostrils daily. Patient not taking: Reported on 03/01/2017 02/03/17   Muthersbaugh, Dahlia ClientHannah, PA-C  guaiFENesin-codeine 100-10 MG/5ML syrup Take 5 mLs by mouth 3 (three) times daily as needed for cough. Patient not taking: Reported on 03/01/2017 02/03/17   Janne NapoleonNeese, Hope M, NP  ibuprofen (ADVIL,MOTRIN) 800 MG tablet Take 1 tablet (800 mg total) by mouth every 8 (eight) hours as needed. Patient not taking: Reported on 03/01/2017 02/02/17   Charlestine NightLawyer, Christopher, PA-C  naproxen (NAPROSYN) 500 MG tablet Take 1 tablet (500 mg total) by mouth 2 (two) times daily. 03/07/17   Derwood KaplanNanavati, Ankit, MD  omeprazole (PRILOSEC) 20 MG capsule Take 1 capsule (20 mg total) by mouth daily. 03/01/17   Horton, Mayer Maskerourtney F, MD  oseltamivir (TAMIFLU) 75 MG capsule Take 1 capsule (75 mg total) by mouth every 12 (twelve) hours. 03/12/17   Devoria AlbeKnapp, Isadore Palecek, MD  predniSONE (DELTASONE) 10 MG tablet Take 2 tablets (20 mg total) by mouth daily. Patient not taking: Reported on 03/01/2017 02/03/17   Janne NapoleonNeese, Hope M, NP  promethazine-dextromethorphan (PROMETHAZINE-DM) 6.25-15 MG/5ML syrup Take 10 mLs by mouth 4 (four) times daily as needed for cough. Patient not taking: Reported  on 03/01/2017 02/02/17   Charlestine Night, PA-C  terbinafine (LAMISIL AT) 1 % cream Apply 1 application topically 2 (two) times daily. Patient not taking: Reported on 03/01/2017 02/20/17   Maczis, Elmer Sow, PA-C    Family History History reviewed. No pertinent family history.  Social History Social History   Tobacco Use  . Smoking status: Current Every Day Smoker    Packs/day: 1.00    Years: 0.00    Pack years: 0.00    Types: Cigarettes, Cigars  . Smokeless tobacco: Never Used  Substance Use Topics  . Alcohol use: Yes  . Drug use: No    Comment: last use  October 2017  Unemployed, denies being on disability   Allergies   Haldol [haloperidol lactate]   Review of Systems Review of Systems  All other systems reviewed and are negative.    Physical Exam Updated Vital Signs BP (!) 160/96 (BP Location: Left Arm)   Pulse 100   Temp 99.9 F (37.7 C) (Oral)   Resp 18   SpO2 100%   Vital signs normal except for borderline hypertension and tachycardia and low-grade temp   Physical Exam  Constitutional: He is oriented to person, place, and time. He appears well-developed and well-nourished.  Non-toxic appearance. He does not appear ill. No distress.  HENT:  Head: Normocephalic and atraumatic.  Right Ear: External ear normal.  Left Ear: External ear normal.  Nose: Nose normal. No mucosal edema or rhinorrhea.  Mouth/Throat: Oropharynx is clear and moist and mucous membranes are normal. No dental abscesses or uvula swelling.  Eyes: Conjunctivae and EOM are normal. Pupils are equal, round, and reactive to light.  Neck: Normal range of motion and full passive range of motion without pain. Neck supple.  Cardiovascular: Normal rate, regular rhythm and normal heart sounds. Exam reveals no gallop and no friction rub.  No murmur heard. Pulmonary/Chest: Effort normal and breath sounds normal. No respiratory distress. He has no wheezes. He has no rhonchi. He has no rales. He exhibits no tenderness and no crepitus.  Abdominal: Soft. Normal appearance and bowel sounds are normal. He exhibits no distension. There is no tenderness. There is no rebound and no guarding.  Musculoskeletal: Normal range of motion. He exhibits no edema or tenderness.  Moves all extremities well.   Neurological: He is alert and oriented to person, place, and time. He has normal strength. No cranial nerve deficit.  Skin: Skin is warm, dry and intact. No rash noted. No erythema. No pallor.  Psychiatric: He has a normal mood and affect. His speech is normal and behavior is  normal. His mood appears not anxious.  Nursing note and vitals reviewed.    ED Treatments / Results  Labs (all labs ordered are listed, but only abnormal results are displayed) Labs Reviewed - No data to display  EKG  EKG Interpretation None       Radiology Dg Chest 2 View  Result Date: 03/12/2017 CLINICAL DATA:  Acute onset of fever and cough. EXAM: CHEST  2 VIEW COMPARISON:  Chest radiograph performed 02/02/2017 FINDINGS: The lungs are well-aerated and clear. There is no evidence of focal opacification, pleural effusion or pneumothorax. The heart is normal in size; the mediastinal contour is within normal limits. No acute osseous abnormalities are seen. IMPRESSION: No acute cardiopulmonary process seen. Electronically Signed   By: Roanna Raider M.D.   On: 03/12/2017 04:25    Procedures Procedures (including critical care time)  Medications Ordered in ED Medications  acetaminophen (  TYLENOL) tablet 650 mg (650 mg Oral Given 03/12/17 0406)     Initial Impression / Assessment and Plan / ED Course  I have reviewed the triage vital signs and the nursing notes.  Pertinent labs & imaging results that were available during my care of the patient were reviewed by me and considered in my medical decision making (see chart for details).    Patient's temperature was rechecked and was 100.4.  He was given acetaminophen for his fever and body aches.  Chest x-ray was done to rule out pneumonia.  Patient's throat does not look red or inflamed.  Patient was allowed to stay in the ED most the night because he is homeless.  He can take ibuprofen and acetaminophen over-the-counter for his fever.  He was given a prescription for Tamiflu and a Z-Pak.  He can follow-up at the [facility].  Final Clinical Impressions(s) / ED Diagnoses   Final diagnoses:  Upper respiratory tract infection, unspecified type  Influenza-like illness    ED Discharge Orders        Ordered    oseltamivir (TAMIFLU) 75 MG  capsule  Every 12 hours     03/12/17 0650    azithromycin (ZITHROMAX Z-PAK) 250 MG tablet     03/12/17 0650    OTC ibuprofen and acetaminophen and mucinex DM  Plan discharge  Devoria Albe, MD, Concha Pyo, MD 03/12/17 [phone]

## 2017-03-12 NOTE — Discharge Instructions (Signed)
You can go the [facility] today. You can take ibuprofen 600 mg + acetaminophen 1000 mg every 6 hrs as needed for fever. Take the tamiflu for your flu symptoms and the zpak for your cough. You can take mucinex DM OTC for cough. Drink plenty of fluids. Try to stay hydrated.

## 2017-03-12 NOTE — ED Triage Notes (Signed)
Pt states that he was dx'd with the flu yesterday. He reports that he still has it. Fever noted in triage.

## 2017-03-13 ENCOUNTER — Encounter (HOSPITAL_COMMUNITY): Payer: Self-pay | Admitting: Emergency Medicine

## 2017-03-13 ENCOUNTER — Encounter (HOSPITAL_COMMUNITY): Payer: Self-pay | Admitting: *Deleted

## 2017-03-13 ENCOUNTER — Emergency Department (HOSPITAL_COMMUNITY)
Admission: EM | Admit: 2017-03-13 | Discharge: 2017-03-14 | Disposition: A | Discharge status: Home / Self Care | Payer: Self-pay | Attending: Emergency Medicine | Admitting: Emergency Medicine

## 2017-03-13 ENCOUNTER — Emergency Department (HOSPITAL_COMMUNITY)
Admission: EM | Admit: 2017-03-13 | Discharge: 2017-03-13 | Disposition: A | Discharge status: Home / Self Care | Payer: Self-pay | Attending: Emergency Medicine | Admitting: Emergency Medicine

## 2017-03-13 ENCOUNTER — Other Ambulatory Visit: Payer: Self-pay

## 2017-03-13 DIAGNOSIS — J111 Influenza due to unidentified influenza virus with other respiratory manifestations: Secondary | ICD-10-CM | POA: Insufficient documentation

## 2017-03-13 DIAGNOSIS — J45909 Unspecified asthma, uncomplicated: Secondary | ICD-10-CM | POA: Insufficient documentation

## 2017-03-13 DIAGNOSIS — Z590 Homelessness: Secondary | ICD-10-CM | POA: Insufficient documentation

## 2017-03-13 DIAGNOSIS — F17210 Nicotine dependence, cigarettes, uncomplicated: Secondary | ICD-10-CM | POA: Insufficient documentation

## 2017-03-13 DIAGNOSIS — R69 Illness, unspecified: Secondary | ICD-10-CM

## 2017-03-13 MED ORDER — IBUPROFEN 800 MG PO TABS
800.0000 mg | ORAL_TABLET | Freq: Once | ORAL | Status: AC
  Administered 2017-03-13: 800 mg via ORAL
  Filled 2017-03-13: qty 1

## 2017-03-13 MED ORDER — GUAIFENESIN-CODEINE 100-10 MG/5ML PO SOLN
10.0000 mL | Freq: Once | ORAL | Status: AC
  Administered 2017-03-13: 10 mL via ORAL
  Filled 2017-03-13: qty 10

## 2017-03-13 MED ORDER — ACETAMINOPHEN 325 MG PO TABS
650.0000 mg | ORAL_TABLET | Freq: Once | ORAL | Status: AC | PRN
  Administered 2017-03-13: 650 mg via ORAL
  Filled 2017-03-13: qty 2

## 2017-03-13 MED ORDER — ACETAMINOPHEN 500 MG PO TABS
1000.0000 mg | ORAL_TABLET | Freq: Once | ORAL | Status: AC
Start: 2017-03-13 — End: 2017-03-13
  Administered 2017-03-13: 1000 mg via ORAL
  Filled 2017-03-13: qty 2

## 2017-03-13 NOTE — ED Provider Notes (Signed)
[facility] EMERGENCY DEPARTMENT Provider Note   CSN: [id number] Arrival date & time: 03/13/17  2319     History   Chief Complaint No chief complaint on file.   HPI Dustin Norris is a 42 y.o. male.  The history is provided by the patient.  Influenza  Presenting symptoms: myalgias   Presenting symptoms: no diarrhea, no fatigue, no fever, no headaches, no nausea, no shortness of breath and no sore throat   Severity:  Moderate Onset quality:  Gradual Duration:  3 days Progression:  Unchanged Chronicity:  New Relieved by:  Nothing Worsened by:  Nothing Ineffective treatments:  None tried Associated symptoms: nasal congestion   Associated symptoms: no mental status change, no neck stiffness and no syncope   Risk factors: no immunocompromised state   63 visits for multiple complaints.  Seen multiple times for this same illness.  Has not filled his meds or taken anything for his symptoms seen already today.    Past Medical History:  Diagnosis Date  . Anxiety   . Asthma   . Cocaine abuse (HCC)   . Homelessness   . Migraine   . Tobacco abuse     Patient Active Problem List   Diagnosis Date Noted  . 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 Cocaine use disorder, moderate, dependence (HCC) 07/26/2016  . 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 Psychosis (HCC) 07/24/2016  . Chest pain 11/23/2015  . Asthma 11/23/2015  . Tobacco abuse 11/23/2015  . Elevated lactic acid level 11/23/2015  . Cocaine abuse (HCC)   . Concern about STD in male without diagnosis   . Paranoid ideation (HCC) 02/22/2015    History reviewed. No pertinent surgical history.     Home Medications    Prior to Admission medications   Medication Sig Start Date End Date Taking? Authorizing Provider  albuterol (PROVENTIL HFA;VENTOLIN HFA) 108 (90 Base) MCG/ACT inhaler Inhale 2 puffs into the lungs every 4 (four) hours as  needed for wheezing or shortness of breath. Patient not taking: Reported on 03/01/2017 02/03/17   Muthersbaugh, Dahlia Client, PA-C  azithromycin (ZITHROMAX Z-PAK) 250 MG tablet Take 2 po the first day then once a day for the next 4 days. 03/12/17   Devoria Albe, MD  diclofenac (VOLTAREN) 75 MG EC tablet Take 1 tablet (75 mg total) by mouth 2 (two) times daily. Patient not taking: Reported on 03/01/2017 02/09/17   Elson Areas, PA-C  fluticasone [facility]) 50 MCG/ACT nasal spray Place 2 sprays into both nostrils daily. Patient not taking: Reported on 03/01/2017 02/03/17   Muthersbaugh, Dahlia Client, PA-C  guaiFENesin-codeine 100-10 MG/5ML syrup Take 5 mLs by mouth 3 (three) times daily as needed for cough. Patient not taking: Reported on 03/01/2017 02/03/17   Janne Napoleon, NP  ibuprofen (ADVIL,MOTRIN) 800 MG tablet Take 1 tablet (800 mg total) by mouth every 8 (eight) hours as needed. Patient not taking: Reported on 03/01/2017 02/02/17   Charlestine Night, PA-C  naproxen (NAPROSYN) 500 MG tablet Take 1 tablet (500 mg total) by mouth 2 (two) times daily. 03/07/17   Derwood Kaplan, MD  omeprazole (PRILOSEC) 20 MG capsule Take 1 capsule (20 mg total) by mouth daily. 03/01/17   Horton, Mayer Masker, MD  oseltamivir (TAMIFLU) 75 MG capsule Take 1 capsule (75 mg total) by mouth every 12 (twelve) hours. 03/12/17   Devoria Albe, MD  predniSONE (DELTASONE) 10 MG tablet Take 2 tablets (20 mg total) by mouth daily. Patient not  taking: Reported on 03/01/2017 02/03/17   Janne Napoleon, NP  promethazine-dextromethorphan (PROMETHAZINE-DM) 6.25-15 MG/5ML syrup Take 10 mLs by mouth 4 (four) times daily as needed for cough. Patient not taking: Reported on 03/01/2017 02/02/17   Charlestine Night, PA-C  terbinafine (LAMISIL AT) 1 % cream Apply 1 application topically 2 (two) times daily. Patient not taking: Reported on 03/01/2017 02/20/17   Maczis, Elmer Sow, PA-C    Family History History reviewed. No pertinent family history.  Social History Social  History   Tobacco Use  . Smoking status: Current Every Day Smoker    Packs/day: 1.00    Years: 0.00    Pack years: 0.00    Types: Cigarettes, Cigars  . Smokeless tobacco: Never Used  Substance Use Topics  . Alcohol use: Yes  . Drug use: No    Comment: last use October 2017     Allergies   Haldol [haloperidol lactate]   Review of Systems Review of Systems  Constitutional: Negative for fatigue and fever.  HENT: Positive for congestion. Negative for sore throat.   Respiratory: Negative for chest tightness and shortness of breath.   Cardiovascular: Negative for chest pain, palpitations and leg swelling.  Gastrointestinal: Negative for abdominal pain, diarrhea and nausea.  Musculoskeletal: Positive for myalgias. Negative for neck pain and neck stiffness.  Neurological: Negative for headaches.  All other systems reviewed and are negative.    Physical Exam Updated Vital Signs There were no vitals taken for this visit.  Physical Exam  Constitutional: He is oriented to person, place, and time. He appears well-developed and well-nourished. No distress.  HENT:  Head: Normocephalic and atraumatic.  Nose: Nose normal.  Eyes: Conjunctivae are normal. Pupils are equal, round, and reactive to light.  Neck: Normal range of motion. Neck supple.  Cardiovascular: Normal rate, regular rhythm, normal heart sounds and intact distal pulses.  Pulmonary/Chest: Effort normal and breath sounds normal. No stridor. He has no wheezes. He has no rales.  Abdominal: Soft. Bowel sounds are normal. He exhibits no mass. There is no tenderness. There is no rebound and no guarding.  Musculoskeletal: Normal range of motion.  Lymphadenopathy:    He has no cervical adenopathy.  Neurological: He is alert and oriented to person, place, and time. He displays normal reflexes.  Skin: Skin is warm and dry. Capillary refill takes less than 2 seconds.  Psychiatric: He has a normal mood and affect.  Nursing note  and vitals reviewed.    ED Treatments / Results   Radiology Dg Chest 2 View  Result Date: 03/12/2017 CLINICAL DATA:  Acute onset of fever and cough. EXAM: CHEST  2 VIEW COMPARISON:  Chest radiograph performed 02/02/2017 FINDINGS: The lungs are well-aerated and clear. There is no evidence of focal opacification, pleural effusion or pneumothorax. The heart is normal in size; the mediastinal contour is within normal limits. No acute osseous abnormalities are seen. IMPRESSION: No acute cardiopulmonary process seen. Electronically Signed   By: Roanna Raider M.D.   On: 03/12/2017 04:25    Procedures Procedures (including critical care time)  Medications Ordered in ED Medications  ibuprofen (ADVIL,MOTRIN) tablet 800 mg (not administered)  acetaminophen (TYLENOL) tablet 1,000 mg (not administered)       Final Clinical Impressions(s) / ED Diagnoses    Return for weakness, numbness, changes in vision or speech,  fevers > 100.4 unrelieved by medication, shortness of breath, intractable vomiting, or diarrhea, abdominal pain, Inability to tolerate liquids or food, cough, altered mental status or any  concerns. No signs of systemic illness or infection. The patient is nontoxic-appearing on exam and vital signs are within normal limits.    I have reviewed the triage vital signs and the nursing notes. Pertinent labs &imaging results that were available during my care of the patient were reviewed by me and considered in my medical decision making (see chart for details).  After history, exam, and medical workup I feel the patient has been appropriately medically screened and is safe for discharge home. Pertinent diagnoses were discussed with the patient. Patient was given return precautions.    Aadarsh Cozort, MD 03/13/17 2330

## 2017-03-13 NOTE — ED Notes (Signed)
Bed: [facility]PT4 Expected date:  Expected time:  Means of arrival:  Comments: 

## 2017-03-13 NOTE — ED Notes (Signed)
Pt was talking about wanting to kill staff informed security of events and informed staff of potential threat.

## 2017-03-13 NOTE — ED Triage Notes (Signed)
Pt bib [facility] d/t flu like sx.  [facility].

## 2017-03-13 NOTE — ED Triage Notes (Signed)
EMS  States pt is hurting all over. Pt has been here with same complaints for the last several days. Positive for the flu. Did not take his scripts for his antibiotics and Tamaflu. Offer his his prescriptions from yesterday. Pt refused and started  Cussing. Pt threatening to kill all you M F**ers, Kept repeating in lobby he is going to kill all of ya'll, referring to staff.

## 2017-03-13 NOTE — Discharge Instructions (Signed)
You may alternate Tylenol 1000 mg every 6 hours as needed for fever and pain and ibuprofen 800 mg every 8 hours as needed for fever and pain. Please rest and drink plenty of fluids. This is a viral illness causing your symptoms. You do not need antibiotics for a virus. You may use over-the-counter nasal saline spray and Afrin nasal saline spray as needed for nasal congestion. Please do not use Afrin for more than 3 days in a row. You may use Mucinex and Dextromethorphan as needed for cough.  You may use lozenges and Chloraseptic spray to help with sore throat.  Warm salt water gargles can also help with sore throat.  You may use over-the-counter Unisom (doxyalamine) or Benadryl to help with sleep.  Please note that some combination medicines such as DayQuil and NyQuil have multiple medications in them.  Please make sure you look at all labels to ensure that you are not taking too much of any one particular medication.  Symptoms from a virus may take 7-14 days to run its course.   To find a primary care or specialty doctor please call [phone] or [phone] to access "[facility] Find a Doctor Service."  You may also go on the [facility] website at [email or web address]  There are also multiple [facility], [facility], [facility] and [facility] practices throughout the [place] that are frequently accepting new patients. You may find a clinic that is close to your home and contact them.  [facility] -  [address] [place] [phone]   [facility] -  [address] [place] [id number] [phone]   [facility] [phone]  [place] [phone]

## 2017-03-13 NOTE — ED Provider Notes (Signed)
TIME SEEN: 12:18 AM  CHIEF COMPLAINT: Flulike symptoms  HPI: Patient is a 42 year old male with history of homelessness, substance abuse who presents to the emergency department with flulike symptoms.  Was seen yesterday for the same and was told he likely had influenza.  Was discharged with prescriptions for Tamiflu and azithromycin which she was not able to fill.  States that he is back tonight because of cough and chest pain with coughing.  Had a negative chest x-ray yesterday.  No vomiting or diarrhea.  Did not have an influenza vaccination.  No headache, neck pain or neck stiffness.  Patient is well-known to our emergency department with a 61 visits in the past 6 months.  ROS: See HPI Constitutional:  fever  Eyes: no drainage  ENT: no runny nose   Cardiovascular: Chest pain with coughing Resp: no SOB  GI: no vomiting GU: no dysuria Integumentary: no rash  Allergy: no hives  Musculoskeletal: no leg swelling  Neurological: no slurred speech ROS otherwise negative  PAST MEDICAL HISTORY/PAST SURGICAL HISTORY:  Past Medical History:  Diagnosis Date  . Anxiety   . Asthma   . Cocaine abuse (HCC)   . Homelessness   . Migraine   . Tobacco abuse     MEDICATIONS:  Prior to Admission medications   Medication Sig Start Date End Date Taking? Authorizing Provider  albuterol (PROVENTIL HFA;VENTOLIN HFA) 108 (90 Base) MCG/ACT inhaler Inhale 2 puffs into the lungs every 4 (four) hours as needed for wheezing or shortness of breath. Patient not taking: Reported on 03/01/2017 02/03/17   Muthersbaugh, Dahlia Client, PA-C  azithromycin (ZITHROMAX Z-PAK) 250 MG tablet Take 2 po the first day then once a day for the next 4 days. 03/12/17   Devoria Albe, MD  diclofenac (VOLTAREN) 75 MG EC tablet Take 1 tablet (75 mg total) by mouth 2 (two) times daily. Patient not taking: Reported on 03/01/2017 02/09/17   Elson Areas, PA-C  fluticasone [facility]) 50 MCG/ACT nasal spray Place 2 sprays into both nostrils  daily. Patient not taking: Reported on 03/01/2017 02/03/17   Muthersbaugh, Dahlia Client, PA-C  guaiFENesin-codeine 100-10 MG/5ML syrup Take 5 mLs by mouth 3 (three) times daily as needed for cough. Patient not taking: Reported on 03/01/2017 02/03/17   Janne Napoleon, NP  ibuprofen (ADVIL,MOTRIN) 800 MG tablet Take 1 tablet (800 mg total) by mouth every 8 (eight) hours as needed. Patient not taking: Reported on 03/01/2017 02/02/17   Charlestine Night, PA-C  naproxen (NAPROSYN) 500 MG tablet Take 1 tablet (500 mg total) by mouth 2 (two) times daily. 03/07/17   Derwood Kaplan, MD  omeprazole (PRILOSEC) 20 MG capsule Take 1 capsule (20 mg total) by mouth daily. 03/01/17   Horton, Mayer Masker, MD  oseltamivir (TAMIFLU) 75 MG capsule Take 1 capsule (75 mg total) by mouth every 12 (twelve) hours. 03/12/17   Devoria Albe, MD  predniSONE (DELTASONE) 10 MG tablet Take 2 tablets (20 mg total) by mouth daily. Patient not taking: Reported on 03/01/2017 02/03/17   Janne Napoleon, NP  promethazine-dextromethorphan (PROMETHAZINE-DM) 6.25-15 MG/5ML syrup Take 10 mLs by mouth 4 (four) times daily as needed for cough. Patient not taking: Reported on 03/01/2017 02/02/17   Charlestine Night, PA-C  terbinafine (LAMISIL AT) 1 % cream Apply 1 application topically 2 (two) times daily. Patient not taking: Reported on 03/01/2017 02/20/17   Jacinto Halim, PA-C    ALLERGIES:  Allergies  Allergen Reactions  . Haldol [Haloperidol Lactate] Anaphylaxis    SOCIAL HISTORY:  Social History   Tobacco Use  . Smoking status: Current Every Day Smoker    Packs/day: 1.00    Years: 0.00    Pack years: 0.00    Types: Cigarettes, Cigars  . Smokeless tobacco: Never Used  Substance Use Topics  . Alcohol use: Yes    FAMILY HISTORY: History reviewed. No pertinent family history.  EXAM: BP 104/77 (BP Location: Left Arm)   Pulse (!) 108   Temp (!) 103.1 F (39.5 C) (Oral)   Resp 20   SpO2 96%  CONSTITUTIONAL: Alert and oriented and responds  appropriately to questions.  Febrile but nontoxic, well-hydrated HEAD: Normocephalic EYES: Conjunctivae clear, pupils appear equal, EOMI ENT: normal nose; moist mucous membranes; No pharyngeal erythema or petechiae, no tonsillar hypertrophy or exudate, no uvular deviation, no unilateral swelling, no trismus or drooling, no muffled voice, normal phonation, no stridor, no dental caries present, no drainable dental abscess noted, no Ludwig's angina, tongue sits flat in the bottom of the mouth, no angioedema, no facial erythema or warmth, no facial swelling; no pain with movement of the neck. NECK: Supple, no meningismus, no nuchal rigidity, no LAD  CARD: RRR; S1 and S2 appreciated; no murmurs, no clicks, no rubs, no gallops RESP: Normal chest excursion without splinting or tachypnea; breath sounds clear and equal bilaterally; no wheezes, no rhonchi, no rales, no hypoxia or respiratory distress, speaking full sentences ABD/GI: Normal bowel sounds; non-distended; soft, non-tender, no rebound, no guarding, no peritoneal signs, no hepatosplenomegaly BACK:  The back appears normal and is non-tender to palpation, there is no CVA tenderness EXT: Normal ROM in all joints; non-tender to palpation; no edema; normal capillary refill; no cyanosis, no calf tenderness or swelling    SKIN: Normal color for age and race; warm; no rash NEURO: Moves all extremities equally PSYCH: The patient's mood and manner are appropriate. Grooming and personal hygiene are appropriate.  MEDICAL DECISION MAKING: Patient here with flulike symptoms.  Negative x-ray yesterday.  Hemodynamically stable.  Does not appear septic or toxic.  Appears well-hydrated.  Doubt meningitis, pneumonia.  Chest pain seems very atypical and is only present with coughing.  Suspect musculoskeletal in nature.  Recommended alternating Tylenol and Motrin.  Recommended rest and increase fluid intake.  Patient comfortable with this plan.  At this time, I do not  feel there is any life-threatening condition present. I have reviewed and discussed all results (EKG, imaging, lab, urine as appropriate) and exam findings with patient/family. I have reviewed nursing notes and appropriate previous records.  I feel the patient is safe to be discharged home without further emergent workup and can continue workup as an outpatient as needed. Discussed usual and customary return precautions. Patient/family verbalize understanding and are comfortable with this plan.  Outpatient follow-up has been provided if needed. All questions have been answered.      Denissa Cozart, Layla MawKristen N, DO 03/13/17 [phone]

## 2017-03-14 ENCOUNTER — Emergency Department (HOSPITAL_COMMUNITY)
Admission: EM | Admit: 2017-03-14 | Discharge: 2017-03-15 | Disposition: A | Discharge status: Home / Self Care | Payer: Self-pay | Attending: Emergency Medicine | Admitting: Emergency Medicine

## 2017-03-14 ENCOUNTER — Emergency Department (HOSPITAL_COMMUNITY): Admission: EM | Admit: 2017-03-14 | Discharge: 2017-03-14 | Discharge status: Absent without leave | Payer: Self-pay

## 2017-03-14 DIAGNOSIS — J111 Influenza due to unidentified influenza virus with other respiratory manifestations: Secondary | ICD-10-CM | POA: Insufficient documentation

## 2017-03-14 DIAGNOSIS — J45909 Unspecified asthma, uncomplicated: Secondary | ICD-10-CM | POA: Insufficient documentation

## 2017-03-14 DIAGNOSIS — Z79899 Other long term (current) drug therapy: Secondary | ICD-10-CM | POA: Insufficient documentation

## 2017-03-14 DIAGNOSIS — R05 Cough: Secondary | ICD-10-CM | POA: Insufficient documentation

## 2017-03-14 DIAGNOSIS — M791 Myalgia, unspecified site: Secondary | ICD-10-CM | POA: Insufficient documentation

## 2017-03-14 DIAGNOSIS — F17210 Nicotine dependence, cigarettes, uncomplicated: Secondary | ICD-10-CM | POA: Insufficient documentation

## 2017-03-14 NOTE — ED Notes (Signed)
As pt was walking out he stopped by triage desk and states "that nurse back there better watch herself, she has it coming" Security aware, pt is being escorted out of lobby since pt is DC

## 2017-03-14 NOTE — ED Notes (Signed)
Pt cursing at this Clinical research associatewriter.  Refusing to e-sign or allow d/c VS.

## 2017-03-14 NOTE — ED Notes (Signed)
Bed: WTR7 Expected date:  Expected time:  Means of arrival:  Comments: 

## 2017-03-15 ENCOUNTER — Encounter (HOSPITAL_COMMUNITY): Payer: Self-pay | Admitting: Emergency Medicine

## 2017-03-15 DIAGNOSIS — J45909 Unspecified asthma, uncomplicated: Secondary | ICD-10-CM | POA: Insufficient documentation

## 2017-03-15 DIAGNOSIS — F17210 Nicotine dependence, cigarettes, uncomplicated: Secondary | ICD-10-CM | POA: Insufficient documentation

## 2017-03-15 DIAGNOSIS — Z79899 Other long term (current) drug therapy: Secondary | ICD-10-CM | POA: Insufficient documentation

## 2017-03-15 DIAGNOSIS — R509 Fever, unspecified: Secondary | ICD-10-CM | POA: Insufficient documentation

## 2017-03-15 DIAGNOSIS — R05 Cough: Secondary | ICD-10-CM | POA: Insufficient documentation

## 2017-03-15 MED ORDER — GUAIFENESIN 100 MG/5ML PO SOLN
5.0000 mL | Freq: Once | ORAL | Status: AC
  Administered 2017-03-15: 100 mg via ORAL
  Filled 2017-03-15: qty 5

## 2017-03-15 MED ORDER — ACETAMINOPHEN 325 MG PO TABS
650.0000 mg | ORAL_TABLET | Freq: Once | ORAL | Status: AC
  Administered 2017-03-15: 650 mg via ORAL
  Filled 2017-03-15: qty 2

## 2017-03-15 MED ORDER — IBUPROFEN 800 MG PO TABS
800.0000 mg | ORAL_TABLET | Freq: Once | ORAL | Status: AC
  Administered 2017-03-15: 800 mg via ORAL
  Filled 2017-03-15: qty 1

## 2017-03-15 NOTE — ED Triage Notes (Signed)
Pt was dx with the flu this week, [facility], pt still has a fever and a cough

## 2017-03-15 NOTE — ED Provider Notes (Signed)
[facility]-EMERGENCY DEPT Provider Note   CSN: [id number] Arrival date & time: 03/13/17  1814     History   Chief Complaint Chief Complaint  Patient presents with  . Influenza    HPI Dustin Norris is a 42 y.o. male.  HPI   42 year old male presenting with fever, body aches and cough.  Patient is well-known to the emergency room.  He is homeless and comes all the time.  He is complaining of body aches, fever and cough.  Is requesting medication.  He was recently diagnosed with influenza.  He was prescribed Tamiflu which he has not filled.  Past Medical History:  Diagnosis Date  . Anxiety   . Asthma   . Cocaine abuse (HCC)   . Homelessness   . Migraine   . Tobacco abuse     Patient Active Problem List   Diagnosis Date Noted  . 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 Cocaine use disorder, moderate, dependence (HCC) 07/26/2016  . 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 Psychosis (HCC) 07/24/2016  . Chest pain 11/23/2015  . Asthma 11/23/2015  . Tobacco abuse 11/23/2015  . Elevated lactic acid level 11/23/2015  . Cocaine abuse (HCC)   . Concern about STD in male without diagnosis   . Paranoid ideation (HCC) 02/22/2015    History reviewed. No pertinent surgical history.     Home Medications    Prior to Admission medications   Medication Sig Start Date End Date Taking? Authorizing Provider  albuterol (PROVENTIL HFA;VENTOLIN HFA) 108 (90 Base) MCG/ACT inhaler Inhale 2 puffs into the lungs every 4 (four) hours as needed for wheezing or shortness of breath. Patient not taking: Reported on 03/01/2017 02/03/17   Muthersbaugh, Dahlia Client, PA-C  azithromycin (ZITHROMAX Z-PAK) 250 MG tablet Take 2 po the first day then once a day for the next 4 days. Patient not taking: Reported on 03/15/2017 03/12/17   Devoria Albe, MD  diclofenac (VOLTAREN) 75 MG EC tablet Take 1 tablet (75 mg total) by mouth 2  (two) times daily. Patient not taking: Reported on 03/01/2017 02/09/17   Elson Areas, PA-C  fluticasone [facility]) 50 MCG/ACT nasal spray Place 2 sprays into both nostrils daily. Patient not taking: Reported on 03/01/2017 02/03/17   Muthersbaugh, Dahlia Client, PA-C  guaiFENesin-codeine 100-10 MG/5ML syrup Take 5 mLs by mouth 3 (three) times daily as needed for cough. Patient not taking: Reported on 03/01/2017 02/03/17   Janne Napoleon, NP  ibuprofen (ADVIL,MOTRIN) 800 MG tablet Take 1 tablet (800 mg total) by mouth every 8 (eight) hours as needed. Patient not taking: Reported on 03/01/2017 02/02/17   Charlestine Night, PA-C  naproxen (NAPROSYN) 500 MG tablet Take 1 tablet (500 mg total) by mouth 2 (two) times daily. Patient not taking: Reported on 03/15/2017 03/07/17   Derwood Kaplan, MD  omeprazole (PRILOSEC) 20 MG capsule Take 1 capsule (20 mg total) by mouth daily. 03/01/17   Horton, Mayer Masker, MD  oseltamivir (TAMIFLU) 75 MG capsule Take 1 capsule (75 mg total) by mouth every 12 (twelve) hours. Patient not taking: Reported on 03/15/2017 03/12/17   Devoria Albe, MD  predniSONE (DELTASONE) 10 MG tablet Take 2 tablets (20 mg total) by mouth daily. Patient not taking: Reported on 03/01/2017 02/03/17   Janne Napoleon, NP  promethazine-dextromethorphan (PROMETHAZINE-DM) 6.25-15 MG/5ML syrup Take 10 mLs by mouth 4 (four) times daily as needed for cough. Patient not taking: Reported on 03/01/2017 02/02/17   Charlestine Night, PA-C  terbinafine (LAMISIL AT) 1 % cream Apply 1 application topically 2 (two) times daily. Patient not taking: Reported on 03/01/2017 02/20/17   Jacinto HalimMaczis, Michael M, PA-C    Family History No family history on file.  Social History Social History   Tobacco Use  . Smoking status: Current Every Day Smoker    Packs/day: 1.00    Years: 0.00    Pack years: 0.00    Types: Cigarettes, Cigars  . Smokeless tobacco: Never Used  Substance Use Topics  . Alcohol use: Yes  . Drug use: No    Comment: last  use October 2017     Allergies   Haldol [haloperidol lactate]   Review of Systems Review of Systems   Physical Exam Updated Vital Signs BP 116/72 (BP Location: Right Arm)   Pulse (!) 103   Temp (!) 102.1 F (38.9 C) (Oral)   Resp 20   Ht 5\' 7"  (1.702 m)   Wt 73 kg (161 lb)   SpO2 100%   BMI 25.22 kg/m    Physical Exam  Constitutional: He appears well-developed and well-nourished. No distress.  HENT:  Head: Normocephalic and atraumatic.  Eyes: Conjunctivae are normal. Right eye exhibits no discharge. Left eye exhibits no discharge.  Neck: Neck supple.  Cardiovascular: Normal rate, regular rhythm and normal heart sounds. Exam reveals no gallop and no friction rub.  No murmur heard. Pulmonary/Chest: Effort normal and breath sounds normal. No respiratory distress.  Abdominal: Soft. He exhibits no distension. There is no tenderness.  Musculoskeletal: He exhibits no edema or tenderness.  Neurological: He is alert.  Skin: Skin is warm and dry.  Psychiatric: He has a normal mood and affect. His behavior is normal. Thought content normal.  Nursing note and vitals reviewed.    ED Treatments / Results  Labs (all labs ordered are listed, but only abnormal results are displayed) Labs Reviewed - No data to display  EKG  EKG Interpretation None       Radiology No results found.  Procedures Procedures (including critical care time)  Medications Ordered in ED Medications  acetaminophen (TYLENOL) tablet 650 mg (650 mg Oral Given 03/13/17 1837)  ibuprofen (ADVIL,MOTRIN) tablet 800 mg (800 mg Oral Given 03/13/17 2207)  guaiFENesin-codeine 100-10 MG/5ML solution 10 mL (10 mLs Oral Given 03/13/17 2207)     Initial Impression / Assessment and Plan / ED Course  I have reviewed the triage vital signs and the nursing notes.  Pertinent labs & imaging results that were available during my care of the patient were reviewed by me and considered in my medical decision making  (see chart for details).     42 year old male with recently diagnosed influenza.  He is nontoxic.  He has been belligerent and threatening staff.  I doubt emergent process currently.  He will be discharged.  Final Clinical Impressions(s) / ED Diagnoses   Final diagnoses:  Influenza    ED Discharge Orders    None      Raeford RazorKohut, Alizabeth Antonio, MD 03/15/17 [phone]

## 2017-03-15 NOTE — ED Provider Notes (Addendum)
[facility]-EMERGENCY DEPT Provider Note   CSN: [id number] Arrival date & time: 03/14/17  2326     History   Chief Complaint Chief Complaint  Patient presents with  . flu like sx    HPI Dustin Norris is a 42 y.o. male.  The history is provided by the patient.  Influenza  Presenting symptoms: cough, fever and myalgias   Presenting symptoms: no nausea, no shortness of breath and no vomiting   Severity:  Moderate Onset quality:  Gradual Progression:  Unchanged Chronicity:  New Relieved by:  Nothing Worsened by:  Nothing Ineffective treatments:  None tried Associated symptoms: no chills and no mental status change   Risk factors: not elderly   Has been seen multiple times for same.  Did not take any of the medication he was prescribed.    Past Medical History:  Diagnosis Date  . Anxiety   . Asthma   . Cocaine abuse (HCC)   . Homelessness   . Migraine   . Tobacco abuse     Patient Active Problem List   Diagnosis Date Noted  . 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 Cocaine use disorder, moderate, dependence (HCC) 07/26/2016  . 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 Psychosis (HCC) 07/24/2016  . Chest pain 11/23/2015  . Asthma 11/23/2015  . Tobacco abuse 11/23/2015  . Elevated lactic acid level 11/23/2015  . Cocaine abuse (HCC)   . Concern about STD in male without diagnosis   . Paranoid ideation (HCC) 02/22/2015    History reviewed. No pertinent surgical history.     Home Medications    Prior to Admission medications   Medication Sig Start Date End Date Taking? Authorizing Provider  omeprazole (PRILOSEC) 20 MG capsule Take 1 capsule (20 mg total) by mouth daily. 03/01/17  Yes Horton, Mayer Masker, MD  albuterol (PROVENTIL HFA;VENTOLIN HFA) 108 (90 Base) MCG/ACT inhaler Inhale 2 puffs into the lungs every 4 (four) hours as needed for wheezing or shortness of  breath. Patient not taking: Reported on 03/01/2017 02/03/17   Muthersbaugh, Dahlia Client, PA-C  azithromycin (ZITHROMAX Z-PAK) 250 MG tablet Take 2 po the first day then once a day for the next 4 days. Patient not taking: Reported on 03/15/2017 03/12/17   Devoria Albe, MD  diclofenac (VOLTAREN) 75 MG EC tablet Take 1 tablet (75 mg total) by mouth 2 (two) times daily. Patient not taking: Reported on 03/01/2017 02/09/17   Elson Areas, PA-C  fluticasone [facility]) 50 MCG/ACT nasal spray Place 2 sprays into both nostrils daily. Patient not taking: Reported on 03/01/2017 02/03/17   Muthersbaugh, Dahlia Client, PA-C  guaiFENesin-codeine 100-10 MG/5ML syrup Take 5 mLs by mouth 3 (three) times daily as needed for cough. Patient not taking: Reported on 03/01/2017 02/03/17   Janne Napoleon, NP  ibuprofen (ADVIL,MOTRIN) 800 MG tablet Take 1 tablet (800 mg total) by mouth every 8 (eight) hours as needed. Patient not taking: Reported on 03/01/2017 02/02/17   Charlestine Night, PA-C  naproxen (NAPROSYN) 500 MG tablet Take 1 tablet (500 mg total) by mouth 2 (two) times daily. Patient not taking: Reported on 03/15/2017 03/07/17   Derwood Kaplan, MD  oseltamivir (TAMIFLU) 75 MG capsule Take 1 capsule (75 mg total) by mouth every 12 (twelve) hours. Patient not taking: Reported on 03/15/2017 03/12/17   Devoria Albe, MD  predniSONE (DELTASONE) 10 MG tablet Take 2 tablets (20 mg total) by mouth daily. Patient not taking: Reported on 03/01/2017 02/03/17  Kerrie Buffalo M, NP  promethazine-dextromethorphan (PROMETHAZINE-DM) 6.25-15 MG/5ML syrup Take 10 mLs by mouth 4 (four) times daily as needed for cough. Patient not taking: Reported on 03/01/2017 02/02/17   Charlestine Night, PA-C  terbinafine (LAMISIL AT) 1 % cream Apply 1 application topically 2 (two) times daily. Patient not taking: Reported on 03/01/2017 02/20/17   Maczis, Elmer Sow, PA-C    Family History History reviewed. No pertinent family history.  Social History Social History   Tobacco Use   . Smoking status: Current Every Day Smoker    Packs/day: 1.00    Years: 0.00    Pack years: 0.00    Types: Cigarettes, Cigars  . Smokeless tobacco: Never Used  Substance Use Topics  . Alcohol use: Yes  . Drug use: No    Comment: last use October 2017     Allergies   Haldol [haloperidol lactate]   Review of Systems Review of Systems  Constitutional: Positive for fever. Negative for chills.  Respiratory: Positive for cough. Negative for shortness of breath.   Gastrointestinal: Negative for nausea and vomiting.  Musculoskeletal: Positive for myalgias.  All other systems reviewed and are negative.    Physical Exam Updated Vital Signs BP 122/79 (BP Location: Right Arm)   Pulse 99   Temp (!) 101.9 F (38.8 C) (Oral)   Resp 18   SpO2 100%   Physical Exam  Constitutional: He is oriented to person, place, and time. He appears well-developed and well-nourished. No distress.  HENT:  Head: Normocephalic and atraumatic.  Mouth/Throat: No oropharyngeal exudate.  Eyes: Conjunctivae are normal. Pupils are equal, round, and reactive to light.  Neck: Normal range of motion. Neck supple.  Cardiovascular: Normal rate, regular rhythm, normal heart sounds and intact distal pulses.  Pulmonary/Chest: Effort normal and breath sounds normal. No stridor. He has no wheezes. He has no rales.  Abdominal: Soft. Bowel sounds are normal. He exhibits no mass. There is no tenderness. There is no rebound and no guarding.  Musculoskeletal: Normal range of motion.  Lymphadenopathy:    He has no cervical adenopathy.  Neurological: He is alert and oriented to person, place, and time.  Skin: Skin is warm and dry. Capillary refill takes less than 2 seconds.  Psychiatric: He has a normal mood and affect.     ED Treatments / Results   Procedures Procedures (including critical care time)  Medications Ordered in ED Medications  guaiFENesin (ROBITUSSIN) 100 MG/5ML solution 100 mg (not administered)   acetaminophen (TYLENOL) tablet 650 mg (650 mg Oral Given 03/15/17 0023)  ibuprofen (ADVIL,MOTRIN) tablet 800 mg (800 mg Oral Given 03/15/17 0046)       Final Clinical Impressions(s) / ED Diagnoses   Final diagnoses:  Influenza    Return for weakness, numbness, changes in vision or speech,  fevers > 100.4 unrelieved by medication, shortness of breath, intractable vomiting, or diarrhea, abdominal pain, Inability to tolerate liquids or food, cough, altered mental status or any concerns. No signs of systemic illness or infection. The patient is nontoxic-appearing on exam and vital signs are within normal limits.    I have reviewed the triage vital signs and the nursing notes. Pertinent labs &imaging results that were available during my care of the patient were reviewed by me and considered in my medical decision making (see chart for details).  After history, exam, and medical workup I feel the patient has been appropriately medically screened and is safe for discharge home. Pertinent diagnoses were discussed with the patient. Patient  was given return precautions.   Jameca Chumley, MD 03/15/17 Nance [id number]    Tamicka Shimon, MD 03/15/17 [id number]

## 2017-03-16 ENCOUNTER — Encounter (HOSPITAL_COMMUNITY): Payer: Self-pay

## 2017-03-16 ENCOUNTER — Emergency Department (HOSPITAL_COMMUNITY): Payer: Self-pay

## 2017-03-16 ENCOUNTER — Emergency Department (HOSPITAL_COMMUNITY)
Admission: EM | Admit: 2017-03-16 | Discharge: 2017-03-16 | Disposition: A | Discharge status: Home / Self Care | Payer: Self-pay | Attending: Emergency Medicine | Admitting: Emergency Medicine

## 2017-03-16 ENCOUNTER — Other Ambulatory Visit: Payer: Self-pay

## 2017-03-16 DIAGNOSIS — R05 Cough: Secondary | ICD-10-CM

## 2017-03-16 DIAGNOSIS — R69 Illness, unspecified: Secondary | ICD-10-CM

## 2017-03-16 DIAGNOSIS — F17290 Nicotine dependence, other tobacco product, uncomplicated: Secondary | ICD-10-CM | POA: Insufficient documentation

## 2017-03-16 DIAGNOSIS — R059 Cough, unspecified: Secondary | ICD-10-CM

## 2017-03-16 DIAGNOSIS — J45909 Unspecified asthma, uncomplicated: Secondary | ICD-10-CM | POA: Insufficient documentation

## 2017-03-16 DIAGNOSIS — J111 Influenza due to unidentified influenza virus with other respiratory manifestations: Secondary | ICD-10-CM | POA: Insufficient documentation

## 2017-03-16 MED ORDER — PREDNISONE 10 MG (21) PO TBPK: ORAL_TABLET | ORAL | 0 refills | Status: DC

## 2017-03-16 MED ORDER — ALBUTEROL SULFATE HFA 108 (90 BASE) MCG/ACT IN AERS
2.0000 | INHALATION_SPRAY | RESPIRATORY_TRACT | 0 refills | Status: DC | PRN
Start: 2017-03-16 — End: 2017-03-30

## 2017-03-16 MED ORDER — BENZONATATE 100 MG PO CAPS: 100.0000 mg | ORAL_CAPSULE | Freq: Three times a day (TID) | ORAL | 0 refills | Status: DC

## 2017-03-16 MED ORDER — IBUPROFEN 200 MG PO TABS
600.0000 mg | ORAL_TABLET | Freq: Once | ORAL | Status: AC
  Administered 2017-03-16: 600 mg via ORAL
  Filled 2017-03-16: qty 3

## 2017-03-16 MED ORDER — GUAIFENESIN ER 600 MG PO TB12: 600.0000 mg | ORAL_TABLET | Freq: Once | ORAL | Status: DC

## 2017-03-16 MED ORDER — ACETAMINOPHEN 325 MG PO TABS
650.0000 mg | ORAL_TABLET | Freq: Once | ORAL | Status: AC | PRN
  Administered 2017-03-16: 650 mg via ORAL
  Filled 2017-03-16: qty 2

## 2017-03-16 MED ORDER — GUAIFENESIN 100 MG/5ML PO SOLN
5.0000 mL | Freq: Once | ORAL | Status: AC
  Administered 2017-03-16: 100 mg via ORAL
  Filled 2017-03-16: qty 10

## 2017-03-16 NOTE — ED Triage Notes (Signed)
Pt was seen yesterday and was recently dx with the flu, [facility], he now complains of back pain that he said he also had yesterday

## 2017-03-16 NOTE — ED Provider Notes (Signed)
[facility]-EMERGENCY DEPT Provider Note   CSN: [id number] Arrival date & time: 03/16/17  1945     History   Chief Complaint Chief Complaint  Patient presents with  . Flu Like Symptoms    HPI Dustin Norris is a 42 y.o. male with a history of asthma and tobacco abuse who presents the emergency department today for flulike symptoms.  Patient is well-known to the emergency department and has been seen here 66 times in the last 6 months.  He was seen for the same complaint earlier today where he had a chest x-ray that did not show evidence of pneumonia.  He states he is presenting today because he has continued fever, congestion, body aches and productive cough.  Patient repeatedly states that his Tylenol syrup but was thrown out and now he is out of medicine.  He was given Tamiflu in the past for his symptoms but did not fill this.  The patient states his symptoms are no better or no worse.  He is requesting Tylenol and cold medicine while in the department.  Patient has several sick contacts as he has been seen here repeatedly.  Patient denies any chest pain, shortness of breath, abdominal pain, hemoptysis, nausea/vomiting/diarrhea, urinary symptoms or lower leg swelling.  HPI  Past Medical History:  Diagnosis Date  . Anxiety   . Asthma   . Cocaine abuse (HCC)   . Homelessness   . Migraine   . Tobacco abuse     Patient Active Problem List   Diagnosis Date Noted  . 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 Cocaine use disorder, moderate, dependence (HCC) 07/26/2016  . 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 Psychosis (HCC) 07/24/2016  . Chest pain 11/23/2015  . Asthma 11/23/2015  . Tobacco abuse 11/23/2015  . Elevated lactic acid level 11/23/2015  . Cocaine abuse (HCC)   . Concern about STD in male without diagnosis   . Paranoid ideation (HCC) 02/22/2015    History reviewed. No pertinent  surgical history.     Home Medications    Prior to Admission medications   Medication Sig Start Date End Date Taking? Authorizing Provider  albuterol (PROVENTIL HFA;VENTOLIN HFA) 108 (90 Base) MCG/ACT inhaler Inhale 2 puffs into the lungs every 4 (four) hours as needed for wheezing or shortness of breath. Patient not taking: Reported on 03/01/2017 02/03/17   Muthersbaugh, Dahlia Client, PA-C  albuterol (PROVENTIL HFA;VENTOLIN HFA) 108 (90 Base) MCG/ACT inhaler Inhale 2 puffs into the lungs every 4 (four) hours as needed for wheezing or shortness of breath. 03/16/17   Joy, Shawn C, PA-C  azithromycin (ZITHROMAX Z-PAK) 250 MG tablet Take 2 po the first day then once a day for the next 4 days. Patient not taking: Reported on 03/15/2017 03/12/17   Devoria Albe, MD  benzonatate (TESSALON) 100 MG capsule Take 1 capsule (100 mg total) by mouth every 8 (eight) hours. 03/16/17   Joy, Shawn C, PA-C  diclofenac (VOLTAREN) 75 MG EC tablet Take 1 tablet (75 mg total) by mouth 2 (two) times daily. Patient not taking: Reported on 03/01/2017 02/09/17   Elson Areas, PA-C  DM-Phenylephrine-Acetaminophen (TYLENOL COLD MAX) 10-5-325 MG/15ML LIQD Take 15 mLs by mouth every 6 (six) hours as needed (cough, cold).    [provider]  fluticasone (FLONASE) 50 MCG/ACT nasal spray Place 2 sprays into both nostrils daily. Patient not taking: Reported on 03/01/2017 02/03/17   Muthersbaugh, Dahlia Client, PA-C  guaiFENesin-codeine 100-10 MG/5ML syrup  Take 5 mLs by mouth 3 (three) times daily as needed for cough. Patient not taking: Reported on 03/01/2017 02/03/17   Janne Napoleon, NP  ibuprofen (ADVIL,MOTRIN) 800 MG tablet Take 1 tablet (800 mg total) by mouth every 8 (eight) hours as needed. Patient not taking: Reported on 03/01/2017 02/02/17   Charlestine Night, PA-C  naproxen (NAPROSYN) 500 MG tablet Take 1 tablet (500 mg total) by mouth 2 (two) times daily. Patient not taking: Reported on 03/15/2017 03/07/17   Derwood Kaplan, MD    omeprazole (PRILOSEC) 20 MG capsule Take 1 capsule (20 mg total) by mouth daily. 03/01/17   Horton, Mayer Masker, MD  oseltamivir (TAMIFLU) 75 MG capsule Take 1 capsule (75 mg total) by mouth every 12 (twelve) hours. Patient not taking: Reported on 03/15/2017 03/12/17   Devoria Albe, MD  predniSONE (STERAPRED UNI-PAK 21 TAB) 10 MG (21) TBPK tablet Take 6 tabs (60mg ) on day 1, 5 tabs (50mg ) on day 2, 4 tabs (40mg ) on day 3, 3 tabs (30mg ) on day 4, 2 tabs (20mg ) on day 5, and 1 tab (10mg ) on day 6. 03/16/17   Joy, Shawn C, PA-C  promethazine-dextromethorphan (PROMETHAZINE-DM) 6.25-15 MG/5ML syrup Take 10 mLs by mouth 4 (four) times daily as needed for cough. Patient not taking: Reported on 03/01/2017 02/02/17   Charlestine Night, PA-C  terbinafine (LAMISIL AT) 1 % cream Apply 1 application topically 2 (two) times daily. Patient not taking: Reported on 03/01/2017 02/20/17   Maczis, Elmer Sow, PA-C    Family History History reviewed. No pertinent family history.  Social History Social History   Tobacco Use  . Smoking status: Current Every Day Smoker    Packs/day: 1.00    Years: 0.00    Pack years: 0.00    Types: Cigarettes, Cigars  . Smokeless tobacco: Never Used  Substance Use Topics  . Alcohol use: Yes  . Drug use: No    Comment: last use October 2017     Allergies   Haldol [haloperidol lactate]   Review of Systems Review of Systems  All other systems reviewed and are negative.    Physical Exam Updated Vital Signs BP 115/68 (BP Location: Left Arm)   Pulse 97   Temp (!) 103.1 F (39.5 C) (Oral)   Resp (!) 22   SpO2 96%   Physical Exam  Constitutional: He appears well-developed and well-nourished.  Disheveled with multilayered clothes.  HENT:  Head: Normocephalic and atraumatic.  Right Ear: Tympanic membrane and external ear normal.  Left Ear: Tympanic membrane and external ear normal.  Nose: Mucosal edema present. Right sinus exhibits no maxillary sinus tenderness and no  frontal sinus tenderness. Left sinus exhibits no maxillary sinus tenderness and no frontal sinus tenderness.  Mouth/Throat: Uvula is midline, oropharynx is clear and moist and mucous membranes are normal. No tonsillar exudate.  Eyes: Pupils are equal, round, and reactive to light. Right eye exhibits no discharge. Left eye exhibits no discharge. No scleral icterus.  Neck: Trachea normal. Neck supple. No spinous process tenderness present. No neck rigidity. Normal range of motion present.  No nuchal rigidity or meningismus  Cardiovascular: Normal rate, regular rhythm and intact distal pulses.  No murmur heard. Pulses:      Radial pulses are 2+ on the right side, and 2+ on the left side.       Dorsalis pedis pulses are 2+ on the right side, and 2+ on the left side.       Posterior tibial pulses are 2+  on the right side, and 2+ on the left side.  No lower extremity swelling or edema. Calves symmetric in size bilaterally.  Pulmonary/Chest: Effort normal and breath sounds normal. He exhibits no tenderness.  Abdominal: Soft. Bowel sounds are normal. There is no tenderness. There is no rigidity, no rebound, no guarding and no CVA tenderness.  Musculoskeletal: He exhibits no edema.  Lymphadenopathy:    He has no cervical adenopathy.  Neurological: He is alert.  Skin: Skin is warm and dry. No rash noted. He is not diaphoretic.  Psychiatric: He is agitated.  Patient is very verbally abusive and threatening towards staff  Nursing note and vitals reviewed.    ED Treatments / Results  Labs (all labs ordered are listed, but only abnormal results are displayed) Labs Reviewed - No data to display  EKG  EKG Interpretation None       Radiology Dg Chest 2 View  Result Date: 03/16/2017 CLINICAL DATA:  Recent episode of flu. Now with cough and chest congestion and fatigue with increasing severity over the past several days. History of asthma. EXAM: CHEST  2 VIEW COMPARISON:  Chest x-ray of March 12, 2017 FINDINGS: The lungs are adequately inflated. The interstitial markings are mildly prominent and slightly more conspicuous than in the past. There is no alveolar infiltrate or pleural effusion. The heart and pulmonary vascularity are normal. The trachea is midline. The bony thorax is unremarkable. IMPRESSION: Mild interstitial changes may reflect acute bronchitis. There is no alveolar pneumonia. Electronically Signed   By: David  SwazilandJordan M.D.   On: 03/16/2017 10:03    Procedures Procedures (including critical care time)  Medications Ordered in ED Medications  acetaminophen (TYLENOL) tablet 650 mg (650 mg Oral Given 03/16/17 2040)  ibuprofen (ADVIL,MOTRIN) tablet 600 mg (600 mg Oral Given 03/16/17 2107)  guaiFENesin (ROBITUSSIN) 100 MG/5ML solution 100 mg (100 mg Oral Given 03/16/17 2106)     Initial Impression / Assessment and Plan / ED Course  I have reviewed the triage vital signs and the nursing notes.  Pertinent labs & imaging results that were available during my care of the patient were reviewed by me and considered in my medical decision making (see chart for details).     42 y.o. male well known to the department with 66 visits in the last 6 month presenting for fever, body aches, cough and congestion. He is without any chest pain, shortness of breath, abdominal pain, hemoptysis, nausea/vomiting/diarrhea, urinary symptoms or lower leg swelling. Patient has been seen here several times for the same this week including this morning. He has negative chest xray this morning that I reviewed myself.  Patient is noted to be febrile on presentation.  He was given medications here with improvement.  He has been discharged home with prescription but has not filled them.  He is not followed up with the primary care provider.  Patient's exam is reassuring and consistent with a viral upper respiratory infection.  He is not with any chest pain, shortness of breath, abdominal pain or signs of  dehydration.  He is tolerating p.o. fluids.  Patient has been very verbally abusive and aggressive towards staff. The evaluation does not show pathology that would require ongoing emergent intervention or inpatient treatment. I advised the patient to follow-up with PCP this week. I will provide goodrx card. Specific return have been discussed. The patient verbalized understanding and agreement with plan. Vital signs improved after tylenol and motrin. He is tolerating PO food and fluid in  the department. The patient is hemodynamically stable, mentating appropriately and appears safe for discharge.  Vitals:   03/16/17 2012 03/16/17 2144  BP: 115/68   Pulse: 97   Resp: (!) 22   Temp: (!) 103.1 F (39.5 C) (!) 100.8 F (38.2 C)  TempSrc: Oral Oral  SpO2: 96%      Final Clinical Impressions(s) / ED Diagnoses   Final diagnoses:  Influenza-like illness    ED Discharge Orders    None       Princella Pellegrini 03/16/17 2148    Lorre Nick, MD 03/16/17 2354

## 2017-03-16 NOTE — ED Triage Notes (Signed)
Pt complaining of continued flu-like symptoms. He has been seen for same multiple times. He is requesting tylenol and cough syrup. A&Ox4.

## 2017-03-16 NOTE — ED Provider Notes (Signed)
[facility]-EMERGENCY DEPT Provider Note   CSN: [id number] Arrival date & time: 03/15/17  2340     History   Chief Complaint Chief Complaint  Patient presents with  . Back Pain    HPI Jude Naclerio is a 42 y.o. male.  HPI   Marten Iles is a 42 y.o. male, with a history of cocaine abuse, asthma, homelessness, and anxiety, presenting to the ED with cough, congestion, and fever.  Patient has been seen in the ED for similar symptoms on February 9, 10th, and 11th.  Diagnosed with influenza-like illness.  Patient has not tried any therapies for his symptoms.  Denies shortness of breath, chest pain, or change in symptoms, N/V/D, abdominal pain, or any other complaints.      Past Medical History:  Diagnosis Date  . Anxiety   . Asthma   . Cocaine abuse (HCC)   . Homelessness   . Migraine   . Tobacco abuse     Patient Active Problem List   Diagnosis Date Noted  . 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 Cocaine use disorder, moderate, dependence (HCC) 07/26/2016  . 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 Psychosis (HCC) 07/24/2016  . Chest pain 11/23/2015  . Asthma 11/23/2015  . Tobacco abuse 11/23/2015  . Elevated lactic acid level 11/23/2015  . Cocaine abuse (HCC)   . Concern about STD in male without diagnosis   . Paranoid ideation (HCC) 02/22/2015    No past surgical history on file.     Home Medications    Prior to Admission medications   Medication Sig Start Date End Date Taking? Authorizing Provider  DM-Phenylephrine-Acetaminophen (TYLENOL COLD MAX) 10-5-325 MG/15ML LIQD Take 15 mLs by mouth every 6 (six) hours as needed (cough, cold).   Yes [provider]  omeprazole (PRILOSEC) 20 MG capsule Take 1 capsule (20 mg total) by mouth daily. 03/01/17  Yes Horton, Mayer Masker, MD  albuterol (PROVENTIL HFA;VENTOLIN HFA) 108 (90 Base) MCG/ACT inhaler Inhale 2 puffs  into the lungs every 4 (four) hours as needed for wheezing or shortness of breath. Patient not taking: Reported on 03/01/2017 02/03/17   Muthersbaugh, Dahlia Client, PA-C  albuterol (PROVENTIL HFA;VENTOLIN HFA) 108 (90 Base) MCG/ACT inhaler Inhale 2 puffs into the lungs every 4 (four) hours as needed for wheezing or shortness of breath. 03/16/17   Joy, Shawn C, PA-C  azithromycin (ZITHROMAX Z-PAK) 250 MG tablet Take 2 po the first day then once a day for the next 4 days. Patient not taking: Reported on 03/15/2017 03/12/17   Devoria Albe, MD  benzonatate (TESSALON) 100 MG capsule Take 1 capsule (100 mg total) by mouth every 8 (eight) hours. 03/16/17   Joy, Shawn C, PA-C  diclofenac (VOLTAREN) 75 MG EC tablet Take 1 tablet (75 mg total) by mouth 2 (two) times daily. Patient not taking: Reported on 03/01/2017 02/09/17   Elson Areas, PA-C  fluticasone [facility]) 50 MCG/ACT nasal spray Place 2 sprays into both nostrils daily. Patient not taking: Reported on 03/01/2017 02/03/17   Muthersbaugh, Dahlia Client, PA-C  guaiFENesin-codeine 100-10 MG/5ML syrup Take 5 mLs by mouth 3 (three) times daily as needed for cough. Patient not taking: Reported on 03/01/2017 02/03/17   Janne Napoleon, NP  ibuprofen (ADVIL,MOTRIN) 800 MG tablet Take 1 tablet (800 mg total) by mouth every 8 (eight) hours as needed. Patient not taking: Reported on 03/01/2017 02/02/17   Charlestine Night, PA-C  naproxen (NAPROSYN) 500 MG tablet Take 1  tablet (500 mg total) by mouth 2 (two) times daily. Patient not taking: Reported on 03/15/2017 03/07/17   Derwood KaplanNanavati, Ankit, MD  oseltamivir (TAMIFLU) 75 MG capsule Take 1 capsule (75 mg total) by mouth every 12 (twelve) hours. Patient not taking: Reported on 03/15/2017 03/12/17   Devoria AlbeKnapp, Iva, MD  predniSONE (STERAPRED UNI-PAK 21 TAB) 10 MG (21) TBPK tablet Take 6 tabs (60mg ) on day 1, 5 tabs (50mg ) on day 2, 4 tabs (40mg ) on day 3, 3 tabs (30mg ) on day 4, 2 tabs (20mg ) on day 5, and 1 tab (10mg ) on day 6. 03/16/17   Joy, Shawn C, PA-C    promethazine-dextromethorphan (PROMETHAZINE-DM) 6.25-15 MG/5ML syrup Take 10 mLs by mouth 4 (four) times daily as needed for cough. Patient not taking: Reported on 03/01/2017 02/02/17   Charlestine NightLawyer, Christopher, PA-C  terbinafine (LAMISIL AT) 1 % cream Apply 1 application topically 2 (two) times daily. Patient not taking: Reported on 03/01/2017 02/20/17   Jacinto HalimMaczis, Michael M, PA-C    Family History No family history on file.  Social History Social History   Tobacco Use  . Smoking status: Current Every Day Smoker    Packs/day: 1.00    Years: 0.00    Pack years: 0.00    Types: Cigarettes, Cigars  . Smokeless tobacco: Never Used  Substance Use Topics  . Alcohol use: Yes  . Drug use: No    Comment: last use October 2017     Allergies   Haldol [haloperidol lactate]   Review of Systems Review of Systems  Constitutional: Positive for fever.  HENT: Positive for congestion.   Respiratory: Positive for cough. Negative for shortness of breath.   Cardiovascular: Negative for chest pain.  Gastrointestinal: Negative for abdominal pain, diarrhea, nausea and vomiting.  All other systems reviewed and are negative.    Physical Exam Updated Vital Signs BP (!) 101/53   Pulse 92   Temp 99.5 F (37.5 C) (Oral)   Resp 18   SpO2 96%   Physical Exam  Constitutional: He appears well-developed and well-nourished. No distress.  HENT:  Head: Normocephalic and atraumatic.  Eyes: Conjunctivae are normal.  Neck: Neck supple.  Cardiovascular: Normal rate, regular rhythm, normal heart sounds and intact distal pulses.  Pulmonary/Chest: Effort normal and breath sounds normal. No respiratory distress.  Patient appears relaxed.  Sleeping on my initial exam, but easily rousable.  No increased work of breathing.  Speaks in full sentences without difficulty.  Abdominal: Soft. There is no tenderness. There is no guarding.  Musculoskeletal: He exhibits no edema.  Lymphadenopathy:    He has no cervical  adenopathy.  Neurological: He is alert.  Skin: Skin is warm and dry. He is not diaphoretic.  Psychiatric: He has a normal mood and affect. His behavior is normal.  Nursing note and vitals reviewed.    ED Treatments / Results  Labs (all labs ordered are listed, but only abnormal results are displayed) Labs Reviewed - No data to display  EKG  EKG Interpretation None       Radiology Dg Chest 2 View  Result Date: 03/16/2017 CLINICAL DATA:  Recent episode of flu. Now with cough and chest congestion and fatigue with increasing severity over the past several days. History of asthma. EXAM: CHEST  2 VIEW COMPARISON:  Chest x-ray of March 12, 2017 FINDINGS: The lungs are adequately inflated. The interstitial markings are mildly prominent and slightly more conspicuous than in the past. There is no alveolar infiltrate or pleural effusion. The heart  and pulmonary vascularity are normal. The trachea is midline. The bony thorax is unremarkable. IMPRESSION: Mild interstitial changes may reflect acute bronchitis. There is no alveolar pneumonia. Electronically Signed   By: David  Sw[place] M.D.   On: 03/16/2017 10:03    Procedures Procedures (including critical care time)  Medications Ordered in ED Medications - No data to display   Initial Impression / Assessment and Plan / ED Course  I have reviewed the triage vital signs and the nursing notes.  Pertinent labs & imaging results that were available during my care of the patient were reviewed by me and considered in my medical decision making (see chart for details).      Patient presents again to the ED with cough, congestion, and fever.  He has not attempted any of the other recommended therapies.  Shows no increased work of breathing. Patient is nontoxic appearing, afebrile, not tachycardic, not tachypneic, not hypotensive, maintains adequate SPO2 on room air, and is in no apparent distress.  No evidence of pneumonia on chest x-ray. The  patient was given instructions for home care as well as return precautions. Patient voices understanding of these instructions, accepts the plan, and is comfortable with discharge.  Vitals:   03/16/17 0113 03/16/17 0637 03/16/17 1015  BP: 122/80 (!) 101/53 (!) 113/54  Pulse: (!) 59 92 83  Resp: 18 18 18   Temp: 99.3 F (37.4 C) 99.5 F (37.5 C)   TempSrc: Oral Oral   SpO2: 95% 96% 100%     Final Clinical Impressions(s) / ED Diagnoses   Final diagnoses:  Cough    ED Discharge Orders        Ordered    benzonatate (TESSALON) 100 MG capsule  Every 8 hours     03/16/17 1010    albuterol (PROVENTIL HFA;VENTOLIN HFA) 108 (90 Base) MCG/ACT inhaler  Every 4 hours PRN     03/16/17 1010    predniSONE (STERAPRED UNI-PAK 21 TAB) 10 MG (21) TBPK tablet     03/16/17 1010       Anselm Pancoast, PA-C 03/17/17 1325    Jacalyn Lefevre, MD 03/23/17 0700

## 2017-03-16 NOTE — Discharge Instructions (Signed)
Your symptoms are consistent with a viral illness. Viruses do not require antibiotics. Treatment is symptomatic care and it is important to note that these symptoms may last for 7-14 days.   Hand washing: Wash your hands throughout the day, but especially before and after touching the face, using the restroom, sneezing, coughing, or touching surfaces that have been coughed or sneezed upon. Hydration: Symptoms will be intensified and complicated by dehydration. Dehydration can also extend the duration of symptoms. Drink plenty of fluids and get plenty of rest. You should be drinking at least half a liter of water an hour to stay hydrated. Electrolyte drinks (ex. Gatorade, Powerade, Pedialyte) are also encouraged. You should be drinking enough fluids to make your urine light yellow, almost clear. If this is not the case, you are not drinking enough water. Please note that some of the treatments indicated below will not be effective if you are not adequately hydrated. Diet: Please concentrate on hydration, however, you may introduce food slowly.  Start with a clear liquid diet, progressed to a full liquid diet, and then bland solids as you are able. Pain or fever: Ibuprofen, Naproxen, or Tylenol for pain or fever.  Cough: Use the Tessalon for cough.  Albuterol: May use the albuterol as needed for instances of shortness of breath. Prednisone: Take the prednisone, as directed, in its entirety. Zyrtec or Claritin: May add these medication daily to control underlying symptoms of congestion, sneezing, and other signs of allergies. Flonase: Use this medication, as directed, for nasal and sinus congestion. Congestion: Plain Mucinex may help relieve congestion. Saline sinus rinses and saline nasal sprays may also help relieve congestion. If you do not have heart problems or an allergy to such medications, you may also try phenylephrine or Sudafed. Sore throat: Warm liquids or Chloraseptic spray may help soothe a  sore throat. Gargle twice a day with a salt water solution made from a half teaspoon of salt in a cup of warm water.  Follow up: Follow up with a primary care provider, as needed, for any future management of this issue.

## 2017-03-16 NOTE — ED Notes (Signed)
Bed: [facility] Expected date:  Expected time:  Means of arrival:  Comments: 

## 2017-03-16 NOTE — Discharge Instructions (Signed)
You were seen here today for flu like symptoms. I am giving you a good Rx card to help you with the cost of your previous prescriptions that you were given of the last 2 days. Please follow with your primary care physician this week. If you develop any chest pain, shortness of breath, abdominal pain or cough up blood please return to the emergency department for further evaluation.

## 2017-03-16 NOTE — ED Notes (Signed)
Vitals assessment- oral temp measures@ 103.1. Encouraged pt to remove at least 2 of 2 coats d/t temp..the patient states he will (but not witnessed prior to leaving room). Olena LeatherwoodJaneen RN advised. [employer]

## 2017-03-17 ENCOUNTER — Emergency Department (HOSPITAL_COMMUNITY)
Admission: EM | Admit: 2017-03-17 | Discharge: 2017-03-18 | Disposition: A | Discharge status: Home / Self Care | Payer: Self-pay | Attending: Emergency Medicine | Admitting: Emergency Medicine

## 2017-03-17 ENCOUNTER — Encounter (HOSPITAL_COMMUNITY): Payer: Self-pay

## 2017-03-17 ENCOUNTER — Other Ambulatory Visit: Payer: Self-pay

## 2017-03-17 DIAGNOSIS — J45909 Unspecified asthma, uncomplicated: Secondary | ICD-10-CM | POA: Insufficient documentation

## 2017-03-17 DIAGNOSIS — R69 Illness, unspecified: Secondary | ICD-10-CM

## 2017-03-17 DIAGNOSIS — J111 Influenza due to unidentified influenza virus with other respiratory manifestations: Secondary | ICD-10-CM

## 2017-03-17 DIAGNOSIS — Z79899 Other long term (current) drug therapy: Secondary | ICD-10-CM | POA: Insufficient documentation

## 2017-03-17 DIAGNOSIS — F17210 Nicotine dependence, cigarettes, uncomplicated: Secondary | ICD-10-CM | POA: Insufficient documentation

## 2017-03-17 DIAGNOSIS — B9789 Other viral agents as the cause of diseases classified elsewhere: Secondary | ICD-10-CM

## 2017-03-17 DIAGNOSIS — J069 Acute upper respiratory infection, unspecified: Secondary | ICD-10-CM | POA: Insufficient documentation

## 2017-03-17 NOTE — ED Notes (Signed)
Bed: WA06 Expected date:  Expected time:  Means of arrival:  Comments: 

## 2017-03-17 NOTE — ED Triage Notes (Signed)
Pt reports that he feels much better since last night. He has been seen multiple times over the past few days for flu like symptoms. He states that he tried to eat a cheeseburger today threw it away because he felt full. He denies emesis or pain. Afebrile.

## 2017-03-18 ENCOUNTER — Encounter (HOSPITAL_COMMUNITY): Payer: Self-pay | Admitting: Nurse Practitioner

## 2017-03-18 ENCOUNTER — Other Ambulatory Visit: Payer: Self-pay

## 2017-03-18 ENCOUNTER — Encounter (HOSPITAL_COMMUNITY): Payer: Self-pay | Admitting: *Deleted

## 2017-03-18 ENCOUNTER — Emergency Department (HOSPITAL_COMMUNITY)
Admission: EM | Admit: 2017-03-18 | Discharge: 2017-03-18 | Disposition: A | Discharge status: Home / Self Care | Payer: Self-pay | Attending: Emergency Medicine | Admitting: Emergency Medicine

## 2017-03-18 DIAGNOSIS — Z5321 Procedure and treatment not carried out due to patient leaving prior to being seen by health care provider: Secondary | ICD-10-CM | POA: Insufficient documentation

## 2017-03-18 DIAGNOSIS — R05 Cough: Secondary | ICD-10-CM | POA: Insufficient documentation

## 2017-03-18 DIAGNOSIS — R0789 Other chest pain: Secondary | ICD-10-CM | POA: Insufficient documentation

## 2017-03-18 DIAGNOSIS — J45909 Unspecified asthma, uncomplicated: Secondary | ICD-10-CM | POA: Insufficient documentation

## 2017-03-18 DIAGNOSIS — F17210 Nicotine dependence, cigarettes, uncomplicated: Secondary | ICD-10-CM | POA: Insufficient documentation

## 2017-03-18 MED ORDER — ACETAMINOPHEN 500 MG PO TABS
1000.0000 mg | ORAL_TABLET | Freq: Once | ORAL | Status: AC
  Administered 2017-03-19: 1000 mg via ORAL
  Filled 2017-03-18: qty 2

## 2017-03-18 MED ORDER — BENZONATATE 100 MG PO CAPS
100.0000 mg | ORAL_CAPSULE | Freq: Once | ORAL | Status: AC
  Administered 2017-03-18: 100 mg via ORAL
  Filled 2017-03-18: qty 1

## 2017-03-18 MED ORDER — BENZONATATE 100 MG PO CAPS: 100.0000 mg | ORAL_CAPSULE | Freq: Three times a day (TID) | ORAL | 0 refills | Status: DC

## 2017-03-18 MED ORDER — OSELTAMIVIR PHOSPHATE 75 MG PO CAPS
75.0000 mg | ORAL_CAPSULE | Freq: Once | ORAL | Status: AC
  Administered 2017-03-18: 75 mg via ORAL
  Filled 2017-03-18: qty 1

## 2017-03-18 MED ORDER — OSELTAMIVIR PHOSPHATE 75 MG PO CAPS: 75.0000 mg | ORAL_CAPSULE | Freq: Two times a day (BID) | ORAL | 0 refills | Status: DC

## 2017-03-18 MED ORDER — GUAIFENESIN 100 MG/5ML PO SOLN
5.0000 mL | Freq: Once | ORAL | Status: AC
  Administered 2017-03-19: 100 mg via ORAL
  Filled 2017-03-18: qty 10

## 2017-03-18 NOTE — ED Provider Notes (Signed)
[facility]-EMERGENCY DEPT Provider Note   CSN: [id number] Arrival date & time: 03/17/17  2045     History   Chief Complaint Chief Complaint  Patient presents with  . URI    HPI Dustin Norris is a 42 y.o. male.  The history is provided by the patient and medical records. No language interpreter was used.  URI   This is a new problem. The current episode started 2 days ago. The problem has not changed since onset.The maximum temperature recorded prior to his arrival was 100 to 100.9 F. The fever has been present for 1 to 2 days. Associated symptoms include congestion, rhinorrhea, sneezing and cough. Pertinent negatives include no chest pain, no abdominal pain, no diarrhea, no nausea, no vomiting, no headaches, no neck pain, no rash and no wheezing. He has tried nothing for the symptoms. The treatment provided no relief.    Past Medical History:  Diagnosis Date  . Anxiety   . Asthma   . Cocaine abuse (HCC)   . Homelessness   . Migraine   . Tobacco abuse     Patient Active Problem List   Diagnosis Date Noted  . 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 Cocaine use disorder, moderate, dependence (HCC) 07/26/2016  . 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 Psychosis (HCC) 07/24/2016  . Chest pain 11/23/2015  . Asthma 11/23/2015  . Tobacco abuse 11/23/2015  . Elevated lactic acid level 11/23/2015  . Cocaine abuse (HCC)   . Concern about STD in male without diagnosis   . Paranoid ideation (HCC) 02/22/2015    History reviewed. No pertinent surgical history.     Home Medications    Prior to Admission medications   Medication Sig Start Date End Date Taking? Authorizing Provider  albuterol (PROVENTIL HFA;VENTOLIN HFA) 108 (90 Base) MCG/ACT inhaler Inhale 2 puffs into the lungs every 4 (four) hours as needed for wheezing or shortness of breath. Patient not taking: Reported on 03/01/2017  02/03/17   Muthersbaugh, Dahlia Client, PA-C  albuterol (PROVENTIL HFA;VENTOLIN HFA) 108 (90 Base) MCG/ACT inhaler Inhale 2 puffs into the lungs every 4 (four) hours as needed for wheezing or shortness of breath. 03/16/17   Joy, Shawn C, PA-C  azithromycin (ZITHROMAX Z-PAK) 250 MG tablet Take 2 po the first day then once a day for the next 4 days. Patient not taking: Reported on 03/15/2017 03/12/17   Devoria Albe, MD  benzonatate (TESSALON) 100 MG capsule Take 1 capsule (100 mg total) by mouth every 8 (eight) hours. 03/16/17   Joy, Shawn C, PA-C  diclofenac (VOLTAREN) 75 MG EC tablet Take 1 tablet (75 mg total) by mouth 2 (two) times daily. Patient not taking: Reported on 03/01/2017 02/09/17   Elson Areas, PA-C  DM-Phenylephrine-Acetaminophen (TYLENOL COLD MAX) 10-5-325 MG/15ML LIQD Take 15 mLs by mouth every 6 (six) hours as needed (cough, cold).    [provider]  fluticasone (FLONASE) 50 MCG/ACT nasal spray Place 2 sprays into both nostrils daily. Patient not taking: Reported on 03/01/2017 02/03/17   Muthersbaugh, Dahlia Client, PA-C  guaiFENesin-codeine 100-10 MG/5ML syrup Take 5 mLs by mouth 3 (three) times daily as needed for cough. Patient not taking: Reported on 03/01/2017 02/03/17   Janne Napoleon, NP  ibuprofen (ADVIL,MOTRIN) 800 MG tablet Take 1 tablet (800 mg total) by mouth every 8 (eight) hours as needed. Patient not taking: Reported on 03/01/2017 02/02/17   Charlestine Night, PA-C  naproxen (NAPROSYN) 500 MG tablet Take  1 tablet (500 mg total) by mouth 2 (two) times daily. Patient not taking: Reported on 03/15/2017 03/07/17   Derwood KaplanNanavati, Ankit, MD  omeprazole (PRILOSEC) 20 MG capsule Take 1 capsule (20 mg total) by mouth daily. 03/01/17   Horton, Mayer Maskerourtney F, MD  oseltamivir (TAMIFLU) 75 MG capsule Take 1 capsule (75 mg total) by mouth every 12 (twelve) hours. Patient not taking: Reported on 03/15/2017 03/12/17   Devoria AlbeKnapp, Iva, MD  predniSONE (STERAPRED UNI-PAK 21 TAB) 10 MG (21) TBPK tablet Take 6 tabs (60mg ) on  day 1, 5 tabs (50mg ) on day 2, 4 tabs (40mg ) on day 3, 3 tabs (30mg ) on day 4, 2 tabs (20mg ) on day 5, and 1 tab (10mg ) on day 6. 03/16/17   Joy, Shawn C, PA-C  promethazine-dextromethorphan (PROMETHAZINE-DM) 6.25-15 MG/5ML syrup Take 10 mLs by mouth 4 (four) times daily as needed for cough. Patient not taking: Reported on 03/01/2017 02/02/17   Charlestine NightLawyer, Kimbly Eanes, PA-C  terbinafine (LAMISIL AT) 1 % cream Apply 1 application topically 2 (two) times daily. Patient not taking: Reported on 03/01/2017 02/20/17   Maczis, Elmer SowMichael M, PA-C    Family History History reviewed. No pertinent family history.  Social History Social History   Tobacco Use  . Smoking status: Current Every Day Smoker    Packs/day: 1.00    Years: 0.00    Pack years: 0.00    Types: Cigarettes, Cigars  . Smokeless tobacco: Never Used  Substance Use Topics  . Alcohol use: Yes  . Drug use: No    Comment: last use October 2017     Allergies   Haldol [haloperidol lactate]   Review of Systems Review of Systems  Constitutional: Positive for chills, fatigue and fever. Negative for diaphoresis.  HENT: Positive for congestion, rhinorrhea and sneezing.   Eyes: Negative for visual disturbance.  Respiratory: Positive for cough. Negative for chest tightness, shortness of breath, wheezing and stridor.   Cardiovascular: Negative for chest pain.  Gastrointestinal: Negative for abdominal pain, diarrhea, nausea and vomiting.  Genitourinary: Negative for flank pain.  Musculoskeletal: Negative for back pain and neck pain.  Skin: Negative for rash.  Neurological: Negative for light-headedness and headaches.  Psychiatric/Behavioral: Negative for agitation.  All other systems reviewed and are negative.    Physical Exam Updated Vital Signs BP 133/90 (BP Location: Right Arm)   Pulse 84   Temp 98.5 F (36.9 C) (Oral)   Resp 18   SpO2 100%   Physical Exam  Constitutional: He is oriented to person, place, and time. He appears  well-developed and well-nourished.  HENT:  Head: Normocephalic and atraumatic.  Nose: Nose normal.  Mouth/Throat: Oropharynx is clear and moist. No oropharyngeal exudate.  Eyes: Conjunctivae and EOM are normal. Pupils are equal, round, and reactive to light.  Neck: Normal range of motion. Neck supple. No JVD present.  Cardiovascular: Normal rate and regular rhythm.  No murmur heard. Pulmonary/Chest: Effort normal and breath sounds normal. No respiratory distress. He has no wheezes.  Abdominal: Soft. There is no tenderness.  Musculoskeletal: He exhibits no edema or tenderness.  Neurological: He is alert and oriented to person, place, and time. No sensory deficit. He exhibits normal muscle tone.  Skin: Skin is warm and dry. Capillary refill takes less than 2 seconds. No erythema. No pallor.  Psychiatric: He has a normal mood and affect.  Nursing note and vitals reviewed.    ED Treatments / Results  Labs (all labs ordered are listed, but only abnormal results are displayed) Labs  Reviewed - No data to display  EKG  EKG Interpretation None       Radiology Dg Chest 2 View  Result Date: 03/16/2017 CLINICAL DATA:  Recent episode of flu. Now with cough and chest congestion and fatigue with increasing severity over the past several days. History of asthma. EXAM: CHEST  2 VIEW COMPARISON:  Chest x-ray of March 12, 2017 FINDINGS: The lungs are adequately inflated. The interstitial markings are mildly prominent and slightly more conspicuous than in the past. There is no alveolar infiltrate or pleural effusion. The heart and pulmonary vascularity are normal. The trachea is midline. The bony thorax is unremarkable. IMPRESSION: Mild interstitial changes may reflect acute bronchitis. There is no alveolar pneumonia. Electronically Signed   By: David  [place] M.D.   On: 03/16/2017 10:03    Procedures Procedures (including critical care time)  Medications Ordered in ED Medications  benzonatate  (TESSALON) capsule 100 mg (100 mg Oral Given 03/18/17 0153)  oseltamivir (TAMIFLU) capsule 75 mg (75 mg Oral Given 03/18/17 0153)     Initial Impression / Assessment and Plan / ED Course  I have reviewed the triage vital signs and the nursing notes.  Pertinent labs & imaging results that were available during my care of the patient were reviewed by me and considered in my medical decision making (see chart for details).     Renato Spellman is a 42 y.o. male with a past medical history significant for substance abuse and asthma who presents with URI.  Patient reports that he was diagnosed with influenza several days ago but lost his prescription of Tamiflu.  He reports that he has had continued fevers, chills, congestion, cough and malaise.  He reports that he has had decreased appetite.  He denies nausea, vomiting, constipation, or diarrhea.  He reports that his cough has been continuous and he requested cough medicine.  On exam, lungs clear.  Chest is nontender.  Abdomen nontender.  Patient had no focal neurologic deficits on initial exam.  Patient resting comfortably.  Patient did have some congestion that was audible.  Based on patient's recent diagnosis of influenza, I suspect patient still has the flu.  Patient's vital signs are reassuring.  Patient was able to tolerate eating and drinking here in the emergency department.  Do not feel patient has developed pneumonia as he otherwise appears well and lungs were clear.  Patient given dose of Tamiflu and Tessalon.  Patient given prescription for Tamiflu and Tessalon and will follow up with PCP in several days.  Patient voiced understanding of plan of care and was discharged in good condition with continued outpatient management instructions for his influenza.   Final Clinical Impressions(s) / ED Diagnoses   Final diagnoses:  Viral URI with cough  Influenza-like illness    ED Discharge Orders        Ordered    oseltamivir (TAMIFLU)  75 MG capsule  Every 12 hours     03/18/17 0241    benzonatate (TESSALON) 100 MG capsule  Every 8 hours     03/18/17 0241      Clinical Impression: 1. Viral URI with cough   2. Influenza-like illness     Disposition: Discharge  Condition: Good  I have discussed the results, Dx and Tx plan with the pt(& family if present). He/she/they expressed understanding and agree(s) with the plan. Discharge instructions discussed at great length. Strict return precautions discussed and pt &/or family have verbalized understanding of the instructions. No further questions at  time of discharge.    Discharge Medication List as of 03/18/2017  2:45 AM    START taking these medications   Details  !! benzonatate (TESSALON) 100 MG capsule Take 1 capsule (100 mg total) by mouth every 8 (eight) hours., Starting Fri 03/18/2017, Print    !! oseltamivir (TAMIFLU) 75 MG capsule Take 1 capsule (75 mg total) by mouth every 12 (twelve) hours., Starting Fri 03/18/2017, Print     !! - Potential duplicate medications found. Please discuss with provider.      Follow Up: Lavinia Sharps, NP [address] [place] [address] [phone]     [facility] [address] [id number] mc [address] [place] [address] [phone]       Tola Meas, Canary Brim, MD 03/18/17 [phone]

## 2017-03-18 NOTE — ED Triage Notes (Signed)
The pt is c/o generalized body aches cold cough for 2 days with maybe a fever productive cough thick yellow sputum headache

## 2017-03-18 NOTE — ED Notes (Signed)
Pt came to desk stating that he has to leave

## 2017-03-18 NOTE — ED Notes (Signed)
While attempting to review dc information, pt got angry and threw away dc paperwork including rx. Pt was educated the rationale for medications and he stated he did not care. Pt left the department ambulatory with no alterations in gait

## 2017-03-18 NOTE — Discharge Instructions (Signed)
Your exam today was consistent with a flulike illness.  As you report that you lost her Tamiflu prescription, I am re-prescribing it.  Please also take the [facility] for your cough.  Please stay hydrated and follow-up with your primary care physician.  As you are vital signs are reassuring we feel you are safe for discharge home.  Please return to the emergency department if you have any new or worsened symptoms.

## 2017-03-18 NOTE — ED Notes (Signed)
[facility] and left because he was having to wait too long

## 2017-03-18 NOTE — ED Triage Notes (Signed)
Pt states he wants tylenol and cough syrup.

## 2017-03-19 ENCOUNTER — Other Ambulatory Visit: Payer: Self-pay

## 2017-03-19 ENCOUNTER — Emergency Department (HOSPITAL_COMMUNITY)
Admission: EM | Admit: 2017-03-19 | Discharge: 2017-03-19 | Disposition: A | Discharge status: Home / Self Care | Payer: Self-pay | Attending: Emergency Medicine | Admitting: Emergency Medicine

## 2017-03-19 ENCOUNTER — Encounter (HOSPITAL_COMMUNITY): Payer: Self-pay

## 2017-03-19 DIAGNOSIS — R059 Cough, unspecified: Secondary | ICD-10-CM

## 2017-03-19 DIAGNOSIS — Z79899 Other long term (current) drug therapy: Secondary | ICD-10-CM | POA: Insufficient documentation

## 2017-03-19 DIAGNOSIS — R05 Cough: Secondary | ICD-10-CM

## 2017-03-19 DIAGNOSIS — F17210 Nicotine dependence, cigarettes, uncomplicated: Secondary | ICD-10-CM | POA: Insufficient documentation

## 2017-03-19 DIAGNOSIS — M549 Dorsalgia, unspecified: Secondary | ICD-10-CM | POA: Insufficient documentation

## 2017-03-19 DIAGNOSIS — J45909 Unspecified asthma, uncomplicated: Secondary | ICD-10-CM | POA: Insufficient documentation

## 2017-03-19 DIAGNOSIS — W010XXA Fall on same level from slipping, tripping and stumbling without subsequent striking against object, initial encounter: Secondary | ICD-10-CM | POA: Insufficient documentation

## 2017-03-19 MED ORDER — ALBUTEROL SULFATE HFA 108 (90 BASE) MCG/ACT IN AERS
2.0000 | INHALATION_SPRAY | Freq: Once | RESPIRATORY_TRACT | Status: AC
  Administered 2017-03-19: 2 via RESPIRATORY_TRACT
  Filled 2017-03-19: qty 6.7

## 2017-03-19 NOTE — ED Triage Notes (Signed)
States slipped and fell after being seen here and [facility] and now upper and lower back pain voiced steady gait noted.

## 2017-03-19 NOTE — ED Notes (Signed)
Pt is not in the lobby at this time. Pt seen walking across parking lot

## 2017-03-19 NOTE — ED Provider Notes (Signed)
TIME SEEN: 2:51 AM  CHIEF COMPLAINT: "I need something for this cough"  HPI: Patient is a 42 year old male with history of homelessness, migraines, asthma, substance abuse who presents to the emergency department requesting something for his cough.  Patient is well-known to our emergency department with 69 visits in the past 6 months.  Was just treated for possible influenza with symptoms that started last week.  States fevers have resolved but he still has productive cough and wheezing.  No vomiting or diarrhea.  Having chest pain with coughing.  ROS: See HPI Constitutional: no fever  Eyes: no drainage  ENT: no runny nose   Cardiovascular: CP with coughing Resp: no SOB  GI: no vomiting GU: no dysuria Integumentary: no rash  Allergy: no hives  Musculoskeletal: no leg swelling  Neurological: no slurred speech ROS otherwise negative  PAST MEDICAL HISTORY/PAST SURGICAL HISTORY:  Past Medical History:  Diagnosis Date  . Anxiety   . Asthma   . Cocaine abuse (HCC)   . Homelessness   . Migraine   . Tobacco abuse     MEDICATIONS:  Prior to Admission medications   Medication Sig Start Date End Date Taking? Authorizing Provider  albuterol (PROVENTIL HFA;VENTOLIN HFA) 108 (90 Base) MCG/ACT inhaler Inhale 2 puffs into the lungs every 4 (four) hours as needed for wheezing or shortness of breath. Patient not taking: Reported on 03/01/2017 02/03/17   Muthersbaugh, Dahlia Client, PA-C  albuterol (PROVENTIL HFA;VENTOLIN HFA) 108 (90 Base) MCG/ACT inhaler Inhale 2 puffs into the lungs every 4 (four) hours as needed for wheezing or shortness of breath. 03/16/17   Joy, Shawn C, PA-C  azithromycin (ZITHROMAX Z-PAK) 250 MG tablet Take 2 po the first day then once a day for the next 4 days. Patient not taking: Reported on 03/15/2017 03/12/17   Devoria Albe, MD  benzonatate (TESSALON) 100 MG capsule Take 1 capsule (100 mg total) by mouth every 8 (eight) hours. 03/16/17   Joy, Shawn C, PA-C  benzonatate (TESSALON) 100  MG capsule Take 1 capsule (100 mg total) by mouth every 8 (eight) hours. 03/18/17   Tegeler, Canary Brim, MD  diclofenac (VOLTAREN) 75 MG EC tablet Take 1 tablet (75 mg total) by mouth 2 (two) times daily. Patient not taking: Reported on 03/01/2017 02/09/17   Elson Areas, PA-C  DM-Phenylephrine-Acetaminophen (TYLENOL COLD MAX) 10-5-325 MG/15ML LIQD Take 15 mLs by mouth every 6 (six) hours as needed (cough, cold).    [provider]  fluticasone (FLONASE) 50 MCG/ACT nasal spray Place 2 sprays into both nostrils daily. Patient not taking: Reported on 03/01/2017 02/03/17   Muthersbaugh, Dahlia Client, PA-C  guaiFENesin-codeine 100-10 MG/5ML syrup Take 5 mLs by mouth 3 (three) times daily as needed for cough. Patient not taking: Reported on 03/01/2017 02/03/17   Janne Napoleon, NP  ibuprofen (ADVIL,MOTRIN) 800 MG tablet Take 1 tablet (800 mg total) by mouth every 8 (eight) hours as needed. Patient not taking: Reported on 03/01/2017 02/02/17   Charlestine Night, PA-C  naproxen (NAPROSYN) 500 MG tablet Take 1 tablet (500 mg total) by mouth 2 (two) times daily. Patient not taking: Reported on 03/15/2017 03/07/17   Derwood Kaplan, MD  omeprazole (PRILOSEC) 20 MG capsule Take 1 capsule (20 mg total) by mouth daily. 03/01/17   Horton, Mayer Masker, MD  oseltamivir (TAMIFLU) 75 MG capsule Take 1 capsule (75 mg total) by mouth every 12 (twelve) hours. Patient not taking: Reported on 03/15/2017 03/12/17   Devoria Albe, MD  oseltamivir (TAMIFLU) 75 MG capsule  Take 1 capsule (75 mg total) by mouth every 12 (twelve) hours. 03/18/17   Tegeler, Canary Brimhristopher J, MD  predniSONE (STERAPRED UNI-PAK 21 TAB) 10 MG (21) TBPK tablet Take 6 tabs (60mg ) on day 1, 5 tabs (50mg ) on day 2, 4 tabs (40mg ) on day 3, 3 tabs (30mg ) on day 4, 2 tabs (20mg ) on day 5, and 1 tab (10mg ) on day 6. 03/16/17   Joy, Shawn C, PA-C  promethazine-dextromethorphan (PROMETHAZINE-DM) 6.25-15 MG/5ML syrup Take 10 mLs by mouth 4 (four) times daily as needed for  cough. Patient not taking: Reported on 03/01/2017 02/02/17   Charlestine NightLawyer, Christopher, PA-C  terbinafine (LAMISIL AT) 1 % cream Apply 1 application topically 2 (two) times daily. Patient not taking: Reported on 03/01/2017 02/20/17   Jacinto HalimMaczis, Michael M, PA-C    ALLERGIES:  Allergies  Allergen Reactions  . Haldol [Haloperidol Lactate] Anaphylaxis    SOCIAL HISTORY:  Social History   Tobacco Use  . Smoking status: Current Every Day Smoker    Packs/day: 1.00    Years: 0.00    Pack years: 0.00    Types: Cigarettes, Cigars  . Smokeless tobacco: Never Used  Substance Use Topics  . Alcohol use: Yes    FAMILY HISTORY: History reviewed. No pertinent family history.  EXAM: BP 130/86 (BP Location: Right Arm)   Pulse 88   Temp 98.8 F (37.1 C) (Oral)   Resp 20   SpO2 98%  CONSTITUTIONAL: Alert and oriented and responds appropriately to questions. Well-appearing; well-nourished HEAD: Normocephalic EYES: Conjunctivae clear, pupils appear equal, EOMI ENT: normal nose; moist mucous membranes NECK: Supple, no meningismus, no nuchal rigidity, no LAD  CARD: RRR; S1 and S2 appreciated; no murmurs, no clicks, no rubs, no gallops RESP: Normal chest excursion without splinting or tachypnea; breath sounds clear and equal bilaterally; mild scattered expiratory wheeze, no rhonchi, no rales, no hypoxia or respiratory distress, speaking full sentences ABD/GI: Normal bowel sounds; non-distended; soft, non-tender, no rebound, no guarding, no peritoneal signs, no hepatosplenomegaly BACK:  The back appears normal and is non-tender to palpation, there is no CVA tenderness EXT: Normal ROM in all joints; non-tender to palpation; no edema; normal capillary refill; no cyanosis, no calf tenderness or swelling    SKIN: Normal color for age and race; warm; no rash NEURO: Moves all extremities equally PSYCH: The patient's mood and manner are appropriate. Grooming and personal hygiene are appropriate.  MEDICAL DECISION  MAKING: Patient here with nonproductive cough.  His lungs are clear except for some intermittent nonproductive wheeze.  Will give him albuterol inhaler and treat with Tylenol and guaifenesin.  I do not feel he needs a chest x-ray at this time.  He is otherwise well-appearing, nonseptic, well-hydrated.  I feel he is safe to be discharged.   At this time, I do not feel there is any life-threatening condition present. I have reviewed and discussed all results (EKG, imaging, lab, urine as appropriate) and exam findings with patient/family. I have reviewed nursing notes and appropriate previous records.  I feel the patient is safe to be discharged home without further emergent workup and can continue workup as an outpatient as needed. Discussed usual and customary return precautions. Patient/family verbalize understanding and are comfortable with this plan.  Outpatient follow-up has been provided if needed. All questions have been answered.      Cherry Wittwer, Layla MawKristen N, DO 03/19/17 [phone]

## 2017-03-19 NOTE — ED Notes (Signed)
Patient given a sprite and bus pass at discharge.

## 2017-03-19 NOTE — Discharge Instructions (Signed)
You may alternate Tylenol 1000 mg every 6 hours as needed for pain and Ibuprofen 800 mg every 8 hours as needed for pain.  Please take Ibuprofen with food.  You may use over-the-counter guaifenesin and dextromethorphan for cough.  You may use your albuterol inhaler 2-4 puffs every 2-4 hours as needed for shortness of breath and wheezing.

## 2017-03-19 NOTE — ED Notes (Signed)
Pt c/o flank pain on both sides and upper neck pain. He reports falling earlier today coming off the bus.

## 2017-03-20 ENCOUNTER — Encounter (HOSPITAL_COMMUNITY): Payer: Self-pay

## 2017-03-20 ENCOUNTER — Emergency Department (HOSPITAL_COMMUNITY)
Admission: EM | Admit: 2017-03-20 | Discharge: 2017-03-20 | Disposition: A | Discharge status: Home / Self Care | Payer: Self-pay | Attending: Emergency Medicine | Admitting: Emergency Medicine

## 2017-03-20 DIAGNOSIS — J029 Acute pharyngitis, unspecified: Secondary | ICD-10-CM | POA: Insufficient documentation

## 2017-03-20 DIAGNOSIS — W010XXA Fall on same level from slipping, tripping and stumbling without subsequent striking against object, initial encounter: Secondary | ICD-10-CM

## 2017-03-20 DIAGNOSIS — Z79899 Other long term (current) drug therapy: Secondary | ICD-10-CM | POA: Insufficient documentation

## 2017-03-20 DIAGNOSIS — J45909 Unspecified asthma, uncomplicated: Secondary | ICD-10-CM | POA: Insufficient documentation

## 2017-03-20 DIAGNOSIS — Z590 Homelessness: Secondary | ICD-10-CM | POA: Insufficient documentation

## 2017-03-20 DIAGNOSIS — F17210 Nicotine dependence, cigarettes, uncomplicated: Secondary | ICD-10-CM | POA: Insufficient documentation

## 2017-03-20 DIAGNOSIS — M549 Dorsalgia, unspecified: Secondary | ICD-10-CM

## 2017-03-20 MED ORDER — IBUPROFEN 600 MG PO TABS: 600.0000 mg | ORAL_TABLET | Freq: Four times a day (QID) | ORAL | 0 refills | Status: DC | PRN

## 2017-03-20 MED ORDER — IBUPROFEN 800 MG PO TABS
800.0000 mg | ORAL_TABLET | Freq: Once | ORAL | Status: AC
  Administered 2017-03-20: 800 mg via ORAL
  Filled 2017-03-20: qty 1

## 2017-03-20 NOTE — ED Notes (Addendum)
Patient a/o x4 and ambulating without difficulty upon discharge, AVS reviewed with pt, pt verbalized understanding.

## 2017-03-20 NOTE — ED Provider Notes (Signed)
[facility]-EMERGENCY DEPT Provider Note   CSN: [id number] Arrival date & time: 03/19/17  2120     History   Chief Complaint Chief Complaint  Patient presents with  . Back Pain    HPI Dustin Norris is a 42 y.o. male.  The history is provided by the patient.  He has a history of homelessness, asthma, cocaine abuse and comes in complaining of pain in his back after slipping and falling.  He rates pain a 10/10.  He denies any weakness, numbness, tingling.  Denies any bowel or bladder dysfunction.  He has not taken anything for pain.  Past Medical History:  Diagnosis Date  . Anxiety   . Asthma   . Cocaine abuse (HCC)   . Homelessness   . Migraine   . Tobacco abuse     Patient Active Problem List   Diagnosis Date Noted  . 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 Cocaine use disorder, moderate, dependence (HCC) 07/26/2016  . 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 Psychosis (HCC) 07/24/2016  . Chest pain 11/23/2015  . Asthma 11/23/2015  . Tobacco abuse 11/23/2015  . Elevated lactic acid level 11/23/2015  . Cocaine abuse (HCC)   . Concern about STD in male without diagnosis   . Paranoid ideation (HCC) 02/22/2015    History reviewed. No pertinent surgical history.     Home Medications    Prior to Admission medications   Medication Sig Start Date End Date Taking? Authorizing Provider  albuterol (PROVENTIL HFA;VENTOLIN HFA) 108 (90 Base) MCG/ACT inhaler Inhale 2 puffs into the lungs every 4 (four) hours as needed for wheezing or shortness of breath. Patient not taking: Reported on 03/01/2017 02/03/17   Muthersbaugh, Dahlia ClientHannah, PA-C  albuterol (PROVENTIL HFA;VENTOLIN HFA) 108 (90 Base) MCG/ACT inhaler Inhale 2 puffs into the lungs every 4 (four) hours as needed for wheezing or shortness of breath. 03/16/17   Joy, Shawn C, PA-C  azithromycin (ZITHROMAX Z-PAK) 250 MG tablet Take 2 po the first  day then once a day for the next 4 days. Patient not taking: Reported on 03/15/2017 03/12/17   Devoria AlbeKnapp, Iva, MD  benzonatate (TESSALON) 100 MG capsule Take 1 capsule (100 mg total) by mouth every 8 (eight) hours. 03/16/17   Joy, Shawn C, PA-C  benzonatate (TESSALON) 100 MG capsule Take 1 capsule (100 mg total) by mouth every 8 (eight) hours. 03/18/17   Tegeler, Canary Brimhristopher J, MD  diclofenac (VOLTAREN) 75 MG EC tablet Take 1 tablet (75 mg total) by mouth 2 (two) times daily. Patient not taking: Reported on 03/01/2017 02/09/17   Elson AreasSofia, Leslie K, PA-C  DM-Phenylephrine-Acetaminophen (TYLENOL COLD MAX) 10-5-325 MG/15ML LIQD Take 15 mLs by mouth every 6 (six) hours as needed (cough, cold).    [provider]  fluticasone (FLONASE) 50 MCG/ACT nasal spray Place 2 sprays into both nostrils daily. Patient not taking: Reported on 03/01/2017 02/03/17   Muthersbaugh, Dahlia ClientHannah, PA-C  guaiFENesin-codeine 100-10 MG/5ML syrup Take 5 mLs by mouth 3 (three) times daily as needed for cough. Patient not taking: Reported on 03/01/2017 02/03/17   Janne NapoleonNeese, Hope M, NP  ibuprofen (ADVIL,MOTRIN) 800 MG tablet Take 1 tablet (800 mg total) by mouth every 8 (eight) hours as needed. Patient not taking: Reported on 03/01/2017 02/02/17   Charlestine NightLawyer, Christopher, PA-C  naproxen (NAPROSYN) 500 MG tablet Take 1 tablet (500 mg total) by mouth 2 (two) times daily. Patient not taking: Reported on 03/15/2017 03/07/17   Derwood KaplanNanavati, Ankit, MD  omeprazole (PRILOSEC) 20 MG capsule Take 1 capsule (20 mg total) by mouth daily. 03/01/17   Horton, Mayer Masker, MD  oseltamivir (TAMIFLU) 75 MG capsule Take 1 capsule (75 mg total) by mouth every 12 (twelve) hours. Patient not taking: Reported on 03/15/2017 03/12/17   Devoria Albe, MD  oseltamivir (TAMIFLU) 75 MG capsule Take 1 capsule (75 mg total) by mouth every 12 (twelve) hours. 03/18/17   Tegeler, Canary Brim, MD  predniSONE (STERAPRED UNI-PAK 21 TAB) 10 MG (21) TBPK tablet Take 6 tabs (60mg ) on day 1, 5 tabs (50mg ) on  day 2, 4 tabs (40mg ) on day 3, 3 tabs (30mg ) on day 4, 2 tabs (20mg ) on day 5, and 1 tab (10mg ) on day 6. 03/16/17   Joy, Shawn C, PA-C  promethazine-dextromethorphan (PROMETHAZINE-DM) 6.25-15 MG/5ML syrup Take 10 mLs by mouth 4 (four) times daily as needed for cough. Patient not taking: Reported on 03/01/2017 02/02/17   Charlestine Night, PA-C  terbinafine (LAMISIL AT) 1 % cream Apply 1 application topically 2 (two) times daily. Patient not taking: Reported on 03/01/2017 02/20/17   Maczis, Elmer Sow, PA-C    Family History History reviewed. No pertinent family history.  Social History Social History   Tobacco Use  . Smoking status: Current Every Day Smoker    Packs/day: 1.00    Years: 0.00    Pack years: 0.00    Types: Cigarettes, Cigars  . Smokeless tobacco: Never Used  Substance Use Topics  . Alcohol use: Yes  . Drug use: No    Comment: last use October 2017     Allergies   Haldol [haloperidol lactate]   Review of Systems Review of Systems  All other systems reviewed and are negative.    Physical Exam Updated Vital Signs BP (!) 134/95 (BP Location: Left Arm)   Pulse 78   Temp 97.8 F (36.6 C) (Oral)   Resp 18   Ht 5\' 7"  (1.702 m)   Wt 72.2 kg (159 lb 3.2 oz)   SpO2 96%   BMI 24.93 kg/m   Physical Exam  Nursing note and vitals reviewed.  42 year old male, resting comfortably and in no acute distress. Vital signs are significant for mildly elevated diastolic blood pressure. Oxygen saturation is 96%, which is normal. Head is normocephalic and atraumatic. PERRLA, EOMI. Oropharynx is clear. Neck is nontender and supple without adenopathy or JVD. Back is mildly tender diffusely without any point tenderness.  There is no CVA tenderness. Lungs are clear without rales, wheezes, or rhonchi. Chest is nontender. Heart has regular rate and rhythm without murmur. Abdomen is soft, flat, nontender without masses or hepatosplenomegaly and peristalsis is  normoactive. Extremities have no cyanosis or edema, full range of motion is present. Skin is warm and dry without rash. Neurologic: Mental status is normal, cranial nerves are intact, there are no motor or sensory deficits.  ED Treatments / Results   Procedures Procedures   Medications Ordered in ED Medications  ibuprofen (ADVIL,MOTRIN) tablet 800 mg (not administered)     Initial Impression / Assessment and Plan / ED Course  I have reviewed the triage vital signs and the nursing notes.  Report of fall with back pain.  Patient was sleeping when I went in to evaluate him and he is in absolutely no distress in spite of complaining of pain at 10/10.  Review of old records shows 69 ED visits this month.  No evidence of any significant injury from his fall and I do not  see any indication for imaging.  He is given a dose of ibuprofen and discharged with prescription for ibuprofen.  Final Clinical Impressions(s) / ED Diagnoses   Final diagnoses:  Fall from slip, trip, or stumble, initial encounter  Acute back pain, unspecified back location, unspecified back pain laterality    ED Discharge Orders        Ordered    ibuprofen (ADVIL,MOTRIN) 600 MG tablet  Every 6 hours PRN     03/20/17 0123       Dione Booze, MD 03/20/17 0129

## 2017-03-20 NOTE — ED Triage Notes (Signed)
Pt reports a headache. He also states that his throat is still sore from when he had the flu last week.

## 2017-03-21 ENCOUNTER — Emergency Department (HOSPITAL_COMMUNITY)
Admission: EM | Admit: 2017-03-21 | Discharge: 2017-03-21 | Disposition: A | Discharge status: Home / Self Care | Payer: Self-pay | Attending: Emergency Medicine | Admitting: Emergency Medicine

## 2017-03-21 ENCOUNTER — Encounter (HOSPITAL_COMMUNITY): Payer: Self-pay | Admitting: Emergency Medicine

## 2017-03-21 DIAGNOSIS — R05 Cough: Secondary | ICD-10-CM | POA: Insufficient documentation

## 2017-03-21 DIAGNOSIS — R51 Headache: Secondary | ICD-10-CM

## 2017-03-21 DIAGNOSIS — R519 Headache, unspecified: Secondary | ICD-10-CM

## 2017-03-21 DIAGNOSIS — Z5321 Procedure and treatment not carried out due to patient leaving prior to being seen by health care provider: Secondary | ICD-10-CM | POA: Insufficient documentation

## 2017-03-21 DIAGNOSIS — R059 Cough, unspecified: Secondary | ICD-10-CM

## 2017-03-21 DIAGNOSIS — J029 Acute pharyngitis, unspecified: Secondary | ICD-10-CM

## 2017-03-21 LAB — RAPID STREP SCREEN (MED CTR MEBANE ONLY): STREPTOCOCCUS, GROUP A SCREEN (DIRECT): NEGATIVE

## 2017-03-21 MED ORDER — IBUPROFEN 200 MG PO TABS
600.0000 mg | ORAL_TABLET | Freq: Once | ORAL | Status: DC
  Filled 2017-03-21: qty 3

## 2017-03-21 MED ORDER — LIDOCAINE VISCOUS 2 % MT SOLN: 20.0000 mL | OROMUCOSAL | 0 refills | Status: DC | PRN

## 2017-03-21 MED ORDER — NAPROXEN 500 MG PO TABS
500.0000 mg | ORAL_TABLET | Freq: Once | ORAL | Status: AC
  Administered 2017-03-21: 500 mg via ORAL
  Filled 2017-03-21: qty 1

## 2017-03-21 MED ORDER — GUAIFENESIN-CODEINE 100-10 MG/5ML PO SOLN
10.0000 mL | Freq: Once | ORAL | Status: DC
  Filled 2017-03-21: qty 10

## 2017-03-21 NOTE — ED Provider Notes (Signed)
Blood pressure (!) 146/97, pulse 87, temperature 98.7 F (37.1 C), temperature source Oral, resp. rate 16, SpO2 100 %.  Patient was given a medical screening exam.  He is well-known to the emergency room 71st ED visit in the past 6 months.  He is complaining of a cough and sore throat today.  Is been seen recently for similar complaints.  He is afebrile.  Nontoxic.  His oropharynx looks clear to me.  He has normal sounding voice his neck is supple.  Lungs are clear bilaterally he has no increased work of breathing.  I doubt emergent medical condition.  He is given a dose of ibuprofen and cough medicine prior to discharge.  He is encouraged to follow-up with his PCP urgent care for similar complaints in the future.  Raeford RazorKohut, Hunter Bachar, MD 03/21/17 [phone]

## 2017-03-21 NOTE — ED Triage Notes (Signed)
Patent c/o cough and sore throat. Requesting cough syrup.

## 2017-03-21 NOTE — ED Provider Notes (Signed)
[facility]-EMERGENCY DEPT Provider Note   CSN: [id number] Arrival date & time: 03/20/17  1924     History   Chief Complaint Chief Complaint  Patient presents with  . Headache    HPI Dustin Norris is a 42 y.o. male.  HPI 42 year old African-American male 42 year old African-American male past medical history significant for substance abuse, anxiety, chronic headaches, homelessness presents to the emergency department today for evaluation of headache and sore throat.  Patient states she was diagnosed with the flu last week continues to have sore throat.  He states that his headache started today.  The headache is generalized and intermittent.  Gradual in onset.  Denies any red flag symptoms including fever, chills.  Patient has been seen several times in the ED for same.  Specifically patient has been seen 71 times in the ED for same.  Patient denies any associated fever, numbness, tingling, dizziness, vision changes, lightheadedness, syncope, rhinorrhea, chest pain, shortness of breath, productive cough, otalgia. Past Medical History:  Diagnosis Date  . Anxiety   . Asthma   . Cocaine abuse (HCC)   . Homelessness   . Migraine   . Tobacco abuse     Patient Active Problem List   Diagnosis Date Noted  . 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 Cocaine use disorder, moderate, dependence (HCC) 07/26/2016  . 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 Psychosis (HCC) 07/24/2016  . Chest pain 11/23/2015  . Asthma 11/23/2015  . Tobacco abuse 11/23/2015  . Elevated lactic acid level 11/23/2015  . Cocaine abuse (HCC)   . Concern about STD in male without diagnosis   . Paranoid ideation (HCC) 02/22/2015    History reviewed. No pertinent surgical history.     Home Medications    Prior to Admission medications   Medication Sig Start Date End Date Taking? Authorizing Provider  albuterol (PROVENTIL  HFA;VENTOLIN HFA) 108 (90 Base) MCG/ACT inhaler Inhale 2 puffs into the lungs every 4 (four) hours as needed for wheezing or shortness of breath. 03/16/17   Joy, Shawn C, PA-C  benzonatate (TESSALON) 100 MG capsule Take 1 capsule (100 mg total) by mouth every 8 (eight) hours. 03/16/17   Joy, Shawn C, PA-C  benzonatate (TESSALON) 100 MG capsule Take 1 capsule (100 mg total) by mouth every 8 (eight) hours. 03/18/17   Tegeler, Canary Brimhristopher J, MD  DM-Phenylephrine-Acetaminophen (TYLENOL COLD MAX) 10-5-325 MG/15ML LIQD Take 15 mLs by mouth every 6 (six) hours as needed (cough, cold).    [provider]  ibuprofen (ADVIL,MOTRIN) 600 MG tablet Take 1 tablet (600 mg total) by mouth every 6 (six) hours as needed. 03/20/17   Dione BoozeGlick, David, MD  omeprazole (PRILOSEC) 20 MG capsule Take 1 capsule (20 mg total) by mouth daily. 03/01/17   Horton, Mayer Maskerourtney F, MD  predniSONE (STERAPRED UNI-PAK 21 TAB) 10 MG (21) TBPK tablet Take 6 tabs (60mg ) on day 1, 5 tabs (50mg ) on day 2, 4 tabs (40mg ) on day 3, 3 tabs (30mg ) on day 4, 2 tabs (20mg ) on day 5, and 1 tab (10mg ) on day 6. 03/16/17   Joy, Hillard DankerShawn C, PA-C    Family History History reviewed. No pertinent family history.  Social History Social History   Tobacco Use  . Smoking status: Current Every Day Smoker    Packs/day: 1.00    Years: 0.00    Pack years: 0.00    Types: Cigarettes, Cigars  . Smokeless tobacco: Never Used  Substance Use  Topics  . Alcohol use: Yes  . Drug use: No    Comment: last use October 2017     Allergies   Haldol [haloperidol lactate]   Review of Systems Review of Systems  All other systems reviewed and are negative.    Physical Exam Updated Vital Signs BP (!) 121/93   Pulse 67   Temp 97.6 F (36.4 C) (Oral)   Resp 14   SpO2 94%   Physical Exam  Constitutional: He is oriented to person, place, and time. He appears well-developed and well-nourished. No distress.  HENT:  Head: Normocephalic and atraumatic.  Eyes:  EOM are normal. Pupils are equal, round, and reactive to light. Right eye exhibits no discharge. Left eye exhibits no discharge. No scleral icterus.  Neck: Normal range of motion. Neck supple.  No c spine midline tenderness. No paraspinal tenderness. No deformities or step offs noted. Full ROM. Supple. No nuchal rigidity.    Cardiovascular: Normal rate and regular rhythm.  Pulmonary/Chest: Effort normal and breath sounds normal. No stridor. No respiratory distress. He has no wheezes. He has no rales. He exhibits no tenderness.  Musculoskeletal: Normal range of motion.  Neurological: He is alert and oriented to person, place, and time. GCS eye subscore is 4. GCS verbal subscore is 5. GCS motor subscore is 6.  Moving all extremities any difficulties.  Speaking complete sentences.  Skin: Skin is warm and dry. Capillary refill takes less than 2 seconds. No pallor.  Psychiatric: His behavior is normal. Judgment and thought content normal.  Nursing note and vitals reviewed.    ED Treatments / Results  Labs (all labs ordered are listed, but only abnormal results are displayed) Labs Reviewed  RAPID STREP SCREEN (NOT AT [facility])  CULTURE, GROUP A STREP [facility])    EKG  EKG Interpretation None       Radiology No results found.  Procedures Procedures (including critical care time)  Medications Ordered in ED Medications  naproxen (NAPROSYN) tablet 500 mg (500 mg Oral Given 03/21/17 9629)     Initial Impression / Assessment and Plan / ED Course  I have reviewed the triage vital signs and the nursing notes.  Pertinent labs & imaging results that were available during my care of the patient were reviewed by me and considered in my medical decision making (see chart for details).     Pt afebrile without tonsillar exudate, negative strep. Diagnosis of viral pharyngitis. Pt presents with ha. History of chronic headaches.  No red flag symptoms that be concerning for [facility], [facility], meningitis.   Patient presents the ED total times multiple complaints.  No abx indicated. DC w symptomatic tx for pain  Pt does not appear dehydrated, but did discuss importance of water rehydration. Presentation non concerning for PTA or infxn spread to soft tissue. No trismus or uvula deviation. Specific return precautions discussed. Pt able to drink water in ED without difficulty with intact air way. Recommended PCP follow up.3  Pt is hemodynamically stable, in NAD, & able to ambulate in the ED. Evaluation does not show pathology that would require ongoing emergent intervention or inpatient treatment. I explained the diagnosis to the patient. Pain has been managed & has no complaints prior to dc. Pt is comfortable with above plan and is stable for discharge at this time. All questions were answered prior to disposition. Strict return precautions for f/u to the ED were discussed. Encouraged follow up with PCP.    Final Clinical Impressions(s) / ED Diagnoses  Final diagnoses:  Sore throat  Chronic nonintractable headache, unspecified headache type    ED Discharge Orders    None      Wallace Keller 03/21/17 0854  Dione Booze, MD 03/21/17 2234

## 2017-03-21 NOTE — ED Notes (Signed)
Patient ambulatory with steady gait to room from lobby 

## 2017-03-21 NOTE — ED Notes (Signed)
Pt called for room. Pt asleep in lobby.

## 2017-03-21 NOTE — Discharge Instructions (Signed)
Your strep test was negative.  Please take Motrin and Tylenol for pain.  Use the viscous lidocaine to help numb your throat.  Follow-up primary care doctor return to the ED with any worsening symptoms.

## 2017-03-21 NOTE — ED Notes (Signed)
Pt sleeping soundly in the lobby.

## 2017-03-21 NOTE — ED Notes (Signed)
Pt left before receiving his medications, medicines were pulled ans RN will try to find him

## 2017-03-23 ENCOUNTER — Emergency Department (HOSPITAL_COMMUNITY)
Admission: EM | Admit: 2017-03-23 | Discharge: 2017-03-23 | Disposition: A | Discharge status: Home / Self Care | Payer: Self-pay | Attending: Emergency Medicine | Admitting: Emergency Medicine

## 2017-03-23 ENCOUNTER — Encounter (HOSPITAL_COMMUNITY): Payer: Self-pay

## 2017-03-23 ENCOUNTER — Other Ambulatory Visit: Payer: Self-pay

## 2017-03-23 DIAGNOSIS — M542 Cervicalgia: Secondary | ICD-10-CM | POA: Insufficient documentation

## 2017-03-23 DIAGNOSIS — R05 Cough: Secondary | ICD-10-CM | POA: Insufficient documentation

## 2017-03-23 DIAGNOSIS — F17210 Nicotine dependence, cigarettes, uncomplicated: Secondary | ICD-10-CM | POA: Insufficient documentation

## 2017-03-23 DIAGNOSIS — J029 Acute pharyngitis, unspecified: Secondary | ICD-10-CM | POA: Insufficient documentation

## 2017-03-23 DIAGNOSIS — R110 Nausea: Secondary | ICD-10-CM | POA: Insufficient documentation

## 2017-03-23 LAB — CULTURE, GROUP A STREP (THRC)

## 2017-03-23 MED ORDER — ONDANSETRON 4 MG PO TBDP
4.0000 mg | ORAL_TABLET | Freq: Once | ORAL | Status: AC
  Administered 2017-03-23: 4 mg via ORAL
  Filled 2017-03-23: qty 1

## 2017-03-23 MED ORDER — IBUPROFEN 400 MG PO TABS: 400.0000 mg | ORAL_TABLET | Freq: Four times a day (QID) | ORAL | 0 refills | Status: DC | PRN

## 2017-03-23 MED ORDER — IBUPROFEN 800 MG PO TABS
800.0000 mg | ORAL_TABLET | Freq: Once | ORAL | Status: AC
  Administered 2017-03-23: 800 mg via ORAL
  Filled 2017-03-23: qty 1

## 2017-03-23 NOTE — ED Provider Notes (Addendum)
[facility]-EMERGENCY DEPT Provider Note   CSN: [id number] Arrival date & time: 03/23/17  1554     History   Chief Complaint No chief complaint on file.   HPI Coleson Kant is a 42 y.o. male.  HPI   Pt is a36 y/o male who presents to the ED today c/o a sore throat and cough for the last few days.  He was here for similar symptoms a few days ago and states that his symptoms feel exactly the same as when he was here last. Sore throat is not worse and no changes to voice. No difficulty swallowing.  He is also complaining of some bilateral neck pain as well as pain to his bilateral trapezius muscles.  Denies any recent falls or trauma.  Denies any neck stiffness.  Denies any fevers, vision changes, or eye pain.  Denies rhinorrhea, congestion, nasal pressure, sinus pressure. No SOB, CP, abd pain, vomiting, diarrhea, urinary sxs, vision changes, headaches, myalgias. Took ibuprofen with some relief. Cough medicine is not helping. Does report nausea, no episodes of vomiting.  Past Medical History:  Diagnosis Date  . Anxiety   . Asthma   . Cocaine abuse (HCC)   . Homelessness   . Migraine   . Tobacco abuse     Patient Active Problem List   Diagnosis Date Noted  . 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 Cocaine use disorder, moderate, dependence (HCC) 07/26/2016  . 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 Psychosis (HCC) 07/24/2016  . Chest pain 11/23/2015  . Asthma 11/23/2015  . Tobacco abuse 11/23/2015  . Elevated lactic acid level 11/23/2015  . Cocaine abuse (HCC)   . Concern about STD in male without diagnosis   . Paranoid ideation (HCC) 02/22/2015    No past surgical history on file.     Home Medications    Prior to Admission medications   Medication Sig Start Date End Date Taking? Authorizing Provider  albuterol (PROVENTIL HFA;VENTOLIN HFA) 108 (90 Base) MCG/ACT inhaler Inhale 2  puffs into the lungs every 4 (four) hours as needed for wheezing or shortness of breath. 03/16/17   Joy, Shawn C, PA-C  benzonatate (TESSALON) 100 MG capsule Take 1 capsule (100 mg total) by mouth every 8 (eight) hours. 03/16/17   Joy, Shawn C, PA-C  benzonatate (TESSALON) 100 MG capsule Take 1 capsule (100 mg total) by mouth every 8 (eight) hours. 03/18/17   Tegeler, Canary Brim, MD  DM-Phenylephrine-Acetaminophen (TYLENOL COLD MAX) 10-5-325 MG/15ML LIQD Take 15 mLs by mouth every 6 (six) hours as needed (cough, cold).    [provider]  ibuprofen (ADVIL,MOTRIN) 600 MG tablet Take 1 tablet (600 mg total) by mouth every 6 (six) hours as needed. 03/20/17   Dione Booze, MD  lidocaine (XYLOCAINE) 2 % solution Use as directed 20 mLs in the mouth or throat as needed for mouth pain. 03/21/17   Rise Mu, PA-C  omeprazole (PRILOSEC) 20 MG capsule Take 1 capsule (20 mg total) by mouth daily. 03/01/17   Horton, Mayer Masker, MD  predniSONE (STERAPRED UNI-PAK 21 TAB) 10 MG (21) TBPK tablet Take 6 tabs (60mg ) on day 1, 5 tabs (50mg ) on day 2, 4 tabs (40mg ) on day 3, 3 tabs (30mg ) on day 4, 2 tabs (20mg ) on day 5, and 1 tab (10mg ) on day 6. 03/16/17   Joy, Hillard Danker, PA-C    Family History No family history on file.  Social History Social History  Tobacco Use  . Smoking status: Current Every Day Smoker    Packs/day: 1.00    Years: 0.00    Pack years: 0.00    Types: Cigarettes, Cigars  . Smokeless tobacco: Never Used  Substance Use Topics  . Alcohol use: Yes  . Drug use: No    Comment: last use October 2017     Allergies   Haldol [haloperidol lactate]   Review of Systems Review of Systems  Constitutional: Negative for chills and fever.  HENT: Positive for sore throat. Negative for congestion, ear pain, postnasal drip, rhinorrhea and trouble swallowing.   Eyes: Negative for pain and visual disturbance.  Respiratory: Negative for cough and shortness of breath.   Cardiovascular:  Negative for chest pain and palpitations.  Gastrointestinal: Positive for nausea. Negative for abdominal pain, constipation, diarrhea and vomiting.  Genitourinary: Negative for dysuria and hematuria.  Musculoskeletal: Positive for neck pain. Negative for neck stiffness.  Skin: Negative for color change and rash.  Neurological: Negative for dizziness, light-headedness and headaches.  All other systems reviewed and are negative.    Physical Exam Updated Vital Signs BP (!) 128/99 (BP Location: Right Arm)   Pulse 69   Temp 99.2 F (37.3 C) (Oral)   Resp 18   SpO2 100%   Physical Exam  Constitutional: He is oriented to person, place, and time. He appears well-developed and well-nourished.  HENT:  Head: Normocephalic and atraumatic.  Right Ear: Tympanic membrane normal.  Left Ear: Tympanic membrane normal.  Mouth/Throat: Uvula is midline and oropharynx is clear and moist. No oral lesions. No uvula swelling. No oropharyngeal exudate, posterior oropharyngeal edema, posterior oropharyngeal erythema or tonsillar abscesses.  No hot potato voice.  Tolerating secretions.  No tonsillar swelling or exudates.  Eyes: Conjunctivae are normal.  Neck: Normal range of motion. Neck supple.  No nuchal rigidity, full range of motion of the neck with flexion to chin to chest.  Bilateral paraspinous tenderness as well as tenderness over the trapezius muscles bilaterally.  Cardiovascular: Normal rate, regular rhythm and normal heart sounds.  No murmur heard. Pulmonary/Chest: Effort normal and breath sounds normal. No respiratory distress. He has no wheezes. He has no rhonchi. He has no rales.  Abdominal: Soft. Bowel sounds are normal. He exhibits no distension. There is no tenderness.  Musculoskeletal: He exhibits no edema.  Neurological: He is alert and oriented to person, place, and time.  Cranial nerves 2-12 intact  Skin: Skin is warm and dry.  Psychiatric: He has a normal mood and affect.  Nursing  note and vitals reviewed.    ED Treatments / Results  Labs (all labs ordered are listed, but only abnormal results are displayed) Labs Reviewed - No data to display  EKG  EKG Interpretation None       Radiology No results found.  Procedures Procedures (including critical care time)  Medications Ordered in ED Medications - No data to display   Initial Impression / Assessment and Plan / ED Course  I have reviewed the triage vital signs and the nursing notes.  Pertinent labs & imaging results that were available during my care of the patient were reviewed by me and considered in my medical decision making (see chart for details).     Final Clinical Impressions(s) / ED Diagnoses   Final diagnoses:  None   Patients symptoms are consistent with URI, likely viral etiology. Has been seen in ED 72 times in the last 6 months nad was here 2 days ago with  similar sxs. C/o neck pain, no midline ttp, ttp to paraspinous muscles bilaterally. No nuchal rigidity or meningeal signs to suggest meningitis. no recent falls or trauma. Likely muscle strain. No episodes of vomiting in the ED, tolerating pos.  Pt will be discharged with symptomatic treatment.  Verbalizes understanding and is agreeable with plan. Pt is hemodynamically stable, nontoxic appearing & in NAD prior to dc.   ED Discharge Orders    None       Rayne Du 03/23/17 1940    Rayne Du 03/23/17 1942    Maia Plan, MD 03/24/17 [phone]

## 2017-03-23 NOTE — ED Triage Notes (Signed)
Pt is c/o sore throat and neck pain with an  associated cough.

## 2017-03-23 NOTE — Discharge Instructions (Signed)
If you were given a prescription, please take the prescription as you were instructed and follow the directions given on the discharge paperwork.   Over the next several days you should rest as much as possible, and drink more fluids than usual. Liquids will help thin and loosen mucus so you can cough it up. Liquids will also help prevent dehydration.   To help soothe a sore throat gargle with warm salt water.  Make salt water by dissolving  teaspoon salt in 1 cup warm water. You may also use throat lozenges and over the counter sore throat spray.  Please follow up with your primary care provider within 5-7 days for re-evaluation of your symptoms. If you do not have a primary care provider, information for a healthcare clinic has been provided for you to make arrangements for follow up care. Please return to the emergency department for any persistent fevers, worsening sore throat/hoarse voice, inability to swallow, persistent vomiting, chest pain, shortness of breath, coughing up blood, or any new or worsening symptoms.

## 2017-03-24 ENCOUNTER — Other Ambulatory Visit: Payer: Self-pay

## 2017-03-24 ENCOUNTER — Encounter (HOSPITAL_COMMUNITY): Payer: Self-pay

## 2017-03-24 DIAGNOSIS — M542 Cervicalgia: Secondary | ICD-10-CM | POA: Insufficient documentation

## 2017-03-24 DIAGNOSIS — Z590 Homelessness: Secondary | ICD-10-CM | POA: Insufficient documentation

## 2017-03-24 DIAGNOSIS — J45909 Unspecified asthma, uncomplicated: Secondary | ICD-10-CM | POA: Insufficient documentation

## 2017-03-24 DIAGNOSIS — F17210 Nicotine dependence, cigarettes, uncomplicated: Secondary | ICD-10-CM | POA: Insufficient documentation

## 2017-03-24 DIAGNOSIS — Z79899 Other long term (current) drug therapy: Secondary | ICD-10-CM | POA: Insufficient documentation

## 2017-03-24 NOTE — ED Notes (Signed)
Pt called for triage x1 

## 2017-03-24 NOTE — ED Triage Notes (Signed)
Pt reports headache. Consistent with his normal headache.

## 2017-03-25 ENCOUNTER — Emergency Department (HOSPITAL_COMMUNITY)
Admission: EM | Admit: 2017-03-25 | Discharge: 2017-03-25 | Disposition: A | Discharge status: Home / Self Care | Payer: Self-pay | Attending: Emergency Medicine | Admitting: Emergency Medicine

## 2017-03-25 ENCOUNTER — Other Ambulatory Visit: Payer: Self-pay

## 2017-03-25 ENCOUNTER — Encounter (HOSPITAL_COMMUNITY): Payer: Self-pay | Admitting: Emergency Medicine

## 2017-03-25 ENCOUNTER — Encounter (HOSPITAL_COMMUNITY): Payer: Self-pay

## 2017-03-25 DIAGNOSIS — G43909 Migraine, unspecified, not intractable, without status migrainosus: Secondary | ICD-10-CM

## 2017-03-25 DIAGNOSIS — G43809 Other migraine, not intractable, without status migrainosus: Secondary | ICD-10-CM | POA: Insufficient documentation

## 2017-03-25 DIAGNOSIS — G43009 Migraine without aura, not intractable, without status migrainosus: Secondary | ICD-10-CM | POA: Insufficient documentation

## 2017-03-25 DIAGNOSIS — J45909 Unspecified asthma, uncomplicated: Secondary | ICD-10-CM | POA: Insufficient documentation

## 2017-03-25 DIAGNOSIS — F17210 Nicotine dependence, cigarettes, uncomplicated: Secondary | ICD-10-CM | POA: Insufficient documentation

## 2017-03-25 DIAGNOSIS — Z590 Homelessness: Secondary | ICD-10-CM | POA: Insufficient documentation

## 2017-03-25 DIAGNOSIS — M542 Cervicalgia: Secondary | ICD-10-CM

## 2017-03-25 MED ORDER — ACETAMINOPHEN 500 MG PO TABS
1000.0000 mg | ORAL_TABLET | Freq: Once | ORAL | Status: AC
  Administered 2017-03-25: 1000 mg via ORAL
  Filled 2017-03-25: qty 2

## 2017-03-25 MED ORDER — IBUPROFEN 200 MG PO TABS
600.0000 mg | ORAL_TABLET | Freq: Once | ORAL | Status: AC
  Administered 2017-03-25: 600 mg via ORAL
  Filled 2017-03-25: qty 3
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
## 2017-03-25 NOTE — ED Provider Notes (Signed)
[facility]-EMERGENCY DEPT Provider Note   CSN: [id number] Arrival date & time: 03/24/17  2048     History   Chief Complaint Chief Complaint  Patient presents with  . Headache    HPI Dustin Norris is a 42 y.o. male who presents today for evaluation.  He has been in the ED for 10 hours at the time of my evaluation sleeping with out distress.  He reports that his neck has been hurting for the past few days.  When I first asked him what was hurting he said  "my back, no my chest, no my neck, no my head, no its my neck is hurting."  He denies any visual changes.  No fevers or chills.  He denies any trauma recently. He says head does not hurt, despite that being what he reported to triage nurse.  He has tried tylenol with mild relief.      HPI  Past Medical History:  Diagnosis Date  . Anxiety   . Asthma   . Cocaine abuse (HCC)   . Homelessness   . Migraine   . Tobacco abuse     Patient Active Problem List   Diagnosis Date Noted  . 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 Cocaine use disorder, moderate, dependence (HCC) 07/26/2016  . 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 Psychosis (HCC) 07/24/2016  . Chest pain 11/23/2015  . Asthma 11/23/2015  . Tobacco abuse 11/23/2015  . Elevated lactic acid level 11/23/2015  . Cocaine abuse (HCC)   . Concern about STD in male without diagnosis   . Paranoid ideation (HCC) 02/22/2015    History reviewed. No pertinent surgical history.     Home Medications    Prior to Admission medications   Medication Sig Start Date End Date Taking? Authorizing Provider  acetaminophen (TYLENOL) 500 MG tablet Take 1,000 mg by mouth every 6 (six) hours as needed for moderate pain.   Yes [provider]  albuterol (PROVENTIL HFA;VENTOLIN HFA) 108 (90 Base) MCG/ACT inhaler Inhale 2 puffs into the lungs every 4 (four) hours as needed for wheezing or shortness  of breath. Patient not taking: Reported on 03/25/2017 03/16/17   Anselm PancoastJoy, Shawn C, PA-C  benzonatate (TESSALON) 100 MG capsule Take 1 capsule (100 mg total) by mouth every 8 (eight) hours. Patient not taking: Reported on 03/25/2017 03/16/17   Anselm PancoastJoy, Shawn C, PA-C  benzonatate (TESSALON) 100 MG capsule Take 1 capsule (100 mg total) by mouth every 8 (eight) hours. 03/18/17   Tegeler, Canary Brimhristopher J, MD  ibuprofen (ADVIL,MOTRIN) 400 MG tablet Take 1 tablet (400 mg total) by mouth every 6 (six) hours as needed. Patient not taking: Reported on 03/25/2017 03/23/17   Couture, Cortni S, PA-C  lidocaine (XYLOCAINE) 2 % solution Use as directed 20 mLs in the mouth or throat as needed for mouth pain. Patient not taking: Reported on 03/25/2017 03/21/17   Demetrios LollLeaphart, Kenneth T, PA-C  omeprazole (PRILOSEC) 20 MG capsule Take 1 capsule (20 mg total) by mouth daily. Patient not taking: Reported on 03/25/2017 03/01/17   Horton, Mayer Maskerourtney F, MD  predniSONE (STERAPRED UNI-PAK 21 TAB) 10 MG (21) TBPK tablet Take 6 tabs (60mg ) on day 1, 5 tabs (50mg ) on day 2, 4 tabs (40mg ) on day 3, 3 tabs (30mg ) on day 4, 2 tabs (20mg ) on day 5, and 1 tab (10mg ) on day 6. Patient not taking: Reported on 03/25/2017 03/16/17   Anselm PancoastJoy, Shawn C, PA-C  Family History History reviewed. No pertinent family history.  Social History Social History   Tobacco Use  . Smoking status: Current Every Day Smoker    Packs/day: 1.00    Years: 0.00    Pack years: 0.00    Types: Cigarettes, Cigars  . Smokeless tobacco: Never Used  Substance Use Topics  . Alcohol use: Yes  . Drug use: No    Comment: last use October 2017     Allergies   Haldol [haloperidol lactate]   Review of Systems Review of Systems  Respiratory: Negative for cough.   Cardiovascular: Negative for chest pain.  Gastrointestinal: Negative for abdominal pain, nausea and vomiting.  Musculoskeletal: Positive for neck pain. Negative for back pain, myalgias and neck stiffness.  Skin:  Negative for pallor, rash and wound.  Neurological: Negative for headaches.  All other systems reviewed and are negative.    Physical Exam Updated Vital Signs BP (!) 124/93 (BP Location: Right Arm)   Pulse 69   Temp 98.4 F (36.9 C) (Oral)   Resp 17   SpO2 100%   Physical Exam  Constitutional: He is oriented to person, place, and time. He appears well-developed and well-nourished.  Non-toxic appearance. He does not appear ill. No distress.  HENT:  Head: Normocephalic and atraumatic.  Mouth/Throat: Oropharynx is clear and moist.  Neck: Normal range of motion. Neck supple.  Full ROM with out pain.  Mild paraspinal muscle TTP bilaterally.  No midline tenderness.  No neck swelling.    Pulmonary/Chest: Effort normal. No respiratory distress.  Neurological: He is alert and oriented to person, place, and time.  Skin: Skin is warm and dry.  Nursing note and vitals reviewed.    ED Treatments / Results  Labs (all labs ordered are listed, but only abnormal results are displayed) Labs Reviewed - No data to display  EKG  EKG Interpretation None       Radiology No results found.  Procedures Procedures (including critical care time)  Medications Ordered in ED Medications  ibuprofen (ADVIL,MOTRIN) tablet 600 mg (600 mg Oral Given 03/25/17 0657)     Initial Impression / Assessment and Plan / ED Course  I have reviewed the triage vital signs and the nursing notes.  Pertinent labs & imaging results that were available during my care of the patient were reviewed by me and considered in my medical decision making (see chart for details).    [name] presents today for evaluation of neck pain.  He has 73 visits in the past 6 months with no admissions.  He has paraspinal muscle tenderness to palpation.  He has been trying Tylenol, no ibuprofen.  He will be given a dose of ibuprofen.  He is afebrile, no obvious distress.  Neck is supple, full ROM, I do not suspect  meningitis, no history of trauma, CT scan not indicated.  Suspect paraspinal muscle spasm.  DC with supportive care /OTC meds.    Final Clinical Impressions(s) / ED Diagnoses   Final diagnoses:  Neck pain    ED Discharge Orders    None       Norman Clay 03/25/17 1610    Pricilla Loveless, MD 03/26/17 [phone]

## 2017-03-25 NOTE — Discharge Instructions (Signed)
You can take Tylenol or Ibuprofen as directed for pain. You can alternate Tylenol and Ibuprofen every 4 hours. If you take Tylenol at 1pm, then you can take Ibuprofen at 5pm. Then you can take Tylenol again at 9pm.   Follow-up with your primary care doctor in the next 24-48 hours for further evaluation.  Return to the emergency department for any fever, vision changes, difficulty walking, numbness/weakness of your arms or legs, worsening headache or any other worsening or concerning symptoms.

## 2017-03-25 NOTE — ED Notes (Signed)
Patient complains of headache on back of head and in his neck.

## 2017-03-25 NOTE — ED Triage Notes (Signed)
Pt complaint of migraine onset yesterday; denies associated symptoms.

## 2017-03-25 NOTE — Discharge Instructions (Signed)
You can take Tylenol or Ibuprofen as directed for pain. You can alternate Tylenol and Ibuprofen every 4 hours. If you take Tylenol at 1pm, then you can take Ibuprofen at 5pm. Then you can take Tylenol again at 9pm.   Follow-up with your primary care doctor in the next 24-48 hours for further evaluation.  Return to the ED for any worsening headache, vision changes, difficulty walking, numbness/weakness of your arms or legs or any other worsening or concerning symptoms.
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
## 2017-03-25 NOTE — ED Notes (Signed)
Patient discharged. Patient refused to sign and refused to take paperwork. Patient refused discharge vitals. Patient escorted off of the property by security.

## 2017-03-25 NOTE — Discharge Instructions (Signed)

## 2017-03-25 NOTE — ED Triage Notes (Signed)
Patient checking back in for headache that he previously was treated for and discharged. Patient in the waiting room acting belligerent and making statements like "If I had a gun, I'd kill all these people. Id kill all yall. Im for real." When security called due to patient threats, patient opted to check back in rather than leave the premises.

## 2017-03-26 ENCOUNTER — Other Ambulatory Visit: Payer: Self-pay

## 2017-03-26 ENCOUNTER — Emergency Department (HOSPITAL_COMMUNITY)
Admission: EM | Admit: 2017-03-26 | Discharge: 2017-03-27 | Disposition: A | Discharge status: Home / Self Care | Payer: Self-pay | Attending: Emergency Medicine | Admitting: Emergency Medicine

## 2017-03-26 ENCOUNTER — Encounter (HOSPITAL_COMMUNITY): Payer: Self-pay | Admitting: Emergency Medicine

## 2017-03-26 DIAGNOSIS — F17210 Nicotine dependence, cigarettes, uncomplicated: Secondary | ICD-10-CM | POA: Insufficient documentation

## 2017-03-26 DIAGNOSIS — F1410 Cocaine abuse, uncomplicated: Secondary | ICD-10-CM | POA: Insufficient documentation

## 2017-03-26 DIAGNOSIS — B353 Tinea pedis: Secondary | ICD-10-CM | POA: Insufficient documentation

## 2017-03-26 DIAGNOSIS — F419 Anxiety disorder, unspecified: Secondary | ICD-10-CM | POA: Insufficient documentation

## 2017-03-26 DIAGNOSIS — J45909 Unspecified asthma, uncomplicated: Secondary | ICD-10-CM | POA: Insufficient documentation

## 2017-03-26 DIAGNOSIS — Z590 Homelessness: Secondary | ICD-10-CM | POA: Insufficient documentation

## 2017-03-26 MED ORDER — CLOTRIMAZOLE 1 % EX CREA: TOPICAL_CREAM | CUTANEOUS | 0 refills | Status: DC

## 2017-03-26 NOTE — ED Triage Notes (Signed)
Pt co 10/10 bilateral feet pain due to blisters that he wants the ED provider to check for.

## 2017-03-26 NOTE — Discharge Instructions (Signed)
Use Lotrimin cream twice a day until symptoms resolve. Try to keep your feet clean and dry. Follow-up with your primary care doctor as needed. Return to the emergency room if you develop fevers, drainage from the feet, or any new or concerning symptoms.

## 2017-03-27 ENCOUNTER — Emergency Department (HOSPITAL_COMMUNITY)
Admission: EM | Admit: 2017-03-27 | Discharge: 2017-03-27 | Disposition: A | Discharge status: Home / Self Care | Payer: Self-pay | Attending: Emergency Medicine | Admitting: Emergency Medicine

## 2017-03-27 ENCOUNTER — Encounter (HOSPITAL_COMMUNITY): Payer: Self-pay

## 2017-03-27 DIAGNOSIS — B353 Tinea pedis: Secondary | ICD-10-CM | POA: Insufficient documentation

## 2017-03-27 NOTE — ED Provider Notes (Signed)
[name] Provider Note   CSN: [id number] Arrival date & time: 03/27/17  1955     History   Chief Complaint Chief Complaint  Patient presents with  . Recurrent Skin Infections    HPI Dustin Norris is a 42 y.o. male presenting for evaluation of bilateral foot irritation.   Pt seen and evaluated by me last night. Pt presents today with no changes in his sxs. He is having bilateral foot irritation and dryness. He has not used the lotrimin prescribed, as he states he cannot afford it. He has no new sxs, including pain, drainage, fevers, or chills.   HPI  Past Medical History:  Diagnosis Date  . Anxiety   . Asthma   . Cocaine abuse (HCC)   . Homelessness   . Migraine   . Tobacco abuse     Patient Active Problem List   Diagnosis Date Noted  . 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 Cocaine use disorder, moderate, dependence (HCC) 07/26/2016  . 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 Psychosis (HCC) 07/24/2016  . Chest pain 11/23/2015  . Asthma 11/23/2015  . Tobacco abuse 11/23/2015  . Elevated lactic acid level 11/23/2015  . Cocaine abuse (HCC)   . Concern about STD in male without diagnosis   . Paranoid ideation (HCC) 02/22/2015    History reviewed. No pertinent surgical history.     Home Medications    Prior to Admission medications   Medication Sig Start Date End Date Taking? Authorizing Provider  acetaminophen (TYLENOL) 500 MG tablet Take 1,000 mg by mouth every 6 (six) hours as needed for moderate pain.    [provider]  albuterol (PROVENTIL HFA;VENTOLIN HFA) 108 (90 Base) MCG/ACT inhaler Inhale 2 puffs into the lungs every 4 (four) hours as needed for wheezing or shortness of breath. Patient not taking: Reported on 03/25/2017 03/16/17   Anselm PancoastJoy, Shawn C, PA-C  benzonatate (TESSALON) 100 MG capsule Take 1 capsule (100 mg total) by mouth every 8  (eight) hours. Patient not taking: Reported on 03/25/2017 03/16/17   Anselm PancoastJoy, Shawn C, PA-C  benzonatate (TESSALON) 100 MG capsule Take 1 capsule (100 mg total) by mouth every 8 (eight) hours. 03/18/17   Tegeler, Canary Brimhristopher J, MD  clotrimazole (LOTRIMIN) 1 % cream Apply to affected area 2 times daily 03/26/17   Mariona Scholes, PA-C  ibuprofen (ADVIL,MOTRIN) 400 MG tablet Take 1 tablet (400 mg total) by mouth every 6 (six) hours as needed. Patient not taking: Reported on 03/25/2017 03/23/17   Couture, Cortni S, PA-C  lidocaine (XYLOCAINE) 2 % solution Use as directed 20 mLs in the mouth or throat as needed for mouth pain. Patient not taking: Reported on 03/25/2017 03/21/17   Demetrios LollLeaphart, Kenneth T, PA-C  omeprazole (PRILOSEC) 20 MG capsule Take 1 capsule (20 mg total) by mouth daily. Patient not taking: Reported on 03/25/2017 03/01/17   Horton, Mayer Maskerourtney F, MD  predniSONE (STERAPRED UNI-PAK 21 TAB) 10 MG (21) TBPK tablet Take 6 tabs (60mg ) on day 1, 5 tabs (50mg ) on day 2, 4 tabs (40mg ) on day 3, 3 tabs (30mg ) on day 4, 2 tabs (20mg ) on day 5, and 1 tab (10mg ) on day 6. Patient not taking: Reported on 03/25/2017 03/16/17   Anselm PancoastJoy, Shawn C, PA-C    Family History No family history on file.  Social History Social History   Tobacco Use  . Smoking status: Current Every Day Smoker    Packs/day: 1.00  Years: 0.00    Pack years: 0.00    Types: Cigarettes, Cigars  . Smokeless tobacco: Never Used  Substance Use Topics  . Alcohol use: Yes  . Drug use: No    Comment: last use October 2017     Allergies   Haldol [haloperidol lactate]   Review of Systems Review of Systems  Constitutional: Negative for chills and fever.  Skin:       Foot irritation and dryness     Physical Exam Updated Vital Signs BP (!) 146/104   Pulse 86   Temp 99 F (37.2 C)   Resp 18   SpO2 99%   Physical Exam  Constitutional: He is oriented to person, place, and time. He appears well-developed and well-nourished. No  distress.  HENT:  Head: Normocephalic and atraumatic.  Eyes: EOM are normal.  Neck: Normal range of motion.  Pulmonary/Chest: Effort normal.  Abdominal: He exhibits no distension.  Musculoskeletal: Normal range of motion.  Neurological: He is alert and oriented to person, place, and time.  Skin: Skin is warm and dry. No rash noted.  Bilateral foot skin dryness and irritation. No warmth or redness, no sign of cellulitis. No drainage. Unchanged from last night.   Psychiatric: He has a normal mood and affect.  Nursing note and vitals reviewed.    ED Treatments / Results  Labs (all labs ordered are listed, but only abnormal results are displayed) Labs Reviewed - No data to display  EKG  EKG Interpretation None       Radiology No results found.  Procedures Procedures (including critical care time)  Medications Ordered in ED Medications - No data to display   Initial Impression / Assessment and Plan / ED Course  I have reviewed the triage vital signs and the nursing notes.  Pertinent labs & imaging results that were available during my care of the patient were reviewed by me and considered in my medical decision making (see chart for details).     Pt presenting for evaluation of foot irritation and dryness. Sxs and physical exam unchanged from yesterday. Likely continued tinea pedis without superimposed infection. Discussed with pt. Suggested using lotion to help with dryness and itching, as his feet are very dry. Reiterated that he needs to use the lotrimin rx'd last night. At this time, pt appears safe for discharge. Return precautions given. Pt states he understands and agrees to plan.    Final Clinical Impressions(s) / ED Diagnoses   Final diagnoses:  None    ED Discharge Orders    None       Alveria Apley, PA-C 03/27/17 2129    Doug Sou, MD 03/28/17 [phone]

## 2017-03-27 NOTE — Discharge Instructions (Signed)
Use lotion for skin dryness and itching.  It is important that you use the Lotrimin cream prescribed yesterday for resolution of your symptoms.  Return to the ER if you develop any new or worsening symptoms.

## 2017-03-27 NOTE — ED Triage Notes (Signed)
Pt reports athletes foot for the past several days and is unable to afford over the counter cream

## 2017-03-27 NOTE — ED Provider Notes (Signed)
[facility] EMERGENCY DEPARTMENT Provider Note   CSN: [id number] Arrival date & time: 03/26/17  2236     History   Chief Complaint Chief Complaint  Patient presents with  . Blisters on feets    HPI Dustin Norris is a 42 y.o. male presenting for evaluation of his feet.   Pt states for the past 2 days, his feet have been itchy, irritated, and dry. He denies h/o similar. No new exposures. He denies sxs elsewhere on his body. He has not tried anything. Nothing makes it better or worse. No drainage, warmth, or swelling.   HPI  Past Medical History:  Diagnosis Date  . Anxiety   . Asthma   . Cocaine abuse (HCC)   . Homelessness   . Migraine   . Tobacco abuse     Patient Active Problem List   Diagnosis Date Noted  . 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 Cocaine use disorder, moderate, dependence (HCC) 07/26/2016  . 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 Psychosis (HCC) 07/24/2016  . Chest pain 11/23/2015  . Asthma 11/23/2015  . Tobacco abuse 11/23/2015  . Elevated lactic acid level 11/23/2015  . Cocaine abuse (HCC)   . Concern about STD in male without diagnosis   . Paranoid ideation (HCC) 02/22/2015    History reviewed. No pertinent surgical history.     Home Medications    Prior to Admission medications   Medication Sig Start Date End Date Taking? Authorizing Provider  acetaminophen (TYLENOL) 500 MG tablet Take 1,000 mg by mouth every 6 (six) hours as needed for moderate pain.    [provider]  albuterol (PROVENTIL HFA;VENTOLIN HFA) 108 (90 Base) MCG/ACT inhaler Inhale 2 puffs into the lungs every 4 (four) hours as needed for wheezing or shortness of breath. Patient not taking: Reported on 03/25/2017 03/16/17   Anselm Pancoast, PA-C  benzonatate (TESSALON) 100 MG capsule Take 1 capsule (100 mg total) by mouth every 8 (eight) hours. Patient not taking: Reported on  03/25/2017 03/16/17   Anselm Pancoast, PA-C  benzonatate (TESSALON) 100 MG capsule Take 1 capsule (100 mg total) by mouth every 8 (eight) hours. 03/18/17   Tegeler, Canary Brim, MD  clotrimazole (LOTRIMIN) 1 % cream Apply to affected area 2 times daily 03/26/17   Emari Demmer, PA-C  ibuprofen (ADVIL,MOTRIN) 400 MG tablet Take 1 tablet (400 mg total) by mouth every 6 (six) hours as needed. Patient not taking: Reported on 03/25/2017 03/23/17   Couture, Cortni S, PA-C  lidocaine (XYLOCAINE) 2 % solution Use as directed 20 mLs in the mouth or throat as needed for mouth pain. Patient not taking: Reported on 03/25/2017 03/21/17   Demetrios Loll T, PA-C  omeprazole (PRILOSEC) 20 MG capsule Take 1 capsule (20 mg total) by mouth daily. Patient not taking: Reported on 03/25/2017 03/01/17   Horton, Mayer Masker, MD  predniSONE (STERAPRED UNI-PAK 21 TAB) 10 MG (21) TBPK tablet Take 6 tabs (60mg ) on day 1, 5 tabs (50mg ) on day 2, 4 tabs (40mg ) on day 3, 3 tabs (30mg ) on day 4, 2 tabs (20mg ) on day 5, and 1 tab (10mg ) on day 6. Patient not taking: Reported on 03/25/2017 03/16/17   Anselm Pancoast, PA-C    Family History No family history on file.  Social History Social History   Tobacco Use  . Smoking status: Current Every Day Smoker    Packs/day: 1.00    Years: 0.00  Pack years: 0.00    Types: Cigarettes, Cigars  . Smokeless tobacco: Never Used  Substance Use Topics  . Alcohol use: Yes  . Drug use: No    Comment: last use October 2017     Allergies   Haldol [haloperidol lactate]   Review of Systems Review of Systems  Constitutional: Negative for chills and fever.  Skin:       Itchy and irritated feet     Physical Exam Updated Vital Signs BP 134/89   Pulse 78   Temp 98.2 F (36.8 C) (Oral)   Resp 16   SpO2 97%   Physical Exam  Constitutional: He is oriented to person, place, and time. He appears well-developed and well-nourished. No distress.  HENT:  Head: Normocephalic and  atraumatic.  Eyes: EOM are normal.  Neck: Normal range of motion.  Pulmonary/Chest: Effort normal.  Abdominal: He exhibits no distension.  Musculoskeletal: Normal range of motion.  Neurological: He is alert and oriented to person, place, and time.  Skin: Skin is warm and dry. Capillary refill takes less than 2 seconds. No rash noted.  Tinea pedis noted on bilateral feet, worst between the toes, but noted along the soles of the feet.  No warmth or drainage.  No signs of superimposed infection.  Pedal pulses intact bilaterally.  Cap refill good bilaterally.  Psychiatric: He has a normal mood and affect.  Nursing note and vitals reviewed.    ED Treatments / Results  Labs (all labs ordered are listed, but only abnormal results are displayed) Labs Reviewed - No data to display  EKG  EKG Interpretation None       Radiology No results found.  Procedures Procedures (including critical care time)  Medications Ordered in ED Medications - No data to display   Initial Impression / Assessment and Plan / ED Course  I have reviewed the triage vital signs and the nursing notes.  Pertinent labs & imaging results that were available during my care of the patient were reviewed by me and considered in my medical decision making (see chart for details).     Pt presenting for evaluation of bilateral foot irritation. Physical exam reassuring, pt with signs of athletes foot. No sign of infection. Neurovascularly intact. Discussed findings with pt. Discussed use of lotrimin cream. F/u with PCP.  At this time, pt appears safe for discharge. Return precautions given. Pt states he understands and agrees to plan.   Final Clinical Impressions(s) / ED Diagnoses   Final diagnoses:  Tinea pedis of both feet    ED Discharge Orders        Ordered    clotrimazole (LOTRIMIN) 1 % cream     03/26/17 2356       Alveria ApleyCaccavale, Isao Seltzer, PA-C 03/27/17 0116    Maia PlanLong, Joshua G, MD 03/27/17 [phone]

## 2017-03-27 NOTE — ED Notes (Signed)
This RN attempted to discharge pt. Per EDP, pt had left after she reviewed instructions with him. Pt left prior to receiving AVS.

## 2017-03-27 NOTE — ED Notes (Signed)
Pt stable, ambulatory, states understanding of discharge instructions 

## 2017-03-27 NOTE — ED Notes (Signed)
Pt reports he was seen here yesterday and told to get powder for his feet but he does not have money to get powder. Pt is requesting powder.

## 2017-03-28 ENCOUNTER — Encounter (HOSPITAL_COMMUNITY): Payer: Self-pay | Admitting: Emergency Medicine

## 2017-03-28 ENCOUNTER — Emergency Department (HOSPITAL_COMMUNITY)
Admission: EM | Admit: 2017-03-28 | Discharge: 2017-03-28 | Disposition: A | Discharge status: Home / Self Care | Payer: Self-pay | Attending: Emergency Medicine | Admitting: Emergency Medicine

## 2017-03-28 DIAGNOSIS — G43009 Migraine without aura, not intractable, without status migrainosus: Secondary | ICD-10-CM | POA: Insufficient documentation

## 2017-03-28 DIAGNOSIS — M79673 Pain in unspecified foot: Secondary | ICD-10-CM | POA: Insufficient documentation

## 2017-03-28 DIAGNOSIS — F17210 Nicotine dependence, cigarettes, uncomplicated: Secondary | ICD-10-CM | POA: Insufficient documentation

## 2017-03-28 DIAGNOSIS — J45909 Unspecified asthma, uncomplicated: Secondary | ICD-10-CM | POA: Insufficient documentation

## 2017-03-28 DIAGNOSIS — Z5321 Procedure and treatment not carried out due to patient leaving prior to being seen by health care provider: Secondary | ICD-10-CM | POA: Insufficient documentation

## 2017-03-28 NOTE — ED Notes (Signed)
Patient spoke to this nurse, stating "I cant wait anymore. Ive got to go to work." Patient states "Ill be back tomorrow."

## 2017-03-28 NOTE — ED Triage Notes (Signed)
Patient c/o foot pain and itching for several days. [facility]. Reports was given tylenol which "helped a little bit".

## 2017-03-29 ENCOUNTER — Emergency Department (HOSPITAL_COMMUNITY)
Admission: EM | Admit: 2017-03-29 | Discharge: 2017-03-29 | Disposition: A | Discharge status: Home / Self Care | Payer: Self-pay | Attending: Emergency Medicine | Admitting: Emergency Medicine

## 2017-03-29 ENCOUNTER — Other Ambulatory Visit: Payer: Self-pay

## 2017-03-29 ENCOUNTER — Encounter (HOSPITAL_COMMUNITY): Payer: Self-pay

## 2017-03-29 ENCOUNTER — Emergency Department (HOSPITAL_COMMUNITY)
Admission: EM | Admit: 2017-03-29 | Discharge: 2017-03-30 | Disposition: A | Discharge status: Home / Self Care | Payer: Self-pay | Attending: Emergency Medicine | Admitting: Emergency Medicine

## 2017-03-29 DIAGNOSIS — R0789 Other chest pain: Secondary | ICD-10-CM | POA: Insufficient documentation

## 2017-03-29 DIAGNOSIS — Z79899 Other long term (current) drug therapy: Secondary | ICD-10-CM | POA: Insufficient documentation

## 2017-03-29 DIAGNOSIS — R519 Headache, unspecified: Secondary | ICD-10-CM

## 2017-03-29 DIAGNOSIS — F17210 Nicotine dependence, cigarettes, uncomplicated: Secondary | ICD-10-CM | POA: Insufficient documentation

## 2017-03-29 DIAGNOSIS — J45909 Unspecified asthma, uncomplicated: Secondary | ICD-10-CM | POA: Insufficient documentation

## 2017-03-29 DIAGNOSIS — G43009 Migraine without aura, not intractable, without status migrainosus: Secondary | ICD-10-CM

## 2017-03-29 DIAGNOSIS — R51 Headache: Secondary | ICD-10-CM | POA: Insufficient documentation

## 2017-03-29 MED ORDER — ACETAMINOPHEN 325 MG PO TABS
650.0000 mg | ORAL_TABLET | Freq: Once | ORAL | Status: AC
  Administered 2017-03-30: 650 mg via ORAL
  Filled 2017-03-29: qty 2

## 2017-03-29 MED ORDER — IBUPROFEN 800 MG PO TABS
800.0000 mg | ORAL_TABLET | Freq: Once | ORAL | Status: AC
  Administered 2017-03-29: 800 mg via ORAL
  Filled 2017-03-29: qty 1

## 2017-03-29 NOTE — Discharge Instructions (Signed)
You may alternate Tylenol 1000 mg every 6 hours as needed for pain and Ibuprofen 800 mg every 8 hours as needed for pain.  Please take Ibuprofen with food. ° °

## 2017-03-29 NOTE — ED Provider Notes (Signed)
TIME SEEN: 5:07 AM  CHIEF COMPLAINT: "I have a migraine"  HPI: Patient is a 42 year old male with history of substance abuse, homelessness, migraine is well-known to emergency with 79 visits in the past 6 months who presents today with diffuse throbbing all over headache.  Describes this is similar to his chronic headaches.  No head injury.  Not on blood thinners.  No numbness, tingling or focal weakness.  Able to ambulate.  No fever.  ROS: See HPI Constitutional: no fever  Eyes: no drainage  ENT: no runny nose   Cardiovascular:  no chest pain  Resp: no SOB  GI: no vomiting GU: no dysuria Integumentary: no rash  Allergy: no hives  Musculoskeletal: no leg swelling  Neurological: no slurred speech ROS otherwise negative  PAST MEDICAL HISTORY/PAST SURGICAL HISTORY:  Past Medical History:  Diagnosis Date  . Anxiety   . Asthma   . Cocaine abuse (HCC)   . Homelessness   . Migraine   . Tobacco abuse     MEDICATIONS:  Prior to Admission medications   Medication Sig Start Date End Date Taking? Authorizing Provider  acetaminophen (TYLENOL) 500 MG tablet Take 1,000 mg by mouth every 6 (six) hours as needed for moderate pain.    [provider]  albuterol (PROVENTIL HFA;VENTOLIN HFA) 108 (90 Base) MCG/ACT inhaler Inhale 2 puffs into the lungs every 4 (four) hours as needed for wheezing or shortness of breath. Patient not taking: Reported on 03/25/2017 03/16/17   Anselm Pancoast, PA-C  benzonatate (TESSALON) 100 MG capsule Take 1 capsule (100 mg total) by mouth every 8 (eight) hours. Patient not taking: Reported on 03/25/2017 03/16/17   Anselm Pancoast, PA-C  benzonatate (TESSALON) 100 MG capsule Take 1 capsule (100 mg total) by mouth every 8 (eight) hours. 03/18/17   Tegeler, Canary Brim, MD  clotrimazole (LOTRIMIN) 1 % cream Apply to affected area 2 times daily 03/26/17   Caccavale, Sophia, PA-C  ibuprofen (ADVIL,MOTRIN) 400 MG tablet Take 1 tablet (400 mg total) by mouth every 6 (six)  hours as needed. Patient not taking: Reported on 03/25/2017 03/23/17   Couture, Cortni S, PA-C  lidocaine (XYLOCAINE) 2 % solution Use as directed 20 mLs in the mouth or throat as needed for mouth pain. Patient not taking: Reported on 03/25/2017 03/21/17   Demetrios Loll T, PA-C  omeprazole (PRILOSEC) 20 MG capsule Take 1 capsule (20 mg total) by mouth daily. Patient not taking: Reported on 03/25/2017 03/01/17   Horton, Mayer Masker, MD  predniSONE (STERAPRED UNI-PAK 21 TAB) 10 MG (21) TBPK tablet Take 6 tabs (60mg ) on day 1, 5 tabs (50mg ) on day 2, 4 tabs (40mg ) on day 3, 3 tabs (30mg ) on day 4, 2 tabs (20mg ) on day 5, and 1 tab (10mg ) on day 6. Patient not taking: Reported on 03/25/2017 03/16/17   Anselm Pancoast, PA-C    ALLERGIES:  Allergies  Allergen Reactions  . Haldol [Haloperidol Lactate] Anaphylaxis    SOCIAL HISTORY:  Social History   Tobacco Use  . Smoking status: Current Every Day Smoker    Packs/day: 1.00    Years: 0.00    Pack years: 0.00    Types: Cigarettes, Cigars  . Smokeless tobacco: Never Used  Substance Use Topics  . Alcohol use: Yes    FAMILY HISTORY: History reviewed. No pertinent family history.  EXAM: BP 128/85 (BP Location: Left Arm)   Pulse 84   Temp 98.5 F (36.9 C) (Oral)   Resp 18  SpO2 93%  CONSTITUTIONAL: Alert and oriented and responds appropriately to questions. Well-appearing; well-nourished HEAD: Normocephalic, atraumatic EYES: Conjunctivae clear, pupils appear equal, EOMI ENT: normal nose; moist mucous membranes NECK: Supple, no meningismus, no nuchal rigidity, no LAD  CARD: RRR; S1 and S2 appreciated; no murmurs, no clicks, no rubs, no gallops RESP: Normal chest excursion without splinting or tachypnea; breath sounds clear and equal bilaterally; no wheezes, no rhonchi, no rales, no hypoxia or respiratory distress, speaking full sentences ABD/GI: Normal bowel sounds; non-distended; soft, non-tender, no rebound, no guarding, no peritoneal  signs, no hepatosplenomegaly BACK:  The back appears normal and is non-tender to palpation, there is no CVA tenderness EXT: Normal ROM in all joints; non-tender to palpation; no edema; normal capillary refill; no cyanosis, no calf tenderness or swelling    SKIN: Normal color for age and race; warm; no rash NEURO: Moves all extremities equally, normal gait, sensation to light touch intact diffusely, cranial nerves II through XII intact, normal speech  PSYCH: The patient's mood and manner are appropriate. Grooming and personal hygiene are appropriate.  MEDICAL DECISION MAKING: Patient here with complaints of migraine headache.  I have offered him IV, IM medications but he always declined stating that he is comfortable with oral Tylenol or Motrin.  Will give Motrin here in the ED.  Has history of the same chronic headaches.  No red flag symptoms.  Doubt intracranial hemorrhage, stroke, meningitis.  We will treat patient with ibuprofen and allow him to sleep as I feel this is often the main reason he is in the emergency department is for a place to rest given he is homeless.  He will be discharged in the morning.  At this time, I do not feel there is any life-threatening condition present. I have reviewed and discussed all results (EKG, imaging, lab, urine as appropriate) and exam findings with patient/family. I have reviewed nursing notes and appropriate previous records.  I feel the patient is safe to be discharged home without further emergent workup and can continue workup as an outpatient as needed. Discussed usual and customary return precautions. Patient/family verbalize understanding and are comfortable with this plan.  Outpatient follow-up has been provided if needed. All questions have been answered.      Ozelle Brubacher, Layla MawKristen N, DO 03/29/17 [phone]

## 2017-03-29 NOTE — ED Notes (Signed)
Called at 2240, no answer

## 2017-03-29 NOTE — ED Triage Notes (Signed)
Pt reports 10/10 migraine pain w/ sensitivity to light w/o neuro deficits. Pt A+OX4, speaking in complete sentences. Pt reports s/s started at 5pm.

## 2017-03-29 NOTE — ED Triage Notes (Signed)
Pt reports mid sternal cp on and off all day today. Pt endorses n/v. Denies SOB at this time, but endorses having sob earlier today.   160/90 Hr 90  spo2 99% RA cbg 121

## 2017-03-29 NOTE — ED Provider Notes (Signed)
[facility] EMERGENCY DEPARTMENT Provider Note   CSN: [id number] Arrival date & time: 03/29/17  7829     History   Chief Complaint Chief Complaint  Patient presents with  . Chest Pain    HPI Isaiah Cianci is a 42 y.o. male.  The history is provided by the patient and medical records.  Chest Pain      42 y.o. M with hx of anxiety, asthma, cocaine abuse, homelessness, presenting to the ED for chest pain.  Patient states he has been having intermittent chest pain all day, however stopped once he got to the waiting room here.  States now he does has a headache on the right side of his head.  He does have history of migraines.  States he has not been able to drink very much fluids today and has not eaten and he thinks this may be contributing.  He denies any dizziness, blurred vision, nausea, vomiting, photophobia, phonophobia, aura, confusion, numbness, weakness, difficulty walking.  States he is just very tired as he has not been able to get any rest.  Of note, patient well-known to this ED with 80 visits in the past 6 months.  Past Medical History:  Diagnosis Date  . Anxiety   . Asthma   . Cocaine abuse (HCC)   . Homelessness   . Migraine   . Tobacco abuse     Patient Active Problem List   Diagnosis Date Noted  . 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 Cocaine use disorder, moderate, dependence (HCC) 07/26/2016  . 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 Psychosis (HCC) 07/24/2016  . Chest pain 11/23/2015  . Asthma 11/23/2015  . Tobacco abuse 11/23/2015  . Elevated lactic acid level 11/23/2015  . Cocaine abuse (HCC)   . Concern about STD in male without diagnosis   . Paranoid ideation (HCC) 02/22/2015    History reviewed. No pertinent surgical history.     Home Medications    Prior to Admission medications   Medication Sig Start Date End Date Taking? Authorizing Provider    acetaminophen (TYLENOL) 500 MG tablet Take 1,000 mg by mouth every 6 (six) hours as needed for moderate pain.    [provider]  albuterol (PROVENTIL HFA;VENTOLIN HFA) 108 (90 Base) MCG/ACT inhaler Inhale 2 puffs into the lungs every 4 (four) hours as needed for wheezing or shortness of breath. Patient not taking: Reported on 03/25/2017 03/16/17   Anselm Pancoast, PA-C  benzonatate (TESSALON) 100 MG capsule Take 1 capsule (100 mg total) by mouth every 8 (eight) hours. Patient not taking: Reported on 03/25/2017 03/16/17   Anselm Pancoast, PA-C  benzonatate (TESSALON) 100 MG capsule Take 1 capsule (100 mg total) by mouth every 8 (eight) hours. 03/18/17   Tegeler, Canary Brim, MD  clotrimazole (LOTRIMIN) 1 % cream Apply to affected area 2 times daily 03/26/17   Caccavale, Sophia, PA-C  ibuprofen (ADVIL,MOTRIN) 400 MG tablet Take 1 tablet (400 mg total) by mouth every 6 (six) hours as needed. Patient not taking: Reported on 03/25/2017 03/23/17   Couture, Cortni S, PA-C  lidocaine (XYLOCAINE) 2 % solution Use as directed 20 mLs in the mouth or throat as needed for mouth pain. Patient not taking: Reported on 03/25/2017 03/21/17   Demetrios Loll T, PA-C  omeprazole (PRILOSEC) 20 MG capsule Take 1 capsule (20 mg total) by mouth daily. Patient not taking: Reported on 03/25/2017 03/01/17   Horton, Mayer Masker, MD  predniSONE (  STERAPRED UNI-PAK 21 TAB) 10 MG (21) TBPK tablet Take 6 tabs (60mg ) on day 1, 5 tabs (50mg ) on day 2, 4 tabs (40mg ) on day 3, 3 tabs (30mg ) on day 4, 2 tabs (20mg ) on day 5, and 1 tab (10mg ) on day 6. Patient not taking: Reported on 03/25/2017 03/16/17   Anselm PancoastJoy, Shawn C, PA-C    Family History No family history on file.  Social History Social History   Tobacco Use  . Smoking status: Current Every Day Smoker    Packs/day: 1.00    Years: 0.00    Pack years: 0.00    Types: Cigarettes, Cigars  . Smokeless tobacco: Never Used  Substance Use Topics  . Alcohol use: Yes  . Drug use: No     Comment: last use October 2017     Allergies   Haldol [haloperidol lactate]   Review of Systems Review of Systems  Cardiovascular: Positive for chest pain.  All other systems reviewed and are negative.    Physical Exam Updated Vital Signs BP 139/85 (BP Location: Right Arm)   Pulse 90   Temp 98.4 F (36.9 C) (Oral)   Resp 18   Ht 5\' 7"  (1.702 m)   Wt 72.6 kg (160 lb)   SpO2 100%   BMI 25.06 kg/m   Physical Exam  Constitutional: He is oriented to person, place, and time. He appears well-developed and well-nourished. No distress.  HENT:  Head: Normocephalic and atraumatic.  Right Ear: External ear normal.  Left Ear: External ear normal.  Mouth/Throat: Oropharynx is clear and moist.  Eyes: Conjunctivae and EOM are normal. Pupils are equal, round, and reactive to light.  Neck: Normal range of motion and full passive range of motion without pain. Neck supple. No neck rigidity.  No rigidity, no meningismus  Cardiovascular: Normal rate, regular rhythm and normal heart sounds.  No murmur heard. Pulmonary/Chest: Effort normal and breath sounds normal. No respiratory distress. He has no decreased breath sounds. He has no wheezes. He has no rhonchi. He has no rales.  Abdominal: Soft. Bowel sounds are normal. There is no tenderness. There is no guarding.  Musculoskeletal: Normal range of motion. He exhibits no edema.  Neurological: He is alert and oriented to person, place, and time. He has normal strength. He displays no tremor. No cranial nerve deficit or sensory deficit. He displays no seizure activity.  AAOx3, answering questions and following commands appropriately; equal strength UE and LE bilaterally; CN grossly intact; moves all extremities appropriately without ataxia; no focal neuro deficits or facial asymmetry appreciated  Skin: Skin is warm and dry. No rash noted. He is not diaphoretic.  Psychiatric: He has a normal mood and affect. His behavior is normal. Thought  content normal.  Nursing note and vitals reviewed.    ED Treatments / Results  Labs (all labs ordered are listed, but only abnormal results are displayed) Labs Reviewed - No data to display  EKG  EKG Interpretation  Date/Time:  Tuesday March 29 2017 18:34:32 EST Ventricular Rate:  88 PR Interval:  138 QRS Duration: 74 QT Interval:  366 QTC Calculation: 442 R Axis:   66 Text Interpretation:  Normal sinus rhythm Normal ECG No significant change was found Confirmed by Glynn Octaveancour, Stephen [phone]([id number]) on 03/29/2017 10:46:03 PM       Radiology No results found.  Procedures Procedures (including critical care time)  Medications Ordered in ED Medications - No data to display   Initial Impression / Assessment and Plan / ED  Course  I have reviewed the triage vital signs and the nursing notes.  Pertinent labs & imaging results that were available during my care of the patient were reviewed by me and considered in my medical decision making (see chart for details).  42 year old male here with chest pain.  By time of my evaluation he reports pain has resolved but now has a right-sided headache.  This is a common complaint for him.  He is awake, alert, appropriately oriented.  No focal neurologic deficits.  EKG normal sinus rhythm without acute ischemic changes.  Screening lab work was ordered but patient refused.  States he just feels very tired at this time.  Patient is currently homeless, this is his 80th ED visit in the past 6 months for similar related complaints.  He was given ginger ale and Tylenol here and allowed to rest for a short time.  I feel he can be safely discharged home.  Strongly encouraged to follow-up with PCP.  Discussed plan with patient, he acknowledged understanding and agreed with plan of care.  Return precautions given for new or worsening symptoms.  Final Clinical Impressions(s) / ED Diagnoses   Final diagnoses:  Atypical chest pain  Nonintractable headache,  unspecified chronicity pattern, unspecified headache type    ED Discharge Orders    None       [name], PA-C 03/30/17 0135    Glynn Octave, MD 03/30/17 [phone]

## 2017-03-29 NOTE — ED Notes (Signed)
Lab attempted to get patient's labs and patient refused. This nurse attempted also to get labs and stick patient and patient refused. Tried to educate patient for the need for labs (r/o MI - check cardiac enzymes); again he refused. States that he is having a migraine now and only needs "something for my headache, like a Tylenol". Patient was non-cooperative with assessment.

## 2017-03-30 ENCOUNTER — Other Ambulatory Visit: Payer: Self-pay

## 2017-03-30 ENCOUNTER — Emergency Department (HOSPITAL_COMMUNITY)
Admission: EM | Admit: 2017-03-30 | Discharge: 2017-03-31 | Disposition: A | Discharge status: Home / Self Care | Payer: Self-pay | Attending: Emergency Medicine | Admitting: Emergency Medicine

## 2017-03-30 ENCOUNTER — Encounter (HOSPITAL_COMMUNITY): Payer: Self-pay | Admitting: Emergency Medicine

## 2017-03-30 DIAGNOSIS — B353 Tinea pedis: Secondary | ICD-10-CM | POA: Insufficient documentation

## 2017-03-30 DIAGNOSIS — B351 Tinea unguium: Secondary | ICD-10-CM | POA: Insufficient documentation

## 2017-03-30 DIAGNOSIS — R51 Headache: Secondary | ICD-10-CM | POA: Insufficient documentation

## 2017-03-30 DIAGNOSIS — J45909 Unspecified asthma, uncomplicated: Secondary | ICD-10-CM | POA: Insufficient documentation

## 2017-03-30 DIAGNOSIS — F17210 Nicotine dependence, cigarettes, uncomplicated: Secondary | ICD-10-CM | POA: Insufficient documentation

## 2017-03-30 DIAGNOSIS — R519 Headache, unspecified: Secondary | ICD-10-CM

## 2017-03-30 MED ORDER — CLOTRIMAZOLE 1 % EX CREA
TOPICAL_CREAM | Freq: Two times a day (BID) | CUTANEOUS | Status: DC
  Administered 2017-03-30: 2 via TOPICAL
  Filled 2017-03-30: qty 15

## 2017-03-30 MED ORDER — ACETAMINOPHEN 325 MG PO TABS
650.0000 mg | ORAL_TABLET | Freq: Once | ORAL | Status: AC
  Administered 2017-03-30: 650 mg via ORAL
  Filled 2017-03-30: qty 2

## 2017-03-30 NOTE — Discharge Instructions (Signed)
Please follow up with your doctor and podiatrist

## 2017-03-30 NOTE — ED Triage Notes (Signed)
Patient here with migraine headache and bilateral foot pain.  Patient states he started a job today and has been on his feet all day long.

## 2017-03-30 NOTE — ED Provider Notes (Signed)
[facility] EMERGENCY DEPARTMENT Provider Note   CSN: [id number] Arrival date & time: 03/30/17  2048     History   Chief Complaint Chief Complaint  Patient presents with  . Migraine  . feet pain    HPI Dustin Norris is a 42 y.o. male who presents with complaints of a migraine headache and bilateral foot pain.  Past medical history significant for homelessness, frequent ED visits, frequent presentations for headaches and feet pain.  He states that he has a "real bad headache".  It is over bilateral temples.  Is the same as his chronic headaches.  He has not taken anything for the headache.  Additionally he is complaining of bilateral feet pain.  He states this is due to walking a lot and his untreated athlete's foot.  He states he does not know why we will not give him the foot powder. His feet are unchanged from prior evaluations he states that they are just not getting better.  He states he wears clean socks every day.  HPI  Past Medical History:  Diagnosis Date  . Anxiety   . Asthma   . Cocaine abuse (HCC)   . Homelessness   . Migraine   . Tobacco abuse     Patient Active Problem List   Diagnosis Date Noted  . 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 Cocaine use disorder, moderate, dependence (HCC) 07/26/2016  . 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 Psychosis (HCC) 07/24/2016  . Chest pain 11/23/2015  . Asthma 11/23/2015  . Tobacco abuse 11/23/2015  . Elevated lactic acid level 11/23/2015  . Cocaine abuse (HCC)   . Concern about STD in male without diagnosis   . Paranoid ideation (HCC) 02/22/2015    History reviewed. No pertinent surgical history.     Home Medications    Prior to Admission medications   Medication Sig Start Date End Date Taking? Authorizing Provider  albuterol (PROVENTIL HFA;VENTOLIN HFA) 108 (90 Base) MCG/ACT inhaler Inhale 2 puffs into the lungs every 4  (four) hours as needed for wheezing or shortness of breath. Patient not taking: Reported on 03/25/2017 03/16/17   Anselm Pancoast, PA-C  benzonatate (TESSALON) 100 MG capsule Take 1 capsule (100 mg total) by mouth every 8 (eight) hours. Patient not taking: Reported on 03/25/2017 03/16/17   Anselm Pancoast, PA-C  benzonatate (TESSALON) 100 MG capsule Take 1 capsule (100 mg total) by mouth every 8 (eight) hours. Patient not taking: Reported on 03/29/2017 03/18/17   Tegeler, Canary Brim, MD  clotrimazole (LOTRIMIN) 1 % cream Apply to affected area 2 times daily Patient not taking: Reported on 03/29/2017 03/26/17   Caccavale, Sophia, PA-C  ibuprofen (ADVIL,MOTRIN) 400 MG tablet Take 1 tablet (400 mg total) by mouth every 6 (six) hours as needed. Patient not taking: Reported on 03/25/2017 03/23/17   Couture, Cortni S, PA-C  lidocaine (XYLOCAINE) 2 % solution Use as directed 20 mLs in the mouth or throat as needed for mouth pain. Patient not taking: Reported on 03/25/2017 03/21/17   Demetrios Loll T, PA-C  omeprazole (PRILOSEC) 20 MG capsule Take 1 capsule (20 mg total) by mouth daily. Patient not taking: Reported on 03/25/2017 03/01/17   Horton, Mayer Masker, MD  predniSONE (STERAPRED UNI-PAK 21 TAB) 10 MG (21) TBPK tablet Take 6 tabs (60mg ) on day 1, 5 tabs (50mg ) on day 2, 4 tabs (40mg ) on day 3, 3 tabs (30mg ) on day 4, 2 tabs (20mg ) on  day 5, and 1 tab (10mg ) on day 6. Patient not taking: Reported on 03/25/2017 03/16/17   Anselm PancoastJoy, Shawn C, PA-C    Family History No family history on file.  Social History Social History   Tobacco Use  . Smoking status: Current Every Day Smoker    Packs/day: 1.00    Years: 0.00    Pack years: 0.00    Types: Cigarettes, Cigars  . Smokeless tobacco: Never Used  Substance Use Topics  . Alcohol use: Yes  . Drug use: No    Comment: last use October 2017     Allergies   Haldol [haloperidol lactate]   Review of Systems Review of Systems  Skin:       +athletes foot    Neurological: Positive for headaches.     Physical Exam Updated Vital Signs BP 115/71   Pulse 80   Temp 98.5 F (36.9 C) (Oral)   Resp 18   SpO2 99%   Physical Exam  Constitutional: He is oriented to person, place, and time. He appears well-developed and well-nourished. No distress.  HENT:  Head: Normocephalic and atraumatic.  Eyes: Conjunctivae are normal. Pupils are equal, round, and reactive to light. Right eye exhibits no discharge. Left eye exhibits no discharge. No scleral icterus.  Neck: Normal range of motion.  Cardiovascular: Normal rate.  Pulmonary/Chest: Effort normal. No respiratory distress.  Abdominal: He exhibits no distension.  Musculoskeletal:  Bilateral feet: Skin changes consistent with athletes foot and onychomycosis  Neurological: He is alert and oriented to person, place, and time.  Lying on stretcher in NAD. GCS 15. Speaks in a clear voice. Cranial nerves II through XII grossly intact. 5/5 strength in all extremities. Sensation fully intact.  Bilateral finger-to-nose intact. Ambulatory    Skin: Skin is warm and dry.  Psychiatric: He has a normal mood and affect. His behavior is normal.  Nursing note and vitals reviewed.    ED Treatments / Results  Labs (all labs ordered are listed, but only abnormal results are displayed) Labs Reviewed - No data to display  EKG  EKG Interpretation None       Radiology No results found.  Procedures Procedures (including critical care time)  Medications Ordered in ED Medications  clotrimazole (LOTRIMIN) 1 % cream (2 application Topical Given 03/30/17 2332)  acetaminophen (TYLENOL) tablet 650 mg (650 mg Oral Given 03/30/17 2253)     Initial Impression / Assessment and Plan / ED Course  I have reviewed the triage vital signs and the nursing notes.  Pertinent labs & imaging results that were available during my care of the patient were reviewed by me and considered in my medical decision making (see chart  for details).  42 year old male presents with a headache and athlete's foot. His complaints are chronic and unchanged. Vitals are normal. Exam is grossly normal. He was given Tylenol, food, and Lotrimin cream. He was discharged and advised to f/u with Podiatry.  Final Clinical Impressions(s) / ED Diagnoses   Final diagnoses:  Bad headache  Tinea pedis of both feet  Onychomycosis    ED Discharge Orders    None       Bethel BornGekas, Shon Indelicato Marie, PA-C 03/31/17 [phone]    Pricilla LovelessGoldston, Scott, MD 03/31/17 [phone]

## 2017-03-30 NOTE — ED Notes (Signed)
No e-sign pad available; patient verbalized understand

## 2017-03-31 ENCOUNTER — Encounter (HOSPITAL_COMMUNITY): Payer: Self-pay | Admitting: *Deleted

## 2017-03-31 DIAGNOSIS — F17210 Nicotine dependence, cigarettes, uncomplicated: Secondary | ICD-10-CM | POA: Insufficient documentation

## 2017-03-31 DIAGNOSIS — Z590 Homelessness: Secondary | ICD-10-CM | POA: Insufficient documentation

## 2017-03-31 DIAGNOSIS — R51 Headache: Secondary | ICD-10-CM | POA: Insufficient documentation

## 2017-03-31 NOTE — ED Notes (Signed)
Pt departed in NAD, refused use of wheelchair.  

## 2017-03-31 NOTE — ED Triage Notes (Signed)
Pt complains of migraine for the past 2 days.

## 2017-04-01 ENCOUNTER — Other Ambulatory Visit: Payer: Self-pay

## 2017-04-01 ENCOUNTER — Emergency Department (HOSPITAL_COMMUNITY)
Admission: EM | Admit: 2017-04-01 | Discharge: 2017-04-01 | Disposition: A | Discharge status: Home / Self Care | Payer: Self-pay | Attending: Emergency Medicine | Admitting: Emergency Medicine

## 2017-04-01 ENCOUNTER — Encounter (HOSPITAL_COMMUNITY): Payer: Self-pay

## 2017-04-01 DIAGNOSIS — R519 Headache, unspecified: Secondary | ICD-10-CM

## 2017-04-01 DIAGNOSIS — F17210 Nicotine dependence, cigarettes, uncomplicated: Secondary | ICD-10-CM | POA: Insufficient documentation

## 2017-04-01 DIAGNOSIS — R51 Headache: Secondary | ICD-10-CM | POA: Insufficient documentation

## 2017-04-01 DIAGNOSIS — Z590 Homelessness: Secondary | ICD-10-CM | POA: Insufficient documentation

## 2017-04-01 MED ORDER — IBUPROFEN 800 MG PO TABS
800.0000 mg | ORAL_TABLET | Freq: Once | ORAL | Status: AC
  Administered 2017-04-01: 800 mg via ORAL
  Filled 2017-04-01: qty 1

## 2017-04-01 MED ORDER — KETOROLAC TROMETHAMINE 30 MG/ML IJ SOLN
30.0000 mg | Freq: Once | INTRAMUSCULAR | Status: DC
Start: 2017-04-01 — End: 2017-04-01

## 2017-04-01 MED ORDER — IBUPROFEN 200 MG PO TABS
400.0000 mg | ORAL_TABLET | Freq: Once | ORAL | Status: AC
  Administered 2017-04-01: 400 mg via ORAL
  Filled 2017-04-01: qty 2

## 2017-04-01 MED ORDER — ONDANSETRON 8 MG PO TBDP
8.0000 mg | ORAL_TABLET | Freq: Once | ORAL | Status: AC
  Administered 2017-04-01: 8 mg via ORAL
  Filled 2017-04-01: qty 1

## 2017-04-01 MED ORDER — BUTALBITAL-APAP-CAFFEINE 50-325-40 MG PO TABS
1.0000 | ORAL_TABLET | Freq: Once | ORAL | Status: AC
  Administered 2017-04-01: 1 via ORAL
  Filled 2017-04-01: qty 1

## 2017-04-01 NOTE — ED Notes (Signed)
Pt called x 3 with no answer to be taken to treatment room

## 2017-04-01 NOTE — ED Notes (Signed)
Called patient twice and he did not answer.

## 2017-04-01 NOTE — ED Notes (Signed)
Patient called for triage x 1 no answer.

## 2017-04-01 NOTE — ED Provider Notes (Signed)
[facility]-EMERGENCY DEPT Provider Note   CSN: [id number] Arrival date & time: 03/31/17  1902     History   Chief Complaint Chief Complaint  Patient presents with  . Migraine    HPI Nichola Warren is a 42 y.o. male.  HPI   41yM with headache. I'm just here for something for pain and some rest. Says HA began yesterday. Has them frequently. Denies trauma. No fever or chills. Frequent utilizer of the ED.   Past Medical History:  Diagnosis Date  . Anxiety   . Asthma   . Cocaine abuse (HCC)   . Homelessness   . Migraine   . Tobacco abuse     Patient Active Problem List   Diagnosis Date Noted  . 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 Cocaine use disorder, moderate, dependence (HCC) 07/26/2016  . 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 Psychosis (HCC) 07/24/2016  . Chest pain 11/23/2015  . Asthma 11/23/2015  . Tobacco abuse 11/23/2015  . Elevated lactic acid level 11/23/2015  . Cocaine abuse (HCC)   . Concern about STD in male without diagnosis   . Paranoid ideation (HCC) 02/22/2015    History reviewed. No pertinent surgical history.     Home Medications    Prior to Admission medications   Not on File    Family History No family history on file.  Social History Social History   Tobacco Use  . Smoking status: Current Every Day Smoker    Packs/day: 1.00    Years: 0.00    Pack years: 0.00    Types: Cigarettes, Cigars  . Smokeless tobacco: Never Used  Substance Use Topics  . Alcohol use: Yes  . Drug use: No    Comment: last use October 2017     Allergies   Haldol [haloperidol lactate]   Review of Systems Review of Systems  All systems reviewed and negative, other than as noted in HPI.  Physical Exam Updated Vital Signs BP (!) 148/84 (BP Location: Right Arm)   Pulse 98   Temp 98.8 F (37.1 C) (Oral)   Resp 18   SpO2 98%   Physical Exam    Constitutional: He is oriented to person, place, and time. He appears well-developed and well-nourished. No distress.  Laying in bed. NAD.   HENT:  Head: Normocephalic and atraumatic.  Eyes: Conjunctivae are normal. Right eye exhibits no discharge. Left eye exhibits no discharge.  Neck: Neck supple.  No nuchal rigidity   Cardiovascular: Normal rate, regular rhythm and normal heart sounds. Exam reveals no gallop and no friction rub.  No murmur heard. Pulmonary/Chest: Effort normal and breath sounds normal. No respiratory distress.  Abdominal: Soft. He exhibits no distension. There is no tenderness.  Musculoskeletal: He exhibits no edema or tenderness.  Neurological: He is alert and oriented to person, place, and time. No cranial nerve deficit or sensory deficit. He exhibits normal muscle tone. Coordination normal.  Skin: Skin is warm and dry.  Psychiatric: He has a normal mood and affect. His behavior is normal. Thought content normal.  Nursing note and vitals reviewed.     ED Treatments / Results  Labs (all labs ordered are listed, but only abnormal results are displayed) Labs Reviewed - No data to display  EKG  EKG Interpretation None       Radiology No results found.  Procedures Procedures (including critical care time)  Medications Ordered in ED Medications  butalbital-acetaminophen-caffeine (FIORICET, ESGIC) [phone]  MG per tablet 1 tablet (not administered)  ibuprofen (ADVIL,MOTRIN) tablet 400 mg (not administered)     Initial Impression / Assessment and Plan / ED Course  I have reviewed the triage vital signs and the nursing notes.  Pertinent labs & imaging results that were available during my care of the patient were reviewed by me and considered in my medical decision making (see chart for details).     41yM with headache. Frequent ED utilization. Often c/o headache. Denies acute change in character. Nonfocal neuro exam. Afebrile. No nuchal rigidity. I  doubt emergent process.   Final Clinical Impressions(s) / ED Diagnoses   Final diagnoses:  Nonintractable headache, unspecified chronicity pattern, unspecified headache type    ED Discharge Orders    None       Raeford RazorKohut, Omari Mcmanaway, MD 04/01/17 [phone]

## 2017-04-01 NOTE — ED Triage Notes (Signed)
Charting at 1739 and 1749 by writer was charted on the wrong patient.

## 2017-04-01 NOTE — ED Triage Notes (Signed)
Pt reports headache. Denies vomiting or vision changes. A&Ox4. Ambulatory.

## 2017-04-01 NOTE — ED Provider Notes (Signed)
[facility]-EMERGENCY DEPT Provider Note   CSN: [id number] Arrival date & time: 04/01/17  1518     History   Chief Complaint Chief Complaint  Patient presents with  . Headache    HPI Dustin Norris is a 42 y.o. male.  HPI Patient presents to the emergency room for evaluation of headache.  Patient has a history of frequent headaches.  He frequently comes to the emergency room for Norris symptoms.  This is the second visit for the patient today.  This is the patient's seventh visit to the emergency room in approximately past week.  Patient states he has recurrent headaches.  This feels typical for his previous headaches.  He denies any fever.  No nausea vomiting.  Patient states he is homeless so he does not have access to medicines.  When his headache persisted he came back to the emergency room. Past Medical History:  Diagnosis Date  . Anxiety   . Asthma   . Cocaine abuse (HCC)   . Homelessness   . Migraine   . Tobacco abuse     Patient Active Problem List   Diagnosis Date Noted  . 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 Cocaine use disorder, moderate, dependence (HCC) 07/26/2016  . 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 Psychosis (HCC) 07/24/2016  . Chest pain 11/23/2015  . Asthma 11/23/2015  . Tobacco abuse 11/23/2015  . Elevated lactic acid level 11/23/2015  . Cocaine abuse (HCC)   . Concern about STD in male without diagnosis   . Paranoid ideation (HCC) 02/22/2015    History reviewed. No pertinent surgical history.     Home Medications    Prior to Admission medications   Not on File    Family History History reviewed. No pertinent family history.  Social History Social History   Tobacco Use  . Smoking status: Current Every Day Smoker    Packs/day: 1.00    Years: 0.00    Pack years: 0.00    Types: Cigarettes, Cigars  . Smokeless tobacco: Never Used  Substance  Use Topics  . Alcohol use: Yes  . Drug use: No    Comment: last use October 2017     Allergies   Haldol [haloperidol lactate]   Review of Systems Review of Systems  All other systems reviewed and are negative.    Physical Exam Updated Vital Signs BP (!) 154/108 (BP Location: Right Arm)   Pulse 78   Temp 99.1 F (37.3 C) (Oral)   Resp 16   Ht 1.702 m (5\' 7" )   Wt 72.6 kg (160 lb)   SpO2 100%   BMI 25.06 kg/m   Physical Exam  Constitutional: He appears well-developed and well-nourished. No distress.  Patient appears comfortable, he is eating potato chips  HENT:  Head: Normocephalic and atraumatic.  Right Ear: External ear normal.  Left Ear: External ear normal.  Eyes: Conjunctivae are normal. Right eye exhibits no discharge. Left eye exhibits no discharge. No scleral icterus.  Neck: Neck supple. No tracheal deviation present.  Cardiovascular: Normal rate, regular rhythm and intact distal pulses.  Pulmonary/Chest: Effort normal and breath sounds normal. No stridor. No respiratory distress. He has no wheezes. He has no rales.  Abdominal: Soft. Bowel sounds are normal. He exhibits no distension. There is no tenderness. There is no rebound and no guarding.  Musculoskeletal: He exhibits no edema or tenderness.  Neurological: He is alert. He has normal strength. No cranial nerve  deficit (no facial droop, extraocular movements intact, no slurred speech) or sensory deficit. He exhibits normal muscle tone. He displays no seizure activity. Coordination normal.  Skin: Skin is warm and dry. No rash noted.  Psychiatric: He has a normal mood and affect.  Nursing note and vitals reviewed.    ED Treatments / Results   Procedures Procedures (including critical care time)  Medications Ordered in ED Medications  ondansetron (ZOFRAN-ODT) disintegrating tablet 8 mg (not administered)  ibuprofen (ADVIL,MOTRIN) tablet 800 mg (not administered)     Initial Impression / Assessment  and Plan / ED Course  I have reviewed the triage vital signs and the nursing notes.  Pertinent labs & imaging results that were available during my care of the patient were reviewed by me and considered in my medical decision making (see chart for details).   Patient has a history of frequent headaches.  He is a frequent utilizer in the emergency room.  Patient does not appear to have any signs of emergent medical condition associated with his headache.  I doubt meningitis, subarachnoid hemorrhage or other emergent etiology.  Patient asked for oral medications.  He is stable for discharge.  Final Clinical Impressions(s) / ED Diagnoses   Final diagnoses:  Headache disorder    ED Discharge Orders    None       Linwood DibblesKnapp, Danetta Prom, MD 04/01/17 2310

## 2017-04-01 NOTE — ED Notes (Signed)
Pt called for vs, no response, pt seen leaving the lobby

## 2017-04-01 NOTE — Discharge Instructions (Signed)
Follow-up with your primary care doctor, continue your current medications °

## 2017-04-03 ENCOUNTER — Emergency Department (HOSPITAL_COMMUNITY)
Admission: EM | Admit: 2017-04-03 | Discharge: 2017-04-03 | Disposition: A | Discharge status: Home / Self Care | Payer: Self-pay | Attending: Emergency Medicine | Admitting: Emergency Medicine

## 2017-04-03 ENCOUNTER — Other Ambulatory Visit: Payer: Self-pay

## 2017-04-03 ENCOUNTER — Encounter (HOSPITAL_COMMUNITY): Payer: Self-pay

## 2017-04-03 DIAGNOSIS — F17210 Nicotine dependence, cigarettes, uncomplicated: Secondary | ICD-10-CM | POA: Insufficient documentation

## 2017-04-03 DIAGNOSIS — Z590 Homelessness: Secondary | ICD-10-CM | POA: Insufficient documentation

## 2017-04-03 DIAGNOSIS — J45909 Unspecified asthma, uncomplicated: Secondary | ICD-10-CM | POA: Insufficient documentation

## 2017-04-03 DIAGNOSIS — G44229 Chronic tension-type headache, not intractable: Secondary | ICD-10-CM | POA: Insufficient documentation

## 2017-04-03 MED ORDER — ONDANSETRON 8 MG PO TBDP
8.0000 mg | ORAL_TABLET | Freq: Once | ORAL | Status: AC
  Administered 2017-04-03: 8 mg via ORAL
  Filled 2017-04-03: qty 1

## 2017-04-03 MED ORDER — IBUPROFEN 200 MG PO TABS
600.0000 mg | ORAL_TABLET | Freq: Once | ORAL | Status: AC
  Administered 2017-04-03: 600 mg via ORAL
  Filled 2017-04-03: qty 3

## 2017-04-03 NOTE — ED Triage Notes (Signed)
Per EMS, pt complains of lightheadedness, dizziness because he has not eaten today and was working lifting heavy objects.  CBG 182 Lung sounds clear BP 140 palpated

## 2017-04-03 NOTE — ED Provider Notes (Signed)
[facility]-EMERGENCY DEPT Provider Note   CSN: [id number] Arrival date & time: 04/03/17  1352     History   Chief Complaint Chief Complaint  Patient presents with  . Dizziness  . Hungry    HPI Dustin Norris is a 42 y.o. male.  HPI 42 year old male well-known to this ED with history of recurrent ED visits, polysubstance abuse, here with multiple complaints.  The patient states that here because he would like to stay for the night.  He is currently homeless and states that it is cold outside.  He also notes that he has a mild, generalized headache for which she has been seen multiple times.  No neck pain or neck stiffness.  He is a well-documented history of chronic headaches and admits that today's headache is no different than usual.  He also reports that he has some mild left medial knee pain.  He worked today and had to move furniture.  He states that he feels like he overused it.  He states he has had little to eat today.  Denies any fevers or chills.  No abdominal pain nausea or vomiting.  He is requesting something to eat as well as somewhat to stay tonight.  Of note, patient has 84 visits in the last 6 months.  Past Medical History:  Diagnosis Date  . Anxiety   . Asthma   . Cocaine abuse (HCC)   . Homelessness   . Migraine   . Tobacco abuse     Patient Active Problem List   Diagnosis Date Noted  . 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 Cocaine use disorder, moderate, dependence (HCC) 07/26/2016  . 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 Psychosis (HCC) 07/24/2016  . Chest pain 11/23/2015  . Asthma 11/23/2015  . Tobacco abuse 11/23/2015  . Elevated lactic acid level 11/23/2015  . Cocaine abuse (HCC)   . Concern about STD in male without diagnosis   . Paranoid ideation (HCC) 02/22/2015    History reviewed. No pertinent surgical history.     Home Medications    Prior to  Admission medications   Not on File    Family History History reviewed. No pertinent family history.  Social History Social History   Tobacco Use  . Smoking status: Current Every Day Smoker    Packs/day: 1.00    Years: 0.00    Pack years: 0.00    Types: Cigarettes, Cigars  . Smokeless tobacco: Never Used  Substance Use Topics  . Alcohol use: Yes  . Drug use: No    Comment: last use October 2017     Allergies   Haldol [haloperidol lactate]   Review of Systems Review of Systems  Constitutional: Positive for fatigue. Negative for chills and fever.  HENT: Negative for ear pain and sore throat.   Eyes: Negative for pain and visual disturbance.  Respiratory: Negative for cough and shortness of breath.   Cardiovascular: Negative for chest pain and palpitations.  Gastrointestinal: Negative for abdominal pain and vomiting.  Genitourinary: Negative for dysuria and hematuria.  Musculoskeletal: Negative for arthralgias and back pain.  Skin: Negative for color change and rash.  Neurological: Positive for headaches. Negative for seizures and syncope.  All other systems reviewed and are negative.    Physical Exam Updated Vital Signs BP (!) 152/97 (BP Location: Left Arm)   Pulse 78   Temp 97.8 F (36.6 C) (Oral)   Resp 14   SpO2 99%  Physical Exam  Constitutional: He is oriented to person, place, and time. He appears well-developed and well-nourished. No distress.  HENT:  Head: Normocephalic and atraumatic.  Eyes: Conjunctivae are normal.  Neck: Neck supple.  Cardiovascular: Normal rate, regular rhythm and normal heart sounds. Exam reveals no friction rub.  No murmur heard. Pulmonary/Chest: Effort normal and breath sounds normal. No respiratory distress. He has no wheezes. He has no rales.  Abdominal: He exhibits no distension.  Musculoskeletal: He exhibits no edema.  Mild tenderness to palpation over the medial aspect of the right knee.  No ligamentous laxity.  No  bony tenderness.  No joint swelling or effusions.  Distal neuro vasculature is intact.  Neurological: He is alert and oriented to person, place, and time. He has normal strength. No cranial nerve deficit or sensory deficit. He exhibits normal muscle tone. Gait normal. GCS eye subscore is 4. GCS verbal subscore is 5. GCS motor subscore is 6.  Skin: Skin is warm. Capillary refill takes less than 2 seconds.  Psychiatric: He has a normal mood and affect.  Nursing note and vitals reviewed.    ED Treatments / Results  Labs (all labs ordered are listed, but only abnormal results are displayed) Labs Reviewed - No data to display  EKG  EKG Interpretation None       Radiology No results found.  Procedures Procedures (including critical care time)  Medications Ordered in ED Medications  ibuprofen (ADVIL,MOTRIN) tablet 600 mg (not administered)  ondansetron (ZOFRAN-ODT) disintegrating tablet 8 mg (not administered)     Initial Impression / Assessment and Plan / ED Course  I have reviewed the triage vital signs and the nursing notes.  Pertinent labs & imaging results that were available during my care of the patient were reviewed by me and considered in my medical decision making (see chart for details).     [age] year old male well-known to this ED here with acute on chronic headache and request for summer to stay tonight because it is cold.  Regarding his headache, this is well known and well-documented.  He has a history of chronic headaches.  He denies any focal numbness, weakness, or other complaints.  Patient given Motrin.  No red flags.  No signs of meningitis or encephalitis.  Regarding his request today, I have given him a [place] sandwich and will have him go to the waiting room if he would like to stay out of the cold.  I discussed that we could not keep him in the ED overnight due to absence of any acute emergent pathology.  Patient understanding and agreement.  Final Clinical  Impressions(s) / ED Diagnoses   Final diagnoses:  Chronic tension-type headache, not intractable    ED Discharge Orders    None       Shaune PollackIsaacs, Kincade Granberg, MD 04/03/17 [id number]

## 2017-04-04 DIAGNOSIS — R51 Headache: Secondary | ICD-10-CM | POA: Insufficient documentation

## 2017-04-04 DIAGNOSIS — Z5321 Procedure and treatment not carried out due to patient leaving prior to being seen by health care provider: Secondary | ICD-10-CM | POA: Insufficient documentation

## 2017-04-05 ENCOUNTER — Encounter (HOSPITAL_COMMUNITY): Payer: Self-pay | Admitting: Emergency Medicine

## 2017-04-05 ENCOUNTER — Emergency Department (HOSPITAL_COMMUNITY)
Admission: EM | Admit: 2017-04-05 | Discharge: 2017-04-05 | Disposition: A | Discharge status: Home / Self Care | Payer: Self-pay | Attending: Emergency Medicine | Admitting: Emergency Medicine

## 2017-04-05 ENCOUNTER — Other Ambulatory Visit: Payer: Self-pay

## 2017-04-05 NOTE — ED Notes (Signed)
Patient sleep, not answering for vitals.

## 2017-04-05 NOTE — ED Notes (Signed)
Bed: WTR6 Expected date:  Expected time:  Means of arrival:  Comments: 

## 2017-04-05 NOTE — ED Notes (Signed)
Pt reports that he wants to leave because he has to be at work.

## 2017-04-05 NOTE — ED Triage Notes (Signed)
Pt reports headache that started earlier yesterday.

## 2017-04-06 ENCOUNTER — Encounter (HOSPITAL_COMMUNITY): Payer: Self-pay | Admitting: Emergency Medicine

## 2017-04-06 ENCOUNTER — Emergency Department (HOSPITAL_COMMUNITY)
Admission: EM | Admit: 2017-04-06 | Discharge: 2017-04-07 | Disposition: A | Discharge status: Home / Self Care | Payer: Self-pay | Attending: Emergency Medicine | Admitting: Emergency Medicine

## 2017-04-06 ENCOUNTER — Other Ambulatory Visit: Payer: Self-pay

## 2017-04-06 DIAGNOSIS — J029 Acute pharyngitis, unspecified: Secondary | ICD-10-CM | POA: Insufficient documentation

## 2017-04-06 DIAGNOSIS — R51 Headache: Secondary | ICD-10-CM | POA: Insufficient documentation

## 2017-04-06 DIAGNOSIS — R519 Headache, unspecified: Secondary | ICD-10-CM

## 2017-04-06 DIAGNOSIS — F17210 Nicotine dependence, cigarettes, uncomplicated: Secondary | ICD-10-CM | POA: Insufficient documentation

## 2017-04-06 DIAGNOSIS — J45909 Unspecified asthma, uncomplicated: Secondary | ICD-10-CM | POA: Insufficient documentation

## 2017-04-06 MED ORDER — ACETAMINOPHEN 325 MG PO TABS
650.0000 mg | ORAL_TABLET | Freq: Once | ORAL | Status: AC
  Administered 2017-04-06: 650 mg via ORAL
  Filled 2017-04-06: qty 2

## 2017-04-06 NOTE — Discharge Instructions (Signed)
Please follow with your primary care doctor in the next 2 days for a check-up. They must obtain records for further management.  ° ° ° °

## 2017-04-06 NOTE — ED Triage Notes (Signed)
Pt c/o sore throat for the past few weeks no fever or chills.

## 2017-04-06 NOTE — ED Provider Notes (Signed)
[facility] EMERGENCY DEPARTMENT Provider Note   CSN: [id number] Arrival date & time: 04/06/17  2326     History   Chief Complaint Chief Complaint  Patient presents with  . Sore Throat    HPI   Blood pressure (!) 152/93, pulse 78, temperature 98.4 F (36.9 C), temperature source Oral, resp. rate 18, SpO2 100 %.  Dustin Norris is a 42 y.o. male complaining of headache, sore throat over the course of the last several weeks.Pt denies fever, rash, confusion, cervicalgia, LOC/syncope, change in vision, N/V, numbness, weakness, dysarthria, ataxia, thunderclap onset, exacerbation with exertion or valsalva, exacerbation in morning, CP, SOB, abdominal pain.  Past Medical History:  Diagnosis Date  . Anxiety   . Asthma   . Cocaine abuse (HCC)   . Homelessness   . Migraine   . Tobacco abuse     Patient Active Problem List   Diagnosis Date Noted  . 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 Cocaine use disorder, moderate, dependence (HCC) 07/26/2016  . 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 Psychosis (HCC) 07/24/2016  . Chest pain 11/23/2015  . Asthma 11/23/2015  . Tobacco abuse 11/23/2015  . Elevated lactic acid level 11/23/2015  . Cocaine abuse (HCC)   . Concern about STD in male without diagnosis   . Paranoid ideation (HCC) 02/22/2015    History reviewed. No pertinent surgical history.     Home Medications    Prior to Admission medications   Not on File    Family History No family history on file.  Social History Social History   Tobacco Use  . Smoking status: Current Every Day Smoker    Packs/day: 1.00    Years: 0.00    Pack years: 0.00    Types: Cigarettes, Cigars  . Smokeless tobacco: Never Used  Substance Use Topics  . Alcohol use: Yes  . Drug use: No    Comment: last use October 2017     Allergies   Haldol [haloperidol lactate]   Review of Systems Review of  Systems  A complete review of systems was obtained and all systems are negative except as noted in the HPI and PMH.    Physical Exam Updated Vital Signs BP (!) 152/93 (BP Location: Right Arm)   Pulse 78   Temp 98.4 F (36.9 C) (Oral)   Resp 18   SpO2 100%   Physical Exam  Constitutional: He is oriented to person, place, and time. He appears well-developed and well-nourished.  HENT:  Head: Normocephalic and atraumatic.  Right Ear: External ear normal.  Left Ear: External ear normal.  Mouth/Throat: Oropharynx is clear and moist. No oropharyngeal exudate.  No drooling or stridor. Posterior pharynx mildly erythematous no significant tonsillar hypertrophy. No exudate. Soft palate rises symmetrically. No TTP or induration under tongue.   No tenderness to palpation of frontal or bilateral maxillary sinuses.  Mild mucosal edema in the nares with scant rhinorrhea.  Bilateral tympanic membranes with normal architecture and good light reflex.    Eyes: Conjunctivae and EOM are normal. Pupils are equal, round, and reactive to light.  No TTP of maxillary or frontal sinuses  No TTP or induration of temporal arteries bilaterally  Neck: Normal range of motion. Neck supple.  FROM to C-spine. Pt can touch chin to chest without discomfort. No TTP of midline cervical spine.   Cardiovascular: Normal rate, regular rhythm and intact distal pulses.  Pulmonary/Chest: Effort normal and breath sounds normal. No  stridor. No respiratory distress. He has no wheezes. He has no rales. He exhibits no tenderness.  Abdominal: Soft. Bowel sounds are normal. There is no tenderness. There is no rebound and no guarding.  Musculoskeletal: Normal range of motion. He exhibits no edema or tenderness.  Neurological: He is alert and oriented to person, place, and time. No cranial nerve deficit.  II-Visual fields grossly intact. III/IV/VI-Extraocular movements intact.  Pupils reactive bilaterally. V/VII-Smile  symmetric, equal eyebrow raise,  facial sensation intact VIII- Hearing grossly intact IX/X-Normal gag XI-bilateral shoulder shrug XII-midline tongue extension Motor: 5/5 bilaterally with normal tone and bulk Cerebellar: Normal finger-to-nose  and normal heel-to-shin test.   Romberg negative Ambulates with a coordinated gait   Nursing note and vitals reviewed.    ED Treatments / Results  Labs (all labs ordered are listed, but only abnormal results are displayed) Labs Reviewed - No data to display  EKG  EKG Interpretation None       Radiology No results found.  Procedures Procedures (including critical care time)  Medications Ordered in ED Medications  acetaminophen (TYLENOL) tablet 650 mg (650 mg Oral Given 04/06/17 2356)     Initial Impression / Assessment and Plan / ED Course  I have reviewed the triage vital signs and the nursing notes.  Pertinent labs & imaging results that were available during my care of the patient were reviewed by me and considered in my medical decision making (see chart for details).     Vitals:   04/06/17 2342  BP: (!) 152/93  Pulse: 78  Resp: 18  Temp: 98.4 F (36.9 C)  TempSrc: Oral  SpO2: 100%    Medications  acetaminophen (TYLENOL) tablet 650 mg (650 mg Oral Given 04/06/17 2356)    Dustin Norris is 42 y.o. male presenting with HA. Presentation is like pts typical HA and non concerning for [facility], ICH, Meningitis, or temporal arteritis. Pt is afebrile with no focal neuro deficits, nuchal rigidity, or change in vision. Pt is to follow up with PCP to discuss prophylactic medication. Pt verbalizes understanding and is agreeable with plan to dc.  Evaluation does not show pathology that would require ongoing emergent intervention or inpatient treatment. Pt is hemodynamically stable and mentating appropriately. Discussed findings and plan with patient/guardian, who agrees with care plan. All questions answered. Return precautions  discussed and outpatient follow up given.      Final Clinical Impressions(s) / ED Diagnoses   Final diagnoses:  Nonintractable headache, unspecified chronicity pattern, unspecified headache type    ED Discharge Orders    None       Lynetta Mareisciotta, Mardella Laymanicole, PA-C 04/06/17 2357    Loren RacerYelverton, David, MD 04/11/17 [id number]

## 2017-04-07 ENCOUNTER — Encounter (HOSPITAL_COMMUNITY): Payer: Self-pay | Admitting: Emergency Medicine

## 2017-04-07 ENCOUNTER — Other Ambulatory Visit: Payer: Self-pay

## 2017-04-07 ENCOUNTER — Emergency Department (HOSPITAL_COMMUNITY)
Admission: EM | Admit: 2017-04-07 | Discharge: 2017-04-07 | Disposition: A | Discharge status: Home / Self Care | Payer: Self-pay | Attending: Emergency Medicine | Admitting: Emergency Medicine

## 2017-04-07 DIAGNOSIS — F17210 Nicotine dependence, cigarettes, uncomplicated: Secondary | ICD-10-CM | POA: Insufficient documentation

## 2017-04-07 DIAGNOSIS — R519 Headache, unspecified: Secondary | ICD-10-CM

## 2017-04-07 DIAGNOSIS — J45909 Unspecified asthma, uncomplicated: Secondary | ICD-10-CM | POA: Insufficient documentation

## 2017-04-07 DIAGNOSIS — R51 Headache: Secondary | ICD-10-CM | POA: Insufficient documentation

## 2017-04-07 MED ORDER — IBUPROFEN 800 MG PO TABS
800.0000 mg | ORAL_TABLET | Freq: Once | ORAL | Status: AC
  Administered 2017-04-07: 800 mg via ORAL
  Filled 2017-04-07: qty 1

## 2017-04-07 NOTE — ED Provider Notes (Signed)
[facility] EMERGENCY DEPARTMENT Provider Note   CSN: [id number] Arrival date & time: 04/07/17  2242     History   Chief Complaint Chief Complaint  Patient presents with  . Headache    HPI Dustin Norris is a 42 y.o. male.  The history is provided by the patient and medical records.    43 year old male with history of anxiety, asthma, cocaine abuse, homelessness, presenting to the ED with headache.  Patient is well-known to this ED for similar related complaints, this is his 87th ED visit in the past 6 months.  Reports headache all day, has not been able to take any medicine because he was at "work".  States he did eat and drink today but is currently hungry.  Headache is generalized, described as mild.  He does not have any dizziness, numbness, weakness, confusion, changes in speech, difficulty walking, blurred vision.  He is not on anticoagulation.  Denies any falls or head trauma.  Past Medical History:  Diagnosis Date  . Anxiety   . Asthma   . Cocaine abuse (HCC)   . Homelessness   . Migraine   . Tobacco abuse     Patient Active Problem List   Diagnosis Date Noted  . 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 Cocaine use disorder, moderate, dependence (HCC) 07/26/2016  . 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 Psychosis (HCC) 07/24/2016  . Chest pain 11/23/2015  . Asthma 11/23/2015  . Tobacco abuse 11/23/2015  . Elevated lactic acid level 11/23/2015  . Cocaine abuse (HCC)   . Concern about STD in male without diagnosis   . Paranoid ideation (HCC) 02/22/2015    History reviewed. No pertinent surgical history.     Home Medications    Prior to Admission medications   Not on File    Family History No family history on file.  Social History Social History   Tobacco Use  . Smoking status: Current Every Day Smoker    Packs/day: 1.00    Years: 0.00    Pack years: 0.00   Types: Cigarettes, Cigars  . Smokeless tobacco: Never Used  Substance Use Topics  . Alcohol use: Yes  . Drug use: No    Comment: last use October 2017     Allergies   Haldol [haloperidol lactate]   Review of Systems Review of Systems  Neurological: Positive for headaches.  All other systems reviewed and are negative.    Physical Exam Updated Vital Signs BP 130/72 (BP Location: Right Arm)   Pulse 90   Temp 98.1 F (36.7 C) (Oral)   Resp 16   Ht 5\' 7"  (1.702 m)   Wt 72.6 kg (160 lb)   SpO2 100%   BMI 25.06 kg/m   Physical Exam  Constitutional: He is oriented to person, place, and time. He appears well-developed and well-nourished. No distress.  No acute distress, laughing and smiling during exam  HENT:  Head: Normocephalic and atraumatic.  Right Ear: External ear normal.  Left Ear: External ear normal.  Mouth/Throat: Oropharynx is clear and moist.  Eyes: Conjunctivae and EOM are normal. Pupils are equal, round, and reactive to light.  Neck: Normal range of motion and full passive range of motion without pain. Neck supple. No neck rigidity.  No rigidity, no meningismus  Cardiovascular: Normal rate, regular rhythm and normal heart sounds.  No murmur heard. Pulmonary/Chest: Effort normal and breath sounds normal. No respiratory distress. He has no wheezes. He  has no rhonchi.  Abdominal: Soft. Bowel sounds are normal. There is no tenderness. There is no guarding.  Musculoskeletal: Normal range of motion. He exhibits no edema.  Neurological: He is alert and oriented to person, place, and time. He has normal strength. He displays no tremor. No cranial nerve deficit or sensory deficit. He displays no seizure activity.  AAOx3, answering questions and following commands appropriately; equal strength UE and LE bilaterally; CN grossly intact; moves all extremities appropriately without ataxia; no focal neuro deficits or facial asymmetry appreciated  Skin: Skin is warm and dry.  No rash noted. He is not diaphoretic.  Psychiatric: He has a normal mood and affect. His behavior is normal. Thought content normal.  Nursing note and vitals reviewed.    ED Treatments / Results  Labs (all labs ordered are listed, but only abnormal results are displayed) Labs Reviewed - No data to display  EKG  EKG Interpretation None       Radiology No results found.  Procedures Procedures (including critical care time)  Medications Ordered in ED Medications  ibuprofen (ADVIL,MOTRIN) tablet 800 mg (800 mg Oral Given 04/07/17 2338)     Initial Impression / Assessment and Plan / ED Course  I have reviewed the triage vital signs and the nursing notes.  Pertinent labs & imaging results that were available during my care of the patient were reviewed by me and considered in my medical decision making (see chart for details).  42 year old male here with headache.  He is well-known to this ED with similar complaints.  He is awake, alert, appropriately oriented today.  His neurologic exam is nonfocal.  No signs or symptoms concerning for meningitis.  Low suspicion for acute intracranial pathology at this time.  He was given Motrin here.  Will discharge home with symptomatic care.  Encouraged close PCP follow-up.  Discussed plan with patient, he acknowledged understanding and agreed with plan of care.  Return precautions given for new or worsening symptoms.  Final Clinical Impressions(s) / ED Diagnoses   Final diagnoses:  Bad headache    ED Discharge Orders    None       Garlon HatchetSanders, Lisa M, PA-C 04/07/17 2358    Geoffery Lyonselo, Douglas, MD 04/08/17 [id number]

## 2017-04-07 NOTE — ED Triage Notes (Signed)
Pt c/o HA that started today, pt been see on ED multiple times in the last 6 months for similar complain. Pt AO x 4 NAD noticed, no neuro symptoms.

## 2017-04-08 ENCOUNTER — Encounter (HOSPITAL_COMMUNITY): Payer: Self-pay

## 2017-04-08 ENCOUNTER — Emergency Department (HOSPITAL_COMMUNITY)
Admission: EM | Admit: 2017-04-08 | Discharge: 2017-04-09 | Disposition: A | Discharge status: Home / Self Care | Payer: Self-pay | Attending: Emergency Medicine | Admitting: Emergency Medicine

## 2017-04-08 ENCOUNTER — Other Ambulatory Visit: Payer: Self-pay

## 2017-04-08 DIAGNOSIS — G4489 Other headache syndrome: Secondary | ICD-10-CM | POA: Insufficient documentation

## 2017-04-08 DIAGNOSIS — J45909 Unspecified asthma, uncomplicated: Secondary | ICD-10-CM | POA: Insufficient documentation

## 2017-04-08 DIAGNOSIS — F17210 Nicotine dependence, cigarettes, uncomplicated: Secondary | ICD-10-CM | POA: Insufficient documentation

## 2017-04-08 MED ORDER — ACETAMINOPHEN 325 MG PO TABS
650.0000 mg | ORAL_TABLET | Freq: Once | ORAL | Status: AC
  Administered 2017-04-08: 650 mg via ORAL
  Filled 2017-04-08: qty 2

## 2017-04-08 NOTE — ED Triage Notes (Signed)
Pt reports a migraine. No neuro deficits. A&Ox4. Ambulatory. He also states that he is tired from working all day and wants a shower.

## 2017-04-08 NOTE — ED Provider Notes (Signed)
[facility]-EMERGENCY DEPT Provider Note   CSN: [id number] Arrival date & time: 04/08/17  1942     History   Chief Complaint Chief Complaint  Patient presents with  . Headache    HPI Dustin Norris is a 42 y.o. male.  42 year old male well-known to this department presents with chronic migraine as well as leg pain.  Patient states is been homeless has been walking around a lot.  Denies any ataxia.  No general weakness.  No fever or neck pain.  No photophobia.  Pain localized to his temples and similar to his prior episodes.  Seen her yesterday for similar symptoms.  No treatment used prior to arrival.  Requesting a shower and food      Past Medical History:  Diagnosis Date  . Anxiety   . Asthma   . Cocaine abuse (HCC)   . Homelessness   . Migraine   . Tobacco abuse     Patient Active Problem List   Diagnosis Date Noted  . 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 Cocaine use disorder, moderate, dependence (HCC) 07/26/2016  . 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 Psychosis (HCC) 07/24/2016  . Chest pain 11/23/2015  . Asthma 11/23/2015  . Tobacco abuse 11/23/2015  . Elevated lactic acid level 11/23/2015  . Cocaine abuse (HCC)   . Concern about STD in male without diagnosis   . Paranoid ideation (HCC) 02/22/2015    History reviewed. No pertinent surgical history.     Home Medications    Prior to Admission medications   Not on File    Family History History reviewed. No pertinent family history.  Social History Social History   Tobacco Use  . Smoking status: Current Every Day Smoker    Packs/day: 1.00    Years: 0.00    Pack years: 0.00    Types: Cigarettes, Cigars  . Smokeless tobacco: Never Used  Substance Use Topics  . Alcohol use: Yes  . Drug use: No    Comment: last use October 2017     Allergies   Haldol [haloperidol lactate]   Review of  Systems Review of Systems  All other systems reviewed and are negative.    Physical Exam Updated Vital Signs BP (!) 148/85 (BP Location: Left Arm)   Pulse 89   Temp 98.5 F (36.9 C) (Oral)   Resp 16   SpO2 97%   Physical Exam  Constitutional: He is oriented to person, place, and time. He appears well-developed and well-nourished.  Non-toxic appearance. No distress.  HENT:  Head: Normocephalic and atraumatic.  Eyes: Conjunctivae, EOM and lids are normal. Pupils are equal, round, and reactive to light.  Neck: Normal range of motion. Neck supple. No tracheal deviation present. No thyroid mass present.  Cardiovascular: Normal rate, regular rhythm and normal heart sounds. Exam reveals no gallop.  No murmur heard. Pulmonary/Chest: Effort normal and breath sounds normal. No stridor. No respiratory distress. He has no decreased breath sounds. He has no wheezes. He has no rhonchi. He has no rales.  Abdominal: Soft. Normal appearance and bowel sounds are normal. He exhibits no distension. There is no tenderness. There is no rebound and no CVA tenderness.  Musculoskeletal: Normal range of motion. He exhibits no edema or tenderness.  Neurological: He is alert and oriented to person, place, and time. He has normal strength. No cranial nerve deficit or sensory deficit. GCS eye subscore is 4. GCS verbal subscore is 5. GCS  motor subscore is 6.  Skin: Skin is warm and dry. No abrasion and no rash noted.  Psychiatric: He has a normal mood and affect. His speech is normal and behavior is normal.  Nursing note and vitals reviewed.    ED Treatments / Results  Labs (all labs ordered are listed, but only abnormal results are displayed) Labs Reviewed - No data to display  EKG  EKG Interpretation None       Radiology No results found.  Procedures Procedures (including critical care time)  Medications Ordered in ED Medications  acetaminophen (TYLENOL) tablet 650 mg (not administered)      Initial Impression / Assessment and Plan / ED Course  I have reviewed the triage vital signs and the nursing notes.  Pertinent labs & imaging results that were available during my care of the patient were reviewed by me and considered in my medical decision making (see chart for details).     Patient given Tylenol here and will be given referral for primary care  Final Clinical Impressions(s) / ED Diagnoses   Final diagnoses:  None    ED Discharge Orders    None       Lorre NickAllen, Amerigo Mcglory, MD 04/08/17 2348

## 2017-04-08 NOTE — ED Notes (Signed)
Bed: WTR7 Expected date:  Expected time:  Means of arrival:  Comments: 

## 2017-04-09 ENCOUNTER — Other Ambulatory Visit: Payer: Self-pay

## 2017-04-09 ENCOUNTER — Encounter (HOSPITAL_COMMUNITY): Payer: Self-pay

## 2017-04-09 DIAGNOSIS — F419 Anxiety disorder, unspecified: Secondary | ICD-10-CM | POA: Insufficient documentation

## 2017-04-09 DIAGNOSIS — F17210 Nicotine dependence, cigarettes, uncomplicated: Secondary | ICD-10-CM | POA: Insufficient documentation

## 2017-04-09 DIAGNOSIS — Z590 Homelessness: Secondary | ICD-10-CM | POA: Insufficient documentation

## 2017-04-09 DIAGNOSIS — J45909 Unspecified asthma, uncomplicated: Secondary | ICD-10-CM | POA: Insufficient documentation

## 2017-04-09 DIAGNOSIS — R51 Headache: Secondary | ICD-10-CM | POA: Insufficient documentation

## 2017-04-09 DIAGNOSIS — F1410 Cocaine abuse, uncomplicated: Secondary | ICD-10-CM | POA: Insufficient documentation

## 2017-04-09 NOTE — ED Triage Notes (Signed)
States eye pain headache since this am clear speech noted moves all extremities.

## 2017-04-09 NOTE — ED Notes (Signed)
Pt declined discharge vitals

## 2017-04-10 ENCOUNTER — Emergency Department (HOSPITAL_COMMUNITY): Admission: EM | Admit: 2017-04-10 | Discharge: 2017-04-11 | Discharge status: Against Medical Advice | Payer: Self-pay

## 2017-04-10 ENCOUNTER — Emergency Department (HOSPITAL_COMMUNITY)
Admission: EM | Admit: 2017-04-10 | Discharge: 2017-04-10 | Disposition: A | Discharge status: Home / Self Care | Payer: Self-pay

## 2017-04-10 ENCOUNTER — Emergency Department (HOSPITAL_COMMUNITY)
Admission: EM | Admit: 2017-04-10 | Discharge: 2017-04-10 | Disposition: A | Discharge status: Home / Self Care | Payer: Self-pay | Attending: Emergency Medicine | Admitting: Emergency Medicine

## 2017-04-10 DIAGNOSIS — R51 Headache: Secondary | ICD-10-CM

## 2017-04-10 DIAGNOSIS — R519 Headache, unspecified: Secondary | ICD-10-CM

## 2017-04-10 MED ORDER — IBUPROFEN 800 MG PO TABS
800.0000 mg | ORAL_TABLET | Freq: Once | ORAL | Status: AC
  Administered 2017-04-10: 800 mg via ORAL
  Filled 2017-04-10: qty 1

## 2017-04-10 NOTE — Discharge Instructions (Signed)
Follow-up with your primary care doctor. °Return here for any new/acute changes. °

## 2017-04-10 NOTE — ED Provider Notes (Signed)
[facility]-EMERGENCY DEPT Provider Note   CSN: [id number] Arrival date & time: 04/09/17  2218     History   Chief Complaint Chief Complaint  Patient presents with  . Eye Pain    HPI Dustin Norris is a 42 y.o. male.  The history is provided by the patient and medical records.  Eye Pain  Associated symptoms include headaches.    43 y.o. F with hx of anxiety, asthma, cocaine abuse, homelessness, migraine, tobacco abuse, presenting to the ED for headache.  He has history of migraines and reports it feels similar.  He is well known to his ED for similar related complaints, this is his 89th ED visit in 6 months.  He reports his eyes are hurting, feel like they are strained.  He has some light sensitivity but denies blurred/double vision, dizziness, confusion, numbness, weakness, confusion.  No fever, chills, neck pain.    Past Medical History:  Diagnosis Date  . Anxiety   . Asthma   . Cocaine abuse (HCC)   . Homelessness   . Migraine   . Tobacco abuse     Patient Active Problem List   Diagnosis Date Noted  . 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 Cocaine use disorder, moderate, dependence (HCC) 07/26/2016  . 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 Psychosis (HCC) 07/24/2016  . Chest pain 11/23/2015  . Asthma 11/23/2015  . Tobacco abuse 11/23/2015  . Elevated lactic acid level 11/23/2015  . Cocaine abuse (HCC)   . Concern about STD in male without diagnosis   . Paranoid ideation (HCC) 02/22/2015    History reviewed. No pertinent surgical history.     Home Medications    Prior to Admission medications   Not on File    Family History History reviewed. No pertinent family history.  Social History Social History   Tobacco Use  . Smoking status: Current Every Day Smoker    Packs/day: 1.00    Years: 0.00    Pack years: 0.00    Types: Cigarettes, Cigars  . Smokeless  tobacco: Never Used  Substance Use Topics  . Alcohol use: Yes  . Drug use: No    Comment: last use October 2017     Allergies   Haldol [haloperidol lactate]   Review of Systems Review of Systems  Eyes: Positive for pain.  Neurological: Positive for headaches.  All other systems reviewed and are negative.    Physical Exam Updated Vital Signs BP (!) 144/87 (BP Location: Right Arm)   Pulse 79   Temp 98.4 F (36.9 C) (Oral)   Resp 18   Ht 5\' 7"  (1.702 m)   Wt 72.6 kg (160 lb)   SpO2 98%   BMI 25.06 kg/m   Physical Exam  Constitutional: He is oriented to person, place, and time. He appears well-developed and well-nourished. No distress.  Sitting up in bed, all room lights on, watching TV  HENT:  Head: Normocephalic and atraumatic.  Right Ear: External ear normal.  Left Ear: External ear normal.  Mouth/Throat: Oropharynx is clear and moist.  Eyes: Conjunctivae and EOM are normal. Pupils are equal, round, and reactive to light.  Both eyes are normal in appearance, no FB or signs of trauma, EOMs fully intact, no nystagmus, no field cuts  Neck: Normal range of motion and full passive range of motion without pain. Neck supple. No neck rigidity.  No rigidity, no meningismus  Cardiovascular: Normal rate, regular rhythm and  normal heart sounds.  No murmur heard. Pulmonary/Chest: Effort normal and breath sounds normal. No respiratory distress. He has no wheezes. He has no rhonchi.  Abdominal: Soft. Bowel sounds are normal. There is no tenderness. There is no guarding.  Musculoskeletal: Normal range of motion. He exhibits no edema.  Neurological: He is alert and oriented to person, place, and time. He has normal strength. He displays no tremor. No cranial nerve deficit or sensory deficit. He displays no seizure activity.  AAOx3, answering questions and following commands appropriately; equal strength UE and LE bilaterally; CN grossly intact; moves all extremities appropriately  without ataxia; no focal neuro deficits or facial asymmetry appreciated  Skin: Skin is warm and dry. No rash noted. He is not diaphoretic.  Psychiatric: He has a normal mood and affect. His behavior is normal. Thought content normal.  Nursing note and vitals reviewed.    ED Treatments / Results  Labs (all labs ordered are listed, but only abnormal results are displayed) Labs Reviewed - No data to display  EKG  EKG Interpretation None       Radiology No results found.  Procedures Procedures (including critical care time)  Medications Ordered in ED Medications  ibuprofen (ADVIL,MOTRIN) tablet 800 mg (not administered)     Initial Impression / Assessment and Plan / ED Course  I have reviewed the triage vital signs and the nursing notes.  Pertinent labs & imaging results that were available during my care of the patient were reviewed by me and considered in my medical decision making (see chart for details).  42 y.o. M here with headache.  He has been seen 89 times in the past 6 months for similar related complaints.  He is neurologically intact here, sitting upright in bed watching TV.  There are no abnormalities noted about the eye on my exam.  Will treat headache with motrin, feel he is stable for discharge.  FU with PCP.  Discussed plan with patient, he acknowledged understanding and agreed with plan of care.  Return precautions given for new or worsening symptoms.  Final Clinical Impressions(s) / ED Diagnoses   Final diagnoses:  Bad headache    ED Discharge Orders    None       Garlon HatchetSanders, Labrea Eccleston M, PA-C 04/10/17 0422    Lorre NickAllen, Anthony, MD 04/10/17 [phone]

## 2017-04-11 ENCOUNTER — Encounter (HOSPITAL_COMMUNITY): Payer: Self-pay | Admitting: Family Medicine

## 2017-04-11 DIAGNOSIS — R51 Headache: Secondary | ICD-10-CM | POA: Insufficient documentation

## 2017-04-11 DIAGNOSIS — J45909 Unspecified asthma, uncomplicated: Secondary | ICD-10-CM | POA: Insufficient documentation

## 2017-04-11 DIAGNOSIS — F17210 Nicotine dependence, cigarettes, uncomplicated: Secondary | ICD-10-CM | POA: Insufficient documentation

## 2017-04-11 NOTE — ED Triage Notes (Signed)
Patient is complaining of a headache that started earlier today. Describes pain as generalized. Associated symptoms of his eyes hurting. Denies blurry vision, lightheadedness, nausea, or vomiting. Patient reports he took Tylenol with no relief.

## 2017-04-11 NOTE — ED Triage Notes (Signed)
Pt didn't answer when called for triage   

## 2017-04-11 NOTE — ED Triage Notes (Addendum)
Pt asleep in lobby and didn't answer name call

## 2017-04-11 NOTE — ED Triage Notes (Signed)
Pt sleeping in lobby, in no acute distress 

## 2017-04-12 ENCOUNTER — Encounter (HOSPITAL_COMMUNITY): Payer: Self-pay | Admitting: Emergency Medicine

## 2017-04-12 ENCOUNTER — Emergency Department (HOSPITAL_COMMUNITY)
Admission: EM | Admit: 2017-04-12 | Discharge: 2017-04-13 | Discharge status: Against Medical Advice | Payer: Self-pay | Attending: Emergency Medicine | Admitting: Emergency Medicine

## 2017-04-12 ENCOUNTER — Emergency Department (HOSPITAL_COMMUNITY)
Admission: EM | Admit: 2017-04-12 | Discharge: 2017-04-12 | Disposition: A | Discharge status: Home / Self Care | Payer: Self-pay | Attending: Emergency Medicine | Admitting: Emergency Medicine

## 2017-04-12 DIAGNOSIS — R519 Headache, unspecified: Secondary | ICD-10-CM

## 2017-04-12 DIAGNOSIS — R51 Headache: Secondary | ICD-10-CM

## 2017-04-12 DIAGNOSIS — Z5321 Procedure and treatment not carried out due to patient leaving prior to being seen by health care provider: Secondary | ICD-10-CM | POA: Insufficient documentation

## 2017-04-12 MED ORDER — IBUPROFEN 200 MG PO TABS
600.0000 mg | ORAL_TABLET | Freq: Once | ORAL | Status: AC
  Administered 2017-04-12: 600 mg via ORAL
  Filled 2017-04-12: qty 3

## 2017-04-12 MED ORDER — ACETAMINOPHEN 325 MG PO TABS
650.0000 mg | ORAL_TABLET | Freq: Once | ORAL | Status: AC
  Administered 2017-04-12: 650 mg via ORAL
  Filled 2017-04-12: qty 2

## 2017-04-12 NOTE — ED Provider Notes (Signed)
[facility]-EMERGENCY DEPT Provider Note   CSN: [id number] Arrival date & time: 04/11/17  2146     History   Chief Complaint Chief Complaint  Patient presents with  . Headache    HPI Dustin Norris is a 42 y.o. male.  HPI 42 year old African American male past medical history significant for anxiety, asthma, cocaine abuse, homelessness, migraines, tobacco abuse, elevated blood pressure presents to the emergency department today for evaluation of a headache.  States that started today.  He has a history of migraines and states this feels similar.  Patient is well-known to the ED.  This is 91 to visit in the past 6 months.  Patient with care plan in place.  Patient reports that his eyes are hurting bilaterally slightly restrained.  He reports some light sensitivity but denies any blurred vision or double vision.  Denies any associated dizziness, syncope, paresthesias, confusion, weakness, fevers, chills, neck pain.  Patient has not taken any for his pain prior to arrival.  Patient has been seen several times in the ED for migraine headaches.  Patient states the pain is all over.  Cannot describe the pain to me.  Nothing makes better or worse. Past Medical History:  Diagnosis Date  . Anxiety   . Asthma   . Cocaine abuse (HCC)   . Homelessness   . Migraine   . Tobacco abuse     Patient Active Problem List   Diagnosis Date Noted  . 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 Cocaine use disorder, moderate, dependence (HCC) 07/26/2016  . 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 Psychosis (HCC) 07/24/2016  . Chest pain 11/23/2015  . Asthma 11/23/2015  . Tobacco abuse 11/23/2015  . Elevated lactic acid level 11/23/2015  . Cocaine abuse (HCC)   . Concern about STD in male without diagnosis   . Paranoid ideation (HCC) 02/22/2015    History reviewed. No pertinent surgical history.     Home  Medications    Prior to Admission medications   Not on File    Family History History reviewed. No pertinent family history.  Social History Social History   Tobacco Use  . Smoking status: Current Every Day Smoker    Packs/day: 1.00    Years: 0.00    Pack years: 0.00    Types: Cigarettes, Cigars  . Smokeless tobacco: Never Used  Substance Use Topics  . Alcohol use: No    Frequency: Never    Comment: Previous history.   . Drug use: No    Comment: last use October 2017     Allergies   Haldol [haloperidol lactate]   Review of Systems Review of Systems  All other systems reviewed and are negative.    Physical Exam Updated Vital Signs BP 111/74 (BP Location: Right Arm)   Pulse (!) 59   Temp 99.1 F (37.3 C) (Oral)   Resp 16   Ht 5\' 7"  (1.702 m)   Wt 72.6 kg (160 lb)   SpO2 100%   BMI 25.06 kg/m   Physical Exam  Constitutional: He appears well-developed and well-nourished. No distress.  Patient with lights on and watching TV appears to be in no acute distress.  HENT:  Head: Normocephalic and atraumatic.  Eyes: Conjunctivae and EOM are normal. Pupils are equal, round, and reactive to light. Right eye exhibits no discharge. Left eye exhibits no discharge. No scleral icterus.  Neck: Normal range of motion. Neck supple. No neck rigidity.  No c spine midline tenderness. No paraspinal tenderness. No deformities or step offs noted. Full ROM. Supple. No nuchal rigidity.    Pulmonary/Chest: No respiratory distress.  Musculoskeletal: Normal range of motion.  Neurological: He is alert. GCS eye subscore is 4. GCS verbal subscore is 5. GCS motor subscore is 6.  The patient is alert, attentive, and oriented x 3. Speech is clear. Cranial nerve II-VII grossly intact. Negative pronator drift. Sensation intact. Strength 5/5 in all extremities. Reflexes 2+ and symmetric at biceps, triceps, knees, and ankles. Rapid alternating movement and fine finger movements intact. Romberg  is absent. Posture and gait normal.   Skin: Skin is warm and dry. Capillary refill takes less than 2 seconds. No pallor.  Psychiatric: His behavior is normal. Judgment and thought content normal.  Nursing note and vitals reviewed.    ED Treatments / Results  Labs (all labs ordered are listed, but only abnormal results are displayed) Labs Reviewed - No data to display  EKG  EKG Interpretation None       Radiology No results found.  Procedures Procedures (including critical care time)  Medications Ordered in ED Medications  acetaminophen (TYLENOL) tablet 650 mg (not administered)  ibuprofen (ADVIL,MOTRIN) tablet 600 mg (not administered)     Initial Impression / Assessment and Plan / ED Course  I have reviewed the triage vital signs and the nursing notes.  Pertinent labs & imaging results that were available during my care of the patient were reviewed by me and considered in my medical decision making (see chart for details).     Pt HA treated and improved while in ED.  Presentation is like pts typical HA and non concerning for [facility], ICH, Meningitis, or temporal arteritis. Pt is afebrile with no focal neuro deficits, nuchal rigidity, or change in vision.  Patient has been seen 91 times in the past 6 months for similar related complaints.  Patient watching TV and appears to be in no acute distress.  Will treat headache with Motrin and Tylenol.  Pt is to follow up with PCP to discuss prophylactic medication. Pt verbalizes understanding and is agreeable with plan to dc.   Pt is hemodynamically stable, in NAD, & able to ambulate in the ED. Evaluation does not show pathology that would require ongoing emergent intervention or inpatient treatment. I explained the diagnosis to the patient. Pain has been managed & has no complaints prior to dc. Pt is comfortable with above plan and is stable for discharge at this time. All questions were answered prior to disposition. Strict return  precautions for f/u to the ED were discussed. Encouraged follow up with PCP.   Final Clinical Impressions(s) / ED Diagnoses   Final diagnoses:  Bad headache    ED Discharge Orders    None       Wallace KellerLeaphart, Abu Heavin T, PA-C 04/12/17 0435    Dione BoozeGlick, David, MD 04/12/17 [phone]

## 2017-04-12 NOTE — ED Triage Notes (Signed)
Patient blisters to bilateral feet x2 days.

## 2017-04-12 NOTE — ED Notes (Signed)
Bed: WTR9 Expected date:  Expected time:  Means of arrival:  Comments: 

## 2017-04-13 NOTE — ED Notes (Signed)
Patient stated when he was called to go to a room that he did not want to be seen. He stated he wanted to be checked out of the ED.

## 2017-04-14 ENCOUNTER — Emergency Department (HOSPITAL_COMMUNITY)
Admission: EM | Admit: 2017-04-14 | Discharge: 2017-04-14 | Disposition: A | Discharge status: Home / Self Care | Payer: Self-pay | Attending: Emergency Medicine | Admitting: Emergency Medicine

## 2017-04-14 ENCOUNTER — Encounter (HOSPITAL_COMMUNITY): Payer: Self-pay | Admitting: Emergency Medicine

## 2017-04-14 DIAGNOSIS — R51 Headache: Secondary | ICD-10-CM | POA: Insufficient documentation

## 2017-04-14 DIAGNOSIS — Z5321 Procedure and treatment not carried out due to patient leaving prior to being seen by health care provider: Secondary | ICD-10-CM | POA: Insufficient documentation

## 2017-04-14 DIAGNOSIS — R519 Headache, unspecified: Secondary | ICD-10-CM

## 2017-04-14 NOTE — ED Triage Notes (Signed)
Patient here from home with complaints of headache that started earlier today. Denies n/v. Ambulatory.

## 2017-04-14 NOTE — ED Notes (Signed)
Bed: WTR7 Expected date:  Expected time:  Means of arrival:  Comments: 

## 2017-04-15 ENCOUNTER — Emergency Department (HOSPITAL_COMMUNITY): Admission: EM | Admit: 2017-04-15 | Discharge: 2017-04-15 | Discharge status: Against Medical Advice | Payer: Self-pay

## 2017-04-15 DIAGNOSIS — M79604 Pain in right leg: Secondary | ICD-10-CM | POA: Insufficient documentation

## 2017-04-15 DIAGNOSIS — Z590 Homelessness: Secondary | ICD-10-CM | POA: Insufficient documentation

## 2017-04-15 DIAGNOSIS — F29 Unspecified psychosis not due to a substance or known physiological condition: Secondary | ICD-10-CM | POA: Insufficient documentation

## 2017-04-15 DIAGNOSIS — F17210 Nicotine dependence, cigarettes, uncomplicated: Secondary | ICD-10-CM | POA: Insufficient documentation

## 2017-04-15 DIAGNOSIS — J45909 Unspecified asthma, uncomplicated: Secondary | ICD-10-CM | POA: Insufficient documentation

## 2017-04-15 NOTE — ED Notes (Signed)
Called for triage  No response from lobby 

## 2017-04-15 NOTE — ED Triage Notes (Signed)
Pt called for triage. No answer.

## 2017-04-15 NOTE — ED Triage Notes (Signed)
Called for triage. No response. 

## 2017-04-16 ENCOUNTER — Encounter (HOSPITAL_COMMUNITY): Payer: Self-pay | Admitting: Emergency Medicine

## 2017-04-16 ENCOUNTER — Other Ambulatory Visit: Payer: Self-pay

## 2017-04-16 ENCOUNTER — Emergency Department (HOSPITAL_COMMUNITY)
Admission: EM | Admit: 2017-04-16 | Discharge: 2017-04-16 | Disposition: A | Discharge status: Home / Self Care | Payer: Self-pay | Attending: Emergency Medicine | Admitting: Emergency Medicine

## 2017-04-16 DIAGNOSIS — Z5900 Homelessness unspecified: Secondary | ICD-10-CM

## 2017-04-16 DIAGNOSIS — J45909 Unspecified asthma, uncomplicated: Secondary | ICD-10-CM | POA: Insufficient documentation

## 2017-04-16 DIAGNOSIS — F17210 Nicotine dependence, cigarettes, uncomplicated: Secondary | ICD-10-CM | POA: Insufficient documentation

## 2017-04-16 DIAGNOSIS — B353 Tinea pedis: Secondary | ICD-10-CM | POA: Insufficient documentation

## 2017-04-16 NOTE — ED Notes (Signed)
Bed: [facility]PT4 Expected date:  Expected time:  Means of arrival:  Comments: 

## 2017-04-16 NOTE — ED Notes (Signed)
Pt called from the lobby with no response 

## 2017-04-16 NOTE — ED Triage Notes (Signed)
Pt presents to ED complaining of blisters on his feet. States he works at [employer] and stands on his feet a lot. Patient requesting new socks and possibly an ace bandage for his feet.

## 2017-04-16 NOTE — ED Provider Notes (Signed)
[facility]-EMERGENCY DEPT Provider Note   CSN: [id number] Arrival date & time: 04/15/17  2145     History   Chief Complaint No chief complaint on file.   HPI Dustin Norris is a 42 y.o. male.  The history is provided by the patient.  Illness  This is a chronic problem. The current episode started more than 1 week ago. The problem occurs constantly. The problem has not changed since onset.Pertinent negatives include no chest pain, no abdominal pain, no headaches and no shortness of breath. Associated symptoms comments: Pain in legs . Nothing aggravates the symptoms. Nothing relieves the symptoms. He has tried nothing for the symptoms. The treatment provided no relief.  States he would like a blanket and something to drink.    Past Medical History:  Diagnosis Date  . Anxiety   . Asthma   . Cocaine abuse (HCC)   . Homelessness   . Migraine   . Tobacco abuse     Patient Active Problem List   Diagnosis Date Noted  . 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 Cocaine use disorder, moderate, dependence (HCC) 07/26/2016  . 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 Psychosis (HCC) 07/24/2016  . Chest pain 11/23/2015  . Asthma 11/23/2015  . Tobacco abuse 11/23/2015  . Elevated lactic acid level 11/23/2015  . Cocaine abuse (HCC)   . Concern about STD in male without diagnosis   . Paranoid ideation (HCC) 02/22/2015    No past surgical history on file.     Home Medications    Prior to Admission medications   Not on File    Family History No family history on file.  Social History Social History   Tobacco Use  . Smoking status: Current Every Day Smoker    Packs/day: 1.00    Years: 0.00    Pack years: 0.00    Types: Cigarettes, Cigars  . Smokeless tobacco: Never Used  Substance Use Topics  . Alcohol use: No    Frequency: Never    Comment: Previous history.   . Drug use: No   Comment: last use October 2017     Allergies   Haldol [haloperidol lactate]   Review of Systems Review of Systems  Constitutional: Negative for fever.  Respiratory: Negative for shortness of breath.   Cardiovascular: Negative for chest pain, palpitations and leg swelling.  Gastrointestinal: Negative for abdominal pain.  Musculoskeletal: Positive for arthralgias. Negative for joint swelling.  Neurological: Negative for headaches.  All other systems reviewed and are negative.    Physical Exam Updated Vital Signs There were no vitals taken for this visit.  Physical Exam  Constitutional: He is oriented to person, place, and time. He appears well-developed and well-nourished. No distress.  Preferring to sleep  HENT:  Head: Normocephalic and atraumatic.  Mouth/Throat: No oropharyngeal exudate.  Eyes: Conjunctivae are normal. Pupils are equal, round, and reactive to light.  Neck: Normal range of motion. Neck supple.  Cardiovascular: Normal rate, regular rhythm, normal heart sounds and intact distal pulses.  Pulmonary/Chest: Effort normal and breath sounds normal. No stridor. He has no wheezes. He has no rales. He exhibits no tenderness.  Abdominal: Soft. Bowel sounds are normal. He exhibits no mass. There is no tenderness. There is no rebound and no guarding.  Musculoskeletal: Normal range of motion.       Right knee: Normal.       Left knee: Normal.       Right  ankle: Normal. Achilles tendon normal.       Left ankle: Normal. Achilles tendon normal.       Right lower leg: Normal.       Left lower leg: Normal.  Neurological: He is alert and oriented to person, place, and time. He displays normal reflexes.  Skin: Skin is warm and dry. Capillary refill takes less than 2 seconds.     ED Treatments / Results  L Procedures Procedures (including critical care time)     Final Clinical Impressions(s) / ED Diagnoses   Final diagnoses:  Homelessness    Patient would like to  eat and sleep.  He cannot elucidate his complaint and promptly goes back to sleep.    Return for weakness, numbness, changes in vision or speech, fevers >100.4 unrelieved by medication, shortness of breath, intractable vomiting, or diarrhea, abdominal pain, Inability to tolerate liquids or food, cough, altered mental status or any concerns. No signs of systemic illness or infection. The patient is nontoxic-appearing on exam and vital signs are within normal limits.   I have reviewed the triage vital signs and the nursing notes. Pertinent labs &imaging results that were available during my care of the patient were reviewed by me and considered in my medical decision making (see chart for details).  After history, exam, and medical workup I feel the patient has been appropriately medically screened and is safe for discharge home. Pertinent diagnoses were discussed with the patient. Patient was given return precautions.    Jazlen Ogarro, MD 04/16/17 0151

## 2017-04-16 NOTE — ED Triage Notes (Signed)
Patient complaining of foot pain. Patient has not injured himself.

## 2017-04-17 ENCOUNTER — Encounter (HOSPITAL_COMMUNITY): Payer: Self-pay

## 2017-04-17 ENCOUNTER — Emergency Department (HOSPITAL_COMMUNITY)
Admission: EM | Admit: 2017-04-17 | Discharge: 2017-04-17 | Disposition: A | Discharge status: Home / Self Care | Payer: Self-pay | Attending: Emergency Medicine | Admitting: Emergency Medicine

## 2017-04-17 ENCOUNTER — Other Ambulatory Visit: Payer: Self-pay

## 2017-04-17 DIAGNOSIS — F17210 Nicotine dependence, cigarettes, uncomplicated: Secondary | ICD-10-CM | POA: Insufficient documentation

## 2017-04-17 DIAGNOSIS — R51 Headache: Secondary | ICD-10-CM | POA: Insufficient documentation

## 2017-04-17 DIAGNOSIS — G43909 Migraine, unspecified, not intractable, without status migrainosus: Secondary | ICD-10-CM | POA: Insufficient documentation

## 2017-04-17 DIAGNOSIS — J45909 Unspecified asthma, uncomplicated: Secondary | ICD-10-CM | POA: Insufficient documentation

## 2017-04-17 DIAGNOSIS — B353 Tinea pedis: Secondary | ICD-10-CM

## 2017-04-17 DIAGNOSIS — Z590 Homelessness: Secondary | ICD-10-CM | POA: Insufficient documentation

## 2017-04-17 MED ORDER — NYSTATIN 100000 UNIT/GM EX POWD: Freq: Four times a day (QID) | CUTANEOUS | 1 refills | Status: DC

## 2017-04-17 MED ORDER — ACETAMINOPHEN 500 MG PO TABS
1000.0000 mg | ORAL_TABLET | Freq: Once | ORAL | Status: AC
  Administered 2017-04-17: 1000 mg via ORAL
  Filled 2017-04-17: qty 2

## 2017-04-17 NOTE — ED Triage Notes (Signed)
Pt BIB [facility]. He reports headache. States that it is his normal pain. A&Ox4.

## 2017-04-17 NOTE — ED Triage Notes (Signed)
Upon being awakened to be asked to leave our facility (he was in our E.D. Waiting room); he c/o generalized headache. He is in no distress and he ambulates without difficulty. Dr. Clarene DukeLittle is seeing him as I write this.

## 2017-04-17 NOTE — ED Notes (Signed)
Pt sleeping soundly in the lobby.

## 2017-04-17 NOTE — Discharge Instructions (Signed)
Nystatin powder as prescribed.  Follow-up with your primary doctor as needed if not improving.

## 2017-04-17 NOTE — ED Provider Notes (Signed)
[facility]-EMERGENCY DEPT Provider Note   CSN: [id number] Arrival date & time: 04/16/17  1630     History   Chief Complaint Chief Complaint  Patient presents with  . Foot Blisters    HPI Dustin Norris is a 42 y.o. male.  Patient is a 42 year old male with history of homelessness, cocaine abuse, and psychiatric issues.  He is well-known to the emergency department for frequent visits involving various complaints, most common of which is swelling, pain, and rash to his feet.  This is why returns today.  He is requesting a medication to put on his feet and new socks (he is wearing hospital slippers).  No fevers or chills, no injury.   The history is provided by the patient.    Past Medical History:  Diagnosis Date  . Anxiety   . Asthma   . Cocaine abuse (HCC)   . Homelessness   . Migraine   . Tobacco abuse     Patient Active Problem List   Diagnosis Date Noted  . 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 Cocaine use disorder, moderate, dependence (HCC) 07/26/2016  . 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 Psychosis (HCC) 07/24/2016  . Chest pain 11/23/2015  . Asthma 11/23/2015  . Tobacco abuse 11/23/2015  . Elevated lactic acid level 11/23/2015  . Cocaine abuse (HCC)   . Concern about STD in male without diagnosis   . Paranoid ideation (HCC) 02/22/2015    History reviewed. No pertinent surgical history.     Home Medications    Prior to Admission medications   Not on File    Family History History reviewed. No pertinent family history.  Social History Social History   Tobacco Use  . Smoking status: Current Every Day Smoker    Packs/day: 1.00    Years: 0.00    Pack years: 0.00    Types: Cigarettes, Cigars  . Smokeless tobacco: Never Used  Substance Use Topics  . Alcohol use: No    Frequency: Never    Comment: Previous history.   . Drug use: No    Comment: last  use October 2017     Allergies   Haldol [haloperidol lactate]   Review of Systems Review of Systems  All other systems reviewed and are negative.    Physical Exam Updated Vital Signs BP (!) 142/88 (BP Location: Left Arm)   Pulse 82   Temp 99.2 F (37.3 C) (Oral)   Resp 18   SpO2 100%   Physical Exam  Constitutional: He is oriented to person, place, and time. He appears well-developed and well-nourished.  Patient appears somewhat dissheveled and unkempt.    HENT:  Head: Normocephalic and atraumatic.  Neck: Normal range of motion. Neck supple.  Pulmonary/Chest: Effort normal.  Neurological: He is alert and oriented to person, place, and time.  Skin: Skin is warm and dry.  Both feet appear mildly inflamed to the soles with cracking between the toes.    Nursing note and vitals reviewed.    ED Treatments / Results  Labs (all labs ordered are listed, but only abnormal results are displayed) Labs Reviewed - No data to display  EKG  EKG Interpretation None       Radiology No results found.  Procedures Procedures (including critical care time)  Medications Ordered in ED Medications - No data to display   Initial Impression / Assessment and Plan / ED Course  I have reviewed the triage vital  signs and the nursing notes.  Pertinent labs & imaging results that were available during my care of the patient were reviewed by me and considered in my medical decision making (see chart for details).  Patient with tinea pedis.  Will treat with nystatin powder.  New hospital slippers given at patient's request.  Follow up as needed.  Final Clinical Impressions(s) / ED Diagnoses   Final diagnoses:  None    ED Discharge Orders    None       Geoffery Lyonselo, Alias Villagran, MD 04/17/17 [phone]

## 2017-04-17 NOTE — ED Notes (Signed)
Called for triage no answer  

## 2017-04-17 NOTE — ED Provider Notes (Signed)
[facility]-EMERGENCY DEPT Provider Note   CSN: [id number] Arrival date & time: 04/17/17  0900     History   Chief Complaint Chief Complaint  Patient presents with  . Headache    HPI Dustin Norris is a 42 y.o. male.  42 year old male with past medical history below including 96 ED visits in the last 6 months who presents with migraine headache.  Patient was just discharged a few minutes ago by my colleague after he presented with foot blisters.  He checks back in now for his chronic migraine headaches which she states started after he was discharged.  No medications prior to arrival.  He has been ambulatory without problems.   The history is provided by the patient.  Headache   Pertinent negatives include no vomiting.    Past Medical History:  Diagnosis Date  . Anxiety   . Asthma   . Cocaine abuse (HCC)   . Homelessness   . Migraine   . Tobacco abuse     Patient Active Problem List   Diagnosis Date Noted  . 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 Cocaine use disorder, moderate, dependence (HCC) 07/26/2016  . 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 Psychosis (HCC) 07/24/2016  . Chest pain 11/23/2015  . Asthma 11/23/2015  . Tobacco abuse 11/23/2015  . Elevated lactic acid level 11/23/2015  . Cocaine abuse (HCC)   . Concern about STD in male without diagnosis   . Paranoid ideation (HCC) 02/22/2015    No past surgical history on file.     Home Medications    Prior to Admission medications   Medication Sig Start Date End Date Taking? Authorizing Provider  nystatin (MYCOSTATIN/NYSTOP) powder Apply topically 4 (four) times daily. 04/17/17   Geoffery Lyonselo, Douglas, MD    Family History No family history on file.  Social History Social History   Tobacco Use  . Smoking status: Current Every Day Smoker    Packs/day: 1.00    Years: 0.00    Pack years: 0.00    Types: Cigarettes,  Cigars  . Smokeless tobacco: Never Used  Substance Use Topics  . Alcohol use: No    Frequency: Never    Comment: Previous history.   . Drug use: No    Comment: last use October 2017     Allergies   Haldol [haloperidol lactate]   Review of Systems Review of Systems  Gastrointestinal: Negative for vomiting.  Skin: Positive for wound.  Neurological: Positive for headaches. Negative for weakness.  All other systems reviewed and are negative.    Physical Exam Updated Vital Signs BP (!) 132/91 (BP Location: Right Arm)   Pulse 83   Temp 98.9 F (37.2 C) (Oral)   Resp 14   SpO2 100%   Physical Exam  Constitutional: He is oriented to person, place, and time. He appears well-developed and well-nourished. No distress.  Awake, alert Disheveled appearance  HENT:  Head: Normocephalic and atraumatic.  Eyes: Conjunctivae and EOM are normal. Pupils are equal, round, and reactive to light.  Neck: Neck supple.  Cardiovascular: Normal rate, regular rhythm and normal heart sounds.  No murmur heard. Pulmonary/Chest: Effort normal and breath sounds normal. No respiratory distress.  Abdominal: Soft. Bowel sounds are normal. He exhibits no distension. There is no tenderness.  Musculoskeletal: He exhibits no edema.  Neurological: He is alert and oriented to person, place, and time. He has normal reflexes. No cranial nerve deficit. He exhibits normal muscle  tone.  Fluent speech, normal finger-to-nose testing, normal gait 5/5 strength and normal sensation x all 4 extremities  Skin: Skin is warm and dry.  Psychiatric: Judgment and thought content normal.  Bizarre affect; was laying on the floor with pants down when I walked into room  Nursing note and vitals reviewed.    ED Treatments / Results  Labs (all labs ordered are listed, but only abnormal results are displayed) Labs Reviewed - No data to display  EKG  EKG Interpretation None       Radiology No results  found.  Procedures Procedures (including critical care time)  Medications Ordered in ED Medications  acetaminophen (TYLENOL) tablet 1,000 mg (not administered)     Initial Impression / Assessment and Plan / ED Course  I have reviewed the triage vital signs and the nursing notes.      PT ambulatory and well appearing. Afebrile. No focal neuro deficits. Gave tylenol in the ED.  Final Clinical Impressions(s) / ED Diagnoses   Final diagnoses:  Migraine without status migrainosus, not intractable, unspecified migraine type    ED Discharge Orders    None       Adiel Erney, Ambrose Finland, MD 04/17/17 1011

## 2017-04-17 NOTE — ED Notes (Signed)
Bed: [facility] Expected date:  Expected time:  Means of arrival:  Comments: 

## 2017-04-18 ENCOUNTER — Emergency Department (HOSPITAL_COMMUNITY)
Admission: EM | Admit: 2017-04-18 | Discharge: 2017-04-18 | Disposition: A | Discharge status: Home / Self Care | Payer: Self-pay | Attending: Emergency Medicine | Admitting: Emergency Medicine

## 2017-04-18 ENCOUNTER — Encounter (HOSPITAL_COMMUNITY): Payer: Self-pay | Admitting: Emergency Medicine

## 2017-04-18 ENCOUNTER — Emergency Department (HOSPITAL_COMMUNITY)
Admission: EM | Admit: 2017-04-18 | Discharge: 2017-04-19 | Disposition: A | Discharge status: Home / Self Care | Payer: Self-pay | Attending: Emergency Medicine | Admitting: Emergency Medicine

## 2017-04-18 DIAGNOSIS — R519 Headache, unspecified: Secondary | ICD-10-CM

## 2017-04-18 DIAGNOSIS — Z5321 Procedure and treatment not carried out due to patient leaving prior to being seen by health care provider: Secondary | ICD-10-CM | POA: Insufficient documentation

## 2017-04-18 DIAGNOSIS — R51 Headache: Secondary | ICD-10-CM

## 2017-04-18 MED ORDER — ACETAMINOPHEN 325 MG PO TABS
650.0000 mg | ORAL_TABLET | Freq: Once | ORAL | Status: AC
  Administered 2017-04-18: 650 mg via ORAL
  Filled 2017-04-18: qty 2

## 2017-04-18 NOTE — ED Provider Notes (Signed)
[facility]-EMERGENCY DEPT Provider Note   CSN: [id number] Arrival date & time: 04/17/17  2327     History   Chief Complaint Chief Complaint  Patient presents with  . Headache    HPI Newton Frutiger is a 42 y.o. male.  The history is provided by the patient and medical records.  Headache      42 y.o. M with hx of anxiety, asthma, cocaine abuse, homelessness, migraine headaches, presenting to the ED for headache.  Patient well known to this facility-- this is his 97th ED visit in the past 6 months.  States headache is his usual headache.  Nothing is really different.  He would like some medicine for his headache here.  Past Medical History:  Diagnosis Date  . Anxiety   . Asthma   . Cocaine abuse (HCC)   . Homelessness   . Migraine   . Tobacco abuse     Patient Active Problem List   Diagnosis Date Noted  . 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 Cocaine use disorder, moderate, dependence (HCC) 07/26/2016  . 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 Psychosis (HCC) 07/24/2016  . Chest pain 11/23/2015  . Asthma 11/23/2015  . Tobacco abuse 11/23/2015  . Elevated lactic acid level 11/23/2015  . Cocaine abuse (HCC)   . Concern about STD in male without diagnosis   . Paranoid ideation (HCC) 02/22/2015    History reviewed. No pertinent surgical history.     Home Medications    Prior to Admission medications   Medication Sig Start Date End Date Taking? Authorizing Provider  nystatin (MYCOSTATIN/NYSTOP) powder Apply topically 4 (four) times daily. 04/17/17   Geoffery Lyons, MD    Family History History reviewed. No pertinent family history.  Social History Social History   Tobacco Use  . Smoking status: Current Every Day Smoker    Packs/day: 1.00    Years: 0.00    Pack years: 0.00    Types: Cigarettes, Cigars  . Smokeless tobacco: Never Used  Substance Use Topics  . Alcohol  use: No    Frequency: Never    Comment: Previous history.   . Drug use: No    Comment: last use October 2017     Allergies   Haldol [haloperidol lactate]   Review of Systems Review of Systems  Neurological: Positive for headaches.  All other systems reviewed and are negative.    Physical Exam Updated Vital Signs BP 138/86 (BP Location: Right Arm)   Pulse 79   Temp 98.9 F (37.2 C) (Oral)   Resp 15   SpO2 98%   Physical Exam  Constitutional: He is oriented to person, place, and time. He appears well-developed and well-nourished. No distress.  HENT:  Head: Normocephalic and atraumatic.  Right Ear: External ear normal.  Left Ear: External ear normal.  Eyes: Conjunctivae and EOM are normal. Pupils are equal, round, and reactive to light.  Neck: Normal range of motion and full passive range of motion without pain. Neck supple. No neck rigidity.  No rigidity, no meningismus  Cardiovascular: Normal rate, regular rhythm and normal heart sounds.  No murmur heard. Pulmonary/Chest: Effort normal and breath sounds normal. No respiratory distress. He has no wheezes. He has no rhonchi.  Abdominal: Soft. Bowel sounds are normal. There is no tenderness. There is no guarding.  Musculoskeletal: Normal range of motion. He exhibits no edema.  Neurological: He is alert and oriented to person, place, and time. He  has normal strength. He displays no tremor. No cranial nerve deficit or sensory deficit. He displays no seizure activity.  AAOx3, answering questions and following commands appropriately; equal strength UE and LE bilaterally; CN grossly intact; moves all extremities appropriately without ataxia; no focal neuro deficits or facial asymmetry appreciated  Skin: Skin is warm and dry. No rash noted. He is not diaphoretic.  Psychiatric: He has a normal mood and affect. His behavior is normal. Thought content normal.  Nursing note and vitals reviewed.    ED Treatments / Results   Labs (all labs ordered are listed, but only abnormal results are displayed) Labs Reviewed - No data to display  EKG  EKG Interpretation None       Radiology No results found.  Procedures Procedures (including critical care time)  Medications Ordered in ED Medications - No data to display   Initial Impression / Assessment and Plan / ED Course  I have reviewed the triage vital signs and the nursing notes.  Pertinent labs & imaging results that were available during my care of the patient were reviewed by me and considered in my medical decision making (see chart for details).  42 year old male here with headache.  He is well-known to this ED, this is actually his 97th ED visit in the past 6 months for similar related complaints.  He has been sleeping here, no acute distress.  No focal neurologic deficits or infectious symptoms.  Feel he can be safely discharged.  Strongly encouraged to follow-up with his primary care doctor.  Return precautions given.  Final Clinical Impressions(s) / ED Diagnoses   Final diagnoses:  Chronic nonintractable headache, unspecified headache type    ED Discharge Orders    None       Garlon HatchetSanders, Lisa M, PA-C 04/18/17 [place] [id number]    Wickline, Donald, MD 04/18/17 [phone]

## 2017-04-18 NOTE — ED Triage Notes (Signed)
Patient here from home with complaints of headache that started today. States "I really dont know why im here".

## 2017-04-19 ENCOUNTER — Emergency Department (HOSPITAL_COMMUNITY)
Admission: EM | Admit: 2017-04-19 | Discharge: 2017-04-20 | Discharge status: Against Medical Advice | Payer: Self-pay | Attending: Emergency Medicine | Admitting: Emergency Medicine

## 2017-04-19 DIAGNOSIS — Z5321 Procedure and treatment not carried out due to patient leaving prior to being seen by health care provider: Secondary | ICD-10-CM | POA: Insufficient documentation

## 2017-04-19 NOTE — ED Notes (Signed)
Called for room, no response.  

## 2017-04-19 NOTE — ED Triage Notes (Signed)
Pt called for recheck of v/s No response 2nd attempt

## 2017-04-19 NOTE — ED Triage Notes (Signed)
CALLED PT FOR V/S 1ST ATTEMPT NO RESPONSE 

## 2017-04-20 ENCOUNTER — Encounter (HOSPITAL_COMMUNITY): Payer: Self-pay

## 2017-04-20 ENCOUNTER — Encounter (HOSPITAL_COMMUNITY): Payer: Self-pay | Admitting: Emergency Medicine

## 2017-04-20 ENCOUNTER — Other Ambulatory Visit: Payer: Self-pay

## 2017-04-20 DIAGNOSIS — Z590 Homelessness: Secondary | ICD-10-CM | POA: Insufficient documentation

## 2017-04-20 DIAGNOSIS — R51 Headache: Secondary | ICD-10-CM | POA: Insufficient documentation

## 2017-04-20 DIAGNOSIS — F17210 Nicotine dependence, cigarettes, uncomplicated: Secondary | ICD-10-CM | POA: Insufficient documentation

## 2017-04-20 DIAGNOSIS — B353 Tinea pedis: Secondary | ICD-10-CM | POA: Insufficient documentation

## 2017-04-20 NOTE — ED Notes (Signed)
No answer from lobby  

## 2017-04-20 NOTE — ED Triage Notes (Signed)
Pt states he is under a lot of stress and is having a lot of problems  Pt states he would like to talk to a Child psychotherapistsocial worker while he is here  Pt states he is homeless and has no where to go

## 2017-04-20 NOTE — ED Triage Notes (Signed)
Pt presents with c/o headache and bilateral foot pain. Pt reports he always has headaches and that he has blisters on both of his feet and it here for some powder that he received last time he was here.

## 2017-04-21 ENCOUNTER — Emergency Department (HOSPITAL_COMMUNITY)
Admission: EM | Admit: 2017-04-21 | Discharge: 2017-04-21 | Disposition: A | Discharge status: Home / Self Care | Payer: Self-pay | Attending: Emergency Medicine | Admitting: Emergency Medicine

## 2017-04-21 DIAGNOSIS — R519 Headache, unspecified: Secondary | ICD-10-CM

## 2017-04-21 DIAGNOSIS — R51 Headache: Secondary | ICD-10-CM

## 2017-04-21 DIAGNOSIS — Z5321 Procedure and treatment not carried out due to patient leaving prior to being seen by health care provider: Secondary | ICD-10-CM | POA: Insufficient documentation

## 2017-04-21 MED ORDER — ACETAMINOPHEN 325 MG PO TABS
650.0000 mg | ORAL_TABLET | Freq: Once | ORAL | Status: AC
  Administered 2017-04-21: 650 mg via ORAL
  Filled 2017-04-21: qty 2

## 2017-04-21 MED ORDER — NYSTATIN 100000 UNIT/GM EX POWD: Freq: Four times a day (QID) | CUTANEOUS | 1 refills | Status: DC

## 2017-04-21 NOTE — Discharge Instructions (Addendum)
Please get the prescription filled.  Use it as directed.  We have printed another prescription for your convenience.

## 2017-04-21 NOTE — ED Provider Notes (Signed)
[facility]-EMERGENCY DEPT Provider Note   CSN: [id number] Arrival date & time: 04/20/17  2343     History   Chief Complaint Chief Complaint  Patient presents with  . Headache  . Foot Pain    HPI Dustin Norris is a 42 y.o. male.  Patient presents to the ED with a chief complaint of headache.  He states that this is his typical headache.  He states that he has also had some bilateral foot itching and blisters.  He denies any new numbness, weakness, or tingling.  There are no modifying factors.  He has not taken anything for his symptoms.  He has been seen 98 times in the last 6 months, many of which for the same symptoms.  The history is provided by the patient. No language interpreter was used.    Past Medical History:  Diagnosis Date  . Anxiety   . Asthma   . Cocaine abuse (HCC)   . Homelessness   . Migraine   . Tobacco abuse     Patient Active Problem List   Diagnosis Date Noted  . 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 Cocaine use disorder, moderate, dependence (HCC) 07/26/2016  . 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 Psychosis (HCC) 07/24/2016  . Chest pain 11/23/2015  . Asthma 11/23/2015  . Tobacco abuse 11/23/2015  . Elevated lactic acid level 11/23/2015  . Cocaine abuse (HCC)   . Concern about STD in male without diagnosis   . Paranoid ideation (HCC) 02/22/2015    History reviewed. No pertinent surgical history.     Home Medications    Prior to Admission medications   Medication Sig Start Date End Date Taking? Authorizing Provider  acetaminophen (TYLENOL) 325 MG tablet Take 650 mg by mouth every 6 (six) hours as needed for moderate pain.    [provider]  nystatin (MYCOSTATIN/NYSTOP) powder Apply topically 4 (four) times daily. 04/17/17   Geoffery Lyonselo, Douglas, MD    Family History History reviewed. No pertinent family history.  Social History Social  History   Tobacco Use  . Smoking status: Current Every Day Smoker    Packs/day: 1.00    Years: 0.00    Pack years: 0.00    Types: Cigarettes, Cigars  . Smokeless tobacco: Never Used  Substance Use Topics  . Alcohol use: No    Frequency: Never    Comment: Previous history.   . Drug use: No    Comment: last use October 2017     Allergies   Haldol [haloperidol lactate]   Review of Systems Review of Systems  All other systems reviewed and are negative.    Physical Exam Updated Vital Signs BP 114/84 (BP Location: Right Wrist)   Pulse 74   Temp 97.7 F (36.5 C) (Oral)   Resp 17   Ht 5\' 7"  (1.702 m)   Wt 72.6 kg (160 lb)   SpO2 100%   BMI 25.06 kg/m   Physical Exam  Constitutional: He is oriented to person, place, and time. He appears well-developed and well-nourished.  HENT:  Head: Normocephalic and atraumatic.  Right Ear: External ear normal.  Left Ear: External ear normal.  Eyes: Pupils are equal, round, and reactive to light. Conjunctivae and EOM are normal. Right eye exhibits no discharge. Left eye exhibits no discharge. No scleral icterus.  Neck: Normal range of motion. Neck supple. No JVD present.  No pain with neck flexion, no meningismus  Cardiovascular: Normal rate,  regular rhythm and normal heart sounds. Exam reveals no gallop and no friction rub.  No murmur heard. Pulmonary/Chest: Effort normal and breath sounds normal. No respiratory distress. He has no wheezes. He has no rales. He exhibits no tenderness.  Abdominal: Soft. He exhibits no distension and no mass. There is no tenderness. There is no rebound and no guarding.  Musculoskeletal: Normal range of motion. He exhibits no edema or tenderness.  Normal gait.  Neurological: He is alert and oriented to person, place, and time. He has normal reflexes.  CN 3-12 intact, normal finger to nose, no pronator drift, sensation and strength intact bilaterally.  Skin: Skin is warm and dry.  Tinea pedis of  bilateral feet  Psychiatric: He has a normal mood and affect. His behavior is normal. Judgment and thought content normal.  Nursing note and vitals reviewed.    ED Treatments / Results  Labs (all labs ordered are listed, but only abnormal results are displayed) Labs Reviewed - No data to display  EKG  EKG Interpretation None       Radiology No results found.  Procedures Procedures (including critical care time)  Medications Ordered in ED Medications - No data to display   Initial Impression / Assessment and Plan / ED Course  I have reviewed the triage vital signs and the nursing notes.  Pertinent labs & imaging results that were available during my care of the patient were reviewed by me and considered in my medical decision making (see chart for details).    Patient with typical headache.  Also has tinea pedis and asks for foot powder.  Will reprint his prescription.  No new acute findings.  Final Clinical Impressions(s) / ED Diagnoses   Final diagnoses:  Nonintractable headache, unspecified chronicity pattern, unspecified headache type    ED Discharge Orders    None       Roxy Horseman, PA-C 04/21/17 0526    Dione Booze, MD 04/21/17 [phone]

## 2017-04-22 ENCOUNTER — Other Ambulatory Visit: Payer: Self-pay

## 2017-04-22 ENCOUNTER — Emergency Department (HOSPITAL_COMMUNITY)
Admission: EM | Admit: 2017-04-22 | Discharge: 2017-04-22 | Disposition: A | Discharge status: Home / Self Care | Payer: Self-pay | Attending: Emergency Medicine | Admitting: Emergency Medicine

## 2017-04-22 ENCOUNTER — Emergency Department (HOSPITAL_COMMUNITY)
Admission: EM | Admit: 2017-04-22 | Discharge: 2017-04-22 | Discharge status: Against Medical Advice | Payer: Self-pay | Attending: Emergency Medicine | Admitting: Emergency Medicine

## 2017-04-22 ENCOUNTER — Encounter (HOSPITAL_COMMUNITY): Payer: Self-pay | Admitting: Emergency Medicine

## 2017-04-22 DIAGNOSIS — G43909 Migraine, unspecified, not intractable, without status migrainosus: Secondary | ICD-10-CM | POA: Insufficient documentation

## 2017-04-22 DIAGNOSIS — Z5321 Procedure and treatment not carried out due to patient leaving prior to being seen by health care provider: Secondary | ICD-10-CM | POA: Insufficient documentation

## 2017-04-22 DIAGNOSIS — F17210 Nicotine dependence, cigarettes, uncomplicated: Secondary | ICD-10-CM | POA: Insufficient documentation

## 2017-04-22 DIAGNOSIS — M79672 Pain in left foot: Secondary | ICD-10-CM | POA: Insufficient documentation

## 2017-04-22 DIAGNOSIS — M79671 Pain in right foot: Secondary | ICD-10-CM | POA: Insufficient documentation

## 2017-04-22 DIAGNOSIS — G8929 Other chronic pain: Secondary | ICD-10-CM | POA: Insufficient documentation

## 2017-04-22 DIAGNOSIS — Z590 Homelessness: Secondary | ICD-10-CM | POA: Insufficient documentation

## 2017-04-22 NOTE — ED Notes (Signed)
Bed: WTR5 Expected date:  Expected time:  Means of arrival:  Comments: 

## 2017-04-22 NOTE — Discharge Instructions (Signed)
You were seen in the ED today foot pain. Please follow up with the PCP.

## 2017-04-22 NOTE — ED Notes (Signed)
Pt called from the lobby with no repsonse 

## 2017-04-22 NOTE — ED Notes (Signed)
Pt yelling from triage room for a blanket. Told pt I would get him one in just a second. Pt then began cussing and yelling. Explained to pt that if continues this behavior he would be escorted to the lobby. Pt continued to cuss loudly. Pt escorted to lobby by security.

## 2017-04-22 NOTE — ED Provider Notes (Signed)
Emergency Department Provider Note   I have reviewed the triage vital signs and the nursing notes.   HISTORY  Chief Complaint Foot Pain   HPI Dustin Norris is a 42 y.o. male with PMH of cocaine abuse, homelessness, and chronic pain presents to the emergency department for evaluation of bilateral foot pain.  He has been seen in the emergency department multiple times for this issue. He was evaluated last night for this pain.  Patient states he will not show me the feet because he showed the prior physician.  Denies any other symptoms.  No radiation of symptoms or modifying factors.   Past Medical History:  Diagnosis Date  . Anxiety   . Asthma   . Cocaine abuse (HCC)   . Homelessness   . Migraine   . Tobacco abuse     Patient Active Problem List   Diagnosis Date Noted  . 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 Cocaine use disorder, moderate, dependence (HCC) 07/26/2016  . 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 Psychosis (HCC) 07/24/2016  . Chest pain 11/23/2015  . Asthma 11/23/2015  . Tobacco abuse 11/23/2015  . Elevated lactic acid level 11/23/2015  . Cocaine abuse (HCC)   . Concern about STD in male without diagnosis   . Paranoid ideation (HCC) 02/22/2015    No past surgical history on file.  Current Outpatient Rx  . Order #: [id number] Class: Historical Med  . Order #: [id number] Class: Print    Allergies Haldol [haloperidol lactate]  No family history on file.  Social History Social History   Tobacco Use  . Smoking status: Current Every Day Smoker    Packs/day: 1.00    Years: 0.00    Pack years: 0.00    Types: Cigarettes, Cigars  . Smokeless tobacco: Never Used  Substance Use Topics  . Alcohol use: No    Frequency: Never    Comment: Previous history.   . Drug use: No    Comment: last use October 2017    Review of Systems  Constitutional: No fever/chills Eyes: No visual  changes. Gastrointestinal: No abdominal pain.  No nausea, no vomiting.  No diarrhea.  No constipation. Genitourinary: Negative for dysuria. Musculoskeletal: Negative for back pain. Positive foot pain.   10-point ROS otherwise negative.  ____________________________________________   PHYSICAL EXAM:  VITAL SIGNS: ED Triage Vitals  Enc Vitals Group     BP 04/22/17 2247 (!) 143/97     Pulse Rate 04/22/17 2247 84     Resp 04/22/17 2247 16     Temp 04/22/17 2247 98.2 F (36.8 C)     Temp Source 04/22/17 2247 Oral     SpO2 04/22/17 2247 100 %     Pain Score 04/22/17 2248 5   Constitutional: Alert and oriented. Well appearing and in no acute distress. Eyes: Conjunctivae are normal.  Head: Atraumatic. Nose: No congestion/rhinnorhea. Mouth/Throat: Mucous membranes are moist.  Neck: No stridor.  Cardiovascular: Appears well perfused.  Respiratory: Normal respiratory effort.  Gastrointestinal: No distention.  Musculoskeletal:No gross deformities of extremities. Neurologic:  Normal speech and language. Skin:  Skin is warm, dry and intact.   ____________________________________________   PROCEDURES  Procedure(s) performed:   Procedures  None ____________________________________________   INITIAL IMPRESSION / ASSESSMENT AND PLAN / ED COURSE  Pertinent labs & imaging results that were available during my care of the patient were reviewed by me and considered in my medical decision making (see chart for details).  Presents  to the emergency department for evaluation of foot pain.  He will not let me examine his feet he has been verbally abusive with staff.  I provided a medical screening exam and believe the patient is safe for discharge.  I have provided follow-up information for a low/no cost PCP.  He was prescribed medication for his feet during his most recent ED visit. Plan for discharge at this time.    ____________________________________________  FINAL CLINICAL  IMPRESSION(S) / ED DIAGNOSES  Final diagnoses:  Foot pain, bilateral    Note:  This document was prepared using Dragon voice recognition software and may include unintentional dictation errors.  Alona BeneJoshua Kinsey Cowsert, MD Emergency Medicine    Trula Frede, Arlyss RepressJoshua G, MD 04/22/17 [phone]

## 2017-04-23 ENCOUNTER — Emergency Department (HOSPITAL_COMMUNITY)
Admission: EM | Admit: 2017-04-23 | Discharge: 2017-04-23 | Disposition: A | Discharge status: Home / Self Care | Payer: Self-pay | Attending: Emergency Medicine | Admitting: Emergency Medicine

## 2017-04-23 DIAGNOSIS — F17210 Nicotine dependence, cigarettes, uncomplicated: Secondary | ICD-10-CM | POA: Insufficient documentation

## 2017-04-23 DIAGNOSIS — J45909 Unspecified asthma, uncomplicated: Secondary | ICD-10-CM | POA: Insufficient documentation

## 2017-04-23 DIAGNOSIS — Z79899 Other long term (current) drug therapy: Secondary | ICD-10-CM | POA: Insufficient documentation

## 2017-04-23 DIAGNOSIS — R519 Headache, unspecified: Secondary | ICD-10-CM

## 2017-04-23 DIAGNOSIS — R51 Headache: Secondary | ICD-10-CM | POA: Insufficient documentation

## 2017-04-23 NOTE — ED Notes (Signed)
Security and [employer] at bedside attempting to escort pt out of triage

## 2017-04-23 NOTE — ED Notes (Signed)
Pt refusing to sign e-signature

## 2017-04-23 NOTE — ED Triage Notes (Signed)
[place]... [facility]. Pt reports migraine, was given Tylenol by EMS.

## 2017-04-23 NOTE — ED Notes (Signed)
[place] stating [place] I got people on the way to fuck all yall up"

## 2017-04-23 NOTE — ED Notes (Signed)
Pt escorted to waiting room by Security, pt continues to cuss and yell in the lobby. Pt being escorted off property by security.

## 2017-04-23 NOTE — ED Notes (Signed)
Dr. Patria Maneampos to triage to DC pt, pt in room cussing at him. Stating "fuck yall you all are foolish, the only way I'm leaving this property is if yall take me to jail"

## 2017-04-23 NOTE — ED Provider Notes (Signed)
[facility] EMERGENCY DEPARTMENT Provider Note   CSN: [id number] Arrival date & time: 04/23/17  0228     History   Chief Complaint Chief Complaint  Patient presents with  . Headache    HPI Dustin Norris is a 42 y.o. male.  HPI Pt reports migraine HA. Hx of HA before in the past. No head trauma or fevers. No other complaints. No weakness of arms or legs. No change in vision. Denies vomiting   Past Medical History:  Diagnosis Date  . Anxiety   . Asthma   . Cocaine abuse (HCC)   . Homelessness   . Migraine   . Tobacco abuse     Patient Active Problem List   Diagnosis Date Noted  . 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 Cocaine use disorder, moderate, dependence (HCC) 07/26/2016  . 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 Psychosis (HCC) 07/24/2016  . Chest pain 11/23/2015  . Asthma 11/23/2015  . Tobacco abuse 11/23/2015  . Elevated lactic acid level 11/23/2015  . Cocaine abuse (HCC)   . Concern about STD in male without diagnosis   . Paranoid ideation (HCC) 02/22/2015    No past surgical history on file.      Home Medications    Prior to Admission medications   Medication Sig Start Date End Date Taking? Authorizing Provider  acetaminophen (TYLENOL) 325 MG tablet Take 650 mg by mouth every 6 (six) hours as needed for moderate pain.    [provider]  nystatin (MYCOSTATIN/NYSTOP) powder Apply topically 4 (four) times daily. 04/21/17   Roxy Horseman, PA-C    Family History No family history on file.  Social History Social History   Tobacco Use  . Smoking status: Current Every Day Smoker    Packs/day: 1.00    Years: 0.00    Pack years: 0.00    Types: Cigarettes, Cigars  . Smokeless tobacco: Never Used  Substance Use Topics  . Alcohol use: No    Frequency: Never    Comment: Previous history.   . Drug use: No    Comment: last use October 2017      Allergies   Haldol [haloperidol lactate]   Review of Systems Review of Systems  All other systems reviewed and are negative.    Physical Exam Updated Vital Signs BP 136/86 (BP Location: Right Arm)   Pulse 71   Temp 98.3 F (36.8 C) (Oral)   Resp 18   Ht  (1.702 m)   Wt 72.6 kg (160 lb)   SpO2 100%   BMI 25.06 kg/m   Physical Exam  Constitutional: He is oriented to person, place, and time. He appears well-developed and well-nourished.  HENT:  Head: Normocephalic and atraumatic.  Eyes: Pupils are equal, round, and reactive to light.  Cardiovascular: Regular rhythm.  Pulmonary/Chest: Effort normal.  Abdominal: Soft.  Musculoskeletal: Normal range of motion.  Neurological: He is alert and oriented to person, place, and time.  5/5 strength in major muscle groups of  bilateral upper and lower extremities. Speech normal. No facial asymetry.   Nursing note and vitals reviewed.    ED Treatments / Results  Labs (all labs ordered are listed, but only abnormal results are displayed) Labs Reviewed - No data to display  EKG None  Radiology No results found.  Procedures Procedures (including critical care time)  Medications Ordered in ED Medications - No data to display   Initial Impression / Assessment and  Plan / ED Course  I have reviewed the triage vital signs and the nursing notes.  Pertinent labs & imaging results that were available during my care of the patient were reviewed by me and considered in my medical decision making (see chart for details).     Well appearing. Mild HA improving now after tylenol by EMS. Normal neuro exam. Dc home. pcp follow up. Understands to return to the ER for new or worsening symptoms  Final Clinical Impressions(s) / ED Diagnoses   Final diagnoses:  None    ED Discharge Orders    None       Azalia Bilis, MD 04/23/17 [phone]

## 2017-04-25 ENCOUNTER — Encounter (HOSPITAL_COMMUNITY): Payer: Self-pay

## 2017-04-25 DIAGNOSIS — X58XXXA Exposure to other specified factors, initial encounter: Secondary | ICD-10-CM | POA: Insufficient documentation

## 2017-04-25 DIAGNOSIS — Y999 Unspecified external cause status: Secondary | ICD-10-CM | POA: Insufficient documentation

## 2017-04-25 DIAGNOSIS — F17210 Nicotine dependence, cigarettes, uncomplicated: Secondary | ICD-10-CM | POA: Insufficient documentation

## 2017-04-25 DIAGNOSIS — S90822A Blister (nonthermal), left foot, initial encounter: Secondary | ICD-10-CM | POA: Insufficient documentation

## 2017-04-25 DIAGNOSIS — Z590 Homelessness: Secondary | ICD-10-CM | POA: Insufficient documentation

## 2017-04-25 DIAGNOSIS — Y929 Unspecified place or not applicable: Secondary | ICD-10-CM | POA: Insufficient documentation

## 2017-04-25 DIAGNOSIS — Y939 Activity, unspecified: Secondary | ICD-10-CM | POA: Insufficient documentation

## 2017-04-25 NOTE — ED Triage Notes (Signed)
Pt complains of bilateral feet and leg pain for two days

## 2017-04-26 ENCOUNTER — Emergency Department (HOSPITAL_COMMUNITY)
Admission: EM | Admit: 2017-04-26 | Discharge: 2017-04-26 | Disposition: A | Discharge status: Home / Self Care | Payer: Self-pay | Attending: Emergency Medicine | Admitting: Emergency Medicine

## 2017-04-26 DIAGNOSIS — S90822A Blister (nonthermal), left foot, initial encounter: Secondary | ICD-10-CM

## 2017-04-26 MED ORDER — ACETAMINOPHEN 325 MG PO TABS
650.0000 mg | ORAL_TABLET | Freq: Once | ORAL | Status: AC
  Administered 2017-04-26: 650 mg via ORAL
  Filled 2017-04-26: qty 2

## 2017-04-26 NOTE — ED Provider Notes (Signed)
[facility]-EMERGENCY DEPT Provider Note   CSN: [id number] Arrival date & time: 04/25/17  2142     History   Chief Complaint Chief Complaint  Patient presents with  . Leg Pain    HPI Dustin Norris is a 42 y.o. male.  HPI  42 year old homeless man comes in with chief complaint of feet pain.  Patient has history of leg pain and recently has had some blisters in his feet.  Patient states that it hurts to walk.  He denies any nausea, vomiting, fevers, chills.  Patient has a pair of shoes and socks.  Denies getting wet recently.  Past Medical History:  Diagnosis Date  . Anxiety   . Asthma   . Cocaine abuse (HCC)   . Homelessness   . Migraine   . Tobacco abuse     Patient Active Problem List   Diagnosis Date Noted  . 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 Cocaine use disorder, moderate, dependence (HCC) 07/26/2016  . 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 Psychosis (HCC) 07/24/2016  . Chest pain 11/23/2015  . Asthma 11/23/2015  . Tobacco abuse 11/23/2015  . Elevated lactic acid level 11/23/2015  . Cocaine abuse (HCC)   . Concern about STD in male without diagnosis   . Paranoid ideation (HCC) 02/22/2015    History reviewed. No pertinent surgical history.      Home Medications    Prior to Admission medications   Medication Sig Start Date End Date Taking? Authorizing Provider  acetaminophen (TYLENOL) 325 MG tablet Take 650 mg by mouth every 6 (six) hours as needed for moderate pain.    [provider]  nystatin (MYCOSTATIN/NYSTOP) powder Apply topically 4 (four) times daily. 04/21/17   Roxy Horseman, PA-C    Family History History reviewed. No pertinent family history.  Social History Social History   Tobacco Use  . Smoking status: Current Every Day Smoker    Packs/day: 1.00    Years: 0.00    Pack years: 0.00    Types: Cigarettes, Cigars  . Smokeless  tobacco: Never Used  Substance Use Topics  . Alcohol use: No    Frequency: Never    Comment: Previous history.   . Drug use: No    Comment: last use October 2017     Allergies   Haldol [haloperidol lactate]   Review of Systems Review of Systems  Constitutional: Positive for activity change.  Musculoskeletal: Positive for arthralgias and myalgias.  Skin: Positive for rash.     Physical Exam Updated Vital Signs BP 140/90 (BP Location: Left Arm)   Pulse 83   Temp 98.6 F (37 C) (Oral)   Resp 14   SpO2 99%   Physical Exam  Constitutional: He appears well-developed. No distress.  Cardiovascular: Normal rate.  Pulmonary/Chest: Effort normal.  Musculoskeletal: He exhibits no edema.  Patient's left sole has an unroofed blister which is tender to palpation. No active bleeding or signs of infection.  Neurological: He is alert.  Skin: Skin is warm.  Nursing note and vitals reviewed.    ED Treatments / Results  Labs (all labs ordered are listed, but only abnormal results are displayed) Labs Reviewed - No data to display  EKG None  Radiology No results found.  Procedures Procedures (including critical care time)  Medications Ordered in ED Medications  acetaminophen (TYLENOL) tablet 650 mg (650 mg Oral Given 04/26/17 0048)     Initial Impression / Assessment and Plan /  ED Course  I have reviewed the triage vital signs and the nursing notes.  Pertinent labs & imaging results that were available during my care of the patient were reviewed by me and considered in my medical decision making (see chart for details).     42 year old comes in with chief complaint of feet pain due to blisters.  Patient is also complaining of leg pain.  Patient's legs are normal in appearance without any pitting edema.  Patient allowed me to check his left foot, which has an unroofed blister.  There is no evidence of infection.  We will discharge patient home, with blister care  discussion completed.  Final Clinical Impressions(s) / ED Diagnoses   Final diagnoses:  Friction blisters of sole of left foot, initial encounter    ED Discharge Orders    None       Derwood KaplanNanavati, Loan Oguin, MD 04/26/17 [phone]

## 2017-04-26 NOTE — ED Notes (Signed)
Patient asleep in hallway bed.

## 2017-04-26 NOTE — Discharge Instructions (Addendum)
Read the instructions on foot care that has been provided.

## 2017-04-27 ENCOUNTER — Emergency Department (HOSPITAL_COMMUNITY)
Admission: EM | Admit: 2017-04-27 | Discharge: 2017-04-27 | Discharge status: Against Medical Advice | Payer: Self-pay | Attending: Emergency Medicine | Admitting: Emergency Medicine

## 2017-04-27 ENCOUNTER — Encounter (HOSPITAL_COMMUNITY): Payer: Self-pay

## 2017-04-27 ENCOUNTER — Other Ambulatory Visit: Payer: Self-pay

## 2017-04-27 DIAGNOSIS — F17210 Nicotine dependence, cigarettes, uncomplicated: Secondary | ICD-10-CM | POA: Insufficient documentation

## 2017-04-27 DIAGNOSIS — R519 Headache, unspecified: Secondary | ICD-10-CM

## 2017-04-27 DIAGNOSIS — R51 Headache: Secondary | ICD-10-CM | POA: Insufficient documentation

## 2017-04-27 DIAGNOSIS — J45909 Unspecified asthma, uncomplicated: Secondary | ICD-10-CM | POA: Insufficient documentation

## 2017-04-27 NOTE — ED Provider Notes (Signed)
[facility]-EMERGENCY DEPT Provider Note   CSN: [id number] Arrival date & time: 04/27/17  0109     History   Chief Complaint Chief Complaint  Patient presents with  . Headache  . Leg Pain    HPI Dustin Norris is a 42 y.o. male.  HPI 42 yo male without complaints at this time. Well known to this ER. No complaints at this time. No fevers. No HA.     Past Medical History:  Diagnosis Date  . Anxiety   . Asthma   . Cocaine abuse (HCC)   . Homelessness   . Migraine   . Tobacco abuse     Patient Active Problem List   Diagnosis Date Noted  . 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 Cocaine use disorder, moderate, dependence (HCC) 07/26/2016  . 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 Psychosis (HCC) 07/24/2016  . Chest pain 11/23/2015  . Asthma 11/23/2015  . Tobacco abuse 11/23/2015  . Elevated lactic acid level 11/23/2015  . Cocaine abuse (HCC)   . Concern about STD in male without diagnosis   . Paranoid ideation (HCC) 02/22/2015    History reviewed. No pertinent surgical history.      Home Medications    Prior to Admission medications   Medication Sig Start Date End Date Taking? Authorizing Provider  acetaminophen (TYLENOL) 325 MG tablet Take 650 mg by mouth every 6 (six) hours as needed for moderate pain.    [provider]  nystatin (MYCOSTATIN/NYSTOP) powder Apply topically 4 (four) times daily. 04/21/17   Roxy Horseman, PA-C    Family History History reviewed. No pertinent family history.  Social History Social History   Tobacco Use  . Smoking status: Current Every Day Smoker    Packs/day: 1.00    Years: 0.00    Pack years: 0.00    Types: Cigarettes, Cigars  . Smokeless tobacco: Never Used  Substance Use Topics  . Alcohol use: No    Frequency: Never    Comment: Previous history.   . Drug use: No    Comment: last use October 2017      Allergies   Haldol [haloperidol lactate]   Review of Systems Review of Systems  All other systems reviewed and are negative.    Physical Exam Updated Vital Signs BP 121/87 (BP Location: Left Arm)   Pulse 88   Temp 98.4 F (36.9 C) (Oral)   Resp 18   SpO2 98%   Physical Exam  Constitutional: He is oriented to person, place, and time. He appears well-developed and well-nourished.  HENT:  Head: Normocephalic.  Eyes: EOM are normal.  Neck: Normal range of motion.  Pulmonary/Chest: Effort normal.  Abdominal: He exhibits no distension.  Musculoskeletal: Normal range of motion.  Neurological: He is alert and oriented to person, place, and time.  Psychiatric: He has a normal mood and affect.  Nursing note and vitals reviewed.    ED Treatments / Results  Labs (all labs ordered are listed, but only abnormal results are displayed) Labs Reviewed - No data to display  EKG None  Radiology No results found.  Procedures Procedures (including critical care time)  Medications Ordered in ED Medications - No data to display   Initial Impression / Assessment and Plan / ED Course  I have reviewed the triage vital signs and the nursing notes.  Pertinent labs & imaging results that were available during my care of the patient were reviewed by me  and considered in my medical decision making (see chart for details).     Pt walked into the ER on his own. He states he no longer wants to be seen and has no complaints. He would like to go home. He grabbed his bag and walked back out of the ER  Final Clinical Impressions(s) / ED Diagnoses   Final diagnoses:  None    ED Discharge Orders    None       Azalia Bilisampos, Remona Boom, MD 04/27/17 [phone]

## 2017-04-27 NOTE — ED Triage Notes (Signed)
Pt asleep in lobby

## 2017-04-27 NOTE — ED Notes (Signed)
Campos MD spoke with pt regarding discharge. Pt left prior to receiving paperwork; refused to sign.

## 2017-04-27 NOTE — ED Triage Notes (Signed)
Pt reports bilateral leg pain and a headache. He reports that his symptoms are the same as they usually are. A&Ox4. Ambulatory,

## 2017-04-28 ENCOUNTER — Encounter (HOSPITAL_COMMUNITY): Payer: Self-pay

## 2017-04-28 DIAGNOSIS — R51 Headache: Secondary | ICD-10-CM | POA: Insufficient documentation

## 2017-04-28 DIAGNOSIS — F17210 Nicotine dependence, cigarettes, uncomplicated: Secondary | ICD-10-CM | POA: Insufficient documentation

## 2017-04-29 ENCOUNTER — Other Ambulatory Visit: Payer: Self-pay

## 2017-04-29 ENCOUNTER — Emergency Department (HOSPITAL_COMMUNITY)
Admission: EM | Admit: 2017-04-29 | Discharge: 2017-04-29 | Disposition: A | Discharge status: Home / Self Care | Payer: Self-pay | Attending: Emergency Medicine | Admitting: Emergency Medicine

## 2017-04-29 DIAGNOSIS — F1410 Cocaine abuse, uncomplicated: Secondary | ICD-10-CM | POA: Insufficient documentation

## 2017-04-29 DIAGNOSIS — R51 Headache: Secondary | ICD-10-CM | POA: Insufficient documentation

## 2017-04-29 DIAGNOSIS — Z590 Homelessness: Secondary | ICD-10-CM | POA: Insufficient documentation

## 2017-04-29 DIAGNOSIS — F419 Anxiety disorder, unspecified: Secondary | ICD-10-CM | POA: Insufficient documentation

## 2017-04-29 DIAGNOSIS — F17210 Nicotine dependence, cigarettes, uncomplicated: Secondary | ICD-10-CM | POA: Insufficient documentation

## 2017-04-29 DIAGNOSIS — R519 Headache, unspecified: Secondary | ICD-10-CM

## 2017-04-29 DIAGNOSIS — J45909 Unspecified asthma, uncomplicated: Secondary | ICD-10-CM | POA: Insufficient documentation

## 2017-04-29 DIAGNOSIS — F17290 Nicotine dependence, other tobacco product, uncomplicated: Secondary | ICD-10-CM | POA: Insufficient documentation

## 2017-04-29 DIAGNOSIS — G8929 Other chronic pain: Secondary | ICD-10-CM | POA: Insufficient documentation

## 2017-04-29 MED ORDER — ACETAMINOPHEN 325 MG PO TABS
650.0000 mg | ORAL_TABLET | Freq: Once | ORAL | Status: AC
  Administered 2017-04-29: 650 mg via ORAL
  Filled 2017-04-29: qty 2

## 2017-04-29 NOTE — ED Notes (Signed)
Bed: WTR6 Expected date:  Expected time:  Means of arrival:  Comments: 

## 2017-04-29 NOTE — ED Provider Notes (Signed)
[name]-EMERGENCY DEPT Provider Note   CSN: [id number] Arrival date & time: 04/29/17  0414     History   Chief Complaint No chief complaint on file.   HPI Dustin Norris is a 42 y.o. male.  42 year old male who presents with chronic migraines.  He has multiple visits to the ED over the past several months.  Headache is bitemporal and without red flags for subarachnoid.  No mental status changes, fever, neck pain.  No emesis.  Of note, patient seen here several hours ago for same thing.  Patient does have a difficult social situation.  I have directly asked the patient had he followed up with the resources that he has received multiple times and he said no.  No treatment used prior to arrival.     Past Medical History:  Diagnosis Date  . Anxiety   . Asthma   . Cocaine abuse (HCC)   . Homelessness   . Migraine   . Tobacco abuse     Patient Active Problem List   Diagnosis Date Noted  . 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 Cocaine use disorder, moderate, dependence (HCC) 07/26/2016  . 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 Psychosis (HCC) 07/24/2016  . Chest pain 11/23/2015  . Asthma 11/23/2015  . Tobacco abuse 11/23/2015  . Elevated lactic acid level 11/23/2015  . Cocaine abuse (HCC)   . Concern about STD in male without diagnosis   . Paranoid ideation (HCC) 02/22/2015    No past surgical history on file.      Home Medications    Prior to Admission medications   Medication Sig Start Date End Date Taking? Authorizing Provider  acetaminophen (TYLENOL) 325 MG tablet Take 650 mg by mouth every 6 (six) hours as needed for moderate pain.    [provider]  nystatin (MYCOSTATIN/NYSTOP) powder Apply topically 4 (four) times daily. 04/21/17   Roxy Horseman, PA-C    Family History No family history on file.  Social History Social History   Tobacco Use  .  Smoking status: Current Every Day Smoker    Packs/day: 1.00    Years: 0.00    Pack years: 0.00    Types: Cigarettes, Cigars  . Smokeless tobacco: Never Used  Substance Use Topics  . Alcohol use: No    Frequency: Never    Comment: Previous history.   . Drug use: No    Comment: last use October 2017     Allergies   Haldol [haloperidol lactate]   Review of Systems Review of Systems  All other systems reviewed and are negative.    Physical Exam Updated Vital Signs BP 114/79 (BP Location: Right Wrist)   Pulse 75   Temp 98 F (36.7 C) (Oral)   Resp 18   SpO2 98%   Physical Exam  Constitutional: He is oriented to person, place, and time. He appears well-developed and well-nourished.  Non-toxic appearance. No distress.  HENT:  Head: Normocephalic and atraumatic.  Eyes: Pupils are equal, round, and reactive to light. Conjunctivae, EOM and lids are normal.  Neck: Normal range of motion. Neck supple. No tracheal deviation present. No thyroid mass present.  Cardiovascular: Normal rate, regular rhythm and normal heart sounds. Exam reveals no gallop.  No murmur heard. Pulmonary/Chest: Effort normal and breath sounds normal. No stridor. No respiratory distress. He has no decreased breath sounds. He has no wheezes. He has no rhonchi. He has no rales.  Abdominal: Soft.  Normal appearance and bowel sounds are normal. He exhibits no distension. There is no tenderness. There is no rebound and no CVA tenderness.  Musculoskeletal: Normal range of motion. He exhibits no edema or tenderness.  Neurological: He is alert and oriented to person, place, and time. He has normal strength. No cranial nerve deficit or sensory deficit. GCS eye subscore is 4. GCS verbal subscore is 5. GCS motor subscore is 6.  Skin: Skin is warm and dry. No abrasion and no rash noted.  Psychiatric: He has a normal mood and affect. His speech is normal and behavior is normal.  Nursing note and vitals reviewed.    ED  Treatments / Results  Labs (all labs ordered are listed, but only abnormal results are displayed) Labs Reviewed - No data to display  EKG None  Radiology No results found.  Procedures Procedures (including critical care time)  Medications Ordered in ED Medications  acetaminophen (TYLENOL) tablet 650 mg (has no administration in time range)     Initial Impression / Assessment and Plan / ED Course  I have reviewed the triage vital signs and the nursing notes.  Pertinent labs & imaging results that were available during my care of the patient were reviewed by me and considered in my medical decision making (see chart for details).     Patient here with chronic headaches.  Given Tylenol.  He is stable for discharge  Final Clinical Impressions(s) / ED Diagnoses   Final diagnoses:  Chronic intractable headache, unspecified headache type    ED Discharge Orders    None       Lorre NickAllen, Itzelle Gains, MD 04/29/17 [phone]

## 2017-04-29 NOTE — Discharge Instructions (Addendum)
You need to start following up with primary care.  You should be using the emergency room as a primary care.  Use Tylenol or ibuprofen as needed for pain.

## 2017-04-29 NOTE — ED Triage Notes (Signed)
Pt here for the second time today and states he wants to be seen for a headache

## 2017-04-29 NOTE — ED Provider Notes (Signed)
[facility]-EMERGENCY DEPT Provider Note   CSN: [id number] Arrival date & time: 04/28/17  2045     History   Chief Complaint Chief Complaint  Patient presents with  . Headache    HPI Dustin Norris is a 42 y.o. male presenting for evaluation of HA.  Pt states he has HA for the past several days. He has not taken anything for this. He has these HAs frequently, this feels no different than usual. He denies vision changes, slurred speech, weakness, CP, SOB, or neck pain. Pain is frontal and constant. Nothing makes it better or worse.   HPI  Past Medical History:  Diagnosis Date  . Anxiety   . Asthma   . Cocaine abuse (HCC)   . Homelessness   . Migraine   . Tobacco abuse     Patient Active Problem List   Diagnosis Date Noted  . 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 Cocaine use disorder, moderate, dependence (HCC) 07/26/2016  . 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 Psychosis (HCC) 07/24/2016  . Chest pain 11/23/2015  . Asthma 11/23/2015  . Tobacco abuse 11/23/2015  . Elevated lactic acid level 11/23/2015  . Cocaine abuse (HCC)   . Concern about STD in male without diagnosis   . Paranoid ideation (HCC) 02/22/2015    History reviewed. No pertinent surgical history.      Home Medications    Prior to Admission medications   Medication Sig Start Date End Date Taking? Authorizing Provider  acetaminophen (TYLENOL) 325 MG tablet Take 650 mg by mouth every 6 (six) hours as needed for moderate pain.    [provider]  nystatin (MYCOSTATIN/NYSTOP) powder Apply topically 4 (four) times daily. 04/21/17   Roxy Horseman, PA-C    Family History History reviewed. No pertinent family history.  Social History Social History   Tobacco Use  . Smoking status: Current Every Day Smoker    Packs/day: 1.00    Years: 0.00    Pack years: 0.00    Types: Cigarettes, Cigars  .  Smokeless tobacco: Never Used  Substance Use Topics  . Alcohol use: No    Frequency: Never    Comment: Previous history.   . Drug use: No    Comment: last use October 2017     Allergies   Haldol [haloperidol lactate]   Review of Systems Review of Systems  Constitutional: Negative for chills and fever.  HENT: Negative for congestion and sore throat.   Eyes: Negative for visual disturbance.  Respiratory: Negative for cough and shortness of breath.   Cardiovascular: Negative for chest pain.  Gastrointestinal: Negative for abdominal distention.  Musculoskeletal: Negative for back pain.  Neurological: Positive for headaches. Negative for dizziness, speech difficulty, weakness and numbness.  Hematological: Does not bruise/bleed easily.  Psychiatric/Behavioral: Negative for confusion.     Physical Exam Updated Vital Signs BP (!) 145/97 (BP Location: Right Arm)   Pulse 95   Temp 98.8 F (37.1 C) (Oral)   Resp 18   SpO2 99%   Physical Exam  Constitutional: He is oriented to person, place, and time. He appears well-developed and well-nourished. No distress.  HENT:  Head: Normocephalic and atraumatic.  Nose: Nose normal.  Mouth/Throat: Uvula is midline, oropharynx is clear and moist and mucous membranes are normal.  Eyes: Pupils are equal, round, and reactive to light. Conjunctivae and EOM are normal.  EOMI without pain  Neck: Normal range of motion.  Cardiovascular: Normal rate,  regular rhythm and intact distal pulses.  Pulmonary/Chest: Effort normal and breath sounds normal. No respiratory distress. He has no wheezes.  Abdominal: Soft. He exhibits no distension. There is no tenderness.  Musculoskeletal: Normal range of motion.  Neurological: He is alert and oriented to person, place, and time. He has normal strength. No sensory deficit. Coordination normal. GCS eye subscore is 4. GCS verbal subscore is 5. GCS motor subscore is 6.  Nose to finger intact.  Skin: Skin is  warm. No rash noted.  Psychiatric: He has a normal mood and affect.  Nursing note and vitals reviewed.    ED Treatments / Results  Labs (all labs ordered are listed, but only abnormal results are displayed) Labs Reviewed - No data to display  EKG None  Radiology No results found.  Procedures Procedures (including critical care time)  Medications Ordered in ED Medications - No data to display   Initial Impression / Assessment and Plan / ED Course  I have reviewed the triage vital signs and the nursing notes.  Pertinent labs & imaging results that were available during my care of the patient were reviewed by me and considered in my medical decision making (see chart for details).     Pt presenting for evaluation of HA. Has ah/o frequent HAs, this feels similar. No neurologic deficits. Will give ibuprofen and plan for d/c. Discussed with pt that this is not an appropriate use of the ER, and he should f/u with PCP. At this time, pt is safe for d/c. Return precautions given. Pt states he understands and agrees to plan.    Final Clinical Impressions(s) / ED Diagnoses   Final diagnoses:  Acute nonintractable headache, unspecified headache type    ED Discharge Orders    None       Alveria ApleyCaccavale, Vaneta Hammontree, PA-C 04/29/17 0136    Molpus, Jonny RuizJohn, MD 04/29/17 [phone]

## 2017-04-29 NOTE — ED Triage Notes (Signed)
Pt asleep in the lobby

## 2017-04-30 ENCOUNTER — Emergency Department (HOSPITAL_COMMUNITY)
Admission: EM | Admit: 2017-04-30 | Discharge: 2017-04-30 | Disposition: A | Discharge status: Home / Self Care | Payer: Self-pay | Attending: Emergency Medicine | Admitting: Emergency Medicine

## 2017-04-30 ENCOUNTER — Other Ambulatory Visit: Payer: Self-pay

## 2017-04-30 ENCOUNTER — Encounter (HOSPITAL_COMMUNITY): Payer: Self-pay | Admitting: Nurse Practitioner

## 2017-04-30 ENCOUNTER — Encounter (HOSPITAL_COMMUNITY): Payer: Self-pay | Admitting: Emergency Medicine

## 2017-04-30 DIAGNOSIS — R51 Headache: Secondary | ICD-10-CM

## 2017-04-30 DIAGNOSIS — R519 Headache, unspecified: Secondary | ICD-10-CM

## 2017-04-30 DIAGNOSIS — Z5321 Procedure and treatment not carried out due to patient leaving prior to being seen by health care provider: Secondary | ICD-10-CM | POA: Insufficient documentation

## 2017-04-30 NOTE — ED Provider Notes (Signed)
TIME SEEN: 5:27 AM  CHIEF COMPLAINT: Chronic headache  HPI: Patient is a 42 year old male with history of substance abuse, homelessness who presents to the emergency department frequently for chronic headaches.  Patient now reports that his headache is gone and he is ready to go home.  He states he is just feeling tired.  No numbness, tingling or focal weakness.  At this time he refuses examination and just wants to leave.  Patient has been here 106 times in the past 6 months.  ROS: See HPI Constitutional: no fever  Eyes: no drainage  ENT: no runny nose   Cardiovascular:  no chest pain  Resp: no SOB  GI: no vomiting GU: no dysuria Integumentary: no rash  Allergy: no hives  Musculoskeletal: no leg swelling  Neurological: no slurred speech ROS otherwise negative  PAST MEDICAL HISTORY/PAST SURGICAL HISTORY:  Past Medical History:  Diagnosis Date  . Anxiety   . Asthma   . Cocaine abuse (HCC)   . Homelessness   . Migraine   . Tobacco abuse     MEDICATIONS:  Prior to Admission medications   Medication Sig Start Date End Date Taking? Authorizing Provider  acetaminophen (TYLENOL) 325 MG tablet Take 650 mg by mouth every 6 (six) hours as needed for moderate pain.   Yes [provider]  nystatin (MYCOSTATIN/NYSTOP) powder Apply topically 4 (four) times daily. 04/21/17   Roxy Horseman, PA-C    ALLERGIES:  Allergies  Allergen Reactions  . Haldol [Haloperidol Lactate] Anaphylaxis    SOCIAL HISTORY:  Social History   Tobacco Use  . Smoking status: Current Every Day Smoker    Packs/day: 1.00    Years: 0.00    Pack years: 0.00    Types: Cigarettes, Cigars  . Smokeless tobacco: Never Used  Substance Use Topics  . Alcohol use: No    Frequency: Never    Comment: Previous history.     FAMILY HISTORY: History reviewed. No pertinent family history.  EXAM: BP 115/79 (BP Location: Right Arm)   Pulse 80   Temp 97.6 F (36.4 C) (Oral)   Resp 14   Ht 5\' 7"  (1.702  m)   Wt 72.6 kg (160 lb)   SpO2 98%   BMI 25.06 kg/m  CONSTITUTIONAL: Alert and oriented and responds appropriately to questions. Well-appearing; well-nourished HEAD: Normocephalic EYES: Conjunctivae clear ENT: normal nose; moist mucous membranes NECK: Supple, no meningismus CARD: RRR RESP: Normal chest excursion without splinting or tachypnea;  no hypoxia or respiratory distress, speaking full sentences  ABD/GI: Normal bowel sounds; non-distended BACK:  The back appears normal  EXT: Normal ROM in all joints SKIN: Normal color for age and race; warm; no rash NEURO: Moves all extremities equally, normal gait, normal speech PSYCH: The patient's mood and manner are appropriate. Grooming and personal hygiene are appropriate.  MEDICAL DECISION MAKING: Patient here complains of headache.  He now has no headache and no complaints.  No obvious focal neuro deficits.  He refuses examination and states that he would like to leave.  He is calm and appropriate.  He is asking for a bus pass.  I feel he is safe for discharge.  He refuses any medication at this time.  At this time, I do not feel there is any life-threatening condition present. I have reviewed and discussed all results (EKG, imaging, lab, urine as appropriate) and exam findings with patient/family. I have reviewed nursing notes and appropriate previous records.  I feel the patient is safe to be  discharged home without further emergent workup and can continue workup as an outpatient as needed. Discussed usual and customary return precautions. Patient/family verbalize understanding and are comfortable with this plan.  Outpatient follow-up has been provided if needed. All questions have been answered.      Dustin Norris, Layla MawKristen N, DO 04/30/17 [phone]

## 2017-04-30 NOTE — Discharge Instructions (Addendum)
You may alternate Tylenol 1000 mg every 6 hours as needed for pain and Ibuprofen 800 mg every 8 hours as needed for pain.  Please take Ibuprofen with food. ° ° ° °To find a primary care or specialty doctor please call [phone] or [phone] to access "[facility] Find a Doctor Service." ° °You may also go on the [facility] website at [email or web address] ° °There are also multiple [facility], [facility] practices throughout the [place] that are frequently accepting new patients. You may find a clinic that is close to your home and contact them. ° °[facility] -  °[address] °[place] [address] °[phone] ° ° °[facility] -  °[address] °[place] [address] [phone] ° ° °[facility] - °[address]  °[place] [address] °[phone] ° ° °

## 2017-04-30 NOTE — ED Triage Notes (Signed)
Pt is c/o 4/10 headache. Denies any associated symptoms.

## 2017-04-30 NOTE — ED Triage Notes (Signed)
Patient complaining of headache.

## 2017-05-01 ENCOUNTER — Encounter (HOSPITAL_COMMUNITY): Payer: Self-pay | Admitting: Nurse Practitioner

## 2017-05-01 ENCOUNTER — Encounter (HOSPITAL_COMMUNITY): Payer: Self-pay | Admitting: Emergency Medicine

## 2017-05-01 ENCOUNTER — Emergency Department (HOSPITAL_COMMUNITY)
Admission: EM | Admit: 2017-05-01 | Discharge: 2017-05-01 | Disposition: A | Discharge status: Home / Self Care | Payer: Self-pay | Attending: Emergency Medicine | Admitting: Emergency Medicine

## 2017-05-01 DIAGNOSIS — Z5321 Procedure and treatment not carried out due to patient leaving prior to being seen by health care provider: Secondary | ICD-10-CM | POA: Insufficient documentation

## 2017-05-01 DIAGNOSIS — F17210 Nicotine dependence, cigarettes, uncomplicated: Secondary | ICD-10-CM | POA: Insufficient documentation

## 2017-05-01 DIAGNOSIS — R519 Headache, unspecified: Secondary | ICD-10-CM

## 2017-05-01 DIAGNOSIS — R110 Nausea: Secondary | ICD-10-CM | POA: Insufficient documentation

## 2017-05-01 DIAGNOSIS — Z590 Homelessness: Secondary | ICD-10-CM | POA: Insufficient documentation

## 2017-05-01 DIAGNOSIS — R51 Headache: Secondary | ICD-10-CM | POA: Insufficient documentation

## 2017-05-01 DIAGNOSIS — J45909 Unspecified asthma, uncomplicated: Secondary | ICD-10-CM | POA: Insufficient documentation

## 2017-05-01 NOTE — ED Triage Notes (Signed)
Pt is c/o HA, stomach pain and nausea.

## 2017-05-01 NOTE — ED Notes (Addendum)
Pt already asleep in lobby.

## 2017-05-01 NOTE — ED Triage Notes (Signed)
Patient complains of headache.

## 2017-05-01 NOTE — ED Provider Notes (Signed)
[facility]-EMERGENCY DEPT Provider Note   CSN: [id number] Arrival date & time: 05/01/17  0224     History   Chief Complaint Chief Complaint  Patient presents with  . Headache    HPI Dustin Norris is a 42 y.o. male.  The history is provided by the patient and medical records.  Headache       42 year old male with history of anxiety, asthma, cocaine abuse, homelessness, presenting to the ED with headache.  This is his 108th ED visit in the past 6 months for similar related complaints.  States he has generalized headache and his eyes feel "tired".  He is requesting a Sprite to drink.  Past Medical History:  Diagnosis Date  . Anxiety   . Asthma   . Cocaine abuse (HCC)   . Homelessness   . Migraine   . Tobacco abuse     Patient Active Problem List   Diagnosis Date Noted  . 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 Cocaine use disorder, moderate, dependence (HCC) 07/26/2016  . 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 Psychosis (HCC) 07/24/2016  . Chest pain 11/23/2015  . Asthma 11/23/2015  . Tobacco abuse 11/23/2015  . Elevated lactic acid level 11/23/2015  . Cocaine abuse (HCC)   . Concern about STD in male without diagnosis   . Paranoid ideation (HCC) 02/22/2015    History reviewed. No pertinent surgical history.      Home Medications    Prior to Admission medications   Medication Sig Start Date End Date Taking? Authorizing Provider  acetaminophen (TYLENOL) 325 MG tablet Take 650 mg by mouth every 6 (six) hours as needed for moderate pain.   Yes [provider]  nystatin (MYCOSTATIN/NYSTOP) powder Apply topically 4 (four) times daily. 04/21/17   Roxy HorsemanBrowning, Robert, PA-C    Family History No family history on file.  Social History Social History   Tobacco Use  . Smoking status: Current Every Day Smoker    Packs/day: 1.00    Years: 0.00    Pack years: 0.00   Types: Cigarettes, Cigars  . Smokeless tobacco: Never Used  Substance Use Topics  . Alcohol use: No    Frequency: Never    Comment: Previous history.   . Drug use: No    Comment: last use October 2017     Allergies   Haldol [haloperidol lactate]   Review of Systems Review of Systems  Neurological: Positive for headaches.  All other systems reviewed and are negative.    Physical Exam Updated Vital Signs BP (!) 146/79   Pulse 76   Temp 98 F (36.7 C) (Oral)   Resp 18   SpO2 100%   Physical Exam  Constitutional: He is oriented to person, place, and time. He appears well-developed and well-nourished. No distress.  HENT:  Head: Normocephalic and atraumatic.  Right Ear: External ear normal.  Left Ear: External ear normal.  Eyes: Pupils are equal, round, and reactive to light. Conjunctivae and EOM are normal.  Neck: Normal range of motion and full passive range of motion without pain. Neck supple. No neck rigidity.  No rigidity, no meningismus  Cardiovascular: Normal rate, regular rhythm and normal heart sounds.  No murmur heard. Pulmonary/Chest: Effort normal and breath sounds normal. No respiratory distress. He has no wheezes. He has no rhonchi.  Abdominal: Soft. Bowel sounds are normal. There is no tenderness. There is no guarding.  Musculoskeletal: Normal range of motion. He exhibits no  edema.  Neurological: He is alert and oriented to person, place, and time. He has normal strength. He displays no tremor. No cranial nerve deficit or sensory deficit. He displays no seizure activity.  AAOx3, answering questions and following commands appropriately; equal strength UE and LE bilaterally; CN grossly intact; moves all extremities appropriately without ataxia; no focal neuro deficits or facial asymmetry appreciated  Skin: Skin is warm and dry. No rash noted. He is not diaphoretic.  Psychiatric: He has a normal mood and affect. His behavior is normal. Thought content normal.    Nursing note and vitals reviewed.    ED Treatments / Results  Labs (all labs ordered are listed, but only abnormal results are displayed) Labs Reviewed - No data to display  EKG None  Radiology No results found.  Procedures Procedures (including critical care time)  Medications Ordered in ED Medications - No data to display   Initial Impression / Assessment and Plan / ED Course  I have reviewed the triage vital signs and the nursing notes.  Pertinent labs & imaging results that were available during my care of the patient were reviewed by me and considered in my medical decision making (see chart for details).  42 year old male here with headache.  This is his 108th ED visit for similar related complaints over the past 6 months.  His neurologic exam is nonfocal.  I highly doubt any acute intracranial emergency presebt.  He will be discharged home.  Final Clinical Impressions(s) / ED Diagnoses   Final diagnoses:  Nonintractable headache, unspecified chronicity pattern, unspecified headache type    ED Discharge Orders    None       [name], PA-C 05/01/17 1610    Paula Libra, MD 05/01/17 [phone]

## 2017-05-02 ENCOUNTER — Emergency Department (HOSPITAL_COMMUNITY)
Admission: EM | Admit: 2017-05-02 | Discharge: 2017-05-02 | Disposition: A | Discharge status: Home / Self Care | Payer: Self-pay | Attending: Emergency Medicine | Admitting: Emergency Medicine

## 2017-05-02 NOTE — ED Notes (Signed)
[facility] and walked back out less than 10 minutes later

## 2017-05-05 ENCOUNTER — Encounter (HOSPITAL_COMMUNITY): Payer: Self-pay | Admitting: Emergency Medicine

## 2017-05-05 DIAGNOSIS — R51 Headache: Secondary | ICD-10-CM | POA: Insufficient documentation

## 2017-05-05 DIAGNOSIS — Z5321 Procedure and treatment not carried out due to patient leaving prior to being seen by health care provider: Secondary | ICD-10-CM | POA: Insufficient documentation

## 2017-05-05 NOTE — ED Triage Notes (Addendum)
Pt from home via EMS with c/o migraine that began 2 days ago. Vital signs stable. No neuro deficits.      90HR, RR 18

## 2017-05-06 ENCOUNTER — Emergency Department (HOSPITAL_COMMUNITY)
Admission: EM | Admit: 2017-05-06 | Discharge: 2017-05-06 | Disposition: A | Discharge status: Home / Self Care | Payer: Self-pay | Attending: Emergency Medicine | Admitting: Emergency Medicine

## 2017-05-06 DIAGNOSIS — R51 Headache: Secondary | ICD-10-CM

## 2017-05-06 DIAGNOSIS — F17210 Nicotine dependence, cigarettes, uncomplicated: Secondary | ICD-10-CM | POA: Insufficient documentation

## 2017-05-06 DIAGNOSIS — Z590 Homelessness: Secondary | ICD-10-CM | POA: Insufficient documentation

## 2017-05-06 DIAGNOSIS — R519 Headache, unspecified: Secondary | ICD-10-CM

## 2017-05-06 DIAGNOSIS — B353 Tinea pedis: Secondary | ICD-10-CM | POA: Insufficient documentation

## 2017-05-06 MED ORDER — ACETAMINOPHEN 500 MG PO TABS
1000.0000 mg | ORAL_TABLET | Freq: Once | ORAL | Status: AC
  Administered 2017-05-06: 1000 mg via ORAL
  Filled 2017-05-06: qty 2

## 2017-05-07 ENCOUNTER — Emergency Department (HOSPITAL_COMMUNITY): Admission: EM | Admit: 2017-05-07 | Discharge: 2017-05-07 | Discharge status: Absent without leave | Payer: Self-pay

## 2017-05-07 ENCOUNTER — Encounter (HOSPITAL_COMMUNITY): Payer: Self-pay

## 2017-05-07 ENCOUNTER — Emergency Department (HOSPITAL_COMMUNITY)
Admission: EM | Admit: 2017-05-07 | Discharge: 2017-05-08 | Disposition: A | Discharge status: Home / Self Care | Payer: Self-pay | Attending: Emergency Medicine | Admitting: Emergency Medicine

## 2017-05-07 ENCOUNTER — Emergency Department (HOSPITAL_COMMUNITY)
Admission: EM | Admit: 2017-05-07 | Discharge: 2017-05-07 | Disposition: A | Discharge status: Home / Self Care | Payer: Self-pay

## 2017-05-07 ENCOUNTER — Encounter (HOSPITAL_COMMUNITY): Payer: Self-pay | Admitting: Emergency Medicine

## 2017-05-07 ENCOUNTER — Emergency Department (HOSPITAL_COMMUNITY)
Admission: EM | Admit: 2017-05-07 | Discharge: 2017-05-07 | Disposition: A | Discharge status: Home / Self Care | Payer: Self-pay | Attending: Emergency Medicine | Admitting: Emergency Medicine

## 2017-05-07 ENCOUNTER — Other Ambulatory Visit: Payer: Self-pay

## 2017-05-07 DIAGNOSIS — Z5900 Homelessness unspecified: Secondary | ICD-10-CM

## 2017-05-07 DIAGNOSIS — B353 Tinea pedis: Secondary | ICD-10-CM | POA: Insufficient documentation

## 2017-05-07 DIAGNOSIS — F17210 Nicotine dependence, cigarettes, uncomplicated: Secondary | ICD-10-CM | POA: Insufficient documentation

## 2017-05-07 DIAGNOSIS — R51 Headache: Secondary | ICD-10-CM | POA: Insufficient documentation

## 2017-05-07 DIAGNOSIS — R519 Headache, unspecified: Secondary | ICD-10-CM

## 2017-05-07 DIAGNOSIS — Z79899 Other long term (current) drug therapy: Secondary | ICD-10-CM | POA: Insufficient documentation

## 2017-05-07 DIAGNOSIS — G8929 Other chronic pain: Secondary | ICD-10-CM

## 2017-05-07 DIAGNOSIS — J45909 Unspecified asthma, uncomplicated: Secondary | ICD-10-CM | POA: Insufficient documentation

## 2017-05-07 MED ORDER — ACETAMINOPHEN 500 MG PO TABS
1000.0000 mg | ORAL_TABLET | Freq: Once | ORAL | Status: AC
  Administered 2017-05-07: 1000 mg via ORAL
  Filled 2017-05-07: qty 2

## 2017-05-07 MED ORDER — TERBINAFINE HCL 1 % EX CREA: 1.0000 "application " | TOPICAL_CREAM | Freq: Two times a day (BID) | CUTANEOUS | 0 refills | Status: DC

## 2017-05-07 NOTE — ED Triage Notes (Signed)
[place] Doesn't "feel good" after eating [employer] chicken sandwich.  Seen at [facility] tonight for a different complaint.

## 2017-05-07 NOTE — ED Provider Notes (Signed)
TIME SEEN: 4:23 AM  CHIEF COMPLAINT: Chronic headache, right foot pain  HPI: Patient is a 42 year old male with history of homelessness, substance abuse, chronic headaches who is well-known to this emergency department with 111 visits in the past 6 months who presents to the emergency department with complaints of his typical chronic headache.  No head injury or focal neurologic deficits.  No fever.  Has blisters to the right feet and states he feels like he has athlete's foot and needs a cream for this.  ROS: See HPI Constitutional: no fever  Eyes: no drainage  ENT: no runny nose   Cardiovascular:  no chest pain  Resp: no SOB  GI: no vomiting GU: no dysuria Integumentary: no rash  Allergy: no hives  Musculoskeletal: no leg swelling  Neurological: no slurred speech ROS otherwise negative  PAST MEDICAL HISTORY/PAST SURGICAL HISTORY:  Past Medical History:  Diagnosis Date  . Anxiety   . Asthma   . Cocaine abuse (HCC)   . Homelessness   . Migraine   . Tobacco abuse     MEDICATIONS:  Prior to Admission medications   Medication Sig Start Date End Date Taking? Authorizing Provider  acetaminophen (TYLENOL) 325 MG tablet Take 650 mg by mouth every 6 (six) hours as needed for moderate pain.   Yes [provider]  nystatin (MYCOSTATIN/NYSTOP) powder Apply topically 4 (four) times daily. 04/21/17   Roxy Horseman, PA-C    ALLERGIES:  Allergies  Allergen Reactions  . Haldol [Haloperidol Lactate] Anaphylaxis    SOCIAL HISTORY:  Social History   Tobacco Use  . Smoking status: Current Every Day Smoker    Packs/day: 1.00    Years: 0.00    Pack years: 0.00    Types: Cigarettes, Cigars  . Smokeless tobacco: Never Used  Substance Use Topics  . Alcohol use: No    Frequency: Never    Comment: Previous history.     FAMILY HISTORY: History reviewed. No pertinent family history.  EXAM: BP (!) 138/93 (BP Location: Left Arm)   Pulse 97   Temp 98.9 F (37.2 C) (Oral)    Resp 15   Ht 5\' 7"  (1.702 m)   Wt 72.6 kg (160 lb)   SpO2 99%   BMI 25.06 kg/m  CONSTITUTIONAL: Alert and oriented and responds appropriately to questions. Well-appearing; well-nourished HEAD: Normocephalic EYES: Conjunctivae clear, pupils appear equal, EOMI ENT: normal nose; moist mucous membranes NECK: Supple, no meningismus, no nuchal rigidity, no LAD  CARD: RRR; S1 and S2 appreciated; no murmurs, no clicks, no rubs, no gallops RESP: Normal chest excursion without splinting or tachypnea; breath sounds clear and equal bilaterally; no wheezes, no rhonchi, no rales, no hypoxia or respiratory distress, speaking full sentences ABD/GI: Normal bowel sounds; non-distended; soft, non-tender, no rebound, no guarding, no peritoneal signs, no hepatosplenomegaly BACK:  The back appears normal and is non-tender to palpation, there is no CVA tenderness EXT: Normal ROM in all joints; non-tender to palpation; no edema; normal capillary refill; no cyanosis, no calf tenderness or swelling; 2+ DP pulses bilaterally, no bony tenderness on exam, normal gait, some dry skin and flaking of the soles of feet without redness or warmth or induration    SKIN: Normal color for age and race; warm; no rash NEURO: Moves all extremities equally, normal sensation diffusely, cranial nerves II through XII intact, normal gait, normal speech PSYCH: The patient's mood and manner are appropriate. Grooming and personal hygiene are appropriate.  MEDICAL DECISION MAKING: Patient here with chronic  headache.  No focal neurologic deficits.  No fever.  No meningismus.  No head injury.  Will give Tylenol.  Also complaining of blisters to his feet.  There are no active blisters but it looks like he has tinea pedis.  He is requesting a cream.  Will discharge with prescription of Lamisil.  He is asked rest of bright advised him with the cream from the ED but we have discussed that he will have to have this done as an outpatient.  At this  time, I do not feel there is any life-threatening condition present. I have reviewed and discussed all results (EKG, imaging, lab, urine as appropriate) and exam findings with patient/family. I have reviewed nursing notes and appropriate previous records.  I feel the patient is safe to be discharged home without further emergent workup and can continue workup as an outpatient as needed. Discussed usual and customary return precautions. Patient/family verbalize understanding and are comfortable with this plan.  Outpatient follow-up has been provided if needed. All questions have been answered.      Moksha Dorgan, Layla MawKristen N, DO 05/07/17 [phone]

## 2017-05-07 NOTE — Discharge Instructions (Signed)
You may alternate Tylenol 1000 mg every 6 hours as needed for pain and Ibuprofen 800 mg every 8 hours as needed for pain.  Please take Ibuprofen with food. ° °

## 2017-05-07 NOTE — ED Triage Notes (Addendum)
Pt reports bilateral foot pain and blisters. Ambulatory. A&Ox4.

## 2017-05-07 NOTE — ED Triage Notes (Signed)
Patient c/o migraine and athlete's foot bilaterally.

## 2017-05-07 NOTE — ED Notes (Signed)
Pt reports that he wants to wait in the "day room," referring to the lobby. He states that it is too hot back here.

## 2017-05-07 NOTE — ED Notes (Signed)
Bed: [facility]PT4 Expected date:  Expected time:  Means of arrival:  Comments: 

## 2017-05-07 NOTE — ED Provider Notes (Signed)
[facility]-EMERGENCY DEPT Provider Note   CSN: [id number] Arrival date & time: 05/07/17  1819     History   Chief Complaint Chief Complaint  Patient presents with  . Migraine  . Tinea Pedis    HPI Dustin Norris is a 42 y.o. male.  HPI  42 year old male with a history of cocaine abuse, homelessness, chronic headache presents complaining of right foot pain.  He was seen here earlier this morning for the same.  This is his 112 visit in the last 6 months.  He states that his right foot is burning and still hurting.  It has been this way for a couple days.  He was prescribed Lamisil this morning but cannot fill it for another couple days when he gets paid.  When he checked in he noted a headache but when asked he states his headache is minimal and is not currently concerned about headache and does not want this checked out.  He has no left foot symptoms.  No drainage of his foot.  Past Medical History:  Diagnosis Date  . Anxiety   . Asthma   . Cocaine abuse (HCC)   . Homelessness   . Migraine   . Tobacco abuse     Patient Active Problem List   Diagnosis Date Noted  . 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 Cocaine use disorder, moderate, dependence (HCC) 07/26/2016  . 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 Psychosis (HCC) 07/24/2016  . Chest pain 11/23/2015  . Asthma 11/23/2015  . Tobacco abuse 11/23/2015  . Elevated lactic acid level 11/23/2015  . Cocaine abuse (HCC)   . Concern about STD in male without diagnosis   . Paranoid ideation (HCC) 02/22/2015    History reviewed. No pertinent surgical history.      Home Medications    Prior to Admission medications   Medication Sig Start Date End Date Taking? Authorizing Provider  acetaminophen (TYLENOL) 325 MG tablet Take 650 mg by mouth every 6 (six) hours as needed for moderate pain.    [provider]  nystatin  (MYCOSTATIN/NYSTOP) powder Apply topically 4 (four) times daily. 04/21/17   Roxy HorsemanBrowning, Robert, PA-C  terbinafine (LAMISIL AT) 1 % cream Apply 1 application topically 2 (two) times daily. 05/07/17   Ward, Layla MawKristen N, DO    Family History History reviewed. No pertinent family history.  Social History Social History   Tobacco Use  . Smoking status: Current Every Day Smoker    Packs/day: 1.00    Years: 0.00    Pack years: 0.00    Types: Cigarettes, Cigars  . Smokeless tobacco: Never Used  Substance Use Topics  . Alcohol use: No    Frequency: Never    Comment: Previous history.   . Drug use: No    Comment: last use October 2017     Allergies   Haldol [haloperidol lactate]   Review of Systems Review of Systems  Musculoskeletal: Positive for arthralgias.  Skin: Positive for color change.  Neurological: Positive for headaches.  All other systems reviewed and are negative.    Physical Exam Updated Vital Signs BP 132/84 (BP Location: Right Arm)   Pulse (!) 102   Temp 99.1 F (37.3 C) (Oral)   Ht 5\' 7"  (1.702 m)   Wt 72.6 kg (160 lb)   SpO2 98%   BMI 25.06 kg/m   Physical Exam  Constitutional: He appears well-developed and well-nourished. No distress.  HENT:  Head: Normocephalic  and atraumatic.  Right Ear: External ear normal.  Left Ear: External ear normal.  Nose: Nose normal.  Eyes: Right eye exhibits no discharge. Left eye exhibits no discharge.  Pulmonary/Chest: Effort normal.  Abdominal: He exhibits no distension.  Musculoskeletal: He exhibits no edema.  Right foot with mild erythema in patches, mostly near toes. Toenails have obvious fungal infections under toenails. No swelling to foot. Intact DP pulse. Normal sensation.  Neurological: He is alert.  Skin: Skin is warm and dry. He is not diaphoretic.  Nursing note and vitals reviewed.    ED Treatments / Results  Labs (all labs ordered are listed, but only abnormal results are displayed) Labs Reviewed - No  data to display  EKG None  Radiology No results found.  Procedures Procedures (including critical care time)  Medications Ordered in ED Medications - No data to display   Initial Impression / Assessment and Plan / ED Course  I have reviewed the triage vital signs and the nursing notes.  Pertinent labs & imaging results that were available during my care of the patient were reviewed by me and considered in my medical decision making (see chart for details).     Patient instructed that without using the cream, his foot will not get better.  Otherwise he has no other significant complaints.  He has mild tachycardia but otherwise vital signs are stable and have very low suspicion of an acute emergent condition.  We discussed that he also does not need a prescription for this medication and can discuss straight to the drug store.  Discharge in good condition.  Final Clinical Impressions(s) / ED Diagnoses   Final diagnoses:  Tinea pedis of right foot    ED Discharge Orders    None       Pricilla Loveless, MD 05/07/17 1901

## 2017-05-08 MED ORDER — ACETAMINOPHEN 325 MG PO TABS: 650.0000 mg | ORAL_TABLET | Freq: Once | ORAL | Status: DC

## 2017-05-08 NOTE — ED Notes (Signed)
Pt standing at door states, I'm ready to go.  Refused Tylenol and to sign discharge instructions.

## 2017-05-08 NOTE — Discharge Instructions (Addendum)
You cannot keep on coming to the ER for nonemergent causes. Follow-up with primary care as needed.

## 2017-05-08 NOTE — ED Provider Notes (Signed)
[facility] EMERGENCY DEPARTMENT Provider Note   CSN: [id number] Arrival date & time: 05/07/17  2311     History   Chief Complaint Chief Complaint  Patient presents with  . Fatigue    HPI Dustin Norris is a 42 y.o. male presenting for evaluation of "not feeling good."  Pt is a frequent visitor to the ER, with 113 visits in 6 months. Presents today due to "not feeling well." when asked, he states he has a headache. No other complaint. Pt states he is here because he is homeless. HA feels the same as all of his headaches, and he would just like something to drink. No neuro sxs.   HPI  Past Medical History:  Diagnosis Date  . Anxiety   . Asthma   . Cocaine abuse (HCC)   . Homelessness   . Migraine   . Tobacco abuse     Patient Active Problem List   Diagnosis Date Noted  . 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 Cocaine use disorder, moderate, dependence (HCC) 07/26/2016  . 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 Psychosis (HCC) 07/24/2016  . Chest pain 11/23/2015  . Asthma 11/23/2015  . Tobacco abuse 11/23/2015  . Elevated lactic acid level 11/23/2015  . Cocaine abuse (HCC)   . Concern about STD in male without diagnosis   . Paranoid ideation (HCC) 02/22/2015    History reviewed. No pertinent surgical history.      Home Medications    Prior to Admission medications   Medication Sig Start Date End Date Taking? Authorizing Provider  acetaminophen (TYLENOL) 325 MG tablet Take 650 mg by mouth every 6 (six) hours as needed for moderate pain.    [provider]  nystatin (MYCOSTATIN/NYSTOP) powder Apply topically 4 (four) times daily. 04/21/17   Roxy Horseman, PA-C  terbinafine (LAMISIL AT) 1 % cream Apply 1 application topically 2 (two) times daily. 05/07/17   Ward, Layla Maw, DO    Family History No family history on file.  Social History Social History   Tobacco Use   . Smoking status: Current Every Day Smoker    Packs/day: 1.00    Years: 0.00    Pack years: 0.00    Types: Cigarettes, Cigars  . Smokeless tobacco: Never Used  Substance Use Topics  . Alcohol use: No    Frequency: Never    Comment: Previous history.   . Drug use: No    Comment: last use October 2017     Allergies   Haldol [haloperidol lactate]   Review of Systems Review of Systems  Constitutional: Negative for chills and fever.  Neurological: Positive for headaches.     Physical Exam Updated Vital Signs BP (!) 135/94 (BP Location: Right Arm)   Pulse 90   Temp 100 F (37.8 C) (Oral)   Resp 14   SpO2 100%   Physical Exam  Constitutional: He is oriented to person, place, and time. He appears well-developed and well-nourished. No distress.  HENT:  Head: Normocephalic and atraumatic.  Eyes: Pupils are equal, round, and reactive to light. Conjunctivae and EOM are normal.  Neck: Normal range of motion.  Cardiovascular: Normal rate, regular rhythm and intact distal pulses.  Pulmonary/Chest: Effort normal and breath sounds normal. No respiratory distress. He has no wheezes.  Abdominal: Soft. He exhibits no distension. There is no tenderness. There is no guarding.  Musculoskeletal: Normal range of motion.  Neurological: He is alert and oriented to person,  place, and time.  Skin: Skin is warm. No rash noted.  Psychiatric: He has a normal mood and affect.  Nursing note and vitals reviewed.    ED Treatments / Results  Labs (all labs ordered are listed, but only abnormal results are displayed) Labs Reviewed - No data to display  EKG None  Radiology No results found.  Procedures Procedures (including critical care time)  Medications Ordered in ED Medications  acetaminophen (TYLENOL) tablet 650 mg (has no administration in time range)     Initial Impression / Assessment and Plan / ED Course  I have reviewed the triage vital signs and the nursing  notes.  Pertinent labs & imaging results that were available during my care of the patient were reviewed by me and considered in my medical decision making (see chart for details).      Pt is a frequent visitor of the ER, often with c/o HA. No changes in his HA today. requesting something to drink. Discussed with pt that he cannot continue to come to the ER for non emergent causes. Will d/c pt, as there is no acute medical emergency that I can determine at this time. Pt states he understands.    Final Clinical Impressions(s) / ED Diagnoses   Final diagnoses:  Homelessness  Acute nonintractable headache, unspecified headache type    ED Discharge Orders    None       Alveria ApleyCaccavale, Talissa Apple, PA-C 05/08/17 0011    Zadie RhineWickline, Donald, MD 05/08/17 [phone]

## 2017-05-10 DIAGNOSIS — F17210 Nicotine dependence, cigarettes, uncomplicated: Secondary | ICD-10-CM | POA: Insufficient documentation

## 2017-05-10 DIAGNOSIS — Z590 Homelessness: Secondary | ICD-10-CM | POA: Insufficient documentation

## 2017-05-10 DIAGNOSIS — Z765 Malingerer [conscious simulation]: Secondary | ICD-10-CM | POA: Insufficient documentation

## 2017-05-11 ENCOUNTER — Encounter (HOSPITAL_COMMUNITY): Payer: Self-pay

## 2017-05-11 ENCOUNTER — Encounter (HOSPITAL_COMMUNITY): Payer: Self-pay | Admitting: Family Medicine

## 2017-05-11 ENCOUNTER — Emergency Department (HOSPITAL_COMMUNITY)
Admission: EM | Admit: 2017-05-11 | Discharge: 2017-05-11 | Disposition: A | Discharge status: Home / Self Care | Payer: Self-pay | Attending: Emergency Medicine | Admitting: Emergency Medicine

## 2017-05-11 DIAGNOSIS — Z765 Malingerer [conscious simulation]: Secondary | ICD-10-CM

## 2017-05-11 DIAGNOSIS — Z590 Homelessness: Secondary | ICD-10-CM | POA: Insufficient documentation

## 2017-05-11 DIAGNOSIS — F1410 Cocaine abuse, uncomplicated: Secondary | ICD-10-CM | POA: Insufficient documentation

## 2017-05-11 DIAGNOSIS — G43709 Chronic migraine without aura, not intractable, without status migrainosus: Secondary | ICD-10-CM | POA: Insufficient documentation

## 2017-05-11 DIAGNOSIS — F419 Anxiety disorder, unspecified: Secondary | ICD-10-CM | POA: Insufficient documentation

## 2017-05-11 DIAGNOSIS — F17210 Nicotine dependence, cigarettes, uncomplicated: Secondary | ICD-10-CM | POA: Insufficient documentation

## 2017-05-11 DIAGNOSIS — J45909 Unspecified asthma, uncomplicated: Secondary | ICD-10-CM | POA: Insufficient documentation

## 2017-05-11 MED ORDER — ACETAMINOPHEN 500 MG PO TABS
1000.0000 mg | ORAL_TABLET | Freq: Once | ORAL | Status: AC
  Administered 2017-05-11: 1000 mg via ORAL
  Filled 2017-05-11: qty 2
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
## 2017-05-11 NOTE — ED Notes (Signed)
Bed: WTR5 Expected date:  Expected time:  Means of arrival:  Comments: 

## 2017-05-11 NOTE — ED Triage Notes (Signed)
Pt wants more foot cream for his blisters

## 2017-05-11 NOTE — ED Notes (Signed)
Pt called to be triaged x2 with no response.  RN notified. 

## 2017-05-11 NOTE — ED Triage Notes (Signed)
Patient was picked up from the [facility] on [address] [facility]. Patient is complaining of anxiety and out of medications. When patient was walking up to triage, he was on his cell phone.

## 2017-05-11 NOTE — ED Provider Notes (Signed)
   [facility]-EMERGENCY DEPT Provider Note: Lowella DellJ. Lane Emilia Kayes, MD, FACEP  CSN: [id number] MRN: [id number] ARRIVAL: 05/10/17 at 2342 ROOM: WA11/WA11   CHIEF COMPLAINT  Foot Pain   HISTORY OF PRESENT ILLNESS  05/11/17 5:47 AM Dustin Norris is a 42 y.o. male who is homeless and has 114 visits to the emergency department in the past 6 months for various minor complaints.  He has recently been seen several times for tinea pedis.  He was advised to purchase over-the-counter Lamisil.  He states he has been using it with improvement.  Pain is now minimal.  He is requesting Tylenol.   Past Medical History:  Diagnosis Date  . Anxiety   . Asthma   . Cocaine abuse (HCC)   . Homelessness   . Migraine   . Tobacco abuse     History reviewed. No pertinent surgical history.  History reviewed. No pertinent family history.  Social History   Tobacco Use  . Smoking status: Current Every Day Smoker    Packs/day: 1.00    Years: 0.00    Pack years: 0.00    Types: Cigarettes, Cigars  . Smokeless tobacco: Never Used  Substance Use Topics  . Alcohol use: No    Frequency: Never    Comment: Previous history.   . Drug use: No    Comment: last use October 2017    Prior to Admission medications   Medication Sig Start Date End Date Taking? Authorizing Provider  acetaminophen (TYLENOL) 325 MG tablet Take 650 mg by mouth every 6 (six) hours as needed for moderate pain.   Yes [provider]  nystatin (MYCOSTATIN/NYSTOP) powder Apply topically 4 (four) times daily. 04/21/17   Roxy HorsemanBrowning, Robert, PA-C  terbinafine (LAMISIL AT) 1 % cream Apply 1 application topically 2 (two) times daily. 05/07/17   Ward, Layla MawKristen N, DO    Allergies Haldol [haloperidol lactate]   REVIEW OF SYSTEMS  Negative except as noted here or in the History of Present Illness.   PHYSICAL EXAMINATION  Initial Vital Signs Blood pressure 120/88, pulse 88, temperature 98.5 F (36.9 C), resp. rate 12, SpO2 98  %.  Examination General: Well-developed, well-nourished male in no acute distress; appearance consistent with age of record HENT: normocephalic; atraumatic Eyes: Normal appearance Neck: supple Heart: regular rate and rhythm Lungs: Normal respiratory effort and excursion Abdomen: soft; nondistended Extremities: No deformity; full range of motion; patient declined to remove shoes Neurologic: Awake, alert and oriented; motor function intact in all extremities and symmetric; no facial droop Skin: Warm and dry Psychiatric: Flat affect   RESULTS  Summary of this visit's results, reviewed by myself:   EKG Interpretation  Date/Time:    Ventricular Rate:    PR Interval:    QRS Duration:   QT Interval:    QTC Calculation:   R Axis:     Text Interpretation:        Laboratory Studies: No results found for this or any previous visit (from the past 24 hour(s)). Imaging Studies: No results found.  ED COURSE  Nursing notes and initial vitals signs, including pulse oximetry, reviewed.  Vitals:   05/11/17 0223 05/11/17 0527  BP: (!) 155/97 120/88  Pulse: 85 88  Resp: 18 12  Temp: 98.5 F (36.9 C)   SpO2: 100% 98%    PROCEDURES    ED DIAGNOSES     ICD-10-CM   1. Malingering Z76.5        Shavonta Gossen, MD 05/11/17 [phone]

## 2017-05-12 DIAGNOSIS — Z590 Homelessness: Secondary | ICD-10-CM | POA: Insufficient documentation

## 2017-05-12 DIAGNOSIS — G43809 Other migraine, not intractable, without status migrainosus: Secondary | ICD-10-CM | POA: Insufficient documentation

## 2017-05-12 DIAGNOSIS — F17210 Nicotine dependence, cigarettes, uncomplicated: Secondary | ICD-10-CM | POA: Insufficient documentation

## 2017-05-13 ENCOUNTER — Encounter (HOSPITAL_COMMUNITY): Payer: Self-pay

## 2017-05-13 ENCOUNTER — Other Ambulatory Visit: Payer: Self-pay

## 2017-05-13 ENCOUNTER — Encounter (HOSPITAL_COMMUNITY): Payer: Self-pay | Admitting: Emergency Medicine

## 2017-05-13 ENCOUNTER — Emergency Department (HOSPITAL_COMMUNITY)
Admission: EM | Admit: 2017-05-13 | Discharge: 2017-05-13 | Disposition: A | Discharge status: Home / Self Care | Payer: Self-pay | Attending: Emergency Medicine | Admitting: Emergency Medicine

## 2017-05-13 DIAGNOSIS — G43809 Other migraine, not intractable, without status migrainosus: Secondary | ICD-10-CM

## 2017-05-13 DIAGNOSIS — Z5900 Homelessness unspecified: Secondary | ICD-10-CM

## 2017-05-13 DIAGNOSIS — J45909 Unspecified asthma, uncomplicated: Secondary | ICD-10-CM | POA: Insufficient documentation

## 2017-05-13 DIAGNOSIS — F17210 Nicotine dependence, cigarettes, uncomplicated: Secondary | ICD-10-CM | POA: Insufficient documentation

## 2017-05-13 DIAGNOSIS — R51 Headache: Secondary | ICD-10-CM | POA: Insufficient documentation

## 2017-05-13 DIAGNOSIS — R519 Headache, unspecified: Secondary | ICD-10-CM

## 2017-05-13 MED ORDER — ACETAMINOPHEN 325 MG PO TABS
650.0000 mg | ORAL_TABLET | Freq: Once | ORAL | Status: AC
  Administered 2017-05-13: 650 mg via ORAL
  Filled 2017-05-13: qty 2

## 2017-05-13 MED ORDER — ONDANSETRON 4 MG PO TBDP
4.0000 mg | ORAL_TABLET | Freq: Once | ORAL | Status: AC
  Administered 2017-05-13: 4 mg via ORAL
  Filled 2017-05-13: qty 1

## 2017-05-13 MED ORDER — TRAMADOL HCL 50 MG PO TABS
50.0000 mg | ORAL_TABLET | Freq: Once | ORAL | Status: AC
  Administered 2017-05-13: 50 mg via ORAL
  Filled 2017-05-13: qty 1

## 2017-05-13 MED ORDER — NAPROXEN 500 MG PO TABS: 500.0000 mg | ORAL_TABLET | Freq: Two times a day (BID) | ORAL | 0 refills | Status: DC

## 2017-05-13 NOTE — ED Notes (Signed)
Patient unable to sign signature pad.  

## 2017-05-13 NOTE — Discharge Instructions (Signed)
Naproxen for headaches. Follow up with your primary care physician

## 2017-05-13 NOTE — ED Triage Notes (Signed)
Pt here for migraine. Pt has left lobby multiple times and walked back in causing delay in triage. No neuro deficits. Pt rates migraine 10/10 but states it is no different from his "normal migraine" that he is seen here for.

## 2017-05-13 NOTE — ED Provider Notes (Signed)
[facility]-EMERGENCY DEPT Provider Note   CSN: [id number] Arrival date & time: 05/13/17  2047     History   Chief Complaint Chief Complaint  Patient presents with  . Migraine    HPI Dustin Norris is a 42 y.o. male With history of homelessness, chronic migraines, Anxiety, cocaine abuse, and asthmaPresents for evaluation of migraine headache.He was seen and evaluated for the same complaint 13 hours ago.  He declined any treatment at that time but today tells me he wants Tylenol.  He states this headache is the same as his usual headaches, no worse in severity or quality.  He has been seen and evaluated at 117 times in the last 6 months for similar complaints and other minor complaints.Has not tried anything for his symptoms.  No aggravating or alleviating factors noted.  He tells me "I am just tired ".No recent trauma or falls.   The history is provided by the patient.    Past Medical History:  Diagnosis Date  . Anxiety   . Asthma   . Cocaine abuse (HCC)   . Homelessness   . Migraine   . Tobacco abuse     Patient Active Problem List   Diagnosis Date Noted  . 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 Cocaine use disorder, moderate, dependence (HCC) 07/26/2016  . 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 Psychosis (HCC) 07/24/2016  . Chest pain 11/23/2015  . Asthma 11/23/2015  . Tobacco abuse 11/23/2015  . Elevated lactic acid level 11/23/2015  . Cocaine abuse (HCC)   . Concern about STD in male without diagnosis   . Paranoid ideation (HCC) 02/22/2015    History reviewed. No pertinent surgical history.      Home Medications    Prior to Admission medications   Medication Sig Start Date End Date Taking? Authorizing Provider  acetaminophen (TYLENOL) 325 MG tablet Take 650 mg by mouth every 6 (six) hours as needed for moderate pain.    [provider]  naproxen (NAPROSYN) 500 MG  tablet Take 1 tablet (500 mg total) by mouth 2 (two) times daily. 05/13/17   Rolland PorterJames, Mark, MD  nystatin (MYCOSTATIN/NYSTOP) powder Apply topically 4 (four) times daily. 04/21/17   Roxy HorsemanBrowning, Robert, PA-C  terbinafine (LAMISIL AT) 1 % cream Apply 1 application topically 2 (two) times daily. 05/07/17   Ward, Layla MawKristen N, DO    Family History History reviewed. No pertinent family history.  Social History Social History   Tobacco Use  . Smoking status: Current Every Day Smoker    Packs/day: 1.00    Years: 0.00    Pack years: 0.00    Types: Cigarettes, Cigars  . Smokeless tobacco: Never Used  Substance Use Topics  . Alcohol use: Not Currently    Frequency: Never  . Drug use: Not Currently     Allergies   Haldol [haloperidol lactate]   Review of Systems Review of Systems  Constitutional: Negative for chills and fever.  Eyes: Negative for visual disturbance.  Neurological: Positive for headaches. Negative for syncope, weakness and numbness.  All other systems reviewed and are negative.    Physical Exam Updated Vital Signs BP (!) 152/85 (BP Location: Right Arm)   Pulse 91   Temp 98.7 F (37.1 C) (Oral)   Resp 12   Ht 5\' 7"  (1.702 m)   Wt 72.6 kg (160 lb)   SpO2 100%   BMI 25.06 kg/m   Physical Exam  Constitutional: He is  oriented to person, place, and time. He appears well-developed and well-nourished. No distress.  Resting in chair, somewhat sleepy but very easily arousable.  Answers questions appropriately.  HENT:  Head: Normocephalic and atraumatic.  Eyes: Conjunctivae are normal. Right eye exhibits no discharge. Left eye exhibits no discharge.  Neck: No JVD present. No tracheal deviation present.  Cardiovascular: Normal rate.  Pulmonary/Chest: Effort normal.  Abdominal: He exhibits no distension.  Musculoskeletal: Normal range of motion. He exhibits no edema or tenderness.  Neurological: He is alert and oriented to person, place, and time. No cranial nerve deficit or  sensory deficit. He exhibits normal muscle tone. GCS eye subscore is 4. GCS verbal subscore is 5. GCS motor subscore is 6.  Skin: Skin is warm and dry. No erythema.  Psychiatric: He has a normal mood and affect. His behavior is normal.  Nursing note and vitals reviewed.    ED Treatments / Results  Labs (all labs ordered are listed, but only abnormal results are displayed) Labs Reviewed - No data to display  EKG None  Radiology No results found.  Procedures Procedures (including critical care time)  Medications Ordered in ED Medications  acetaminophen (TYLENOL) tablet 650 mg (650 mg Oral Given 05/13/17 2127)     Initial Impression / Assessment and Plan / ED Course  I have reviewed the triage vital signs and the nursing notes.  Pertinent labs & imaging results that were available during my care of the patient were reviewed by me and considered in my medical decision making (see chart for details).     Patient is well-known to this ED.  He is afebrile, vital signs are at patient's baseline. He has a care plan in place for his frequent complaints including his headaches.  No focal neurologic deficits on examination.  No recent history of trauma.  No red flag signs concerning for [facility], [facility], or CVA.  He is requesting Tylenol for his headache which I think is reasonable.I further encouraged the patient to follow-up with his primary care physician who is listed as Lavinia Sharps at the [facility]. He verbalizes understanding of and agreement with plan is stable for discharge home at this time.  Final Clinical Impressions(s) / ED Diagnoses   Final diagnoses:  Headache, chronic daily  Homelessness    ED Discharge Orders    None      Bennye Alm 05/13/17 2138  Arby Barrette, MD 05/16/17 1807

## 2017-05-13 NOTE — ED Triage Notes (Signed)
Pt c/o migraine that occurs "every day" Denies N/V/D. A/Ox4.

## 2017-05-13 NOTE — Discharge Instructions (Addendum)
Drink plenty of water and get plenty of rest.  Follow-up with your primary care physician Dustin Norris for reevaluation of your chronic headaches.  It is not appropriate to use the emergency department for management of your chronic headaches.  Only return to the emergency department if any concerning signs or symptoms develop such as fevers, severe worsening headache, vision changes.

## 2017-05-13 NOTE — ED Notes (Signed)
Pt states "y'all be careful." upon discharge, walked out and sat down in the lobby. Security made aware.

## 2017-05-13 NOTE — ED Notes (Addendum)
Pt states "Dole FoodY'all dont like [place] people do you. Y'all don't even act like y'all care." Verbal deescalation used. Pt calm and cooperative upon med administration and discharge.

## 2017-05-13 NOTE — ED Provider Notes (Signed)
[facility]-EMERGENCY DEPT Provider Note   CSN: [id number] Arrival date & time: 05/12/17  2148     History   Chief Complaint No chief complaint on file.   HPI Dustin Norris is a 42 y.o. male.    HPI CC: Migraine 42 year old male.  He is homeless.  His initial complaint to me is "I just want a sleep".  Triage note states initially that he was paranoid.  He denies any concerns about someone following him or hurting he or his family.  He currently denies methamphetamine use.  He states he has migraines every day.  States it is "not too bad".  He declines any attempted treatment here.    Past Medical History:  Diagnosis Date  . Anxiety   . Asthma   . Cocaine abuse (HCC)   . Homelessness   . Migraine   . Tobacco abuse     Patient Active Problem List   Diagnosis Date Noted  . 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 Cocaine use disorder, moderate, dependence (HCC) 07/26/2016  . 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 Psychosis (HCC) 07/24/2016  . Chest pain 11/23/2015  . Asthma 11/23/2015  . Tobacco abuse 11/23/2015  . Elevated lactic acid level 11/23/2015  . Cocaine abuse (HCC)   . Concern about STD in male without diagnosis   . Paranoid ideation (HCC) 02/22/2015    History reviewed. No pertinent surgical history.      Home Medications    Prior to Admission medications   Medication Sig Start Date End Date Taking? Authorizing Provider  acetaminophen (TYLENOL) 325 MG tablet Take 650 mg by mouth every 6 (six) hours as needed for moderate pain.   Yes [provider]  naproxen (NAPROSYN) 500 MG tablet Take 1 tablet (500 mg total) by mouth 2 (two) times daily. 05/13/17   Rolland PorterJames, Takeshi Teasdale, MD  nystatin (MYCOSTATIN/NYSTOP) powder Apply topically 4 (four) times daily. 04/21/17   Roxy HorsemanBrowning, Robert, PA-C  terbinafine (LAMISIL AT) 1 % cream Apply 1 application topically 2 (two) times daily.  05/07/17   Ward, Layla MawKristen N, DO    Family History No family history on file.  Social History Social History   Tobacco Use  . Smoking status: Current Every Day Smoker    Packs/day: 1.00    Years: 0.00    Pack years: 0.00    Types: Cigarettes, Cigars  . Smokeless tobacco: Never Used  Substance Use Topics  . Alcohol use: Not Currently    Frequency: Never  . Drug use: Not Currently     Allergies   Haldol [haloperidol lactate]   Review of Systems Review of Systems  Constitutional: Negative for appetite change, chills, diaphoresis, fatigue and fever.  HENT: Negative for mouth sores, sore throat and trouble swallowing.   Eyes: Negative for visual disturbance.  Respiratory: Negative for cough, chest tightness, shortness of breath and wheezing.   Cardiovascular: Negative for chest pain.  Gastrointestinal: Negative for abdominal distention, abdominal pain, diarrhea, nausea and vomiting.  Endocrine: Negative for polydipsia, polyphagia and polyuria.  Genitourinary: Negative for dysuria, frequency and hematuria.  Musculoskeletal: Negative for gait problem.  Skin: Negative for color change, pallor and rash.  Neurological: Positive for headaches. Negative for dizziness, syncope and light-headedness.  Hematological: Does not bruise/bleed easily.  Psychiatric/Behavioral: Negative for behavioral problems and confusion.     Physical Exam Updated Vital Signs BP 116/81 (BP Location: Right Arm)   Pulse (!) 57   Temp 97.8 F (  36.6 C) (Oral)   Resp 16   SpO2 98%   Physical Exam  Constitutional: He is oriented to person, place, and time. He appears well-developed and well-nourished. No distress.  HENT:  Head: Normocephalic.  Eyes: Pupils are equal, round, and reactive to light. Conjunctivae are normal. No scleral icterus.  Neck: Normal range of motion. Neck supple. No thyromegaly present.  Cardiovascular: Normal rate and regular rhythm. Exam reveals no gallop and no friction rub.  No  murmur heard. Pulmonary/Chest: Effort normal and breath sounds normal. No respiratory distress. He has no wheezes. He has no rales.  Abdominal: Soft. Bowel sounds are normal. He exhibits no distension. There is no tenderness. There is no rebound.  Musculoskeletal: Normal range of motion.  Neurological: He is alert and oriented to person, place, and time.  Skin: Skin is warm and dry. No rash noted.  Psychiatric: He has a normal mood and affect. His behavior is normal.     ED Treatments / Results  Labs (all labs ordered are listed, but only abnormal results are displayed) Labs Reviewed - No data to display  EKG None  Radiology No results found.  Procedures Procedures (including critical care time)  Medications Ordered in ED Medications  ondansetron (ZOFRAN-ODT) disintegrating tablet 4 mg (has no administration in time range)  traMADol (ULTRAM) tablet 50 mg (has no administration in time range)     Initial Impression / Assessment and Plan / ED Course  I have reviewed the triage vital signs and the nursing notes.  Pertinent labs & imaging results that were available during my care of the patient were reviewed by me and considered in my medical decision making (see chart for details).   Normal exam.  Offered simple treatment for his headache.  Discharge.  Final Clinical Impressions(s) / ED Diagnoses   Final diagnoses:  Other migraine without status migrainosus, not intractable    ED Discharge Orders        Ordered    naproxen (NAPROSYN) 500 MG tablet  2 times daily     05/13/17 0739       Rolland Porter, MD 05/13/17 [phone]

## 2017-05-14 ENCOUNTER — Emergency Department (HOSPITAL_COMMUNITY)
Admission: EM | Admit: 2017-05-14 | Discharge: 2017-05-14 | Disposition: A | Discharge status: Home / Self Care | Payer: Self-pay | Attending: Physician Assistant | Admitting: Physician Assistant

## 2017-05-14 ENCOUNTER — Encounter (HOSPITAL_COMMUNITY): Payer: Self-pay | Admitting: Emergency Medicine

## 2017-05-14 ENCOUNTER — Other Ambulatory Visit: Payer: Self-pay

## 2017-05-14 DIAGNOSIS — J4531 Mild persistent asthma with (acute) exacerbation: Secondary | ICD-10-CM | POA: Insufficient documentation

## 2017-05-14 DIAGNOSIS — F17210 Nicotine dependence, cigarettes, uncomplicated: Secondary | ICD-10-CM | POA: Insufficient documentation

## 2017-05-14 DIAGNOSIS — J4521 Mild intermittent asthma with (acute) exacerbation: Secondary | ICD-10-CM

## 2017-05-14 DIAGNOSIS — Z590 Homelessness: Secondary | ICD-10-CM | POA: Insufficient documentation

## 2017-05-14 MED ORDER — ALBUTEROL SULFATE HFA 108 (90 BASE) MCG/ACT IN AERS: 1.0000 | INHALATION_SPRAY | Freq: Four times a day (QID) | RESPIRATORY_TRACT | 0 refills | Status: DC | PRN

## 2017-05-14 MED ORDER — ALBUTEROL SULFATE HFA 108 (90 BASE) MCG/ACT IN AERS
1.0000 | INHALATION_SPRAY | Freq: Once | RESPIRATORY_TRACT | Status: AC
  Administered 2017-05-14: 1 via RESPIRATORY_TRACT
  Filled 2017-05-14: qty 6.7

## 2017-05-14 MED ORDER — PREDNISONE 10 MG PO TABS: 40.0000 mg | ORAL_TABLET | Freq: Every day | ORAL | 0 refills | Status: AC

## 2017-05-14 NOTE — ED Provider Notes (Signed)
[facility]   CSN: [id number] Arrival date & time: 05/14/17  0345     History   Chief Complaint Chief Complaint  Patient presents with  . Shortness of Breath    HPI Dustin Norris is a 42 y.o. male With past medical history of cocaine abuse, homelessness, well-known to this ED with 118 visits in the past 6 months, presents to ED for asthma exacerbation.  Patient states that he ran out of his inhaler 2-3 days ago.  He also states that he does not "feel well."  Denies any chest pain, shortness of breath, fever, vomiting.  HPI  Past Medical History:  Diagnosis Date  . Anxiety   . Asthma   . Cocaine abuse (HCC)   . Homelessness   . Migraine   . Tobacco abuse     Patient Active Problem List   Diagnosis Date Noted  . 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 Cocaine use disorder, moderate, dependence (HCC) 07/26/2016  . 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 Psychosis (HCC) 07/24/2016  . Chest pain 11/23/2015  . Asthma 11/23/2015  . Tobacco abuse 11/23/2015  . Elevated lactic acid level 11/23/2015  . Cocaine abuse (HCC)   . Concern about STD in male without diagnosis   . Paranoid ideation (HCC) 02/22/2015    History reviewed. No pertinent surgical history.      Home Medications    Prior to Admission medications   Medication Sig Start Date End Date Taking? Authorizing Provider  acetaminophen (TYLENOL) 325 MG tablet Take 650 mg by mouth every 6 (six) hours as needed for moderate pain.   Yes [provider]  albuterol (PROVENTIL HFA;VENTOLIN HFA) 108 (90 Base) MCG/ACT inhaler Inhale 1-2 puffs into the lungs every 6 (six) hours as needed for wheezing or shortness of breath. 05/14/17   Ankush Gintz, PA-C  naproxen (NAPROSYN) 500 MG tablet Take 1 tablet (500 mg total) by mouth 2 (two) times daily. 05/13/17   Rolland PorterJames, Mark, MD  nystatin (MYCOSTATIN/NYSTOP)  powder Apply topically 4 (four) times daily. 04/21/17   Roxy HorsemanBrowning, Robert, PA-C  predniSONE (DELTASONE) 10 MG tablet Take 4 tablets (40 mg total) by mouth daily for 5 days. 05/14/17 05/19/17  Arayna Illescas, PA-C  terbinafine (LAMISIL AT) 1 % cream Apply 1 application topically 2 (two) times daily. 05/07/17   Ward, Layla MawKristen N, DO    Family History History reviewed. No pertinent family history.  Social History Social History   Tobacco Use  . Smoking status: Current Every Day Smoker    Packs/day: 1.00    Years: 0.00    Pack years: 0.00    Types: Cigarettes, Cigars  . Smokeless tobacco: Never Used  Substance Use Topics  . Alcohol use: Not Currently    Frequency: Never  . Drug use: Not Currently     Allergies   Haldol [haloperidol lactate]   Review of Systems Review of Systems  Constitutional: Negative for appetite change, chills and fever.  HENT: Negative for ear pain, rhinorrhea, sneezing and sore throat.   Eyes: Negative for photophobia and visual disturbance.  Respiratory: Positive for wheezing. Negative for cough, chest tightness and shortness of breath.   Cardiovascular: Negative for chest pain and palpitations.  Gastrointestinal: Negative for abdominal pain, blood in stool, constipation, diarrhea, nausea and vomiting.  Genitourinary: Negative for dysuria, hematuria and urgency.  Musculoskeletal: Negative for myalgias.  Skin: Negative for rash.  Neurological: Negative for dizziness, weakness and light-headedness.  Physical Exam Updated Vital Signs BP 139/90 (BP Location: Left Arm)   Pulse 91   Temp 98.3 F (36.8 C) (Oral)   Resp 18   Ht 5\' 7"  (1.702 m)   Wt 72.6 kg (160 lb)   SpO2 99%   BMI 25.06 kg/m   Physical Exam  Constitutional: He appears well-developed and well-nourished. No distress.  HENT:  Head: Normocephalic and atraumatic.  Nose: Nose normal.  Eyes: Conjunctivae and EOM are normal. Left eye exhibits no discharge. No scleral icterus.  Neck: Normal  range of motion. Neck supple.  Cardiovascular: Normal rate, regular rhythm, normal heart sounds and intact distal pulses. Exam reveals no gallop and no friction rub.  No murmur heard. Pulmonary/Chest: Effort normal and breath sounds normal. No respiratory distress.  Lungs clear to auscultation bilaterally.  Abdominal: Soft. Bowel sounds are normal. He exhibits no distension. There is no tenderness. There is no guarding.  Musculoskeletal: Normal range of motion. He exhibits no edema.  Neurological: He is alert. He exhibits normal muscle tone. Coordination normal.  Skin: Skin is warm and dry. No rash noted.  Psychiatric: He has a normal mood and affect.  Nursing note and vitals reviewed.    ED Treatments / Results  Labs (all labs ordered are listed, but only abnormal results are displayed) Labs Reviewed - No data to display  EKG None  Radiology No results found.  Procedures Procedures (including critical care time)  Medications Ordered in ED Medications  albuterol (PROVENTIL HFA;VENTOLIN HFA) 108 (90 Base) MCG/ACT inhaler 1 puff (has no administration in time range)     Initial Impression / Assessment and Plan / ED Course  I have reviewed the triage vital signs and the nursing notes.  Pertinent labs & imaging results that were available during my care of the patient were reviewed by me and considered in my medical decision making (see chart for details).     Patient presents to ED for evaluation of asthma exacerbation.  Patient is well-known to this ED with 118 visits in the past 6 months.  His lungs are clear to auscultation bilaterally.  He is resting comfortably and is in no acute distress.  Patient was given a refill of his albuterol inhaler here and will be given prescription as well.  Do not see a need for a breathing treatment at this time.  Denies any chest pain, shortness of breath, fever. Advised to return for any severe or worsening symptoms.  Portions of this note  were generated with Scientist, clinical (histocompatibility and immunogenetics). Dictation errors may occur despite best attempts at proofreading.   Final Clinical Impressions(s) / ED Diagnoses   Final diagnoses:  Mild intermittent asthma with exacerbation    ED Discharge Orders        Ordered    predniSONE (DELTASONE) 10 MG tablet  Daily     05/14/17 0716    albuterol (PROVENTIL HFA;VENTOLIN HFA) 108 (90 Base) MCG/ACT inhaler  Every 6 hours PRN     05/14/17 0716       Dietrich Pates, PA-C 05/14/17 0717    Abelino Derrick, MD 05/14/17 1257

## 2017-05-14 NOTE — ED Triage Notes (Signed)
Patient is complaining of his asthma. Patient lung sounds is clear. Patient O@ sat 99% on room air.

## 2017-05-14 NOTE — ED Triage Notes (Signed)
Patient states he is having problems with his asthma. Patient does not have any wheezing and O2 Sat is 100%.

## 2017-05-14 NOTE — ED Notes (Signed)
Bed: WTR7 Expected date:  Expected time:  Means of arrival:  Comments: 

## 2017-05-14 NOTE — ED Notes (Signed)
Patient combative. States "Im going to kill you all". Security, [employer] called. Escorted out.

## 2017-05-15 DIAGNOSIS — J45909 Unspecified asthma, uncomplicated: Secondary | ICD-10-CM | POA: Insufficient documentation

## 2017-05-15 DIAGNOSIS — R5383 Other fatigue: Secondary | ICD-10-CM | POA: Insufficient documentation

## 2017-05-16 ENCOUNTER — Encounter (HOSPITAL_COMMUNITY): Payer: Self-pay | Admitting: Emergency Medicine

## 2017-05-16 ENCOUNTER — Emergency Department (HOSPITAL_COMMUNITY)
Admission: EM | Admit: 2017-05-16 | Discharge: 2017-05-16 | Disposition: A | Discharge status: Home / Self Care | Payer: Self-pay | Attending: Emergency Medicine | Admitting: Emergency Medicine

## 2017-05-16 ENCOUNTER — Other Ambulatory Visit: Payer: Self-pay

## 2017-05-16 DIAGNOSIS — R5383 Other fatigue: Secondary | ICD-10-CM

## 2017-05-16 NOTE — Discharge Instructions (Addendum)
1. Medications: usual home medications °2. Treatment: rest, drink plenty of fluids,  °3. Follow Up: Please followup with your primary doctor in 1-2 days for discussion of your diagnoses and further evaluation after today's visit; if you do not have a primary care doctor use the resource guide provided to find one; Please return to the ER for worsening symptoms ° °

## 2017-05-16 NOTE — ED Notes (Signed)
Pt walked out of triage 5 and is not seen in the lobby at this time.

## 2017-05-16 NOTE — ED Triage Notes (Signed)
Pt reports not feeling too good and coughing a little bit.

## 2017-05-16 NOTE — ED Triage Notes (Signed)
Pt called x 2 to be triaged with no answer

## 2017-05-16 NOTE — ED Provider Notes (Signed)
[facility]-EMERGENCY DEPT Provider Note   CSN: [id number] Arrival date & time: 05/15/17  2323     History   Chief Complaint Chief Complaint  Patient presents with  . Cough    HPI Dustin Norris is a 42 y.o. male with a hx of Asthma, anxiety, cocaine abuse, homelessness, migraine headache, tobacco abuse presents to the Emergency Department stating that he is simply tired.  Pt reports that he was outside and became tired.  He felt that he simply needed to lay down for awhile. Triage note reports "not feeling good and coughing a little bit."  Pt reports no difficulty breathing, fevers, chills, headache, neck pain, chest pain, SOB. He reports his cough does not bother him.  No aggravating or alleviating factors.     The history is provided by the patient and medical records. No language interpreter was used.    Past Medical History:  Diagnosis Date  . Anxiety   . Asthma   . Cocaine abuse (HCC)   . Homelessness   . Migraine   . Tobacco abuse     Patient Active Problem List   Diagnosis Date Noted  . 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 Cocaine use disorder, moderate, dependence (HCC) 07/26/2016  . 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 Psychosis (HCC) 07/24/2016  . Chest pain 11/23/2015  . Asthma 11/23/2015  . Tobacco abuse 11/23/2015  . Elevated lactic acid level 11/23/2015  . Cocaine abuse (HCC)   . Concern about STD in male without diagnosis   . Paranoid ideation (HCC) 02/22/2015    History reviewed. No pertinent surgical history.      Home Medications    Prior to Admission medications   Medication Sig Start Date End Date Taking? Authorizing Provider  acetaminophen (TYLENOL) 325 MG tablet Take 650 mg by mouth every 6 (six) hours as needed for moderate pain.   Yes [provider]  albuterol (PROVENTIL HFA;VENTOLIN HFA) 108 (90 Base) MCG/ACT inhaler Inhale 1-2 puffs  into the lungs every 6 (six) hours as needed for wheezing or shortness of breath. 05/14/17   Khatri, Hina, PA-C  naproxen (NAPROSYN) 500 MG tablet Take 1 tablet (500 mg total) by mouth 2 (two) times daily. 05/13/17   Rolland PorterJames, Mark, MD  nystatin (MYCOSTATIN/NYSTOP) powder Apply topically 4 (four) times daily. 04/21/17   Roxy HorsemanBrowning, Robert, PA-C  predniSONE (DELTASONE) 10 MG tablet Take 4 tablets (40 mg total) by mouth daily for 5 days. 05/14/17 05/19/17  Khatri, Hina, PA-C  terbinafine (LAMISIL AT) 1 % cream Apply 1 application topically 2 (two) times daily. 05/07/17   Ward, Layla MawKristen N, DO    Family History History reviewed. No pertinent family history.  Social History Social History   Tobacco Use  . Smoking status: Current Every Day Smoker    Packs/day: 1.00    Years: 0.00    Pack years: 0.00    Types: Cigarettes, Cigars  . Smokeless tobacco: Never Used  Substance Use Topics  . Alcohol use: Not Currently    Frequency: Never  . Drug use: Not Currently     Allergies   Haldol [haloperidol lactate]   Review of Systems Review of Systems  Constitutional: Positive for fatigue. Negative for appetite change, diaphoresis, fever and unexpected weight change.  HENT: Negative for mouth sores.   Eyes: Negative for visual disturbance.  Respiratory: Positive for cough. Negative for chest tightness, shortness of breath and wheezing.   Cardiovascular: Negative for chest pain.  Gastrointestinal: Negative for abdominal pain, constipation, diarrhea, nausea and vomiting.  Endocrine: Negative for polydipsia, polyphagia and polyuria.  Genitourinary: Negative for dysuria, frequency, hematuria and urgency.  Musculoskeletal: Negative for back pain and neck stiffness.  Skin: Negative for rash.  Allergic/Immunologic: Negative for immunocompromised state.  Neurological: Negative for syncope, light-headedness and headaches.  Hematological: Does not bruise/bleed easily.  Psychiatric/Behavioral: Negative for sleep  disturbance. The patient is not nervous/anxious.      Physical Exam Updated Vital Signs BP (!) 149/90 (BP Location: Left Arm)   Pulse 73   Temp 98.6 F (37 C) (Oral)   Resp 18   Ht 5\' 7"  (1.702 m)   Wt 72.6 kg (160 lb)   SpO2 100%   BMI 25.06 kg/m   Physical Exam  Constitutional: He appears well-developed and well-nourished. No distress.  HENT:  Head: Normocephalic.  Eyes: Conjunctivae are normal. No scleral icterus.  Neck: Normal range of motion.  Cardiovascular: Normal rate and intact distal pulses.  Pulmonary/Chest: Effort normal and breath sounds normal. No tachypnea. He has no decreased breath sounds. He has no wheezes. He has no rhonchi. He has no rales.  Abdominal: Soft. There is no tenderness.  Musculoskeletal: Normal range of motion.  Neurological: He is alert.  Skin: Skin is warm and dry.  Nursing note and vitals reviewed.    ED Treatments / Results   Procedures Procedures (including critical care time)  Medications Ordered in ED Medications - No data to display   Initial Impression / Assessment and Plan / ED Course  I have reviewed the triage vital signs and the nursing notes.  Pertinent labs & imaging results that were available during my care of the patient were reviewed by me and considered in my medical decision making (see chart for details).     Patient presents with complaints of fatigue.  He does have a history of asthma however has no shortness of breath wheezing or hypoxia on exam today.  No acute complaints and no acute findings on physical exam.  Patient reports he simply needed a place to lie down.  He is well-appearing, alert and oriented x4.  He will be discharged home.   Final Clinical Impressions(s) / ED Diagnoses   Final diagnoses:  Other fatigue    ED Discharge Orders    None       Mardene Sayer Boyd Kerbs 05/16/17 0319    Palumbo, April, MD 05/16/17 0330

## 2017-05-16 NOTE — ED Triage Notes (Signed)
Pt called to be triaged with no answer x 1 

## 2017-05-17 ENCOUNTER — Emergency Department (HOSPITAL_COMMUNITY): Admission: EM | Admit: 2017-05-17 | Discharge: 2017-05-17 | Discharge status: Against Medical Advice | Payer: Self-pay

## 2017-05-18 ENCOUNTER — Emergency Department (HOSPITAL_COMMUNITY): Admission: EM | Admit: 2017-05-18 | Discharge: 2017-05-18 | Discharge status: Against Medical Advice | Payer: Self-pay

## 2017-05-18 NOTE — ED Triage Notes (Signed)
Pt called again from triage with no answer 

## 2017-05-18 NOTE — ED Notes (Signed)
Bed: WTR6 Expected date:  Expected time:  Means of arrival:  Comments: 

## 2017-05-18 NOTE — ED Triage Notes (Signed)
Pt called from triage with no answer 

## 2017-05-19 ENCOUNTER — Emergency Department (HOSPITAL_COMMUNITY)
Admission: EM | Admit: 2017-05-19 | Discharge: 2017-05-19 | Disposition: A | Discharge status: Home / Self Care | Payer: Self-pay | Attending: Emergency Medicine | Admitting: Emergency Medicine

## 2017-05-19 ENCOUNTER — Encounter (HOSPITAL_COMMUNITY): Payer: Self-pay | Admitting: *Deleted

## 2017-05-19 ENCOUNTER — Other Ambulatory Visit: Payer: Self-pay

## 2017-05-19 DIAGNOSIS — M79671 Pain in right foot: Secondary | ICD-10-CM | POA: Insufficient documentation

## 2017-05-19 DIAGNOSIS — Z5321 Procedure and treatment not carried out due to patient leaving prior to being seen by health care provider: Secondary | ICD-10-CM | POA: Insufficient documentation

## 2017-05-19 NOTE — ED Triage Notes (Signed)
PT call in front lobby with no answer.

## 2017-05-19 NOTE — ED Triage Notes (Signed)
The pt is c/o a blister on the rt foot for 3-4 days

## 2017-06-14 DIAGNOSIS — R51 Headache: Secondary | ICD-10-CM | POA: Insufficient documentation

## 2017-06-14 DIAGNOSIS — F17210 Nicotine dependence, cigarettes, uncomplicated: Secondary | ICD-10-CM | POA: Insufficient documentation

## 2017-06-14 DIAGNOSIS — J45909 Unspecified asthma, uncomplicated: Secondary | ICD-10-CM | POA: Insufficient documentation

## 2017-06-14 DIAGNOSIS — Z79899 Other long term (current) drug therapy: Secondary | ICD-10-CM | POA: Insufficient documentation

## 2017-06-15 ENCOUNTER — Encounter (HOSPITAL_COMMUNITY): Payer: Self-pay | Admitting: Emergency Medicine

## 2017-06-15 ENCOUNTER — Emergency Department (HOSPITAL_COMMUNITY)
Admission: EM | Admit: 2017-06-15 | Discharge: 2017-06-15 | Disposition: A | Discharge status: Home / Self Care | Payer: Self-pay | Attending: Emergency Medicine | Admitting: Emergency Medicine

## 2017-06-15 ENCOUNTER — Other Ambulatory Visit: Payer: Self-pay

## 2017-06-15 ENCOUNTER — Encounter (HOSPITAL_COMMUNITY): Payer: Self-pay

## 2017-06-15 DIAGNOSIS — R51 Headache: Secondary | ICD-10-CM | POA: Insufficient documentation

## 2017-06-15 DIAGNOSIS — Z79899 Other long term (current) drug therapy: Secondary | ICD-10-CM | POA: Insufficient documentation

## 2017-06-15 DIAGNOSIS — R519 Headache, unspecified: Secondary | ICD-10-CM

## 2017-06-15 DIAGNOSIS — J45909 Unspecified asthma, uncomplicated: Secondary | ICD-10-CM | POA: Insufficient documentation

## 2017-06-15 DIAGNOSIS — F17210 Nicotine dependence, cigarettes, uncomplicated: Secondary | ICD-10-CM | POA: Insufficient documentation

## 2017-06-15 MED ORDER — ACETAMINOPHEN 325 MG PO TABS
650.0000 mg | ORAL_TABLET | Freq: Once | ORAL | Status: AC
  Administered 2017-06-15: 650 mg via ORAL
  Filled 2017-06-15: qty 2

## 2017-06-15 NOTE — ED Provider Notes (Signed)
[facility]-EMERGENCY DEPT Provider Note   CSN: [id number] Arrival date & time: 06/15/17  1856     History   Chief Complaint Chief Complaint  Patient presents with  . Migraine    HPI Dustin Norris is a 42 y.o. male.  Patient returns to the ER tonight with his typical chief complaint of headache.  He states that he is under a lot of stress because he has to go to court in the morning.  He denies any fever, chills, or neck stiffness.  He denies numbness, weakness, or tingling.  Denies slurred speech or vision changes.  This is his typical headache.  The history is provided by the patient. No language interpreter was used.    Past Medical History:  Diagnosis Date  . Anxiety   . Asthma   . Cocaine abuse (HCC)   . Homelessness   . Migraine   . Tobacco abuse     Patient Active Problem List   Diagnosis Date Noted  . 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 Cocaine use disorder, moderate, dependence (HCC) 07/26/2016  . 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 Psychosis (HCC) 07/24/2016  . Chest pain 11/23/2015  . Asthma 11/23/2015  . Tobacco abuse 11/23/2015  . Elevated lactic acid level 11/23/2015  . Cocaine abuse (HCC)   . Concern about STD in male without diagnosis   . Paranoid ideation (HCC) 02/22/2015    History reviewed. No pertinent surgical history.      Home Medications    Prior to Admission medications   Medication Sig Start Date End Date Taking? Authorizing Provider  acetaminophen (TYLENOL) 325 MG tablet Take 650 mg by mouth every 6 (six) hours as needed for moderate pain.    [provider]  albuterol (PROVENTIL HFA;VENTOLIN HFA) 108 (90 Base) MCG/ACT inhaler Inhale 1-2 puffs into the lungs every 6 (six) hours as needed for wheezing or shortness of breath. 05/14/17   Khatri, Hina, PA-C  naproxen (NAPROSYN) 500 MG tablet Take 1 tablet (500 mg total) by mouth 2  (two) times daily. 05/13/17   Rolland Porter, MD  nystatin (MYCOSTATIN/NYSTOP) powder Apply topically 4 (four) times daily. 04/21/17   Roxy Horseman, PA-C  terbinafine (LAMISIL AT) 1 % cream Apply 1 application topically 2 (two) times daily. 05/07/17   Ward, Layla Maw, DO    Family History No family history on file.  Social History Social History   Tobacco Use  . Smoking status: Current Every Day Smoker    Packs/day: 1.00    Years: 0.00    Pack years: 0.00    Types: Cigarettes, Cigars  . Smokeless tobacco: Never Used  Substance Use Topics  . Alcohol use: Not Currently    Frequency: Never  . Drug use: Not Currently     Allergies   Haldol [haloperidol lactate]   Review of Systems Review of Systems  All other systems reviewed and are negative.    Physical Exam Updated Vital Signs BP (!) 156/111 (BP Location: Right Arm)   Pulse 89   Temp 98.5 F (36.9 C) (Oral)   Resp 13   Ht  (1.702 m)   Wt 68 kg (150 lb)   SpO2 100%   BMI 23.49 kg/m   Physical Exam  Constitutional: He is oriented to person, place, and time. He appears well-developed and well-nourished.  HENT:  Head: Normocephalic and atraumatic.  Right Ear: External ear normal.  Left Ear: External ear normal.  Eyes: Pupils are equal, round, and reactive to light. Conjunctivae and EOM are normal.  Neck: Normal range of motion. Neck supple.  No pain with neck flexion, no meningismus  Cardiovascular: Normal rate, regular rhythm and normal heart sounds. Exam reveals no gallop and no friction rub.  No murmur heard. Pulmonary/Chest: Effort normal and breath sounds normal. No respiratory distress. He has no wheezes. He has no rales. He exhibits no tenderness.  Abdominal: Soft. He exhibits no distension and no mass. There is no tenderness. There is no rebound and no guarding.  Musculoskeletal: Normal range of motion. He exhibits no edema or tenderness.  Normal gait.  Neurological: He is alert and oriented to  person, place, and time. He has normal reflexes.  CN 3-12 intact, normal finger to nose, no pronator drift, sensation and strength intact bilaterally.  Skin: Skin is warm and dry.  Psychiatric: He has a normal mood and affect. His behavior is normal. Judgment and thought content normal.  Nursing note and vitals reviewed.    ED Treatments / Results  Labs (all labs ordered are listed, but only abnormal results are displayed) Labs Reviewed - No data to display  EKG None  Radiology No results found.  Procedures Procedures (including critical care time)  Medications Ordered in ED Medications  acetaminophen (TYLENOL) tablet 650 mg (650 mg Oral Given 06/15/17 2320)     Initial Impression / Assessment and Plan / ED Course  I have reviewed the triage vital signs and the nursing notes.  Pertinent labs & imaging results that were available during my care of the patient were reviewed by me and considered in my medical decision making (see chart for details).     Pt HA treated while in ED.  Presentation is like pts typical HA and non concerning for [facility], ICH, Meningitis, or temporal arteritis. Pt is afebrile with no focal neuro deficits, nuchal rigidity, or change in vision. Pt is to follow up with PCP to discuss prophylactic medication. Pt verbalizes understanding and is agreeable with plan to dc.    Final Clinical Impressions(s) / ED Diagnoses   Final diagnoses:  Nonintractable headache, unspecified chronicity pattern, unspecified headache type    ED Discharge Orders    None       Roxy Horseman, PA-C 06/15/17 2328    Ward, Layla Maw, DO 06/15/17 2332

## 2017-06-15 NOTE — ED Notes (Signed)
Bed: WTR7 Expected date:  Expected time:  Means of arrival:  Comments: 

## 2017-06-15 NOTE — ED Provider Notes (Cosign Needed)
11:13 PM Attempted to go see the patient.  RN and EMT in triage inform that they believe he left.   Roxy Horseman, PA-C 06/15/17 2314

## 2017-06-15 NOTE — ED Triage Notes (Signed)
Pt complains of a migraine all day, he states he just got off the train from [place] because he had to come back here to go to court

## 2017-06-15 NOTE — ED Notes (Signed)
AVS reviewed with pt, pt verbalizes understanding. VSS, pt a/o x4 and ambulatory upon DC.

## 2017-06-15 NOTE — ED Provider Notes (Signed)
[facility]-EMERGENCY DEPT Provider Note   CSN: [id number] Arrival date & time: 06/14/17  2341     History   Chief Complaint Chief Complaint  Patient presents with  . Migraine    HPI Dustin Norris is a 42 y.o. male.  The history is provided by the patient and medical records.     42 y.o. M with hx of anxiety, asthma, cocaine abuse, homelessness, chronic headaches, here with headache.  Patient with 123 ED visits in the past 6 months for similar complaints.  He complains of headache.  States "it is like it always is doc".  States he was on a bus today for 10 hours coming home from [place] and has not had much to eat or drink and has not had any sleep.  He is requesting to rest here for a while.  Past Medical History:  Diagnosis Date  . Anxiety   . Asthma   . Cocaine abuse (HCC)   . Homelessness   . Migraine   . Tobacco abuse     Patient Active Problem List   Diagnosis Date Noted  . 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 Cocaine use disorder, moderate, dependence (HCC) 07/26/2016  . 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 Psychosis (HCC) 07/24/2016  . Chest pain 11/23/2015  . Asthma 11/23/2015  . Tobacco abuse 11/23/2015  . Elevated lactic acid level 11/23/2015  . Cocaine abuse (HCC)   . Concern about STD in male without diagnosis   . Paranoid ideation (HCC) 02/22/2015    History reviewed. No pertinent surgical history.      Home Medications    Prior to Admission medications   Medication Sig Start Date End Date Taking? Authorizing Provider  acetaminophen (TYLENOL) 325 MG tablet Take 650 mg by mouth every 6 (six) hours as needed for moderate pain.    [provider]  albuterol (PROVENTIL HFA;VENTOLIN HFA) 108 (90 Base) MCG/ACT inhaler Inhale 1-2 puffs into the lungs every 6 (six) hours as needed for wheezing or shortness of breath. 05/14/17   Khatri, Hina, PA-C    naproxen (NAPROSYN) 500 MG tablet Take 1 tablet (500 mg total) by mouth 2 (two) times daily. 05/13/17   Rolland Porter, MD  nystatin (MYCOSTATIN/NYSTOP) powder Apply topically 4 (four) times daily. 04/21/17   Roxy Horseman, PA-C  terbinafine (LAMISIL AT) 1 % cream Apply 1 application topically 2 (two) times daily. 05/07/17   Ward, Layla Maw, DO    Family History History reviewed. No pertinent family history.  Social History Social History   Tobacco Use  . Smoking status: Current Every Day Smoker    Packs/day: 1.00    Years: 0.00    Pack years: 0.00    Types: Cigarettes, Cigars  . Smokeless tobacco: Never Used  Substance Use Topics  . Alcohol use: Not Currently    Frequency: Never  . Drug use: Not Currently     Allergies   Haldol [haloperidol lactate]   Review of Systems Review of Systems  Neurological: Positive for headaches.  All other systems reviewed and are negative.    Physical Exam Updated Vital Signs BP 130/87 (BP Location: Left Arm)   Pulse 84   Temp 98 F (36.7 C) (Oral)   Resp 12   Ht  (1.702 m)   Wt 68 kg (150 lb)   SpO2 99%   BMI 23.49 kg/m   Physical Exam  Constitutional: He is oriented to person, place,  and time. He appears well-developed and well-nourished. No distress.  HENT:  Head: Normocephalic and atraumatic.  Right Ear: External ear normal.  Left Ear: External ear normal.  Eyes: Pupils are equal, round, and reactive to light. Conjunctivae and EOM are normal.  Neck: Normal range of motion and full passive range of motion without pain. Neck supple. No neck rigidity.  No rigidity, no meningismus  Cardiovascular: Normal rate, regular rhythm and normal heart sounds.  No murmur heard. Pulmonary/Chest: Effort normal and breath sounds normal. No respiratory distress. He has no wheezes. He has no rhonchi.  Abdominal: Soft. Bowel sounds are normal. There is no tenderness. There is no guarding.  Musculoskeletal: Normal range of motion. He  exhibits no edema.  Neurological: He is alert and oriented to person, place, and time. He has normal strength. He displays no tremor. No cranial nerve deficit or sensory deficit. He displays no seizure activity.  AAOx3, answering questions and following commands appropriately; equal strength UE and LE bilaterally; CN grossly intact; moves all extremities appropriately without ataxia; no focal neuro deficits or facial asymmetry appreciated  Skin: Skin is warm and dry. No rash noted. He is not diaphoretic.  Psychiatric: He has a normal mood and affect. His behavior is normal. Thought content normal.  Nursing note and vitals reviewed.    ED Treatments / Results  Labs (all labs ordered are listed, but only abnormal results are displayed) Labs Reviewed - No data to display  EKG None  Radiology No results found.  Procedures Procedures (including critical care time)  Medications Ordered in ED Medications  acetaminophen (TYLENOL) tablet 650 mg (650 mg Oral Given 06/15/17 0200)     Initial Impression / Assessment and Plan / ED Course  I have reviewed the triage vital signs and the nursing notes.  Pertinent labs & imaging results that were available during my care of the patient were reviewed by me and considered in my medical decision making (see chart for details).  42 year old male here with headache.  Afebrile, nontoxic.  Neurologic exam is nonfocal.  No signs or symptoms concerning for meningitis.  Patient is well-known to this facility for similar complaints.  Seems today he mostly wants a place to rest. He was given dose of Tylenol here.  He is stable for discharge.  Strongly encourage PCP follow-up.   Discussed plan with patient, he acknowledged understanding and agreed with plan of care.  Return precautions given for new or worsening symptoms.  Final Clinical Impressions(s) / ED Diagnoses   Final diagnoses:  Bad headache    ED Discharge Orders    None       [name], PA-C 06/15/17 0244    Geoffery Lyons, MD 06/15/17 [phone]

## 2017-06-15 NOTE — ED Triage Notes (Signed)
Pt c/o migraine that has been constant since yesterday when he was seen here for it

## 2017-06-15 NOTE — Discharge Instructions (Signed)
Follow up with your primary care doctor.

## 2017-06-16 ENCOUNTER — Encounter (HOSPITAL_COMMUNITY): Payer: Self-pay | Admitting: Emergency Medicine

## 2017-06-16 ENCOUNTER — Emergency Department (HOSPITAL_COMMUNITY)
Admission: EM | Admit: 2017-06-16 | Discharge: 2017-06-16 | Disposition: A | Discharge status: Home / Self Care | Payer: Self-pay | Attending: Emergency Medicine | Admitting: Emergency Medicine

## 2017-06-16 ENCOUNTER — Other Ambulatory Visit: Payer: Self-pay

## 2017-06-16 DIAGNOSIS — Z5321 Procedure and treatment not carried out due to patient leaving prior to being seen by health care provider: Secondary | ICD-10-CM | POA: Insufficient documentation

## 2017-06-16 DIAGNOSIS — R42 Dizziness and giddiness: Secondary | ICD-10-CM | POA: Insufficient documentation

## 2017-06-16 NOTE — ED Notes (Signed)
Patient does not want to be seen. Refused to get vital signs done. Patient requested to stay in lobby for the night and nurse told patient that he needs to be triage. Patient refused security called.

## 2017-06-16 NOTE — ED Triage Notes (Signed)
Patient is complaining of dizziness from the tylenol he got earlier. Patient told EMS that he was mad because he was not allowed to sleep in the lobby. Patient walked in by hisself with no assistance.

## 2017-07-30 ENCOUNTER — Encounter (HOSPITAL_COMMUNITY): Payer: Self-pay

## 2017-07-30 ENCOUNTER — Emergency Department (HOSPITAL_COMMUNITY)
Admission: EM | Admit: 2017-07-30 | Discharge: 2017-07-30 | Disposition: A | Discharge status: Home / Self Care | Payer: Self-pay | Attending: Emergency Medicine | Admitting: Emergency Medicine

## 2017-07-30 ENCOUNTER — Emergency Department (HOSPITAL_COMMUNITY)
Admission: EM | Admit: 2017-07-30 | Discharge: 2017-07-30 | Discharge status: Against Medical Advice | Payer: Self-pay | Attending: Emergency Medicine | Admitting: Emergency Medicine

## 2017-07-30 ENCOUNTER — Other Ambulatory Visit: Payer: Self-pay

## 2017-07-30 ENCOUNTER — Encounter (HOSPITAL_COMMUNITY): Payer: Self-pay | Admitting: *Deleted

## 2017-07-30 DIAGNOSIS — R109 Unspecified abdominal pain: Secondary | ICD-10-CM | POA: Insufficient documentation

## 2017-07-30 DIAGNOSIS — R0602 Shortness of breath: Secondary | ICD-10-CM | POA: Insufficient documentation

## 2017-07-30 DIAGNOSIS — R1030 Lower abdominal pain, unspecified: Secondary | ICD-10-CM | POA: Insufficient documentation

## 2017-07-30 DIAGNOSIS — Z5321 Procedure and treatment not carried out due to patient leaving prior to being seen by health care provider: Secondary | ICD-10-CM | POA: Insufficient documentation

## 2017-07-30 DIAGNOSIS — J45909 Unspecified asthma, uncomplicated: Secondary | ICD-10-CM | POA: Insufficient documentation

## 2017-07-30 DIAGNOSIS — F17210 Nicotine dependence, cigarettes, uncomplicated: Secondary | ICD-10-CM | POA: Insufficient documentation

## 2017-07-30 DIAGNOSIS — Z590 Homelessness: Secondary | ICD-10-CM | POA: Insufficient documentation

## 2017-07-30 DIAGNOSIS — S90829A Blister (nonthermal), unspecified foot, initial encounter: Secondary | ICD-10-CM | POA: Insufficient documentation

## 2017-07-30 DIAGNOSIS — X58XXXA Exposure to other specified factors, initial encounter: Secondary | ICD-10-CM | POA: Insufficient documentation

## 2017-07-30 DIAGNOSIS — R112 Nausea with vomiting, unspecified: Secondary | ICD-10-CM | POA: Insufficient documentation

## 2017-07-30 DIAGNOSIS — Y939 Activity, unspecified: Secondary | ICD-10-CM | POA: Insufficient documentation

## 2017-07-30 DIAGNOSIS — Y999 Unspecified external cause status: Secondary | ICD-10-CM | POA: Insufficient documentation

## 2017-07-30 DIAGNOSIS — Y929 Unspecified place or not applicable: Secondary | ICD-10-CM | POA: Insufficient documentation

## 2017-07-30 DIAGNOSIS — R197 Diarrhea, unspecified: Secondary | ICD-10-CM | POA: Insufficient documentation

## 2017-07-30 MED ORDER — LOPERAMIDE HCL 2 MG PO CAPS: 4.0000 mg | ORAL_CAPSULE | Freq: Once | ORAL | Status: DC

## 2017-07-30 NOTE — ED Triage Notes (Signed)
Patient was brought to ed by ems with c/o SOB. Patient was given 5 mg of albuterol with no relief.

## 2017-07-30 NOTE — ED Notes (Signed)
Pt outside waiting on his boss to deliver him a key. Does not want to come in yet. Pt states if we discharge him he will just check back in.

## 2017-07-30 NOTE — ED Notes (Signed)
Pt is refusing blood work and CBG. Pt states that he does not need that done. He does not want to have any labs ran but he will provide a UA sample when he is able to use the restroom.

## 2017-07-30 NOTE — ED Notes (Signed)
Patient does not want to be treated. Patient went to the waiting room.

## 2017-07-30 NOTE — ED Notes (Signed)
Refused to come to room to be seen by doctor.

## 2017-07-30 NOTE — ED Notes (Signed)
Pt refusing to come in for treatment. Security talking with pt in lobby. Pt states he is calling a cab

## 2017-07-30 NOTE — ED Triage Notes (Signed)
Patient was picked up at [facility] by [facility]. Patient is complaining of abdominal pain. He got out of jail two days ago. Patient states he smoked a cigarette and thinks that is what started.

## 2017-07-30 NOTE — ED Notes (Signed)
Bed: WA20 Expected date:  Expected time:  Means of arrival:  Comments: 42 yo asthma exacerbation

## 2017-07-30 NOTE — ED Notes (Signed)
Pt said, "I don't want to go back to a room; I was trying to go to the other hospital."

## 2017-07-30 NOTE — ED Notes (Signed)
Pt left AMA without treatment. EDP to be notified.

## 2017-07-30 NOTE — ED Provider Notes (Signed)
[facility]-EMERGENCY DEPT Provider Note   CSN: [id number] Arrival date & time: 07/30/17  0137  Time seen 02:45 AM   History   Chief Complaint Chief Complaint  Patient presents with  . Abdominal Pain    HPI Dustin Norris is a 42 y.o. male.  HPI patient states he is having "a little bit of abdominal pain".  He states it started around lunchtime today and his discomfort is below the umbilicus.  He states he has had nausea and vomiting twice, he has had about 5 episodes of watery diarrhea.  He denies any nausea now.  History is hard to obtain because patient keeps wanting to talk about getting out of jail yesterday and he needs to go to work in the morning and he keeps stating "I am homeless and if he could just let me stay in the day room so my boss can pick me up in the morning.  That seems to be his main concern.  He states he is never had the abdominal pain before.  PCP Placey, Chales AbrahamsMary Ann, NP   Past Medical History:  Diagnosis Date  . Anxiety   . Asthma   . Cocaine abuse (HCC)   . Homelessness   . Migraine   . Tobacco abuse     Patient Active Problem List   Diagnosis Date Noted  . 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 Cocaine use disorder, moderate, dependence (HCC) 07/26/2016  . 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 Psychosis (HCC) 07/24/2016  . Chest pain 11/23/2015  . Asthma 11/23/2015  . Tobacco abuse 11/23/2015  . Elevated lactic acid level 11/23/2015  . Cocaine abuse (HCC)   . Concern about STD in male without diagnosis   . Paranoid ideation (HCC) 02/22/2015    No past surgical history on file.      Home Medications    Prior to Admission medications   Medication Sig Start Date End Date Taking? Authorizing Provider  acetaminophen (TYLENOL) 325 MG tablet Take 650 mg by mouth every 6 (six) hours as needed for moderate pain.    [provider]  albuterol  (PROVENTIL HFA;VENTOLIN HFA) 108 (90 Base) MCG/ACT inhaler Inhale 1-2 puffs into the lungs every 6 (six) hours as needed for wheezing or shortness of breath. 05/14/17   Khatri, Hina, PA-C  naproxen (NAPROSYN) 500 MG tablet Take 1 tablet (500 mg total) by mouth 2 (two) times daily. 05/13/17   Rolland PorterJames, Mark, MD  nystatin (MYCOSTATIN/NYSTOP) powder Apply topically 4 (four) times daily. 04/21/17   Roxy HorsemanBrowning, Robert, PA-C  terbinafine (LAMISIL AT) 1 % cream Apply 1 application topically 2 (two) times daily. 05/07/17   Ward, Layla MawKristen N, DO    Family History No family history on file.  Social History Social History   Tobacco Use  . Smoking status: Current Every Day Smoker    Packs/day: 1.00    Years: 0.00    Pack years: 0.00    Types: Cigarettes, Cigars  . Smokeless tobacco: Never Used  Substance Use Topics  . Alcohol use: Not Currently    Frequency: Never  . Drug use: Not Currently  homeless   Allergies   Haldol [haloperidol lactate]   Review of Systems Review of Systems  All other systems reviewed and are negative.    Physical Exam Updated Vital Signs BP (!) 153/98 (BP Location: Left Arm)   Pulse (!) 119   Temp 98.6 F (37 C) (Oral)   Resp 16  Ht 5\' 7"  (1.702 m)   Wt 68 kg (150 lb)   SpO2 100%   BMI 23.49 kg/m   Vital signs normal except for tachycardia and hypertension   Physical Exam  Constitutional: He is oriented to person, place, and time. He appears well-developed and well-nourished.  Non-toxic appearance. He does not appear ill. No distress.  HENT:  Head: Normocephalic and atraumatic.  Right Ear: External ear normal.  Left Ear: External ear normal.  Nose: Nose normal. No mucosal edema or rhinorrhea.  Mouth/Throat: Oropharynx is clear and moist and mucous membranes are normal. No dental abscesses or uvula swelling.  Eyes: Pupils are equal, round, and reactive to light. Conjunctivae and EOM are normal.  Neck: Normal range of motion and full passive range of motion  without pain. Neck supple.  Cardiovascular: Normal rate, regular rhythm and normal heart sounds. Exam reveals no gallop and no friction rub.  No murmur heard. Pulmonary/Chest: Effort normal and breath sounds normal. No respiratory distress. He has no wheezes. He has no rhonchi. He has no rales. He exhibits no tenderness and no crepitus.  Abdominal: Soft. Normal appearance and bowel sounds are normal. He exhibits no distension. There is no tenderness. There is no rebound and no guarding.  Musculoskeletal: Normal range of motion. He exhibits no edema or tenderness.  Moves all extremities well.   Neurological: He is alert and oriented to person, place, and time. He has normal strength. No cranial nerve deficit.  Skin: Skin is warm, dry and intact. No rash noted. No erythema. No pallor.  Psychiatric: He has a normal mood and affect. His speech is normal and behavior is normal. His mood appears not anxious.  Nursing note and vitals reviewed.    ED Treatments / Results  Labs (all labs ordered are listed, but only abnormal results are displayed)  No results found.   EKG None  Radiology No results found.  Procedures Procedures (including critical care time)  Medications Ordered in ED Medications  loperamide (IMODIUM) capsule 4 mg (4 mg Oral Refused 07/30/17 0303)     Initial Impression / Assessment and Plan / ED Course  I have reviewed the triage vital signs and the nursing notes.  Pertinent labs & imaging results that were available during my care of the patient were reviewed by me and considered in my medical decision making (see chart for details).     I ordered Imodium for his diarrhea.  Nurses report patient left about 3 AM.  Final Clinical Impressions(s) / ED Diagnoses   Final diagnoses:  Diarrhea, unspecified type  Lower abdominal pain    Pt left AMA  Devoria Albe, MD, Concha Pyo, MD 07/30/17 [phone]

## 2017-07-30 NOTE — ED Notes (Signed)
Pt was offered to get a medical screening for his chief complaint, but he would rather be out in the lobby to sleep.

## 2017-07-30 NOTE — ED Triage Notes (Signed)
Seen here and keeps leaving and now back for blisters on his feet.

## 2017-07-30 NOTE — ED Notes (Signed)
Patient refusing care and got up and left.

## 2017-07-30 NOTE — ED Triage Notes (Addendum)
Pt c/o abd, no other symptoms, pt states he left this morning to go to work and called for EMS due to stomach hurting. Pt has dilated pupils with blood shot eyes. Did not want to talk to EMS when trying to asses.

## 2017-07-30 NOTE — ED Notes (Signed)
Pt aware urine sample is needed 

## 2017-07-30 NOTE — ED Notes (Signed)
Pt informs me that he will be out in the waiting area and will take his paperwork once it is ready

## 2017-08-01 ENCOUNTER — Emergency Department (HOSPITAL_COMMUNITY)
Admission: EM | Admit: 2017-08-01 | Discharge: 2017-08-01 | Disposition: A | Discharge status: Home / Self Care | Payer: Self-pay | Attending: Emergency Medicine | Admitting: Emergency Medicine

## 2017-08-01 DIAGNOSIS — E860 Dehydration: Secondary | ICD-10-CM | POA: Insufficient documentation

## 2017-08-01 DIAGNOSIS — F17210 Nicotine dependence, cigarettes, uncomplicated: Secondary | ICD-10-CM | POA: Insufficient documentation

## 2017-08-01 DIAGNOSIS — R197 Diarrhea, unspecified: Secondary | ICD-10-CM | POA: Insufficient documentation

## 2017-08-01 DIAGNOSIS — Z79899 Other long term (current) drug therapy: Secondary | ICD-10-CM | POA: Insufficient documentation

## 2017-08-01 DIAGNOSIS — J45909 Unspecified asthma, uncomplicated: Secondary | ICD-10-CM | POA: Insufficient documentation

## 2017-08-01 DIAGNOSIS — N179 Acute kidney failure, unspecified: Secondary | ICD-10-CM | POA: Insufficient documentation

## 2017-08-01 LAB — CBC WITH DIFFERENTIAL/PLATELET
Abs Immature Granulocytes: 0.1 10*3/uL (ref 0.0–0.1)
BASOS ABS: 0 10*3/uL (ref 0.0–0.1)
BASOS PCT: 0 %
EOS PCT: 0 %
Eosinophils Absolute: 0 10*3/uL (ref 0.0–0.7)
HCT: 46.9 % (ref 39.0–52.0)
Hemoglobin: 14.9 g/dL (ref 13.0–17.0)
Immature Granulocytes: 1 %
Lymphocytes Relative: 11 %
Lymphs Abs: 1.1 10*3/uL (ref 0.7–4.0)
MCH: 28.4 pg (ref 26.0–34.0)
MCHC: 31.8 g/dL (ref 30.0–36.0)
MCV: 89.3 fL (ref 78.0–100.0)
MONO ABS: 0.7 10*3/uL (ref 0.1–1.0)
Monocytes Relative: 7 %
NEUTROS ABS: 8 10*3/uL — AB (ref 1.7–7.7)
Neutrophils Relative %: 81 %
PLATELETS: 244 10*3/uL (ref 150–400)
RBC: 5.25 MIL/uL (ref 4.22–5.81)
RDW: 12.8 % (ref 11.5–15.5)
WBC: 9.9 10*3/uL (ref 4.0–10.5)

## 2017-08-01 LAB — URINALYSIS, ROUTINE W REFLEX MICROSCOPIC
Bilirubin Urine: NEGATIVE
GLUCOSE, UA: NEGATIVE mg/dL
Hgb urine dipstick: NEGATIVE
Ketones, ur: NEGATIVE mg/dL
LEUKOCYTES UA: NEGATIVE
NITRITE: NEGATIVE
PH: 6 (ref 5.0–8.0)
Protein, ur: 30 mg/dL — AB
SPECIFIC GRAVITY, URINE: 1.01 (ref 1.005–1.030)

## 2017-08-01 LAB — COMPREHENSIVE METABOLIC PANEL
ALT: 18 U/L (ref 0–44)
ANION GAP: 22 — AB (ref 5–15)
AST: 38 U/L (ref 15–41)
Albumin: 4.5 g/dL (ref 3.5–5.0)
Alkaline Phosphatase: 64 U/L (ref 38–126)
BUN: 24 mg/dL — ABNORMAL HIGH (ref 6–20)
CHLORIDE: 102 mmol/L (ref 98–111)
CO2: 13 mmol/L — ABNORMAL LOW (ref 22–32)
Calcium: 9.6 mg/dL (ref 8.9–10.3)
Creatinine, Ser: 1.67 mg/dL — ABNORMAL HIGH (ref 0.61–1.24)
GFR calc non Af Amer: 49 mL/min — ABNORMAL LOW (ref >60)
GFR, EST AFRICAN AMERICAN: 57 mL/min — AB (ref >60)
Glucose, Bld: 202 mg/dL — ABNORMAL HIGH (ref 70–99)
POTASSIUM: 3.6 mmol/L (ref 3.5–5.1)
SODIUM: 137 mmol/L (ref 135–145)
TOTAL PROTEIN: 7.4 g/dL (ref 6.5–8.1)
Total Bilirubin: 0.6 mg/dL (ref 0.3–1.2)

## 2017-08-01 LAB — MAGNESIUM: Magnesium: 4.1 mg/dL — ABNORMAL HIGH (ref 1.7–2.4)

## 2017-08-01 LAB — LIPASE, BLOOD: LIPASE: 27 U/L (ref 11–51)

## 2017-08-01 MED ORDER — SODIUM CHLORIDE 0.9 % IV BOLUS
1000.0000 mL | Freq: Once | INTRAVENOUS | Status: AC
  Administered 2017-08-01: 1000 mL via INTRAVENOUS

## 2017-08-01 NOTE — Progress Notes (Addendum)
CSW received consult about pt getting a greyhound ticket back to [place]. Pt stated he can stay with his aunt at [address], [place], [place]. Pt stated he is able to stay with his aunt. Pt became tearful multiple times throughout the conversation, stating that he just wants to go home.   CSW Armed forces operational officercalled Director, [employer] Rife. Director approved transportation back to [place]. CSW purchased Greyhound ticket from [place] to [place]. CSW explained to pt that bus does not leave until tomorrow at 3 pm. Pt stated that is fine and he will find someplace to stay until then.   Pt has a ID with him. Pt showed ID to CSW. Ticket paid for by [employer] Work[employer]. CSW informed pt that ticket will be at bus station at will call. Pt voiced understanding that ticket will have to be picked up at bus station.   CSW provided pt with confirmation number for bus ticket.   Montine CircleKelsy Chaniqua Brisby, Silverio LayLCSWA [facility]  [phone]

## 2017-08-01 NOTE — Discharge Instructions (Signed)
You were found to be dehydrated after your diarrhea episodes for the last several days.  Your kidney function was elevated.  After all of the fluids, we feel you are safe for discharge home.  Please follow-up with your primary doctor and return to the emergency department if any symptoms change or worsen.
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
## 2017-08-01 NOTE — ED Notes (Signed)
Got patient urine sample patient is resting with call bell in reach °

## 2017-08-01 NOTE — ED Notes (Signed)
Social work at the bedside.

## 2017-08-01 NOTE — ED Notes (Signed)
Pt unable to provide urine sample at this time.  Given urinal.
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
## 2017-08-01 NOTE — ED Provider Notes (Signed)
[facility] EMERGENCY DEPARTMENT Provider Note   CSN: [id number] Arrival date & time: 08/01/17  [id number]     History   Chief Complaint Chief Complaint  Patient presents with  . Abdominal Pain    HPI Dustin Norris is a 42 y.o. male.  The history is provided Norris the patient and medical records. No language interpreter was used.  Diarrhea   This is a new problem. The current episode started more than 2 days ago. The problem occurs continuously. The problem has not changed since onset.The stool consistency is described as watery. There has been no fever. Pertinent negatives include no abdominal pain, no vomiting, no chills, no sweats, no headaches, no arthralgias, no myalgias, no URI and no cough. He has tried nothing for the symptoms. The treatment provided no relief.    Past Medical History:  Diagnosis Date  . Anxiety   . Asthma   . Cocaine abuse (HCC)   . Homelessness   . Migraine   . Tobacco abuse     Patient Active Problem List   Diagnosis Date Noted  . 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 Cocaine use disorder, moderate, dependence (HCC) 07/26/2016  . 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 Psychosis (HCC) 07/24/2016  . Chest pain 11/23/2015  . Asthma 11/23/2015  . Tobacco abuse 11/23/2015  . Elevated lactic acid level 11/23/2015  . Cocaine abuse (HCC)   . Concern about STD in male without diagnosis   . Paranoid ideation (HCC) 02/22/2015    No past surgical history on file.      Home Medications    Prior to Admission medications   Medication Sig Start Date End Date Taking? Authorizing Provider  acetaminophen (TYLENOL) 325 MG tablet Take 650 mg Norris mouth every 6 (six) hours as needed for moderate pain.    [provider]  albuterol (PROVENTIL HFA;VENTOLIN HFA) 108 (90 Base) MCG/ACT inhaler Inhale 1-2 puffs into the lungs every 6 (six) hours as needed for wheezing or  shortness of breath. 05/14/17   Khatri, Hina, PA-C  naproxen (NAPROSYN) 500 MG tablet Take 1 tablet (500 mg total) Norris mouth 2 (two) times daily. 05/13/17   Rolland PorterJames, Mark, MD  nystatin (MYCOSTATIN/NYSTOP) powder Apply topically 4 (four) times daily. 04/21/17   Roxy HorsemanBrowning, Robert, PA-C  terbinafine (LAMISIL AT) 1 % cream Apply 1 application topically 2 (two) times daily. 05/07/17   Ward, Layla MawKristen N, DO    Family History No family history on file.  Social History Social History   Tobacco Use  . Smoking status: Current Every Day Smoker    Packs/day: 1.00    Years: 0.00    Pack years: 0.00    Types: Cigarettes, Cigars  . Smokeless tobacco: Never Used  Substance Use Topics  . Alcohol use: Not Currently    Frequency: Never  . Drug use: Not Currently     Allergies   Haldol [haloperidol lactate]   Review of Systems Review of Systems  Constitutional: Positive for fatigue. Negative for chills, diaphoresis and fever.  HENT: Negative for congestion.   Respiratory: Negative for cough, chest tightness, shortness of breath, wheezing and stridor.   Gastrointestinal: Positive for diarrhea and nausea. Negative for abdominal pain and vomiting.  Genitourinary: Negative for dysuria, flank pain and frequency.  Musculoskeletal: Negative for arthralgias, back pain, myalgias and neck stiffness.  Neurological: Negative for light-headedness, numbness and headaches.  Psychiatric/Behavioral: Negative for agitation and confusion.  All other systems reviewed and  are negative.    Physical Exam Updated Vital Signs BP 98/63 (BP Location: Right Arm)   Pulse (!) 112   Temp (!) 97.5 F (36.4 C) (Oral)   Resp 18   SpO2 97%   Physical Exam  Constitutional: He appears well-developed and well-nourished.  Non-toxic appearance. He does not appear ill. No distress.  HENT:  Head: Normocephalic and atraumatic.  Mouth/Throat: Oropharynx is clear and moist. No oropharyngeal exudate.  Eyes: Pupils are equal, round,  and reactive to light. Conjunctivae and EOM are normal. No scleral icterus.  Neck: Neck supple.  Cardiovascular: Regular rhythm, normal heart sounds and intact distal pulses. Tachycardia present.  No murmur heard. Pulmonary/Chest: Effort normal and breath sounds normal. No respiratory distress.  Abdominal: Soft. Normal appearance and bowel sounds are normal. There is no tenderness. There is no rigidity, no rebound, no guarding and no CVA tenderness.  Musculoskeletal: He exhibits no edema.  Neurological: He is alert.  Skin: Skin is warm and dry. Capillary refill takes less than 2 seconds. No rash noted. No erythema. No pallor.  Psychiatric: He has a normal mood and affect.  Nursing note and vitals reviewed.    ED Treatments / Results  Labs (all labs ordered are listed, but only abnormal results are displayed) Labs Reviewed  CBC WITH DIFFERENTIAL/PLATELET - Abnormal; Notable for the following components:      Result Value   Neutro Abs 8.0 (*)    All other components within normal limits  COMPREHENSIVE METABOLIC PANEL - Abnormal; Notable for the following components:   CO2 13 (*)    Glucose, Bld 202 (*)    BUN 24 (*)    Creatinine, Ser 1.67 (*)    GFR calc non Af Amer 49 (*)    GFR calc Af Amer 57 (*)    Anion gap 22 (*)    All other components within normal limits  MAGNESIUM - Abnormal; Notable for the following components:   Magnesium 4.1 (*)    All other components within normal limits  URINALYSIS, ROUTINE W REFLEX MICROSCOPIC - Abnormal; Notable for the following components:   APPearance HAZY (*)    Protein, ur 30 (*)    Bacteria, UA RARE (*)    All other components within normal limits  URINE CULTURE  LIPASE, BLOOD    EKG EKG Interpretation  Date/Time:  Monday August 01 2017 07:01:29 EDT Ventricular Rate:  110 PR Interval:    QRS Duration: 97 QT Interval:  384 QTC Calculation: 520 R Axis:   82 Text Interpretation:  Sinus tachycardia RSR' in V1 or V2, probably  normal variant Prolonged QT interval When compared to prior, faster rate and longer Qtc.  No STEMI Confirmed Norris Theda Belfast ([id number]) on 08/01/2017 7:06:59 AM   Radiology No results found.  Procedures Procedures (including critical care time)  Medications Ordered in ED Medications  sodium chloride 0.9 % bolus 1,000 mL (0 mLs Intravenous Stopped 08/01/17 1135)  sodium chloride 0.9 % bolus 1,000 mL (0 mLs Intravenous Stopped 08/01/17 1135)  sodium chloride 0.9 % bolus 1,000 mL (0 mLs Intravenous Stopped 08/01/17 1558)     Initial Impression / Assessment and Plan / ED Course  I have reviewed the triage vital signs and the nursing notes.  Pertinent labs & imaging results that were available during my care of the patient were reviewed Norris me and considered in my medical decision making (see chart for details).     Ezell Poke is a 42 y.o. male with  a past medical history significant for homelessness, polysubstance abuse, asthma, anxiety, and migraines who presents for fatigue, generalized weakness, nausea, and constant diarrhea.  Patient reports that he has had diarrhea for the last week.  He reports that he was seen several days ago and had a reassuring work-up for abdominal pain with diarrhea.  He reports that he is no longer having abdominal pain but he is having diarrhea.  He says that he is feeling very weak and fatigued.  He reports feeling very dehydrated and his mouth feels dry.  He denies fevers, chills, chest pain, shortness of breath, constipation, or urinary symptoms.  He denies any drug or alcohol use.  He denies any other complaints on arrival other than feeling "tired".  Patient denies any trauma.  He reports only taking 2 Tylenol pills in the last 24 hours.  On arrival, patient was tachycardic.  Patient's blood pressure was in the low 100s.  Patient's mucous membranes appear dry and his lips are dry.  Patient's lungs were clear and abdomen was nontender.  Chest is nontender.   Patient resting calmly in the bed.  Patient's EKG showed a sinus tachycardia with prolonged QTC.  This will somewhat limit nausea medications initially.  Patient will be rehydrated with fluids and have laboratory testing checked to look for dehydration or electrode abnormalities in the setting of his diarrhea.  Anticipate reassessment after work-up.  Patient's laboratory testing was only significant for elevated creatinine.  It appears to have an acute kidney injury from prior.  This is likely due to the patient's dehydration from his diarrhea.  Patient was given 3 L of fluid and began having improved symptoms.  He was able to tolerate eating a sandwich and drinking fluids without difficulty.  Given his prolonged QTC I do not feel comfortable giving patient nausea medications.  Given improved symptoms I feel patient is safe for discharge home.  Patient was given extremity strict return precautions given the elevated creatinine.  With his tolerance of p.o. and his lack of diarrhea I do not suspect his kidney function will continue to worsen.  He was instructed to return if anything did worsen as well as follow-up with a PCP.  Magnesium was also elevated on work-up.  Care coordination team was consulted to assist patient in transportation as he wants to go back to [place].  4:24 PM Patient still feels fatigued and feels his legs are swollen.  He is also concerned about the AKI and is feeling agitated.  He is feeling depressed and concerned.    Patient will have repeat blood work performed to look for improvement in his AKI with a repeat BMP, he will have a CK checked, and he will also have a B NP with his leg swelling bilaterally.    TTS consult was placed for his reported depression.  4:26 PM On reassessment, patient now does not want to have TTS evaluation and is not interested in having repeat blood work.  He would rather be discharged and have his IV removed. We offered further workup and he  refused.   Given patient's improvement in symptoms, patient will be discharged.  Patient understood return precautions and follow-up instructions.   Final Clinical Impressions(s) / ED Diagnoses   Final diagnoses:  Diarrhea, unspecified type  AKI (acute kidney injury) (HCC)  Hypermagnesemia  Dehydration    ED Discharge Orders    None      Clinical Impression: 1. Diarrhea, unspecified type   2. AKI (acute kidney  injury) (HCC)   3. Hypermagnesemia   4. Dehydration     Disposition: Discharge  Condition: Good  I have discussed the results, Dx and Tx plan with the pt(& family if present). He/she/they expressed understanding and agree(s) with the plan. Discharge instructions discussed at great length. Strict return precautions discussed and pt &/or family have verbalized understanding of the instructions. No further questions at time of discharge.    New Prescriptions   No medications on file    Follow Up: Lavinia Sharps, NP [address] [place] [address] [phone]     [facility] EMERGENCY DEPARTMENT [address] [id number] mc [place] [address] [phone]       Ishani Goldwasser, Canary Brim, MD 08/02/17 1400
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
## 2017-08-01 NOTE — ED Notes (Signed)
Pt successfully got from wheelchair to bed without assistance.

## 2017-08-02 LAB — URINE CULTURE: Culture: <10000 — AB

## 2017-11-07 IMAGING — CR DG CHEST 2V
2 series · 2 of 2 positions shown · non-contrast
Comparison: CT chest November 23, 2015 at 0027 hours

CLINICAL DATA: Mid chest pain beginning yesterday, shortness of
breath. History of asthma.

EXAM:
CHEST  2 VIEW

[chest pa]
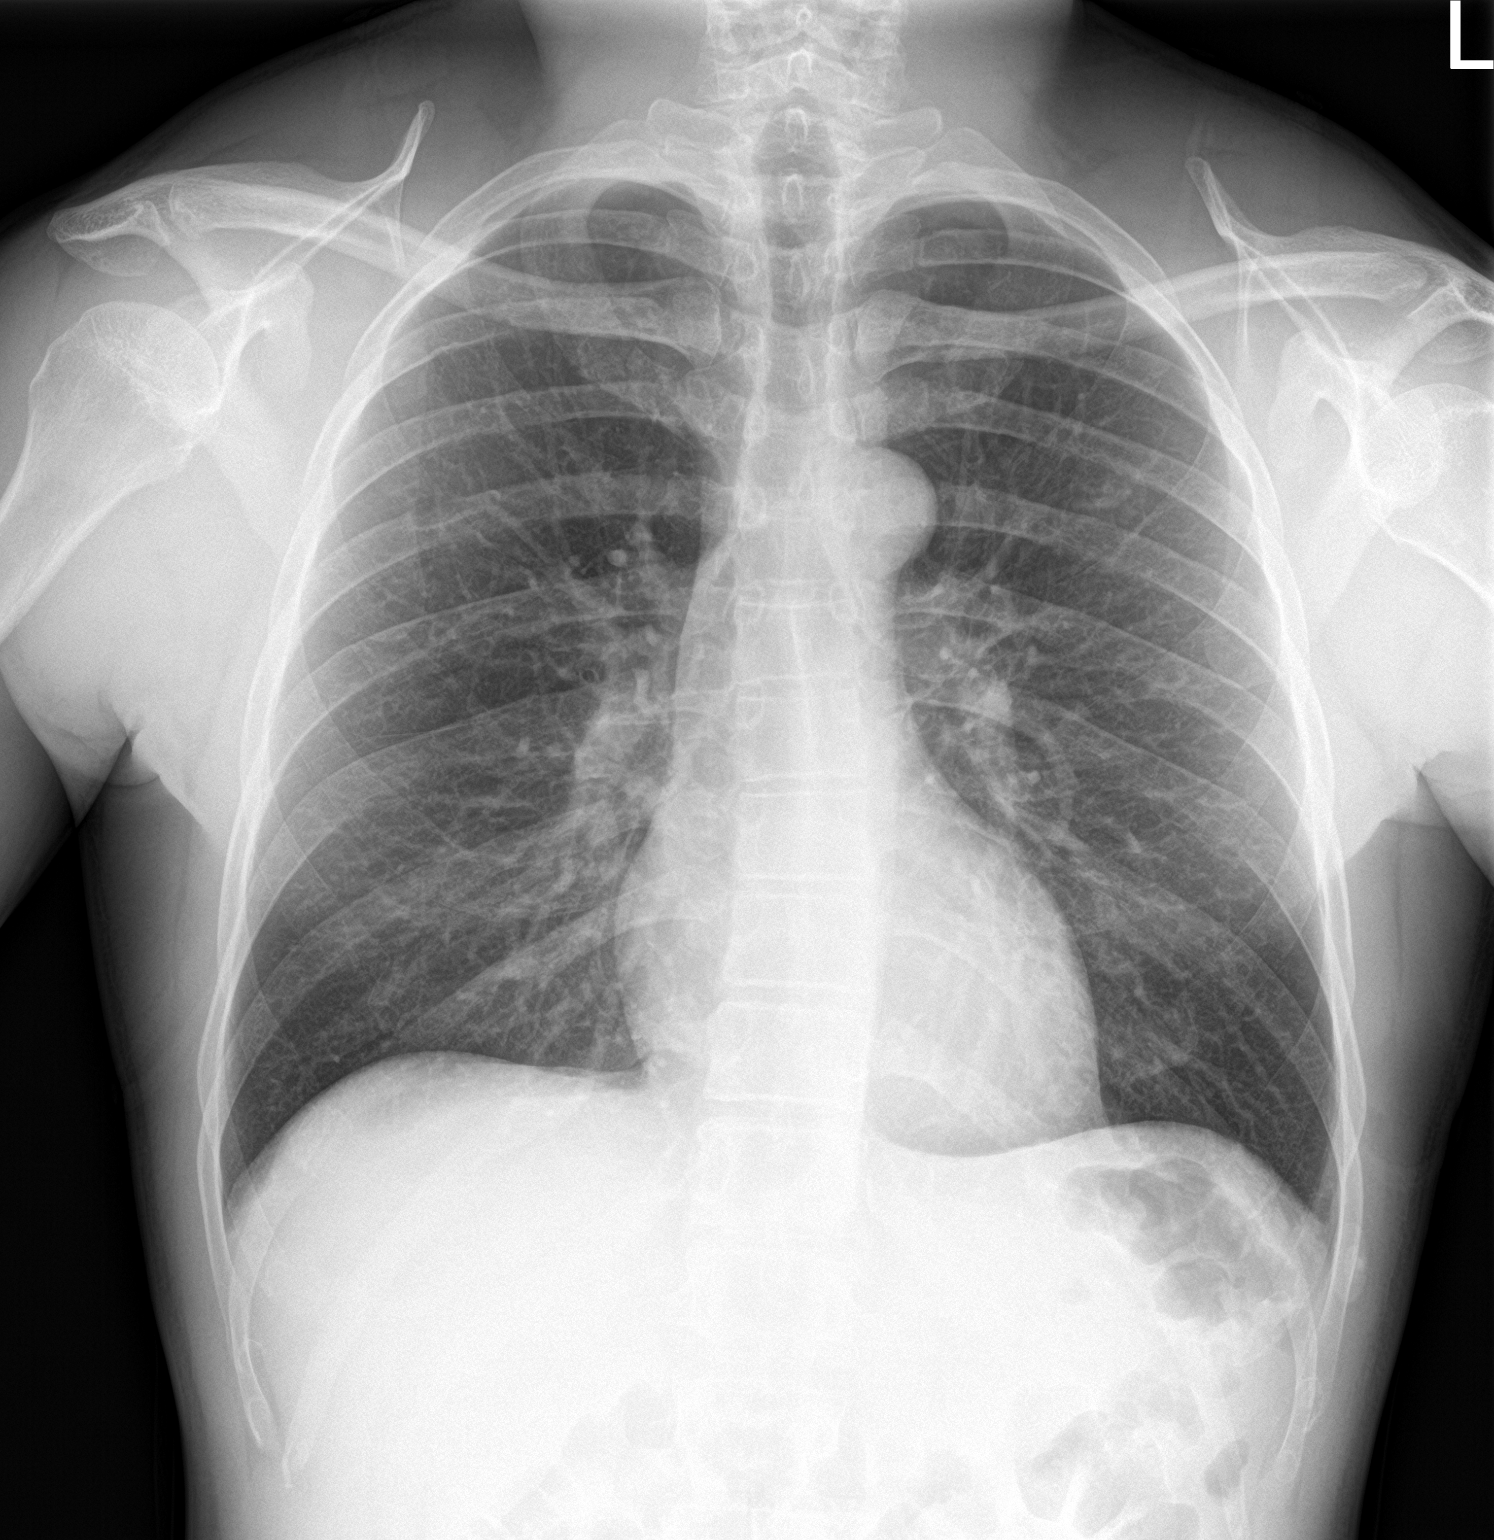

[chest lat]
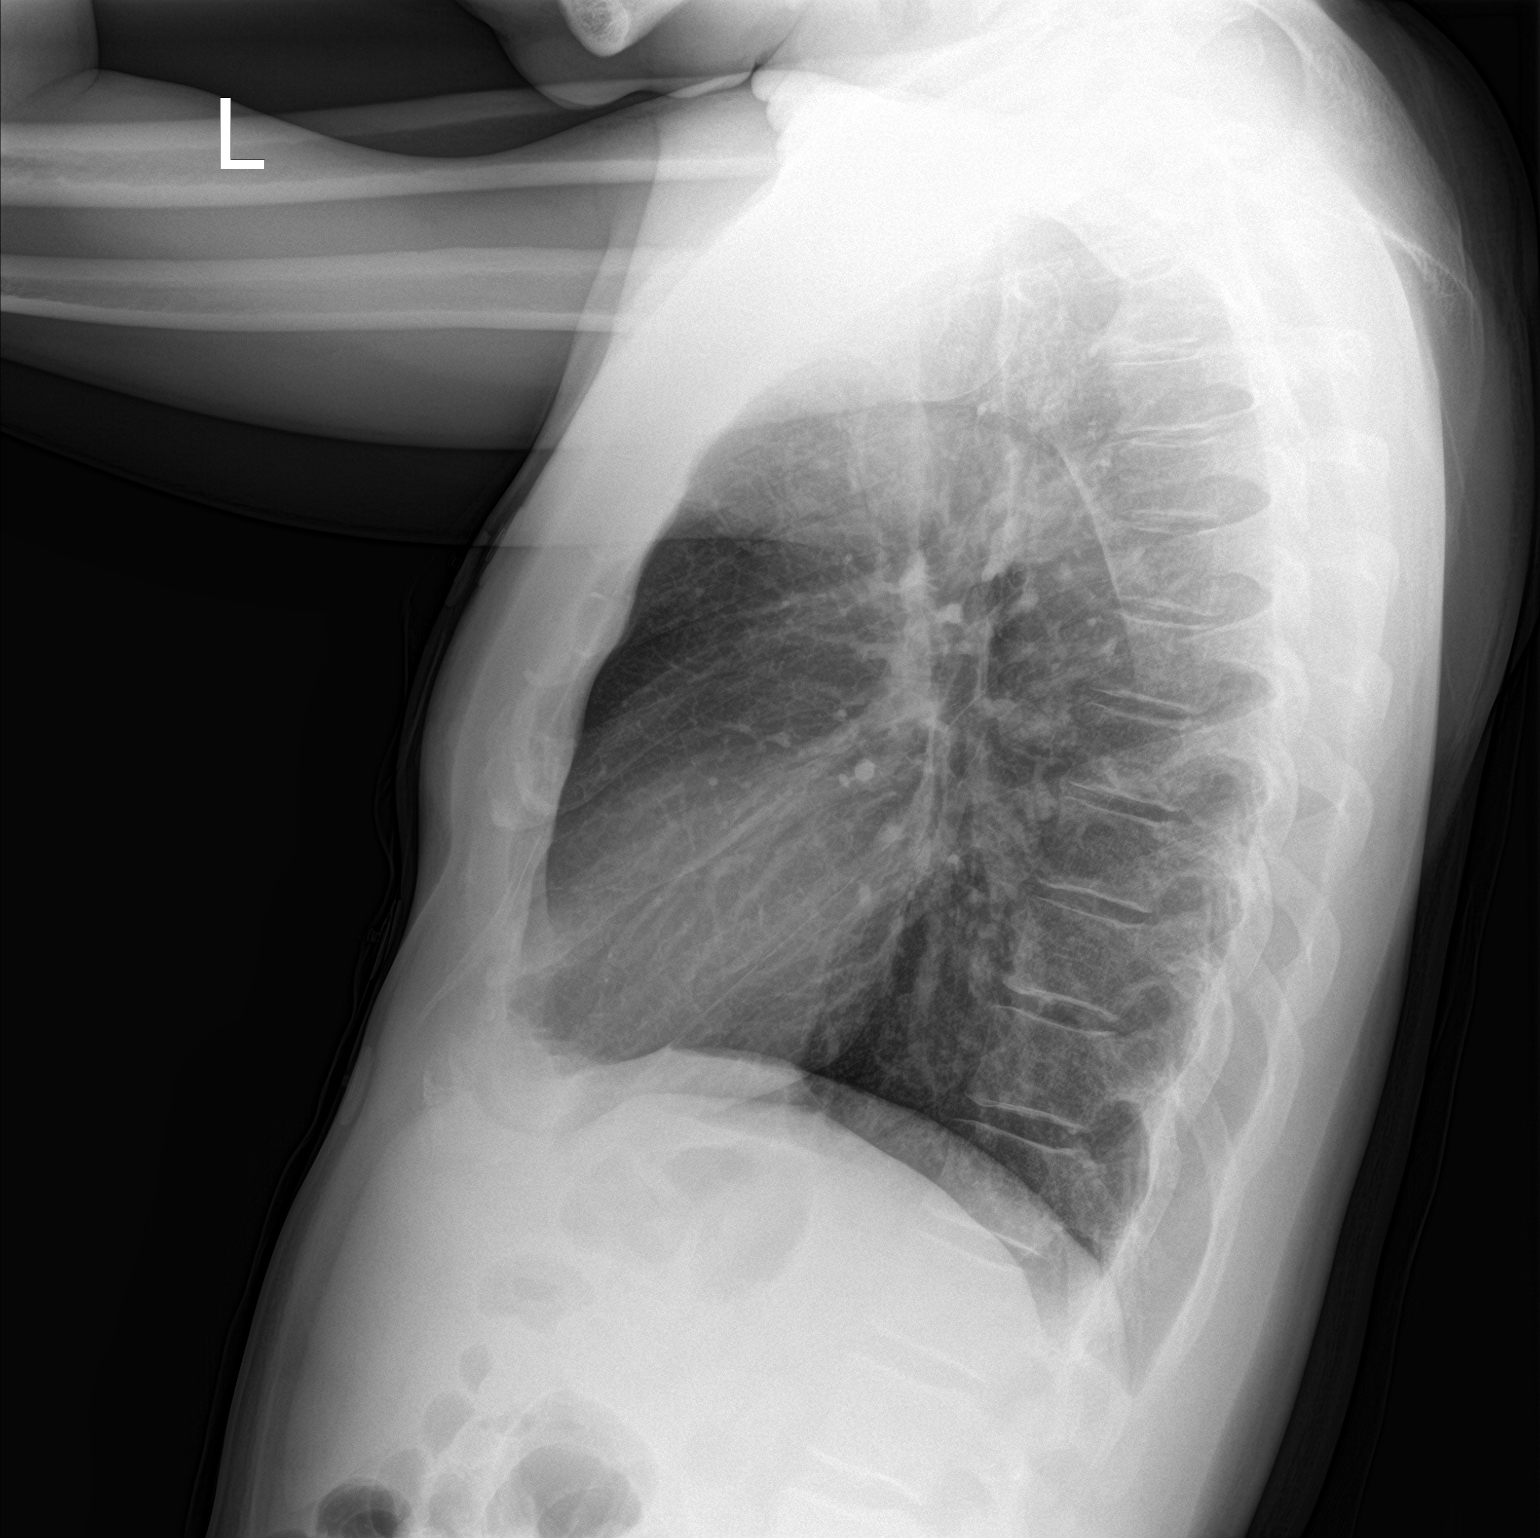

[2 of 2 positions shown; findings below may reference images not displayed]

FINDINGS: Cardiomediastinal silhouette is normal. No pleural effusions or
focal consolidations. Trachea projects midline and there is no
pneumothorax. Soft tissue planes and included osseous structures are
non-suspicious.
IMPRESSION: No acute cardiopulmonary process.

## 2018-02-23 ENCOUNTER — Encounter (HOSPITAL_COMMUNITY): Payer: Self-pay | Admitting: *Deleted

## 2018-02-23 ENCOUNTER — Other Ambulatory Visit: Payer: Self-pay

## 2018-02-23 ENCOUNTER — Emergency Department (HOSPITAL_COMMUNITY)
Admission: EM | Admit: 2018-02-23 | Discharge: 2018-02-24 | Disposition: A | Discharge status: Home / Self Care | Payer: Self-pay | Attending: Emergency Medicine | Admitting: Emergency Medicine

## 2018-02-23 DIAGNOSIS — R519 Headache, unspecified: Secondary | ICD-10-CM

## 2018-02-23 DIAGNOSIS — Z79899 Other long term (current) drug therapy: Secondary | ICD-10-CM | POA: Insufficient documentation

## 2018-02-23 DIAGNOSIS — F17210 Nicotine dependence, cigarettes, uncomplicated: Secondary | ICD-10-CM | POA: Insufficient documentation

## 2018-02-23 DIAGNOSIS — J45909 Unspecified asthma, uncomplicated: Secondary | ICD-10-CM | POA: Insufficient documentation

## 2018-02-23 DIAGNOSIS — R51 Headache: Secondary | ICD-10-CM | POA: Insufficient documentation

## 2018-02-23 NOTE — ED Triage Notes (Addendum)
Pt reports "migraine" since today, left sided headache, sensitivity to light. No n/v/dizziness. Took tylenol for the same, no relief.

## 2018-02-24 ENCOUNTER — Emergency Department (HOSPITAL_COMMUNITY)
Admission: EM | Admit: 2018-02-24 | Discharge: 2018-02-24 | Disposition: A | Discharge status: Home / Self Care | Payer: Self-pay | Attending: Emergency Medicine | Admitting: Emergency Medicine

## 2018-02-24 ENCOUNTER — Encounter (HOSPITAL_COMMUNITY): Payer: Self-pay

## 2018-02-24 ENCOUNTER — Other Ambulatory Visit: Payer: Self-pay

## 2018-02-24 DIAGNOSIS — F17210 Nicotine dependence, cigarettes, uncomplicated: Secondary | ICD-10-CM | POA: Insufficient documentation

## 2018-02-24 DIAGNOSIS — J45909 Unspecified asthma, uncomplicated: Secondary | ICD-10-CM | POA: Insufficient documentation

## 2018-02-24 DIAGNOSIS — M79604 Pain in right leg: Secondary | ICD-10-CM | POA: Insufficient documentation

## 2018-02-24 DIAGNOSIS — R51 Headache: Secondary | ICD-10-CM | POA: Insufficient documentation

## 2018-02-24 DIAGNOSIS — Z5321 Procedure and treatment not carried out due to patient leaving prior to being seen by health care provider: Secondary | ICD-10-CM | POA: Insufficient documentation

## 2018-02-24 MED ORDER — ACETAMINOPHEN 325 MG PO TABS
650.0000 mg | ORAL_TABLET | Freq: Once | ORAL | Status: AC
  Administered 2018-02-24: 650 mg via ORAL
  Filled 2018-02-24: qty 2

## 2018-02-24 MED ORDER — IBUPROFEN 400 MG PO TABS
400.0000 mg | ORAL_TABLET | Freq: Once | ORAL | Status: AC
  Administered 2018-02-24: 400 mg via ORAL
  Filled 2018-02-24: qty 1

## 2018-02-24 NOTE — ED Notes (Signed)
Called pt name x3 to be roomed. No response from pt. Arlys John, RN stated pt was seen walking out.

## 2018-02-24 NOTE — ED Provider Notes (Signed)
[facility] EMERGENCY DEPARTMENT Provider Note   CSN: [id number] Arrival date & time: 02/23/18  2249     History   Chief Complaint Chief Complaint  Patient presents with  . Headache    HPI Dustin Norris is a 43 y.o. male.  The history is provided by the patient.  He has history of asthma, cocaine abuse, anxiety and comes in complaining of a headache which he feels is a migraine.  Headache is been present all day is bitemporal in location.  He describes a throbbing pain which he rates a 10/10.  There is associated photophobia but he denies nausea or vomiting.  He denies visual change.  There is no numbness or weakness.  He has not taken anything for it.  Past Medical History:  Diagnosis Date  . Anxiety   . Asthma   . Cocaine abuse (HCC)   . Homelessness   . Migraine   . Tobacco abuse     Patient Active Problem List   Diagnosis Date Noted  . 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 Cocaine use disorder, moderate, dependence (HCC) 07/26/2016  . 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 Psychosis (HCC) 07/24/2016  . Chest pain 11/23/2015  . Asthma 11/23/2015  . Tobacco abuse 11/23/2015  . Elevated lactic acid level 11/23/2015  . Cocaine abuse (HCC)   . Concern about STD in male without diagnosis   . Paranoid ideation (HCC) 02/22/2015    History reviewed. No pertinent surgical history.      Home Medications    Prior to Admission medications   Medication Sig Start Date End Date Taking? Authorizing Provider  acetaminophen (TYLENOL) 325 MG tablet Take 650 mg by mouth every 6 (six) hours as needed for moderate pain.    [provider]  albuterol (PROVENTIL HFA;VENTOLIN HFA) 108 (90 Base) MCG/ACT inhaler Inhale 1-2 puffs into the lungs every 6 (six) hours as needed for wheezing or shortness of breath. 05/14/17   Khatri, Hina, PA-C  naproxen (NAPROSYN) 500 MG tablet Take 1 tablet  (500 mg total) by mouth 2 (two) times daily. 05/13/17   Rolland Porter, MD  nystatin (MYCOSTATIN/NYSTOP) powder Apply topically 4 (four) times daily. 04/21/17   Roxy Horseman, PA-C  terbinafine (LAMISIL AT) 1 % cream Apply 1 application topically 2 (two) times daily. 05/07/17   Ward, Layla Maw, DO    Family History No family history on file.  Social History Social History   Tobacco Use  . Smoking status: Current Every Day Smoker    Packs/day: 1.00    Years: 0.00    Pack years: 0.00    Types: Cigarettes, Cigars  . Smokeless tobacco: Never Used  Substance Use Topics  . Alcohol use: Not Currently    Frequency: Never  . Drug use: Not Currently     Allergies   Haldol [haloperidol lactate]   Review of Systems Review of Systems  All other systems reviewed and are negative.    Physical Exam Updated Vital Signs BP 121/82   Pulse 85   Temp 98.2 F (36.8 C) (Oral)   Resp 18   SpO2 97%   Physical Exam Vitals signs and nursing note reviewed.    43 year old male, resting comfortably and in no acute distress. Vital signs are normal. Oxygen saturation is 97%, which is normal. Head is normocephalic and atraumatic. PERRLA, EOMI. Oropharynx is clear.  There is tenderness to palpation over the temporalis muscle bilaterally. Neck is  nontender and supple without adenopathy or JVD. Back is nontender and there is no CVA tenderness. Lungs are clear without rales, wheezes, or rhonchi. Chest is nontender. Heart has regular rate and rhythm without murmur. Abdomen is soft, flat, nontender without masses or hepatosplenomegaly and peristalsis is normoactive. Extremities have no cyanosis or edema, full range of motion is present. Skin is warm and dry without rash. Neurologic: Mental status is normal, cranial nerves are intact, there are no motor or sensory deficits.  ED Treatments / Results   Procedures Procedures  Medications Ordered in ED Medications  acetaminophen (TYLENOL) tablet  650 mg (650 mg Oral Given 02/24/18 0513)  ibuprofen (ADVIL,MOTRIN) tablet 400 mg (400 mg Oral Given 02/24/18 0511)     Initial Impression / Assessment and Plan / ED Course  I have reviewed the triage vital signs and the nursing notes.  Headache which is probably muscle contraction headache.  No red flags to suggest more serious causes of headache.  Old records are reviewed, and he has numerous ED visits for similar headaches, usually treated with acetaminophen.  He is given a dose of acetaminophen and ibuprofen and will be observed for clinical response.  He feels much better after above-noted treatment, discharged with instructions to use over-the-counter analgesics as needed for pain, including headaches.  Final Clinical Impressions(s) / ED Diagnoses   Final diagnoses:  Bad headache    ED Discharge Orders    None       Dione BoozeGlick, Mouhamed Glassco, MD 02/24/18 0600

## 2018-02-24 NOTE — ED Notes (Signed)
Reviewed d/c instructions with pt, who verbalized understanding and had no outstanding questions. Requested bus pass, but none available. Pt departed in NAD, refused use of wheelchair.

## 2018-02-24 NOTE — ED Triage Notes (Signed)
Pt here for a left sided headache that he had last night and was seen for then.  No new symptoms.  A&Ox4.

## 2018-02-24 NOTE — Discharge Instructions (Signed)
Take acetaminophen and/or ibuprofen as needed for headaches.

## 2018-02-25 ENCOUNTER — Other Ambulatory Visit: Payer: Self-pay

## 2018-02-25 ENCOUNTER — Emergency Department (HOSPITAL_COMMUNITY)
Admission: EM | Admit: 2018-02-25 | Discharge: 2018-02-25 | Discharge status: Against Medical Advice | Payer: Self-pay | Attending: Emergency Medicine | Admitting: Emergency Medicine

## 2018-02-25 ENCOUNTER — Encounter (HOSPITAL_COMMUNITY): Payer: Self-pay | Admitting: Emergency Medicine

## 2018-02-25 ENCOUNTER — Emergency Department (HOSPITAL_COMMUNITY)
Admission: EM | Admit: 2018-02-25 | Discharge: 2018-02-26 | Disposition: A | Discharge status: Home / Self Care | Payer: Self-pay | Attending: Emergency Medicine | Admitting: Emergency Medicine

## 2018-02-25 DIAGNOSIS — M79604 Pain in right leg: Secondary | ICD-10-CM

## 2018-02-25 DIAGNOSIS — J45909 Unspecified asthma, uncomplicated: Secondary | ICD-10-CM | POA: Insufficient documentation

## 2018-02-25 DIAGNOSIS — F17210 Nicotine dependence, cigarettes, uncomplicated: Secondary | ICD-10-CM | POA: Insufficient documentation

## 2018-02-25 DIAGNOSIS — M25561 Pain in right knee: Secondary | ICD-10-CM | POA: Insufficient documentation

## 2018-02-25 NOTE — ED Triage Notes (Signed)
Onset today right leg and foot pain and swelling.

## 2018-02-25 NOTE — ED Provider Notes (Signed)
[facility] EMERGENCY DEPARTMENT Provider Note   CSN: [id number] Arrival date & time: 02/24/18  2348     History   Chief Complaint Chief Complaint  Patient presents with  . Leg pain-Right    HPI Dustin Norris is a 43 y.o. male.  HPI  43 year old male comes in with chief complaint of right-sided leg pain.  He has history of cocaine abuse, homelessness. He reports that he is having right-sided leg pain that started suddenly without any evoking trauma.  He has no history of similar pain in the past.  During her history, level 2 trauma came in and I informed the patient that I had to manage a very sick patient, and that I will be back after have stabilized the patient.  It appears that patient left.  Past Medical History:  Diagnosis Date  . Anxiety   . Asthma   . Cocaine abuse (HCC)   . Homelessness   . Migraine   . Tobacco abuse     Patient Active Problem List   Diagnosis Date Noted  . 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 Cocaine use disorder, moderate, dependence (HCC) 07/26/2016  . 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 Psychosis (HCC) 07/24/2016  . Chest pain 11/23/2015  . Asthma 11/23/2015  . Tobacco abuse 11/23/2015  . Elevated lactic acid level 11/23/2015  . Cocaine abuse (HCC)   . Concern about STD in male without diagnosis   . Paranoid ideation (HCC) 02/22/2015    History reviewed. No pertinent surgical history.      Home Medications    Prior to Admission medications   Not on File    Family History No family history on file.  Social History Social History   Tobacco Use  . Smoking status: Current Every Day Smoker    Packs/day: 1.00    Years: 0.00    Pack years: 0.00    Types: Cigarettes, Cigars  . Smokeless tobacco: Never Used  Substance Use Topics  . Alcohol use: Not Currently    Frequency: Never  . Drug use: Not Currently     Allergies     Haldol [haloperidol lactate]   Review of Systems Review of Systems  Constitutional: Positive for activity change.  Musculoskeletal: Positive for myalgias.     Physical Exam Updated Vital Signs BP 138/88 (BP Location: Right Arm)   Pulse 78   Temp 98.7 F (37.1 C) (Oral)   Resp 18   Ht 5\' 7"  (1.702 m)   Wt 72.6 kg   SpO2 100%   BMI 25.06 kg/m   Physical Exam Vitals signs and nursing note reviewed.  Constitutional:      Appearance: He is well-developed.  HENT:     Head: Atraumatic.  Neck:     Musculoskeletal: Neck supple.  Cardiovascular:     Rate and Rhythm: Normal rate.  Musculoskeletal:     Comments: Patient left before the exam was done in the right lower extremity  Neurological:     Mental Status: He is alert and oriented to person, place, and time.      ED Treatments / Results  Labs (all labs ordered are listed, but only abnormal results are displayed) Labs Reviewed - No data to display  EKG None  Radiology No results found.  Procedures Procedures (including critical care time)  Medications Ordered in ED Medications - No data to display   Initial Impression / Assessment and Plan / ED Course  I have reviewed the triage vital signs and the nursing notes.  Pertinent labs & imaging results that were available during my care of the patient were reviewed by me and considered in my medical decision making (see chart for details).     Patient comes in a chief complaint of right leg pain.  He reports that he started having pain earlier in the day.  He has no history of similar pain and denies any trauma.  Before the history and exam was completed, patient left.  Final Clinical Impressions(s) / ED Diagnoses   Final diagnoses:  Right leg pain    ED Discharge Orders    None       Derwood Kaplan, MD 02/25/18 [phone]

## 2018-02-25 NOTE — ED Triage Notes (Signed)
Pt reports he was here earlier today and left without being seen. Pt states he took tylenol for his headache and now his right leg is hurting. Pt reports he also wants a referral of a PCP.

## 2018-02-25 NOTE — ED Notes (Signed)
Pt returned to waiting area. 

## 2018-02-25 NOTE — ED Notes (Signed)
Pt called for room x1 with no answer

## 2018-02-25 NOTE — ED Notes (Signed)
Called for triage. Pt outside smoking. No answer up x1

## 2018-02-26 ENCOUNTER — Emergency Department (HOSPITAL_COMMUNITY)
Admission: EM | Admit: 2018-02-26 | Discharge: 2018-02-26 | Disposition: A | Discharge status: Home / Self Care | Payer: Self-pay | Attending: Emergency Medicine | Admitting: Emergency Medicine

## 2018-02-26 ENCOUNTER — Emergency Department (HOSPITAL_COMMUNITY)
Admission: EM | Admit: 2018-02-26 | Discharge: 2018-02-27 | Disposition: A | Discharge status: Home / Self Care | Payer: Self-pay | Attending: Emergency Medicine | Admitting: Emergency Medicine

## 2018-02-26 ENCOUNTER — Encounter (HOSPITAL_COMMUNITY): Payer: Self-pay | Admitting: Emergency Medicine

## 2018-02-26 ENCOUNTER — Other Ambulatory Visit: Payer: Self-pay

## 2018-02-26 DIAGNOSIS — F419 Anxiety disorder, unspecified: Secondary | ICD-10-CM | POA: Insufficient documentation

## 2018-02-26 DIAGNOSIS — Z760 Encounter for issue of repeat prescription: Secondary | ICD-10-CM | POA: Insufficient documentation

## 2018-02-26 DIAGNOSIS — J45909 Unspecified asthma, uncomplicated: Secondary | ICD-10-CM | POA: Insufficient documentation

## 2018-02-26 DIAGNOSIS — R51 Headache: Secondary | ICD-10-CM | POA: Insufficient documentation

## 2018-02-26 DIAGNOSIS — R0602 Shortness of breath: Secondary | ICD-10-CM | POA: Insufficient documentation

## 2018-02-26 DIAGNOSIS — R519 Headache, unspecified: Secondary | ICD-10-CM

## 2018-02-26 DIAGNOSIS — M25561 Pain in right knee: Secondary | ICD-10-CM | POA: Insufficient documentation

## 2018-02-26 DIAGNOSIS — Z590 Homelessness: Secondary | ICD-10-CM | POA: Insufficient documentation

## 2018-02-26 DIAGNOSIS — F1410 Cocaine abuse, uncomplicated: Secondary | ICD-10-CM | POA: Insufficient documentation

## 2018-02-26 DIAGNOSIS — Z5321 Procedure and treatment not carried out due to patient leaving prior to being seen by health care provider: Secondary | ICD-10-CM | POA: Insufficient documentation

## 2018-02-26 DIAGNOSIS — F17210 Nicotine dependence, cigarettes, uncomplicated: Secondary | ICD-10-CM | POA: Insufficient documentation

## 2018-02-26 MED ORDER — IBUPROFEN 400 MG PO TABS
400.0000 mg | ORAL_TABLET | Freq: Once | ORAL | Status: AC
  Administered 2018-02-26: 400 mg via ORAL
  Filled 2018-02-26: qty 1

## 2018-02-26 NOTE — ED Notes (Signed)
Patient verbalizes understanding of discharge instructions. Opportunity for questioning and answers were provided. Armband removed by staff, pt discharged from ED.  

## 2018-02-26 NOTE — ED Triage Notes (Signed)
Pt was asked to leave by security approx 1 hour ago because he was no longer a pt and had been sitting in waiting room for several hours.  Pt went outside when asked to leave.  Pt returns to check-in and states his friend is coming to pick him up in 45 min and he wants to check back in for a migraine to get some medication.  Denies any other complaints.

## 2018-02-26 NOTE — ED Provider Notes (Signed)
[facility] EMERGENCY DEPARTMENT Provider Note   CSN: [id number] Arrival date & time: 02/25/18  2016     History   Chief Complaint Chief Complaint  Patient presents with  . Leg Pain    HPI Dustin Norris is a 43 y.o. male with a hx of asthma, cocaine abuse, anxiety presents to the Emergency Department complaining of gradual, persistent, progressively worsening sided leg pain onset " for a while."  She reports pain is worse on walking.  No treatments prior to arrival.  He denies known injury.  He denies IV drug use.  He denies decreased range of motion, numbness, weakness in the right leg.   The history is provided by the patient and medical records. No language interpreter was used.    Past Medical History:  Diagnosis Date  . Anxiety   . Asthma   . Cocaine abuse (HCC)   . Homelessness   . Migraine   . Tobacco abuse     Patient Active Problem List   Diagnosis Date Noted  . 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 Cocaine use disorder, moderate, dependence (HCC) 07/26/2016  . 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 Psychosis (HCC) 07/24/2016  . Chest pain 11/23/2015  . Asthma 11/23/2015  . Tobacco abuse 11/23/2015  . Elevated lactic acid level 11/23/2015  . Cocaine abuse (HCC)   . Concern about STD in male without diagnosis   . Paranoid ideation (HCC) 02/22/2015    No past surgical history on file.      Home Medications    Prior to Admission medications   Not on File    Family History No family history on file.  Social History Social History   Tobacco Use  . Smoking status: Current Every Day Smoker    Packs/day: 1.00    Years: 0.00    Pack years: 0.00    Types: Cigarettes, Cigars  . Smokeless tobacco: Never Used  Substance Use Topics  . Alcohol use: Not Currently    Frequency: Never  . Drug use: Not Currently     Allergies   Haldol [haloperidol  lactate]   Review of Systems Review of Systems  Constitutional: Negative for fever.  Musculoskeletal: Positive for arthralgias.  Neurological: Negative for weakness and numbness.     Physical Exam Updated Vital Signs BP (!) 155/100   Pulse (!) 104   Temp 98.5 F (36.9 C) (Oral)   Resp 17   SpO2 100%   Physical Exam Vitals signs and nursing note reviewed.  Constitutional:      General: He is not in acute distress.    Appearance: He is well-developed.  HENT:     Head: Normocephalic.  Eyes:     General: No scleral icterus.    Conjunctiva/sclera: Conjunctivae normal.  Neck:     Musculoskeletal: Normal range of motion.  Cardiovascular:     Rate and Rhythm: Normal rate.     Comments: Tachycardic at triage but normal rate on my exam. Pulmonary:     Effort: Pulmonary effort is normal.  Musculoskeletal: Normal range of motion.     Comments: Patient indicates right knee pain when asked to show me where it hurts.  He has a full range of motion of the right knee without difficulty.  No overlying erythema, increased warmth, ecchymosis or evidence of joint effusion.  No abnormal patellar movement.  No point or joint line tenderness on exam.  Skin:    General: Skin  is warm and dry.  Neurological:     Mental Status: He is alert.     Comments: Sensation intact to normal touch in the right lower extremity.   Strength 5/5 in the right lower extremity including lection extension of the knee, flexion extension of the ankle and all toes. Normal gait without difficulty.      ED Treatments / Results   Procedures Procedures (including critical care time)  Medications Ordered in ED Medications  ibuprofen (ADVIL,MOTRIN) tablet 400 mg (has no administration in time range)     Initial Impression / Assessment and Plan / ED Course  I have reviewed the triage vital signs and the nursing notes.  Pertinent labs & imaging results that were available during my care of the patient were  reviewed by me and considered in my medical decision making (see chart for details).     Presents to the emergency department for right knee and leg pain for an unknown amount of time.  He has not been seen for this before however he is well-known to the emergency department.  He is in his usual state of health.  Question malingering.  No evidence of trauma or septic joint.  At this time I do not believe imaging is indicated.  Patient given ibuprofen.  Will discharge to home.  Discussed reasons to return to the emergency department.  Final Clinical Impressions(s) / ED Diagnoses   Final diagnoses:  Right leg pain  Right knee pain, unspecified chronicity    ED Discharge Orders    None       Mardene Sayer Boyd Kerbs 02/26/18 0112    Gilda Crease, MD 02/26/18 [phone]

## 2018-02-26 NOTE — ED Triage Notes (Signed)
Pt arrives with SOB and chest tightness  today, accidentally lost inhaler and would like another one. Lung sounds clear for [facility].

## 2018-02-26 NOTE — Discharge Instructions (Addendum)
1. Medications: alternate ibuprofen and tylenol for pain control, usual home medications 2. Treatment: rest, ice, elevate and use brace, drink plenty of fluids, gentle stretching 3. Follow Up: Please followup with orthopedics as directed or your PCP in 1 week if no improvement for discussion of your diagnoses and further evaluation after today's visit; if you do not have a primary care doctor use the resource guide provided to find one; Please return to the ER for worsening symptoms or other concerns  

## 2018-02-26 NOTE — ED Notes (Signed)
Pt states his friend is coming to pick him and is bringing his inhaler. Pt states he would like to leave.

## 2018-02-27 ENCOUNTER — Other Ambulatory Visit: Payer: Self-pay

## 2018-02-27 ENCOUNTER — Encounter (HOSPITAL_COMMUNITY): Payer: Self-pay

## 2018-02-27 ENCOUNTER — Emergency Department (HOSPITAL_COMMUNITY)
Admission: EM | Admit: 2018-02-27 | Discharge: 2018-02-28 | Discharge status: Against Medical Advice | Payer: Self-pay | Attending: Emergency Medicine | Admitting: Emergency Medicine

## 2018-02-27 ENCOUNTER — Emergency Department (HOSPITAL_COMMUNITY): Payer: Self-pay

## 2018-02-27 DIAGNOSIS — M25561 Pain in right knee: Secondary | ICD-10-CM | POA: Insufficient documentation

## 2018-02-27 DIAGNOSIS — F17210 Nicotine dependence, cigarettes, uncomplicated: Secondary | ICD-10-CM | POA: Insufficient documentation

## 2018-02-27 DIAGNOSIS — G8929 Other chronic pain: Secondary | ICD-10-CM | POA: Insufficient documentation

## 2018-02-27 DIAGNOSIS — J45909 Unspecified asthma, uncomplicated: Secondary | ICD-10-CM | POA: Insufficient documentation

## 2018-02-27 DIAGNOSIS — F17290 Nicotine dependence, other tobacco product, uncomplicated: Secondary | ICD-10-CM | POA: Insufficient documentation

## 2018-02-27 MED ORDER — IBUPROFEN 800 MG PO TABS
800.0000 mg | ORAL_TABLET | Freq: Once | ORAL | Status: AC
  Administered 2018-02-27: 800 mg via ORAL
  Filled 2018-02-27: qty 1

## 2018-02-27 NOTE — ED Provider Notes (Signed)
[facility]   CSN: [id number] Arrival date & time: 02/26/18  2220     History   Chief Complaint Chief Complaint  Patient presents with  . Migraine    HPI Dustin Norris is a 43 y.o. male with a hx of asthma, cocaine abuse, anxiety presents to the Emergency Department complaining of gradual, persistent, progressively worsening generalized headache onset " earlier today."  Patient reports the pain is throbbing in nature and rated at a 10/10.  He reports associated photophobia but denies phonophobia, nausea, vomiting, dizziness, syncope, numbness, tingling, weakness.  He additionally denies vision changes.  No treatments prior to arrival.  No specific alleviating factors.  Additionally, patient complains of his right knee hurting.  I personally evaluated him for this yesterday and record review shows that this is the third time patient has presented for this.  He continues to deny trauma, fevers, falls or known injury.  He does now report to me that he previously had surgery on this knee due to a torn ligament but this was many years ago.  He reports it is worse when he walks.  No treatments prior to arrival.  No alleviating factors.  The history is provided by the patient and medical records. No language interpreter was used.    Past Medical History:  Diagnosis Date  . Anxiety   . Asthma   . Cocaine abuse (HCC)   . Homelessness   . Migraine   . Tobacco abuse     Patient Active Problem List   Diagnosis Date Noted  . 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 Cocaine use disorder, moderate, dependence (HCC) 07/26/2016  . 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 Psychosis (HCC) 07/24/2016  . Chest pain 11/23/2015  . Asthma 11/23/2015  . Tobacco abuse 11/23/2015  . Elevated lactic acid level 11/23/2015  . Cocaine abuse (HCC)   . Concern about STD in male without  diagnosis   . Paranoid ideation (HCC) 02/22/2015    History reviewed. No pertinent surgical history.      Home Medications    Prior to Admission medications   Not on File    Family History No family history on file.  Social History Social History   Tobacco Use  . Smoking status: Current Every Day Smoker    Packs/day: 1.00    Years: 0.00    Pack years: 0.00    Types: Cigarettes, Cigars  . Smokeless tobacco: Never Used  Substance Use Topics  . Alcohol use: Not Currently    Frequency: Never  . Drug use: Not Currently     Allergies   Haldol [haloperidol lactate]   Review of Systems Review of Systems  Constitutional: Negative for appetite change, diaphoresis, fatigue, fever and unexpected weight change.  HENT: Negative for mouth sores.   Eyes: Negative for visual disturbance.  Respiratory: Negative for cough, chest tightness, shortness of breath and wheezing.   Cardiovascular: Negative for chest pain.  Gastrointestinal: Negative for abdominal pain, constipation, diarrhea, nausea and vomiting.  Endocrine: Negative for polydipsia, polyphagia and polyuria.  Genitourinary: Negative for dysuria, frequency, hematuria and urgency.  Musculoskeletal: Positive for arthralgias. Negative for back pain and neck stiffness.  Skin: Negative for rash.  Allergic/Immunologic: Negative for immunocompromised state.  Neurological: Positive for headaches. Negative for syncope and light-headedness.  Hematological: Does not bruise/bleed easily.  Psychiatric/Behavioral: Negative for sleep disturbance. The patient is not nervous/anxious.      Physical Exam  Updated Vital Signs BP (!) 150/99 (BP Location: Right Arm)   Pulse 75   Temp 98.3 F (36.8 C) (Oral)   Resp 20   SpO2 100%   Physical Exam Vitals signs and nursing note reviewed.  Constitutional:      General: He is not in acute distress.    Appearance: He is well-developed. He is not diaphoretic.  HENT:     Head:  Normocephalic and atraumatic.  Eyes:     General: No scleral icterus.    Conjunctiva/sclera: Conjunctivae normal.     Pupils: Pupils are equal, round, and reactive to light.     Comments: No horizontal, vertical or rotational nystagmus  Neck:     Musculoskeletal: Normal range of motion and neck supple.     Comments: Full active and passive ROM without pain No midline or paraspinal tenderness No nuchal rigidity or meningeal signs Cardiovascular:     Rate and Rhythm: Normal rate and regular rhythm.  Pulmonary:     Effort: Pulmonary effort is normal. No respiratory distress.     Breath sounds: Normal breath sounds. No wheezing or rales.  Abdominal:     General: Bowel sounds are normal.     Palpations: Abdomen is soft.     Tenderness: There is no abdominal tenderness. There is no guarding or rebound.  Musculoskeletal: Normal range of motion.     Comments: Full range of motion of the right knee without difficulty.  No overlying erythema, increased warmth, ecchymosis or evidence of joint effusion.  No abnormal patellar movement.  No point or joint line tenderness on exam.   Lymphadenopathy:     Cervical: No cervical adenopathy.  Skin:    General: Skin is warm and dry.     Findings: No rash.  Neurological:     Mental Status: He is alert and oriented to person, place, and time.     Cranial Nerves: No cranial nerve deficit.     Motor: No abnormal muscle tone.     Coordination: Coordination normal.     Comments: Mental Status:  Alert, oriented, thought content appropriate. Speech fluent without evidence of aphasia. Able to follow 2 step commands without difficulty.  Cranial Nerves:  II:  Peripheral visual fields grossly normal, pupils equal, round, reactive to light III,IV, VI: ptosis not present, extra-ocular motions intact bilaterally  V,VII: smile symmetric, facial light touch sensation equal VIII: hearing grossly normal bilaterally  IX,X: midline uvula rise  XI: bilateral shoulder  shrug equal and strong XII: midline tongue extension  Motor:  5/5 in upper and lower extremities bilaterally including strong and equal grip strength and dorsiflexion/plantar flexion Sensory: light touch normal in all extremities.  Cerebellar: normal finger-to-nose with bilateral upper extremities Gait: normal gait and balance CV: distal pulses palpable throughout   Psychiatric:        Behavior: Behavior normal.        Thought Content: Thought content normal.        Judgment: Judgment normal.      ED Treatments / Results   Radiology Dg Knee Complete 4 Views Right  Result Date: 02/27/2018 CLINICAL DATA:  Right knee pain. EXAM: RIGHT KNEE - COMPLETE 4+ VIEW COMPARISON:  None. FINDINGS: No evidence of fracture, dislocation, or joint effusion. Mild patellofemoral spurring. Spurring of the tibial spines. Small rounded densities projecting over the intercondylar notch may be ossified intra-articular bodies. The alignment and joint spaces are maintained. Anterior soft tissue edema. External artifact from patient's pants project over the upper  knee, patient declined to remove external artifact. IMPRESSION: 1. No acute bony abnormality.  Anterior soft tissue edema. 2. Mild patellofemoral osteoarthritis. Possible intra-articular bodies. Electronically Signed   By: Narda Rutherford M.D.   On: 02/27/2018 03:30    Procedures Procedures (including critical care time)  Medications Ordered in ED Medications  ibuprofen (ADVIL,MOTRIN) tablet 800 mg (800 mg Oral Given 02/27/18 0301)     Initial Impression / Assessment and Plan / ED Course  I have reviewed the triage vital signs and the nursing notes.  Pertinent labs & imaging results that were available during my care of the patient were reviewed by me and considered in my medical decision making (see chart for details).     Presents emergency department complaining of headache.  He is a long history of migraine headache and reports headache is  the same today.  No red flags to suggest intracranial hemorrhage, CVA or other more serious cause of his headache.  He has had numerous ED visits for similar headaches treated with acetaminophen or ibuprofen.  Neurologic exam today.  Additionally, patient is continuing to complain of right knee pain.  This is his third visit in 3 days for this complaint.  Record review shows no previous complaints for this in the past.  He denies known injury.  He states that the knee is more swollen.  I personally evaluated him for this yesterday and his exam remains unchanged.  He is without red flags for septic joint or acute fracture however as this is his third visit in 3 days, we will obtain an x-ray.  He is ambulatory here in the emergency department without difficulty or gait abnormality.  3:36 AM X-ray shows mild patellofemoral osteoarthritis and a possible intra-articular body.  Personally evaluated these images.  There are no open wounds on patient's need to suggest retained foreign body through the skin.  Patient again denies any trauma to the knee or known injury.  Patient has no previous x-rays of the right knee dating back to 2003.  Will give knee sleeve and referral to orthopedics.  Final Clinical Impressions(s) / ED Diagnoses   Final diagnoses:  Bad headache  Acute pain of right knee    ED Discharge Orders    None       Mardene Sayer Boyd Kerbs 02/27/18 8413    Ward, Layla Maw, DO 02/27/18 [phone]

## 2018-02-27 NOTE — ED Notes (Signed)
Called for triage x1, no answer 

## 2018-02-27 NOTE — ED Notes (Signed)
Pt asleep in waiting room

## 2018-02-27 NOTE — ED Triage Notes (Signed)
Pt here for right leg pain, seen here recently for same.  Still has knee sleeve on from previous visit. States pain has not changed.

## 2018-02-27 NOTE — ED Notes (Signed)
Pt was asked to change into gown for evaluation of legs and head, pt said he didn't want to because he was too cold. Pt put on monitor.

## 2018-02-27 NOTE — Discharge Instructions (Addendum)
1. Medications: alternate ibuprofen and tylenol for pain control, usual home medications 2. Treatment: rest, ice, elevate and use brace, drink plenty of fluids, gentle stretching 3. Follow Up: Please followup with orthopedics as directed or your PCP in 1 week if no improvement for discussion of your diagnoses and further evaluation after today's visit; if you do not have a primary care doctor use the resource guide provided to find one; Please return to the ER for worsening symptoms or other concerns  

## 2018-02-28 ENCOUNTER — Emergency Department (HOSPITAL_COMMUNITY)
Admission: EM | Admit: 2018-02-28 | Discharge: 2018-03-01 | Disposition: A | Discharge status: Home / Self Care | Payer: Self-pay | Attending: Emergency Medicine | Admitting: Emergency Medicine

## 2018-02-28 ENCOUNTER — Encounter (HOSPITAL_COMMUNITY): Payer: Self-pay | Admitting: Emergency Medicine

## 2018-02-28 ENCOUNTER — Other Ambulatory Visit: Payer: Self-pay

## 2018-02-28 DIAGNOSIS — R4689 Other symptoms and signs involving appearance and behavior: Secondary | ICD-10-CM | POA: Insufficient documentation

## 2018-02-28 DIAGNOSIS — Z5321 Procedure and treatment not carried out due to patient leaving prior to being seen by health care provider: Secondary | ICD-10-CM | POA: Insufficient documentation

## 2018-02-28 NOTE — ED Notes (Signed)
Pt refused vital signs.

## 2018-02-28 NOTE — ED Notes (Signed)
RN and PA went into room to introduce themselves and assess patient. Patient states that he's here d/t leg pain that started "a couple of hours ago." Pt then states "I want to be discharge, I'll just go get myself some tylenol." RN and PA attempted to express to patient that we would like to hear what's going on to try to help. Pt proceeded to get up out of bed and walk out of the emergency department with a steady gait.

## 2018-02-28 NOTE — ED Provider Notes (Signed)
[facility] EMERGENCY DEPARTMENT Provider Note   CSN: [id number] Arrival date & time: 02/27/18  2043     History   Chief Complaint Chief Complaint  Patient presents with  . Leg Pain    HPI Dustin Norris is a 43 y.o. male.  HPI   Dustin Norris is a 43 y.o. male, with a history of anxiety, asthma, homelessness, presenting to the ED with right knee pain that he states is been going on for the last several hours.  Of note, patient has been waiting in the ED for about 10 hours.  He has been seen for the same complaint several times in the last few days.  Shortly after I began to interview the patient, he states, "I am all set now.  I will just take some Tylenol and follow-up like they told me to before." He then proceeded to stand up out of the bed without noted hesitation and walked with a steady gait out of the department.   Past Medical History:  Diagnosis Date  . Anxiety   . Asthma   . Cocaine abuse (HCC)   . Homelessness   . Migraine   . Tobacco abuse     Patient Active Problem List   Diagnosis Date Noted  . 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 Cocaine use disorder, moderate, dependence (HCC) 07/26/2016  . 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 Psychosis (HCC) 07/24/2016  . Chest pain 11/23/2015  . Asthma 11/23/2015  . Tobacco abuse 11/23/2015  . Elevated lactic acid level 11/23/2015  . Cocaine abuse (HCC)   . Concern about STD in male without diagnosis   . Paranoid ideation (HCC) 02/22/2015    History reviewed. No pertinent surgical history.      Home Medications    Prior to Admission medications   Not on File    Family History History reviewed. No pertinent family history.  Social History Social History   Tobacco Use  . Smoking status: Current Every Day Smoker    Packs/day: 1.00    Years: 0.00    Pack years: 0.00    Types: Cigarettes, Cigars  .  Smokeless tobacco: Never Used  Substance Use Topics  . Alcohol use: Not Currently    Frequency: Never  . Drug use: Not Currently     Allergies   Haldol [haloperidol lactate]   Review of Systems Review of Systems  Unable to perform ROS: Other (Patient would not answer questions)  Musculoskeletal: Positive for arthralgias.     Physical Exam Updated Vital Signs BP 107/67   Pulse 70   Temp 99 F (37.2 C) (Oral)   Resp 18   SpO2 100%   Physical Exam Vitals signs and nursing note reviewed.  Constitutional:      General: He is not in acute distress.    Appearance: He is well-developed. He is not diaphoretic.     Comments: Patient would not allow exam  HENT:     Head: Normocephalic and atraumatic.  Eyes:     Conjunctiva/sclera: Conjunctivae normal.  Neck:     Musculoskeletal: Neck supple.  Cardiovascular:     Rate and Rhythm: Normal rate and regular rhythm.  Pulmonary:     Effort: Pulmonary effort is normal.  Skin:    General: Skin is warm and dry.     Coloration: Skin is not pale.  Neurological:     Mental Status: He is alert.  Psychiatric:  Behavior: Behavior normal.      ED Treatments / Results  Labs (all labs ordered are listed, but only abnormal results are displayed) Labs Reviewed - No data to display  EKG None  Radiology Dg Knee Complete 4 Views Right  Result Date: 02/27/2018 CLINICAL DATA:  Right knee pain. EXAM: RIGHT KNEE - COMPLETE 4+ VIEW COMPARISON:  None. FINDINGS: No evidence of fracture, dislocation, or joint effusion. Mild patellofemoral spurring. Spurring of the tibial spines. Small rounded densities projecting over the intercondylar notch may be ossified intra-articular bodies. The alignment and joint spaces are maintained. Anterior soft tissue edema. External artifact from patient's pants project over the upper knee, patient declined to remove external artifact. IMPRESSION: 1. No acute bony abnormality.  Anterior soft tissue edema.  2. Mild patellofemoral osteoarthritis. Possible intra-articular bodies. Electronically Signed   By: Narda Rutherford M.D.   On: 02/27/2018 03:30    Procedures Procedures (including critical care time)  Medications Ordered in ED Medications - No data to display   Initial Impression / Assessment and Plan / ED Course  I have reviewed the triage vital signs and the nursing notes.  Pertinent labs & imaging results that were available during my care of the patient were reviewed by me and considered in my medical decision making (see chart for details).     Patient presents with a complaint of right knee pain.  He did not allow for a complete interview or exam.  He was noted to be afebrile, not tachycardic, not tachypneic, not hypotensive.  He got up and walked out of the department without gait deficit.   Final Clinical Impressions(s) / ED Diagnoses   Final diagnoses:  Chronic pain of right knee    ED Discharge Orders    None       Concepcion Living 02/28/18 0818    Glynn Octave, MD 02/28/18 [phone]

## 2018-02-28 NOTE — Discharge Instructions (Addendum)
°  Follow up with a primary care provider or orthopedics on this matter.

## 2018-02-28 NOTE — ED Triage Notes (Signed)
History of chronic headaches.  Reports headache since this morning.
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
## 2018-03-01 NOTE — ED Notes (Signed)
Pt refusing vital signs and assessment at this time. Provider aware

## 2018-03-01 NOTE — ED Notes (Signed)
Patient verbalizes understanding of discharge instructions. Opportunity for questioning and answers were provided. Armband removed by staff, pt discharged from ED ambulatory.   

## 2019-01-06 IMAGING — DX DG CHEST 2V
2 series · 2 of 2 positions shown · non-contrast
Comparison: 07/12/2016.

CLINICAL DATA: Chest pain and weakness started 2 days ago. Fever at
home.

EXAM:
CHEST  2 VIEW

[x chest ap]
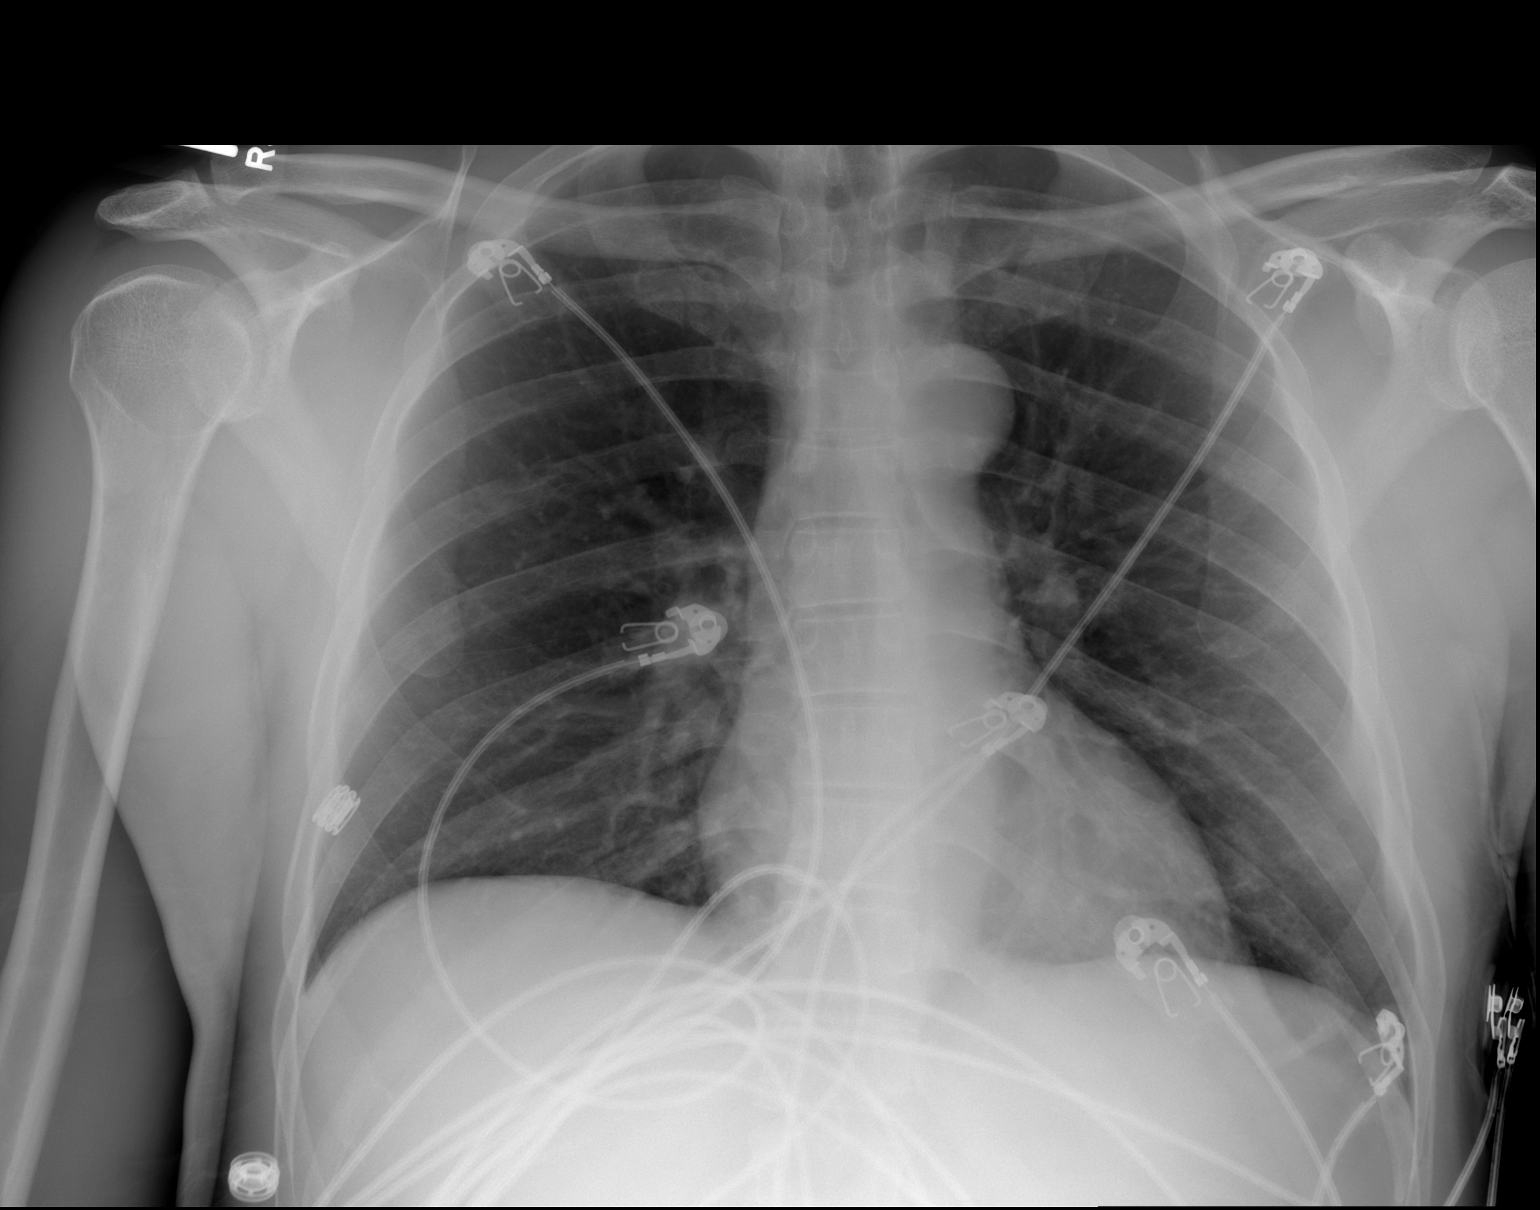

[w chest lat]
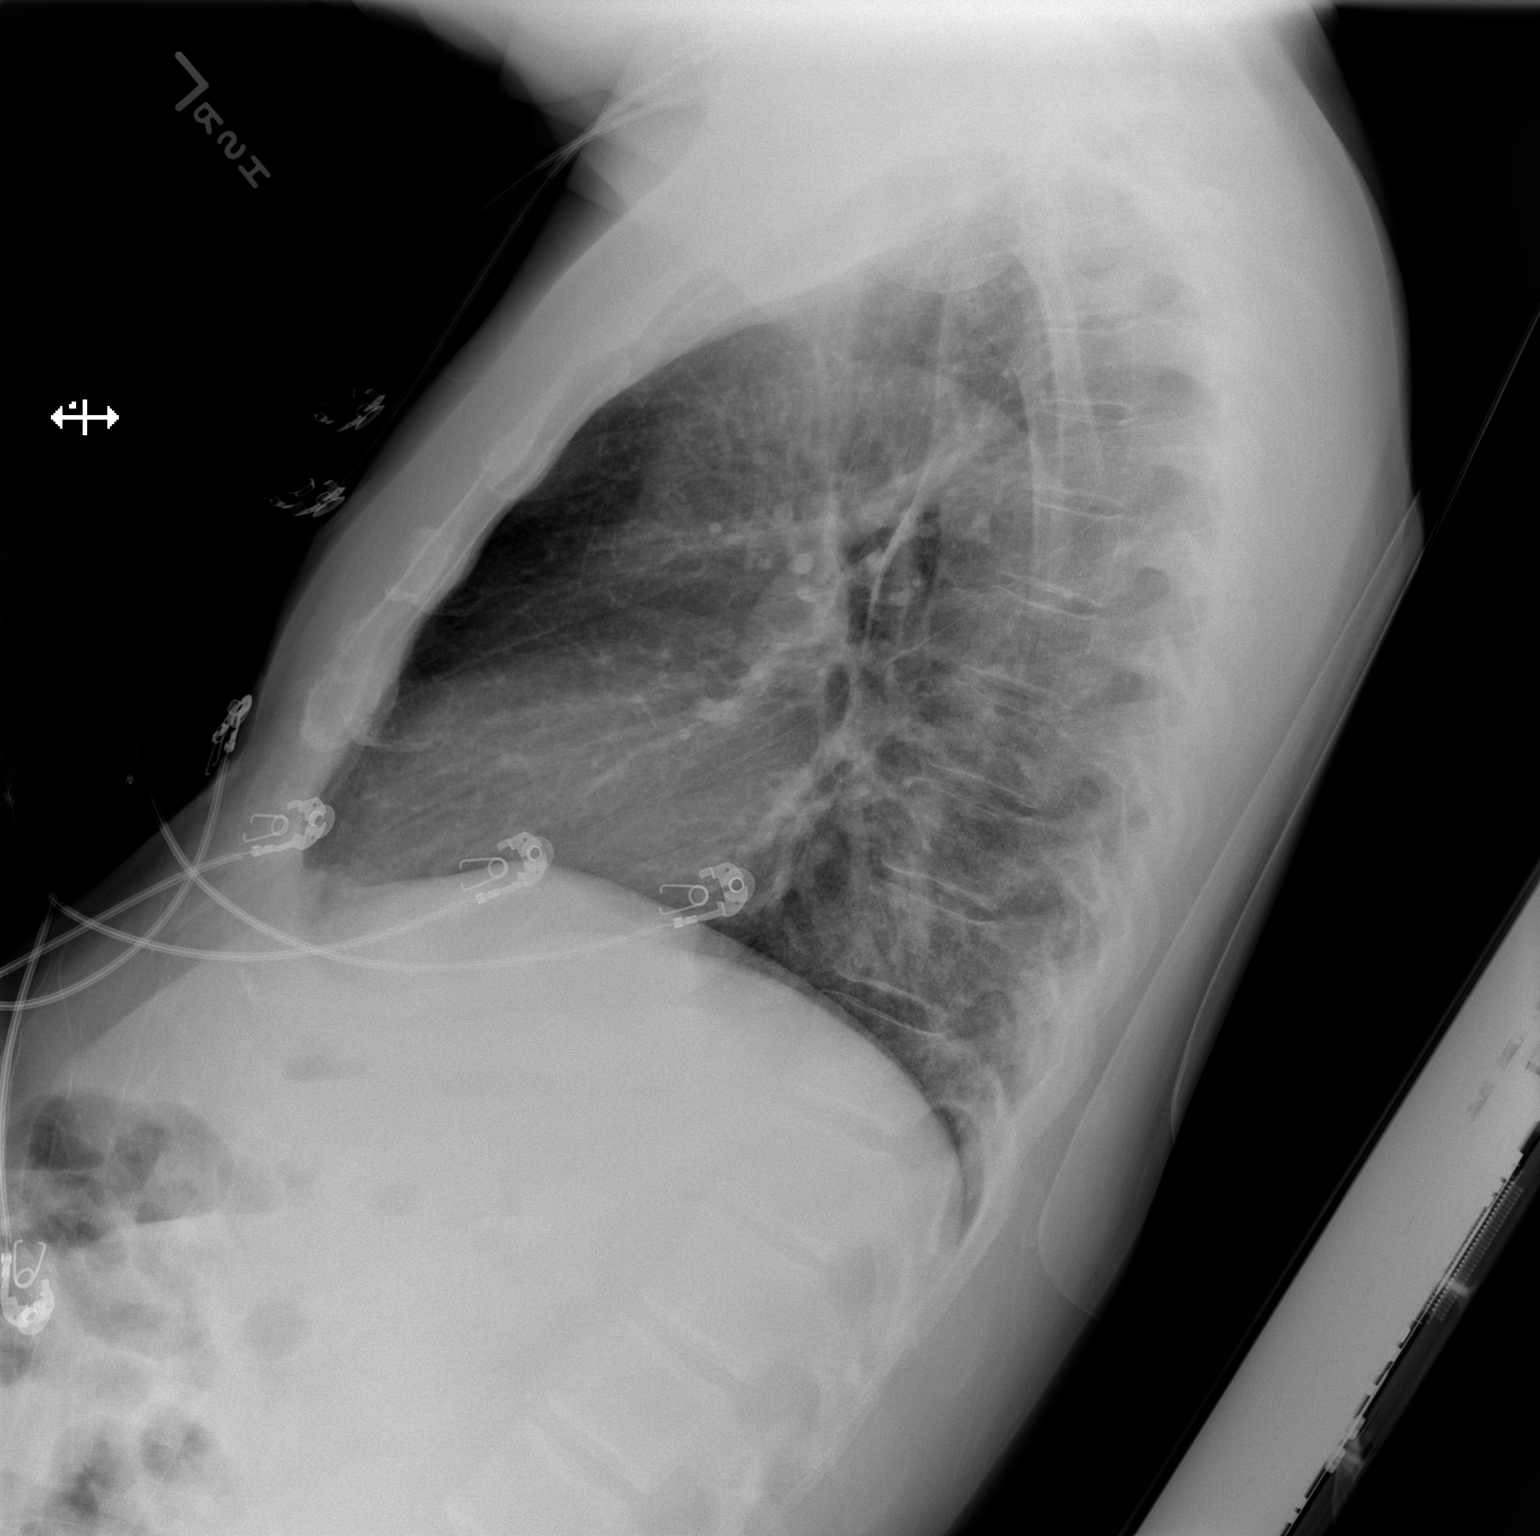

[2 of 2 positions shown; findings below may reference images not displayed]

FINDINGS: The heart size and mediastinal contours are within normal limits.
Both lungs are clear. The visualized skeletal structures are
unremarkable.
IMPRESSION: No active cardiopulmonary disease.  Stable chest.

## 2019-01-12 IMAGING — DX DG CHEST 2V
2 series · 2 of 2 positions shown · non-contrast
Comparison: Chest radiograph performed 01/25/2017

CLINICAL DATA: Acute onset of generalized chest pain and shortness
of breath.

EXAM:
CHEST  2 VIEW

[chest pa]
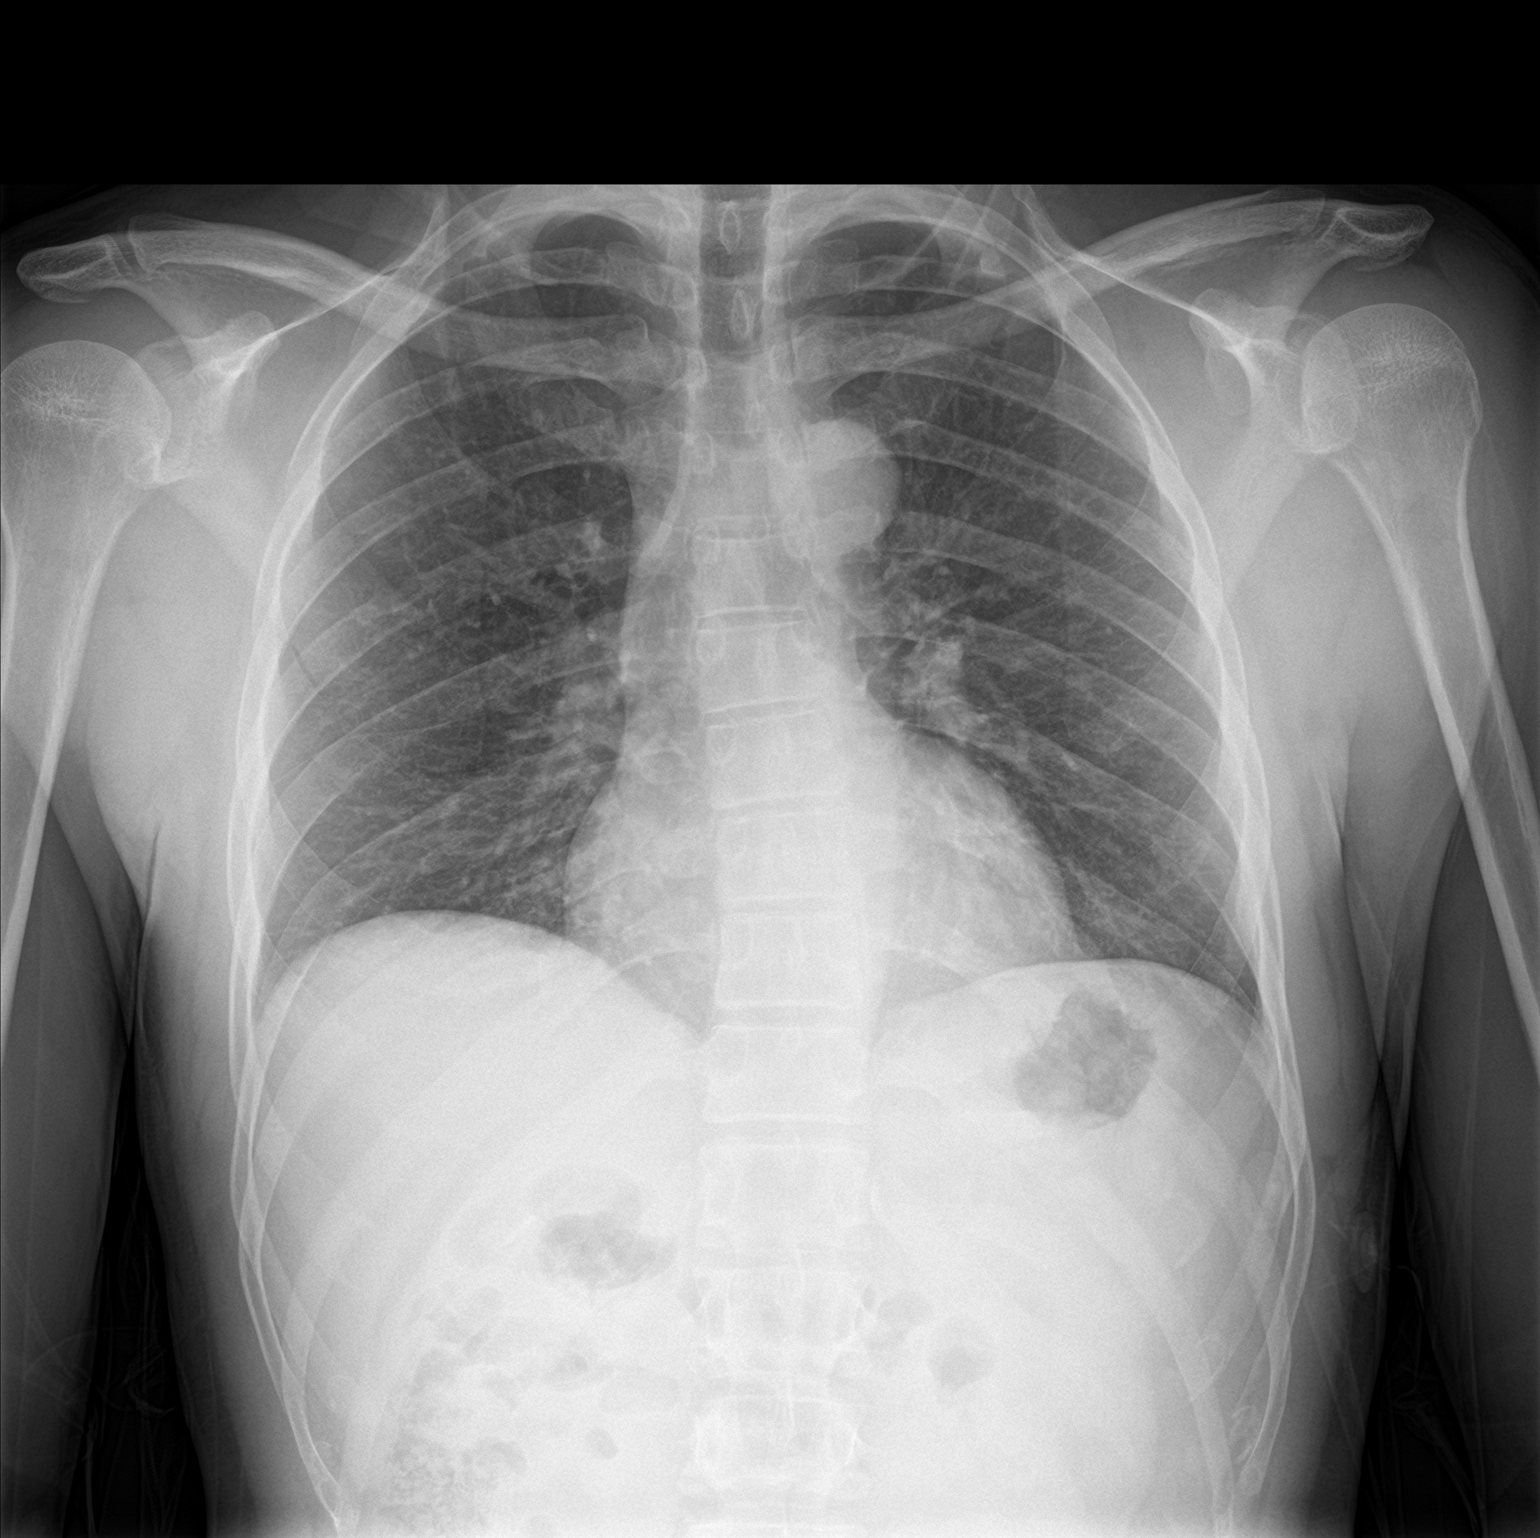

[chest lat]
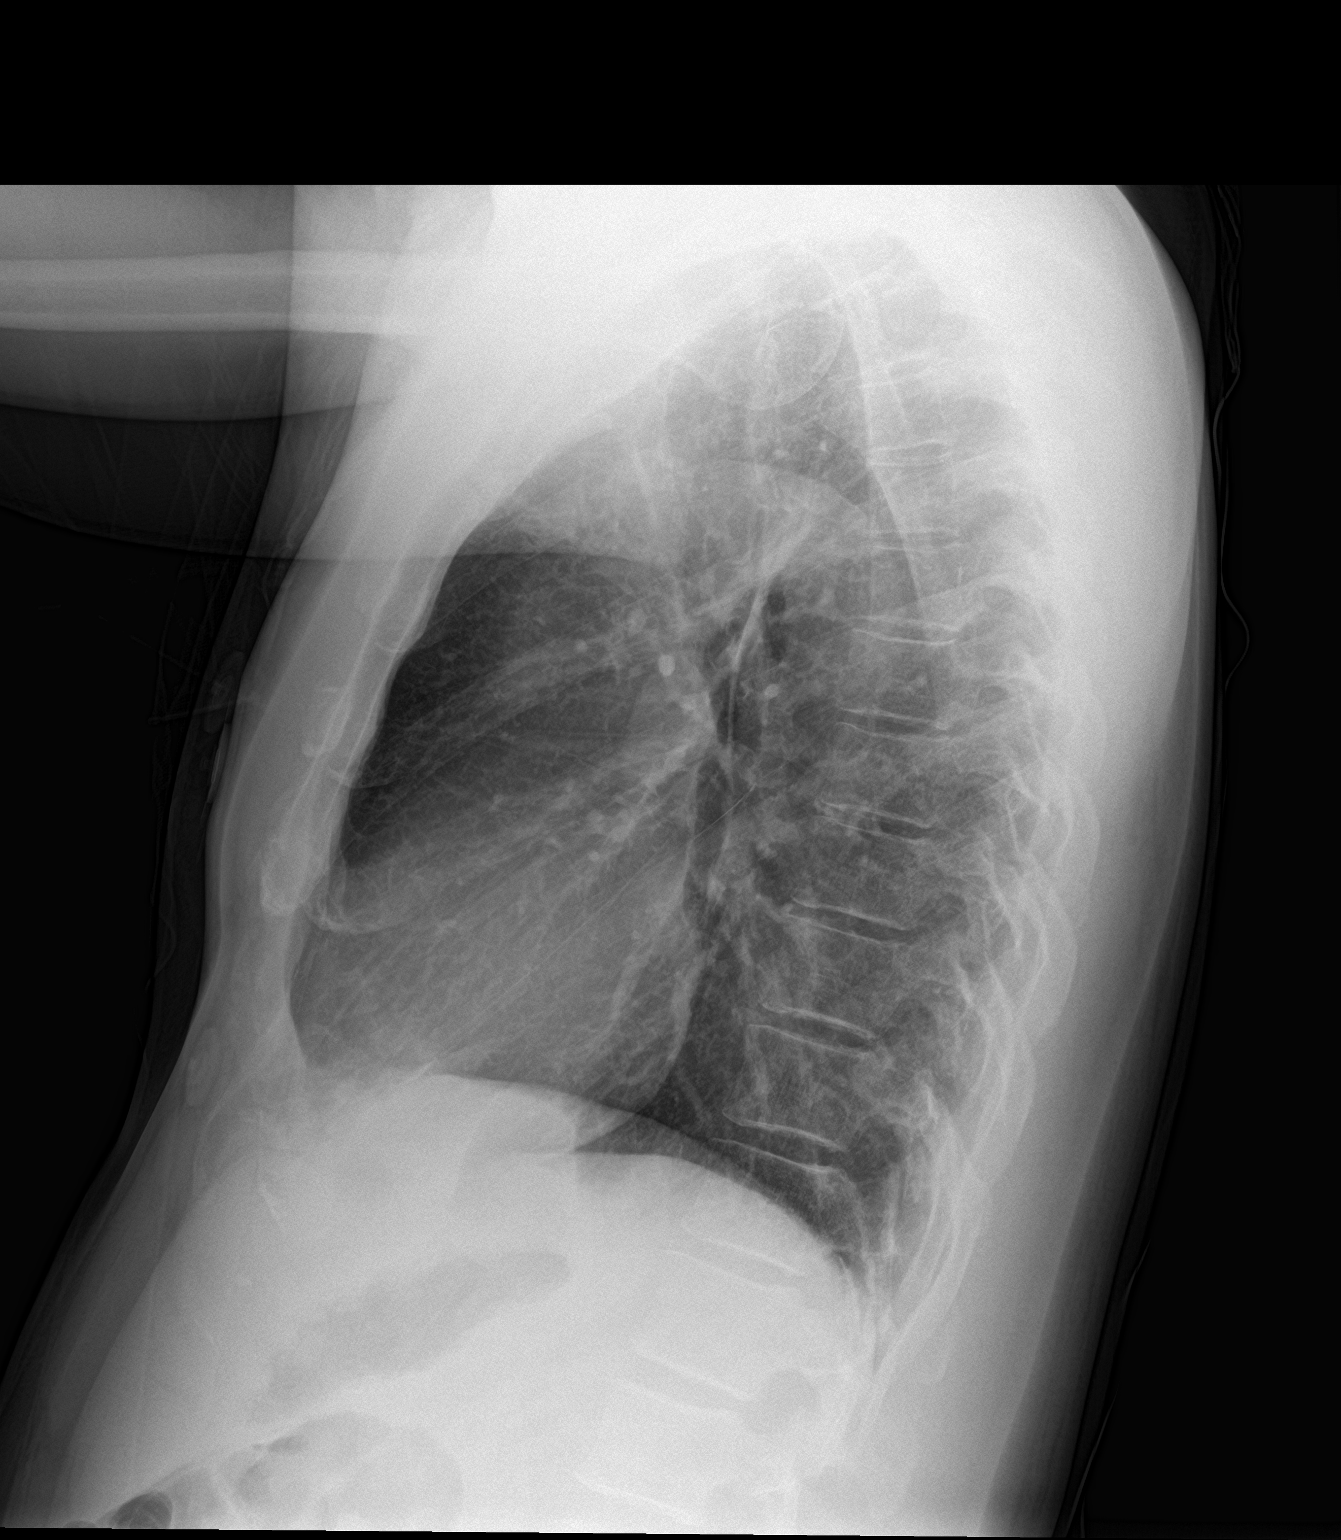

[2 of 2 positions shown; findings below may reference images not displayed]

FINDINGS: The lungs are well-aerated and clear. There is no evidence of focal
opacification, pleural effusion or pneumothorax.

The heart is normal in size; the mediastinal contour is within
normal limits. No acute osseous abnormalities are seen.
IMPRESSION: No acute cardiopulmonary process seen.

## 2019-08-14 ENCOUNTER — Other Ambulatory Visit: Payer: Self-pay

## 2019-08-14 ENCOUNTER — Emergency Department (HOSPITAL_COMMUNITY)
Admission: EM | Admit: 2019-08-14 | Discharge: 2019-08-15 | Disposition: A | Discharge status: Home / Self Care | Payer: Self-pay | Attending: Emergency Medicine | Admitting: Emergency Medicine

## 2019-08-14 DIAGNOSIS — M79671 Pain in right foot: Secondary | ICD-10-CM | POA: Insufficient documentation

## 2019-08-14 DIAGNOSIS — Z5321 Procedure and treatment not carried out due to patient leaving prior to being seen by health care provider: Secondary | ICD-10-CM | POA: Insufficient documentation

## 2019-08-14 DIAGNOSIS — M79672 Pain in left foot: Secondary | ICD-10-CM | POA: Insufficient documentation

## 2019-08-14 NOTE — ED Triage Notes (Signed)
Pt reports bilateral foot pain X2 weeks.  Denies any chest pain.

## 2019-08-15 NOTE — ED Notes (Signed)
No answer for VS x1 

## 2019-08-15 NOTE — ED Notes (Signed)
Pt stated he have to leave to go to work and he will be back at 5pm. Informed pt we recommend he stay to see a provider, the provider can give him a work note. Pt stated he understands but he just going to come back.
